# Patient Record
Sex: Female | Born: 1955 | Race: Black or African American | Hispanic: No | Marital: Single | Smoking: Never smoker
Health system: Southern US, Community
[De-identification: ages and names within clinical notes are randomized; demographics above are authoritative.]

## PROBLEM LIST (undated history)

## (undated) DIAGNOSIS — I10 Essential (primary) hypertension: Secondary | ICD-10-CM

## (undated) DIAGNOSIS — E669 Obesity, unspecified: Secondary | ICD-10-CM

## (undated) DIAGNOSIS — M1990 Unspecified osteoarthritis, unspecified site: Secondary | ICD-10-CM

## (undated) DIAGNOSIS — R519 Headache, unspecified: Secondary | ICD-10-CM

## (undated) DIAGNOSIS — M48061 Spinal stenosis, lumbar region without neurogenic claudication: Secondary | ICD-10-CM

## (undated) DIAGNOSIS — F419 Anxiety disorder, unspecified: Secondary | ICD-10-CM

## (undated) DIAGNOSIS — J449 Chronic obstructive pulmonary disease, unspecified: Secondary | ICD-10-CM

## (undated) DIAGNOSIS — F329 Major depressive disorder, single episode, unspecified: Secondary | ICD-10-CM

## (undated) DIAGNOSIS — Z973 Presence of spectacles and contact lenses: Secondary | ICD-10-CM

## (undated) DIAGNOSIS — M4316 Spondylolisthesis, lumbar region: Secondary | ICD-10-CM

## (undated) DIAGNOSIS — F32A Depression, unspecified: Secondary | ICD-10-CM

## (undated) DIAGNOSIS — I2510 Atherosclerotic heart disease of native coronary artery without angina pectoris: Secondary | ICD-10-CM

## (undated) DIAGNOSIS — R42 Dizziness and giddiness: Secondary | ICD-10-CM

## (undated) DIAGNOSIS — R51 Headache: Secondary | ICD-10-CM

## (undated) HISTORY — PX: COLONOSCOPY W/ BIOPSIES AND POLYPECTOMY: SHX1376

## (undated) HISTORY — PX: CARDIAC CATHETERIZATION: SHX172

## (undated) HISTORY — DX: Obesity, unspecified: E66.9

## (undated) HISTORY — PX: MULTIPLE TOOTH EXTRACTIONS: SHX2053

## (undated) HISTORY — PX: TUBAL LIGATION: SHX77

---

## 1998-04-09 ENCOUNTER — Encounter: Admission: RE | Admit: 1998-04-09 | Discharge: 1998-04-09 | Payer: Self-pay | Admitting: Internal Medicine

## 1998-04-28 ENCOUNTER — Emergency Department (HOSPITAL_COMMUNITY): Admission: EM | Admit: 1998-04-28 | Discharge: 1998-04-28 | Payer: Self-pay | Admitting: Emergency Medicine

## 1998-05-15 ENCOUNTER — Encounter: Admission: RE | Admit: 1998-05-15 | Discharge: 1998-05-15 | Payer: Self-pay | Admitting: Internal Medicine

## 1998-05-22 ENCOUNTER — Emergency Department (HOSPITAL_COMMUNITY): Admission: EM | Admit: 1998-05-22 | Discharge: 1998-05-22 | Payer: Self-pay | Admitting: Emergency Medicine

## 1998-06-26 ENCOUNTER — Encounter: Admission: RE | Admit: 1998-06-26 | Discharge: 1998-06-26 | Payer: Self-pay | Admitting: Hematology and Oncology

## 1998-08-18 ENCOUNTER — Emergency Department (HOSPITAL_COMMUNITY): Admission: EM | Admit: 1998-08-18 | Discharge: 1998-08-18 | Payer: Self-pay | Admitting: Emergency Medicine

## 1998-08-20 ENCOUNTER — Encounter: Admission: RE | Admit: 1998-08-20 | Discharge: 1998-08-20 | Payer: Self-pay | Admitting: Hematology and Oncology

## 1998-08-29 ENCOUNTER — Encounter: Admission: RE | Admit: 1998-08-29 | Discharge: 1998-08-29 | Payer: Self-pay | Admitting: Hematology and Oncology

## 1998-10-01 ENCOUNTER — Encounter: Admission: RE | Admit: 1998-10-01 | Discharge: 1998-10-01 | Payer: Self-pay | Admitting: Internal Medicine

## 1998-10-09 ENCOUNTER — Encounter: Admission: RE | Admit: 1998-10-09 | Discharge: 1998-10-09 | Payer: Self-pay | Admitting: Hematology and Oncology

## 1998-11-14 ENCOUNTER — Encounter: Admission: RE | Admit: 1998-11-14 | Discharge: 1998-11-14 | Payer: Self-pay | Admitting: Internal Medicine

## 1998-11-21 ENCOUNTER — Encounter: Admission: RE | Admit: 1998-11-21 | Discharge: 1998-11-21 | Payer: Self-pay | Admitting: Internal Medicine

## 1998-11-26 ENCOUNTER — Ambulatory Visit (HOSPITAL_COMMUNITY): Admission: RE | Admit: 1998-11-26 | Discharge: 1998-11-26 | Payer: Self-pay | Admitting: *Deleted

## 1998-11-26 ENCOUNTER — Encounter: Admission: RE | Admit: 1998-11-26 | Discharge: 1998-11-26 | Payer: Self-pay | Admitting: Internal Medicine

## 1998-12-18 ENCOUNTER — Emergency Department (HOSPITAL_COMMUNITY): Admission: EM | Admit: 1998-12-18 | Discharge: 1998-12-19 | Payer: Self-pay | Admitting: Emergency Medicine

## 1998-12-19 ENCOUNTER — Encounter: Payer: Self-pay | Admitting: Emergency Medicine

## 1999-02-28 ENCOUNTER — Encounter: Admission: RE | Admit: 1999-02-28 | Discharge: 1999-02-28 | Payer: Self-pay | Admitting: Internal Medicine

## 1999-05-14 ENCOUNTER — Encounter: Admission: RE | Admit: 1999-05-14 | Discharge: 1999-05-14 | Payer: Self-pay | Admitting: Internal Medicine

## 1999-06-01 ENCOUNTER — Emergency Department (HOSPITAL_COMMUNITY): Admission: EM | Admit: 1999-06-01 | Discharge: 1999-06-01 | Payer: Self-pay | Admitting: Emergency Medicine

## 1999-06-04 ENCOUNTER — Encounter: Admission: RE | Admit: 1999-06-04 | Discharge: 1999-06-04 | Payer: Self-pay | Admitting: Hematology and Oncology

## 1999-10-17 ENCOUNTER — Encounter: Admission: RE | Admit: 1999-10-17 | Discharge: 1999-10-17 | Payer: Self-pay | Admitting: Internal Medicine

## 1999-11-11 ENCOUNTER — Encounter: Payer: Self-pay | Admitting: Emergency Medicine

## 1999-11-11 ENCOUNTER — Emergency Department (HOSPITAL_COMMUNITY): Admission: EM | Admit: 1999-11-11 | Discharge: 1999-11-11 | Payer: Self-pay | Admitting: Emergency Medicine

## 2000-05-07 ENCOUNTER — Ambulatory Visit (HOSPITAL_COMMUNITY): Admission: RE | Admit: 2000-05-07 | Discharge: 2000-05-07 | Payer: Self-pay | Admitting: Hematology and Oncology

## 2000-05-07 ENCOUNTER — Encounter: Admission: RE | Admit: 2000-05-07 | Discharge: 2000-05-07 | Payer: Self-pay | Admitting: Internal Medicine

## 2000-05-28 ENCOUNTER — Ambulatory Visit (HOSPITAL_COMMUNITY): Admission: RE | Admit: 2000-05-28 | Discharge: 2000-05-28 | Payer: Self-pay | Admitting: Internal Medicine

## 2000-05-28 ENCOUNTER — Encounter: Admission: RE | Admit: 2000-05-28 | Discharge: 2000-05-28 | Payer: Self-pay | Admitting: Internal Medicine

## 2000-06-06 ENCOUNTER — Emergency Department (HOSPITAL_COMMUNITY): Admission: EM | Admit: 2000-06-06 | Discharge: 2000-06-06 | Payer: Self-pay | Admitting: Emergency Medicine

## 2000-06-10 ENCOUNTER — Encounter: Admission: RE | Admit: 2000-06-10 | Discharge: 2000-06-10 | Payer: Self-pay | Admitting: Internal Medicine

## 2000-07-20 ENCOUNTER — Encounter: Admission: RE | Admit: 2000-07-20 | Discharge: 2000-07-20 | Payer: Self-pay | Admitting: Internal Medicine

## 2001-01-26 ENCOUNTER — Encounter: Admission: RE | Admit: 2001-01-26 | Discharge: 2001-01-26 | Payer: Self-pay | Admitting: Hematology and Oncology

## 2001-08-20 ENCOUNTER — Encounter: Admission: RE | Admit: 2001-08-20 | Discharge: 2001-08-20 | Payer: Self-pay | Admitting: Internal Medicine

## 2002-02-04 ENCOUNTER — Ambulatory Visit (HOSPITAL_COMMUNITY): Admission: RE | Admit: 2002-02-04 | Discharge: 2002-02-04 | Payer: Self-pay | Admitting: *Deleted

## 2002-02-04 ENCOUNTER — Encounter: Admission: RE | Admit: 2002-02-04 | Discharge: 2002-02-04 | Payer: Self-pay | Admitting: Internal Medicine

## 2002-08-26 ENCOUNTER — Encounter: Admission: RE | Admit: 2002-08-26 | Discharge: 2002-08-26 | Payer: Self-pay | Admitting: Internal Medicine

## 2002-09-21 ENCOUNTER — Encounter: Admission: RE | Admit: 2002-09-21 | Discharge: 2002-09-21 | Payer: Self-pay | Admitting: Internal Medicine

## 2003-01-06 ENCOUNTER — Encounter: Admission: RE | Admit: 2003-01-06 | Discharge: 2003-01-06 | Payer: Self-pay | Admitting: Internal Medicine

## 2003-03-15 ENCOUNTER — Encounter: Admission: RE | Admit: 2003-03-15 | Discharge: 2003-03-15 | Payer: Self-pay | Admitting: Internal Medicine

## 2003-06-01 ENCOUNTER — Encounter: Payer: Self-pay | Admitting: Internal Medicine

## 2003-06-01 ENCOUNTER — Ambulatory Visit (HOSPITAL_COMMUNITY): Admission: RE | Admit: 2003-06-01 | Discharge: 2003-06-01 | Payer: Self-pay | Admitting: Internal Medicine

## 2003-06-01 ENCOUNTER — Encounter: Admission: RE | Admit: 2003-06-01 | Discharge: 2003-06-01 | Payer: Self-pay | Admitting: Internal Medicine

## 2003-07-31 ENCOUNTER — Encounter: Admission: RE | Admit: 2003-07-31 | Discharge: 2003-07-31 | Payer: Self-pay | Admitting: Infectious Diseases

## 2003-09-22 ENCOUNTER — Emergency Department (HOSPITAL_COMMUNITY): Admission: EM | Admit: 2003-09-22 | Discharge: 2003-09-22 | Payer: Self-pay | Admitting: Emergency Medicine

## 2003-09-29 ENCOUNTER — Emergency Department (HOSPITAL_COMMUNITY): Admission: EM | Admit: 2003-09-29 | Discharge: 2003-09-30 | Payer: Self-pay | Admitting: Emergency Medicine

## 2003-09-30 ENCOUNTER — Encounter: Payer: Self-pay | Admitting: Emergency Medicine

## 2003-10-04 ENCOUNTER — Encounter: Admission: RE | Admit: 2003-10-04 | Discharge: 2003-10-04 | Payer: Self-pay | Admitting: Internal Medicine

## 2003-10-06 ENCOUNTER — Encounter: Admission: RE | Admit: 2003-10-06 | Discharge: 2003-10-06 | Payer: Self-pay | Admitting: Internal Medicine

## 2003-10-14 ENCOUNTER — Encounter: Payer: Self-pay | Admitting: Internal Medicine

## 2003-10-14 ENCOUNTER — Ambulatory Visit (HOSPITAL_COMMUNITY): Admission: RE | Admit: 2003-10-14 | Discharge: 2003-10-14 | Payer: Self-pay | Admitting: Internal Medicine

## 2003-10-26 ENCOUNTER — Encounter: Admission: RE | Admit: 2003-10-26 | Discharge: 2003-10-26 | Payer: Self-pay | Admitting: Internal Medicine

## 2003-11-27 ENCOUNTER — Encounter: Admission: RE | Admit: 2003-11-27 | Discharge: 2003-11-27 | Payer: Self-pay | Admitting: Internal Medicine

## 2003-12-18 ENCOUNTER — Encounter: Admission: RE | Admit: 2003-12-18 | Discharge: 2003-12-18 | Payer: Self-pay | Admitting: Internal Medicine

## 2004-01-26 ENCOUNTER — Ambulatory Visit (HOSPITAL_COMMUNITY): Admission: RE | Admit: 2004-01-26 | Discharge: 2004-01-26 | Payer: Self-pay | Admitting: Neurology

## 2004-04-07 ENCOUNTER — Emergency Department (HOSPITAL_COMMUNITY): Admission: EM | Admit: 2004-04-07 | Discharge: 2004-04-07 | Payer: Self-pay | Admitting: Emergency Medicine

## 2004-08-01 ENCOUNTER — Encounter: Admission: RE | Admit: 2004-08-01 | Discharge: 2004-08-01 | Payer: Self-pay | Admitting: Internal Medicine

## 2004-08-01 ENCOUNTER — Encounter (INDEPENDENT_AMBULATORY_CARE_PROVIDER_SITE_OTHER): Payer: Self-pay | Admitting: Internal Medicine

## 2005-01-15 ENCOUNTER — Ambulatory Visit: Payer: Self-pay | Admitting: Internal Medicine

## 2005-01-16 ENCOUNTER — Encounter (INDEPENDENT_AMBULATORY_CARE_PROVIDER_SITE_OTHER): Payer: Self-pay | Admitting: Internal Medicine

## 2005-01-16 LAB — CONVERTED CEMR LAB: Microalbumin U total vol: 0.94 mg/L

## 2005-04-18 ENCOUNTER — Emergency Department (HOSPITAL_COMMUNITY): Admission: EM | Admit: 2005-04-18 | Discharge: 2005-04-18 | Payer: Self-pay | Admitting: Emergency Medicine

## 2005-05-27 ENCOUNTER — Ambulatory Visit: Payer: Self-pay | Admitting: Internal Medicine

## 2005-06-12 ENCOUNTER — Ambulatory Visit: Payer: Self-pay | Admitting: Internal Medicine

## 2005-07-07 ENCOUNTER — Ambulatory Visit: Payer: Self-pay

## 2005-07-25 ENCOUNTER — Ambulatory Visit: Payer: Self-pay | Admitting: Internal Medicine

## 2005-08-07 ENCOUNTER — Ambulatory Visit: Payer: Self-pay | Admitting: Internal Medicine

## 2005-09-10 ENCOUNTER — Ambulatory Visit (HOSPITAL_COMMUNITY): Admission: RE | Admit: 2005-09-10 | Discharge: 2005-09-10 | Payer: Self-pay | Admitting: Internal Medicine

## 2006-03-13 ENCOUNTER — Ambulatory Visit: Payer: Self-pay | Admitting: Internal Medicine

## 2006-04-10 ENCOUNTER — Ambulatory Visit: Payer: Self-pay | Admitting: Internal Medicine

## 2006-07-08 ENCOUNTER — Emergency Department (HOSPITAL_COMMUNITY): Admission: EM | Admit: 2006-07-08 | Discharge: 2006-07-09 | Payer: Self-pay | Admitting: Emergency Medicine

## 2006-07-15 ENCOUNTER — Ambulatory Visit: Payer: Self-pay | Admitting: Hospitalist

## 2006-08-26 ENCOUNTER — Emergency Department (HOSPITAL_COMMUNITY): Admission: EM | Admit: 2006-08-26 | Discharge: 2006-08-27 | Payer: Self-pay | Admitting: Emergency Medicine

## 2006-10-12 ENCOUNTER — Encounter (INDEPENDENT_AMBULATORY_CARE_PROVIDER_SITE_OTHER): Payer: Self-pay | Admitting: Internal Medicine

## 2006-10-12 DIAGNOSIS — F411 Generalized anxiety disorder: Secondary | ICD-10-CM | POA: Insufficient documentation

## 2006-10-12 DIAGNOSIS — G4700 Insomnia, unspecified: Secondary | ICD-10-CM | POA: Insufficient documentation

## 2006-10-12 DIAGNOSIS — H8110 Benign paroxysmal vertigo, unspecified ear: Secondary | ICD-10-CM | POA: Insufficient documentation

## 2006-10-12 DIAGNOSIS — K029 Dental caries, unspecified: Secondary | ICD-10-CM | POA: Insufficient documentation

## 2006-10-12 DIAGNOSIS — I10 Essential (primary) hypertension: Secondary | ICD-10-CM | POA: Insufficient documentation

## 2006-11-27 ENCOUNTER — Ambulatory Visit: Payer: Self-pay | Admitting: Internal Medicine

## 2006-11-30 ENCOUNTER — Encounter: Payer: Self-pay | Admitting: Internal Medicine

## 2006-11-30 ENCOUNTER — Ambulatory Visit: Payer: Self-pay | Admitting: Internal Medicine

## 2006-12-01 ENCOUNTER — Encounter (INDEPENDENT_AMBULATORY_CARE_PROVIDER_SITE_OTHER): Payer: Self-pay | Admitting: Internal Medicine

## 2006-12-01 LAB — CONVERTED CEMR LAB: TSH: 2.163 microintl units/mL

## 2007-01-13 ENCOUNTER — Emergency Department (HOSPITAL_COMMUNITY): Admission: EM | Admit: 2007-01-13 | Discharge: 2007-01-14 | Payer: Self-pay | Admitting: Emergency Medicine

## 2007-01-22 ENCOUNTER — Telehealth: Payer: Self-pay | Admitting: *Deleted

## 2007-01-23 LAB — CONVERTED CEMR LAB
ALT: 13 units/L (ref 0–35)
AST: 21 units/L (ref 0–37)
Albumin: 4.5 g/dL (ref 3.5–5.2)
Alkaline Phosphatase: 89 units/L (ref 39–117)
BUN: 11 mg/dL (ref 6–23)
Basophils Absolute: 0 10*3/uL (ref 0.0–0.1)
Basophils Relative: 1 % (ref 0–1)
CO2: 24 meq/L (ref 19–32)
Calcium: 9.8 mg/dL (ref 8.4–10.5)
Chloride: 100 meq/L (ref 96–112)
Creatinine, Ser: 0.88 mg/dL (ref 0.40–1.20)
Eosinophils Relative: 2 % (ref 0–4)
Glucose, Bld: 81 mg/dL (ref 70–99)
HCT: 38.9 % (ref 34.4–43.3)
Hemoglobin: 13 g/dL (ref 11.7–14.8)
Lymphocytes Relative: 37 % (ref 15–43)
Lymphs Abs: 1.6 10*3/uL (ref 0.8–3.1)
MCHC: 33.4 g/dL (ref 33.1–35.4)
MCV: 84.4 fL (ref 78.8–100.0)
Monocytes Absolute: 0.4 10*3/uL (ref 0.2–0.7)
Monocytes Relative: 9 % (ref 3–11)
Neutro Abs: 2.3 10*3/uL (ref 1.8–6.8)
Neutrophils Relative %: 52 % (ref 47–77)
Platelets: 318 10*3/uL (ref 152–374)
Potassium: 3.4 meq/L — ABNORMAL LOW (ref 3.5–5.3)
RBC: 4.61 M/uL (ref 3.79–4.96)
RDW: 14 % (ref 11.5–15.3)
Sodium: 137 meq/L (ref 135–145)
TSH: 2.163 microintl units/mL (ref 0.350–5.50)
Total Bilirubin: 0.4 mg/dL (ref 0.3–1.2)
Total Protein: 8.4 g/dL — ABNORMAL HIGH (ref 6.0–8.3)
WBC: 4.4 10*3/uL (ref 3.7–10.0)

## 2007-02-27 ENCOUNTER — Inpatient Hospital Stay (HOSPITAL_COMMUNITY): Admission: EM | Admit: 2007-02-27 | Discharge: 2007-03-02 | Payer: Self-pay | Admitting: Emergency Medicine

## 2007-02-27 ENCOUNTER — Ambulatory Visit: Payer: Self-pay | Admitting: Infectious Diseases

## 2007-02-27 DIAGNOSIS — I2510 Atherosclerotic heart disease of native coronary artery without angina pectoris: Secondary | ICD-10-CM | POA: Insufficient documentation

## 2007-03-01 ENCOUNTER — Ambulatory Visit: Payer: Self-pay | Admitting: Vascular Surgery

## 2007-03-02 ENCOUNTER — Encounter: Payer: Self-pay | Admitting: Internal Medicine

## 2007-03-02 ENCOUNTER — Telehealth: Payer: Self-pay | Admitting: *Deleted

## 2007-03-09 ENCOUNTER — Emergency Department (HOSPITAL_COMMUNITY): Admission: EM | Admit: 2007-03-09 | Discharge: 2007-03-09 | Payer: Self-pay | Admitting: Emergency Medicine

## 2007-03-10 ENCOUNTER — Ambulatory Visit: Payer: Self-pay | Admitting: *Deleted

## 2007-03-10 ENCOUNTER — Encounter (INDEPENDENT_AMBULATORY_CARE_PROVIDER_SITE_OTHER): Payer: Self-pay | Admitting: *Deleted

## 2007-03-10 DIAGNOSIS — E785 Hyperlipidemia, unspecified: Secondary | ICD-10-CM | POA: Insufficient documentation

## 2007-03-14 ENCOUNTER — Emergency Department (HOSPITAL_COMMUNITY): Admission: EM | Admit: 2007-03-14 | Discharge: 2007-03-15 | Payer: Self-pay | Admitting: Emergency Medicine

## 2007-03-22 ENCOUNTER — Telehealth: Payer: Self-pay | Admitting: *Deleted

## 2007-04-15 ENCOUNTER — Telehealth (INDEPENDENT_AMBULATORY_CARE_PROVIDER_SITE_OTHER): Payer: Self-pay | Admitting: Internal Medicine

## 2007-05-09 ENCOUNTER — Emergency Department (HOSPITAL_COMMUNITY): Admission: EM | Admit: 2007-05-09 | Discharge: 2007-05-09 | Payer: Self-pay | Admitting: Emergency Medicine

## 2007-05-11 ENCOUNTER — Encounter (INDEPENDENT_AMBULATORY_CARE_PROVIDER_SITE_OTHER): Payer: Self-pay | Admitting: Internal Medicine

## 2007-05-11 ENCOUNTER — Ambulatory Visit: Payer: Self-pay | Admitting: Internal Medicine

## 2007-05-13 LAB — CONVERTED CEMR LAB
ALT: 13 units/L (ref 0–35)
AST: 22 units/L (ref 0–37)
Albumin: 4.6 g/dL (ref 3.5–5.2)
Alkaline Phosphatase: 80 units/L (ref 39–117)
BUN: 7 mg/dL (ref 6–23)
Basophils Absolute: 0 10*3/uL (ref 0.0–0.1)
Basophils Relative: 1 % (ref 0–1)
Bilirubin Urine: NEGATIVE
CO2: 22 meq/L (ref 19–32)
Calcium: 10 mg/dL (ref 8.4–10.5)
Chloride: 104 meq/L (ref 96–112)
Creatinine, Ser: 0.82 mg/dL (ref 0.40–1.20)
Eosinophils Absolute: 0.1 10*3/uL (ref 0.0–0.7)
Eosinophils Relative: 1 % (ref 0–5)
Free T4: 0.85 ng/dL — ABNORMAL LOW (ref 0.89–1.80)
Glucose, Bld: 83 mg/dL (ref 70–99)
HCT: 41 % (ref 36.0–46.0)
Hemoglobin, Urine: NEGATIVE
Hemoglobin: 13.3 g/dL (ref 12.0–15.0)
Ketones, ur: NEGATIVE mg/dL
Lymphocytes Relative: 40 % (ref 12–46)
Lymphs Abs: 1.8 10*3/uL (ref 0.7–3.3)
MCHC: 32.4 g/dL (ref 30.0–36.0)
MCV: 82.7 fL (ref 78.0–100.0)
Monocytes Absolute: 0.4 10*3/uL (ref 0.2–0.7)
Monocytes Relative: 9 % (ref 3–11)
Neutro Abs: 2.2 10*3/uL (ref 1.7–7.7)
Neutrophils Relative %: 49 % (ref 43–77)
Nitrite: NEGATIVE
Platelets: 317 10*3/uL (ref 150–400)
Potassium: 3.5 meq/L (ref 3.5–5.3)
Protein, ur: NEGATIVE mg/dL
RBC / HPF: NONE SEEN (ref <3)
RBC: 4.96 M/uL (ref 3.87–5.11)
RDW: 13.9 % (ref 11.5–14.0)
Sodium: 139 meq/L (ref 135–145)
Specific Gravity, Urine: 1.011 (ref 1.005–1.03)
TSH: 1.777 microintl units/mL (ref 0.350–5.50)
Total Bilirubin: 0.4 mg/dL (ref 0.3–1.2)
Total Protein: 8.9 g/dL — ABNORMAL HIGH (ref 6.0–8.3)
Urine Glucose: NEGATIVE mg/dL
Urobilinogen, UA: 1 (ref 0.0–1.0)
WBC: 4.4 10*3/uL (ref 4.0–10.5)
pH: 6 (ref 5.0–8.0)

## 2007-05-14 ENCOUNTER — Encounter (INDEPENDENT_AMBULATORY_CARE_PROVIDER_SITE_OTHER): Payer: Self-pay | Admitting: Internal Medicine

## 2007-05-20 ENCOUNTER — Telehealth (INDEPENDENT_AMBULATORY_CARE_PROVIDER_SITE_OTHER): Payer: Self-pay | Admitting: Internal Medicine

## 2007-05-22 ENCOUNTER — Emergency Department (HOSPITAL_COMMUNITY): Admission: EM | Admit: 2007-05-22 | Discharge: 2007-05-23 | Payer: Self-pay | Admitting: Emergency Medicine

## 2007-05-26 ENCOUNTER — Emergency Department (HOSPITAL_COMMUNITY): Admission: EM | Admit: 2007-05-26 | Discharge: 2007-05-26 | Payer: Self-pay | Admitting: Family Medicine

## 2007-05-26 ENCOUNTER — Telehealth: Payer: Self-pay | Admitting: *Deleted

## 2007-05-27 ENCOUNTER — Ambulatory Visit: Payer: Self-pay | Admitting: Internal Medicine

## 2007-05-27 DIAGNOSIS — G43909 Migraine, unspecified, not intractable, without status migrainosus: Secondary | ICD-10-CM | POA: Insufficient documentation

## 2007-06-09 ENCOUNTER — Telehealth (INDEPENDENT_AMBULATORY_CARE_PROVIDER_SITE_OTHER): Payer: Self-pay | Admitting: *Deleted

## 2007-06-10 ENCOUNTER — Emergency Department (HOSPITAL_COMMUNITY): Admission: EM | Admit: 2007-06-10 | Discharge: 2007-06-10 | Payer: Self-pay | Admitting: Family Medicine

## 2007-06-11 ENCOUNTER — Ambulatory Visit: Payer: Self-pay | Admitting: Internal Medicine

## 2007-06-11 ENCOUNTER — Encounter (INDEPENDENT_AMBULATORY_CARE_PROVIDER_SITE_OTHER): Payer: Self-pay | Admitting: Internal Medicine

## 2007-06-14 LAB — CONVERTED CEMR LAB
BUN: 9 mg/dL (ref 6–23)
CO2: 26 meq/L (ref 19–32)
Calcium: 9.9 mg/dL (ref 8.4–10.5)
Chloride: 100 meq/L (ref 96–112)
Creatinine, Ser: 0.82 mg/dL (ref 0.40–1.20)
Glucose, Bld: 120 mg/dL — ABNORMAL HIGH (ref 70–99)
Potassium: 3.3 meq/L — ABNORMAL LOW (ref 3.5–5.3)
Sodium: 138 meq/L (ref 135–145)

## 2007-06-15 ENCOUNTER — Telehealth: Payer: Self-pay | Admitting: *Deleted

## 2007-06-21 ENCOUNTER — Telehealth: Payer: Self-pay | Admitting: *Deleted

## 2007-06-26 ENCOUNTER — Telehealth: Payer: Self-pay | Admitting: *Deleted

## 2007-07-06 ENCOUNTER — Telehealth: Payer: Self-pay | Admitting: *Deleted

## 2007-07-07 ENCOUNTER — Ambulatory Visit: Payer: Self-pay | Admitting: *Deleted

## 2007-07-07 ENCOUNTER — Encounter (INDEPENDENT_AMBULATORY_CARE_PROVIDER_SITE_OTHER): Payer: Self-pay | Admitting: Internal Medicine

## 2007-07-07 LAB — CONVERTED CEMR LAB
Bacteria, UA: NONE SEEN
Bilirubin Urine: NEGATIVE
Bilirubin Urine: NEGATIVE
Blood in Urine, dipstick: NEGATIVE
Candida species: NEGATIVE
Chlamydia, DNA Probe: NEGATIVE
GC Probe Amp, Genital: NEGATIVE
Gardnerella vaginalis: POSITIVE — AB
Glucose, Urine, Semiquant: NEGATIVE
Hemoglobin, Urine: NEGATIVE
Ketones, ur: NEGATIVE mg/dL
Ketones, urine, test strip: NEGATIVE
Nitrite: NEGATIVE
Nitrite: NEGATIVE
Protein, U semiquant: NEGATIVE
Protein, ur: NEGATIVE mg/dL
RBC / HPF: NONE SEEN (ref <3)
Specific Gravity, Urine: 1.01
Specific Gravity, Urine: 1.007 (ref 1.005–1.03)
Trichomonal Vaginitis: NEGATIVE
Urine Glucose: NEGATIVE mg/dL
Urobilinogen, UA: 0.2
Urobilinogen, UA: 0.2 (ref 0.0–1.0)
pH: 5
pH: 6 (ref 5.0–8.0)

## 2007-07-08 ENCOUNTER — Encounter (INDEPENDENT_AMBULATORY_CARE_PROVIDER_SITE_OTHER): Payer: Self-pay | Admitting: Internal Medicine

## 2007-07-08 ENCOUNTER — Telehealth (INDEPENDENT_AMBULATORY_CARE_PROVIDER_SITE_OTHER): Payer: Self-pay | Admitting: Internal Medicine

## 2007-07-08 LAB — CONVERTED CEMR LAB

## 2007-07-12 ENCOUNTER — Telehealth (INDEPENDENT_AMBULATORY_CARE_PROVIDER_SITE_OTHER): Payer: Self-pay | Admitting: Internal Medicine

## 2007-07-15 ENCOUNTER — Telehealth (INDEPENDENT_AMBULATORY_CARE_PROVIDER_SITE_OTHER): Payer: Self-pay | Admitting: *Deleted

## 2007-07-16 ENCOUNTER — Telehealth: Payer: Self-pay | Admitting: Internal Medicine

## 2007-08-20 ENCOUNTER — Telehealth (INDEPENDENT_AMBULATORY_CARE_PROVIDER_SITE_OTHER): Payer: Self-pay | Admitting: *Deleted

## 2007-09-16 ENCOUNTER — Telehealth (INDEPENDENT_AMBULATORY_CARE_PROVIDER_SITE_OTHER): Payer: Self-pay | Admitting: *Deleted

## 2007-10-18 ENCOUNTER — Telehealth: Payer: Self-pay | Admitting: *Deleted

## 2007-10-19 ENCOUNTER — Encounter (INDEPENDENT_AMBULATORY_CARE_PROVIDER_SITE_OTHER): Payer: Self-pay | Admitting: Internal Medicine

## 2007-10-19 ENCOUNTER — Ambulatory Visit: Payer: Self-pay | Admitting: Internal Medicine

## 2007-10-19 LAB — CONVERTED CEMR LAB
ALT: 13 units/L (ref 0–35)
AST: 22 units/L (ref 0–37)
Albumin: 4.4 g/dL (ref 3.5–5.2)
Alkaline Phosphatase: 73 units/L (ref 39–117)
BUN: 12 mg/dL (ref 6–23)
CO2: 24 meq/L (ref 19–32)
Calcium: 9.8 mg/dL (ref 8.4–10.5)
Chloride: 102 meq/L (ref 96–112)
Cholesterol: 220 mg/dL — ABNORMAL HIGH (ref 0–200)
Creatinine, Ser: 0.95 mg/dL (ref 0.40–1.20)
Glucose, Bld: 91 mg/dL (ref 70–99)
HDL: 40 mg/dL (ref >39)
LDL Cholesterol: 113 mg/dL — ABNORMAL HIGH (ref 0–99)
Potassium: 3.6 meq/L (ref 3.5–5.3)
Sodium: 140 meq/L (ref 135–145)
Total Bilirubin: 0.3 mg/dL (ref 0.3–1.2)
Total CHOL/HDL Ratio: 5.5
Total CK: 72 units/L (ref 7–177)
Total Protein: 8.4 g/dL — ABNORMAL HIGH (ref 6.0–8.3)
Triglycerides: 334 mg/dL — ABNORMAL HIGH (ref <150)
VLDL: 67 mg/dL — ABNORMAL HIGH (ref 0–40)

## 2007-10-24 ENCOUNTER — Encounter (INDEPENDENT_AMBULATORY_CARE_PROVIDER_SITE_OTHER): Payer: Self-pay | Admitting: Internal Medicine

## 2007-10-26 ENCOUNTER — Telehealth (INDEPENDENT_AMBULATORY_CARE_PROVIDER_SITE_OTHER): Payer: Self-pay | Admitting: Internal Medicine

## 2007-10-27 ENCOUNTER — Telehealth: Payer: Self-pay | Admitting: *Deleted

## 2007-11-15 ENCOUNTER — Ambulatory Visit: Payer: Self-pay | Admitting: Internal Medicine

## 2007-11-16 ENCOUNTER — Encounter: Payer: Self-pay | Admitting: Internal Medicine

## 2007-11-17 ENCOUNTER — Inpatient Hospital Stay (HOSPITAL_COMMUNITY): Admission: EM | Admit: 2007-11-17 | Discharge: 2007-11-18 | Payer: Self-pay | Admitting: Emergency Medicine

## 2007-11-18 ENCOUNTER — Encounter (INDEPENDENT_AMBULATORY_CARE_PROVIDER_SITE_OTHER): Payer: Self-pay | Admitting: Internal Medicine

## 2007-11-23 ENCOUNTER — Telehealth (INDEPENDENT_AMBULATORY_CARE_PROVIDER_SITE_OTHER): Payer: Self-pay | Admitting: *Deleted

## 2007-11-26 ENCOUNTER — Telehealth (INDEPENDENT_AMBULATORY_CARE_PROVIDER_SITE_OTHER): Payer: Self-pay | Admitting: Internal Medicine

## 2007-11-30 ENCOUNTER — Ambulatory Visit (HOSPITAL_COMMUNITY): Admission: RE | Admit: 2007-11-30 | Discharge: 2007-11-30 | Payer: Self-pay | Admitting: Cardiology

## 2007-11-30 ENCOUNTER — Telehealth (INDEPENDENT_AMBULATORY_CARE_PROVIDER_SITE_OTHER): Payer: Self-pay | Admitting: Internal Medicine

## 2007-12-18 ENCOUNTER — Telehealth: Payer: Self-pay | Admitting: Internal Medicine

## 2008-01-01 ENCOUNTER — Emergency Department (HOSPITAL_COMMUNITY): Admission: EM | Admit: 2008-01-01 | Discharge: 2008-01-01 | Payer: Self-pay | Admitting: Emergency Medicine

## 2008-01-07 ENCOUNTER — Ambulatory Visit: Payer: Self-pay | Admitting: Internal Medicine

## 2008-01-10 ENCOUNTER — Telehealth: Payer: Self-pay | Admitting: Licensed Clinical Social Worker

## 2008-01-19 ENCOUNTER — Telehealth: Payer: Self-pay | Admitting: Internal Medicine

## 2008-01-22 ENCOUNTER — Emergency Department (HOSPITAL_COMMUNITY): Admission: EM | Admit: 2008-01-22 | Discharge: 2008-01-22 | Payer: Self-pay | Admitting: Emergency Medicine

## 2008-02-03 ENCOUNTER — Emergency Department (HOSPITAL_COMMUNITY): Admission: EM | Admit: 2008-02-03 | Discharge: 2008-02-03 | Payer: Self-pay | Admitting: Family Medicine

## 2008-02-13 ENCOUNTER — Emergency Department (HOSPITAL_COMMUNITY): Admission: EM | Admit: 2008-02-13 | Discharge: 2008-02-14 | Payer: Self-pay | Admitting: Emergency Medicine

## 2008-02-19 ENCOUNTER — Emergency Department (HOSPITAL_COMMUNITY): Admission: EM | Admit: 2008-02-19 | Discharge: 2008-02-20 | Payer: Self-pay | Admitting: Family Medicine

## 2008-03-05 ENCOUNTER — Emergency Department (HOSPITAL_COMMUNITY): Admission: EM | Admit: 2008-03-05 | Discharge: 2008-03-05 | Payer: Self-pay | Admitting: Emergency Medicine

## 2008-03-13 ENCOUNTER — Emergency Department (HOSPITAL_COMMUNITY): Admission: EM | Admit: 2008-03-13 | Discharge: 2008-03-14 | Payer: Self-pay | Admitting: Emergency Medicine

## 2008-03-15 ENCOUNTER — Telehealth (INDEPENDENT_AMBULATORY_CARE_PROVIDER_SITE_OTHER): Payer: Self-pay | Admitting: Internal Medicine

## 2008-03-19 ENCOUNTER — Telehealth: Payer: Self-pay | Admitting: *Deleted

## 2008-03-20 ENCOUNTER — Ambulatory Visit: Payer: Self-pay | Admitting: Internal Medicine

## 2008-03-20 ENCOUNTER — Encounter (INDEPENDENT_AMBULATORY_CARE_PROVIDER_SITE_OTHER): Payer: Self-pay | Admitting: Internal Medicine

## 2008-03-20 LAB — CONVERTED CEMR LAB
BUN: 9 mg/dL (ref 6–23)
CO2: 25 meq/L (ref 19–32)
Calcium: 9.6 mg/dL (ref 8.4–10.5)
Chloride: 104 meq/L (ref 96–112)
Creatinine, Ser: 0.98 mg/dL (ref 0.40–1.20)
Glucose, Bld: 98 mg/dL (ref 70–99)
Potassium: 3.5 meq/L (ref 3.5–5.3)
Sodium: 136 meq/L (ref 135–145)

## 2008-03-23 ENCOUNTER — Encounter (INDEPENDENT_AMBULATORY_CARE_PROVIDER_SITE_OTHER): Payer: Self-pay | Admitting: Internal Medicine

## 2008-03-23 ENCOUNTER — Ambulatory Visit: Payer: Self-pay | Admitting: Internal Medicine

## 2008-03-23 LAB — CONVERTED CEMR LAB
Bilirubin Urine: NEGATIVE
Bilirubin Urine: NEGATIVE
Blood in Urine, dipstick: NEGATIVE
Glucose, Urine, Semiquant: NEGATIVE
Hemoglobin, Urine: NEGATIVE
Ketones, ur: NEGATIVE mg/dL
Ketones, urine, test strip: NEGATIVE
Leukocytes, UA: NEGATIVE
Nitrite: NEGATIVE
Nitrite: NEGATIVE
Protein, U semiquant: NEGATIVE
Protein, ur: NEGATIVE mg/dL
Specific Gravity, Urine: <1.005
Specific Gravity, Urine: 1.006 (ref 1.005–1.03)
Urine Glucose: NEGATIVE mg/dL
Urobilinogen, UA: 0.2
Urobilinogen, UA: 0.2 (ref 0.0–1.0)
pH: 5
pH: 6.5 (ref 5.0–8.0)

## 2008-03-24 ENCOUNTER — Encounter (INDEPENDENT_AMBULATORY_CARE_PROVIDER_SITE_OTHER): Payer: Self-pay | Admitting: Internal Medicine

## 2008-03-27 ENCOUNTER — Telehealth (INDEPENDENT_AMBULATORY_CARE_PROVIDER_SITE_OTHER): Payer: Self-pay | Admitting: Internal Medicine

## 2008-03-28 ENCOUNTER — Telehealth: Payer: Self-pay | Admitting: *Deleted

## 2008-03-29 ENCOUNTER — Encounter (INDEPENDENT_AMBULATORY_CARE_PROVIDER_SITE_OTHER): Payer: Self-pay | Admitting: Internal Medicine

## 2008-03-29 ENCOUNTER — Ambulatory Visit: Payer: Self-pay | Admitting: Infectious Disease

## 2008-03-29 LAB — CONVERTED CEMR LAB
Chlamydia, DNA Probe: NEGATIVE
GC Probe Amp, Genital: NEGATIVE

## 2008-03-30 LAB — CONVERTED CEMR LAB
Candida species: NEGATIVE
Gardnerella vaginalis: POSITIVE — AB
Trichomonal Vaginitis: NEGATIVE

## 2008-04-04 ENCOUNTER — Telehealth: Payer: Self-pay | Admitting: Infectious Disease

## 2008-04-08 ENCOUNTER — Emergency Department (HOSPITAL_COMMUNITY): Admission: EM | Admit: 2008-04-08 | Discharge: 2008-04-08 | Payer: Self-pay | Admitting: Emergency Medicine

## 2008-04-08 ENCOUNTER — Telehealth (INDEPENDENT_AMBULATORY_CARE_PROVIDER_SITE_OTHER): Payer: Self-pay | Admitting: Internal Medicine

## 2008-04-11 ENCOUNTER — Emergency Department (HOSPITAL_COMMUNITY): Admission: EM | Admit: 2008-04-11 | Discharge: 2008-04-11 | Payer: Self-pay | Admitting: Emergency Medicine

## 2008-04-13 ENCOUNTER — Telehealth (INDEPENDENT_AMBULATORY_CARE_PROVIDER_SITE_OTHER): Payer: Self-pay | Admitting: Internal Medicine

## 2008-04-20 ENCOUNTER — Emergency Department (HOSPITAL_COMMUNITY): Admission: EM | Admit: 2008-04-20 | Discharge: 2008-04-20 | Payer: Self-pay | Admitting: Emergency Medicine

## 2008-04-20 ENCOUNTER — Telehealth (INDEPENDENT_AMBULATORY_CARE_PROVIDER_SITE_OTHER): Payer: Self-pay | Admitting: *Deleted

## 2008-04-20 ENCOUNTER — Telehealth: Payer: Self-pay | Admitting: *Deleted

## 2008-04-27 ENCOUNTER — Encounter (INDEPENDENT_AMBULATORY_CARE_PROVIDER_SITE_OTHER): Payer: Self-pay | Admitting: Cardiovascular Disease

## 2008-04-27 ENCOUNTER — Ambulatory Visit: Payer: Self-pay | Admitting: Vascular Surgery

## 2008-04-27 ENCOUNTER — Observation Stay (HOSPITAL_COMMUNITY): Admission: EM | Admit: 2008-04-27 | Discharge: 2008-04-28 | Payer: Self-pay | Admitting: Emergency Medicine

## 2008-05-03 ENCOUNTER — Ambulatory Visit: Payer: Self-pay | Admitting: Internal Medicine

## 2008-06-07 ENCOUNTER — Telehealth (INDEPENDENT_AMBULATORY_CARE_PROVIDER_SITE_OTHER): Payer: Self-pay | Admitting: Internal Medicine

## 2008-06-22 ENCOUNTER — Telehealth (INDEPENDENT_AMBULATORY_CARE_PROVIDER_SITE_OTHER): Payer: Self-pay | Admitting: Internal Medicine

## 2008-07-18 ENCOUNTER — Telehealth: Payer: Self-pay | Admitting: Internal Medicine

## 2008-07-20 ENCOUNTER — Telehealth (INDEPENDENT_AMBULATORY_CARE_PROVIDER_SITE_OTHER): Payer: Self-pay | Admitting: Internal Medicine

## 2008-07-20 ENCOUNTER — Encounter (INDEPENDENT_AMBULATORY_CARE_PROVIDER_SITE_OTHER): Payer: Self-pay | Admitting: Internal Medicine

## 2008-07-20 ENCOUNTER — Encounter: Payer: Self-pay | Admitting: Internal Medicine

## 2008-07-20 ENCOUNTER — Ambulatory Visit: Payer: Self-pay | Admitting: Internal Medicine

## 2008-07-20 LAB — CONVERTED CEMR LAB
Bilirubin Urine: NEGATIVE
Candida species: NEGATIVE
Chlamydia, DNA Probe: NEGATIVE
GC Probe Amp, Genital: NEGATIVE
Gardnerella vaginalis: POSITIVE — AB
Hemoglobin, Urine: NEGATIVE
Ketones, ur: NEGATIVE mg/dL
Leukocytes, UA: NEGATIVE
Nitrite: NEGATIVE
Protein, ur: NEGATIVE mg/dL
Sed Rate: 71 mm/hr — ABNORMAL HIGH (ref 0–22)
Specific Gravity, Urine: 1.014 (ref 1.005–1.03)
Trichomonal Vaginitis: NEGATIVE
Urine Glucose: NEGATIVE mg/dL
Urobilinogen, UA: 0.2 (ref 0.0–1.0)
pH: 5.5 (ref 5.0–8.0)

## 2008-07-21 ENCOUNTER — Telehealth: Payer: Self-pay | Admitting: *Deleted

## 2008-07-21 ENCOUNTER — Encounter: Payer: Self-pay | Admitting: Internal Medicine

## 2008-08-25 ENCOUNTER — Encounter (INDEPENDENT_AMBULATORY_CARE_PROVIDER_SITE_OTHER): Payer: Self-pay | Admitting: Internal Medicine

## 2008-08-25 ENCOUNTER — Ambulatory Visit: Payer: Self-pay | Admitting: Internal Medicine

## 2008-08-25 ENCOUNTER — Encounter: Payer: Self-pay | Admitting: Internal Medicine

## 2008-08-25 LAB — CONVERTED CEMR LAB
ALT: 12 units/L (ref 0–35)
AST: 24 units/L (ref 0–37)
Albumin: 4 g/dL (ref 3.5–5.2)
Alkaline Phosphatase: 58 units/L (ref 39–117)
BUN: 15 mg/dL (ref 6–23)
Basophils Absolute: 0 10*3/uL (ref 0.0–0.1)
Basophils Relative: 1 % (ref 0–1)
CO2: 26 meq/L (ref 19–32)
Calcium: 9.9 mg/dL (ref 8.4–10.5)
Chloride: 105 meq/L (ref 96–112)
Creatinine, Ser: 1.59 mg/dL — ABNORMAL HIGH (ref 0.40–1.20)
Eosinophils Absolute: 0.1 10*3/uL (ref 0.0–0.7)
Eosinophils Relative: 2 % (ref 0–5)
Glucose, Bld: 100 mg/dL — ABNORMAL HIGH (ref 70–99)
HCT: 32.8 % — ABNORMAL LOW (ref 36.0–46.0)
Hemoglobin: 11.1 g/dL — ABNORMAL LOW (ref 12.0–15.0)
INR: 1 (ref 0.0–1.5)
Lymphocytes Relative: 35 % (ref 12–46)
Lymphs Abs: 1.1 10*3/uL (ref 0.7–4.0)
MCHC: 33.8 g/dL (ref 30.0–36.0)
MCV: 83.3 fL (ref 78.0–100.0)
Monocytes Absolute: 0.3 10*3/uL (ref 0.1–1.0)
Monocytes Relative: 10 % (ref 3–12)
Neutro Abs: 1.6 10*3/uL — ABNORMAL LOW (ref 1.7–7.7)
Neutrophils Relative %: 52 % (ref 43–77)
Platelets: 275 10*3/uL (ref 150–400)
Potassium: 3.6 meq/L (ref 3.5–5.3)
Prothrombin Time: 13.2 s (ref 11.6–15.2)
RBC: 3.93 M/uL (ref 3.87–5.11)
RDW: 13.9 % (ref 11.5–15.5)
Sodium: 137 meq/L (ref 135–145)
Total Bilirubin: 0.4 mg/dL (ref 0.3–1.2)
Total Protein: 8.2 g/dL (ref 6.0–8.3)
WBC: 3.1 10*3/uL — ABNORMAL LOW (ref 4.0–10.5)
aPTT: 26 s (ref 24–37)

## 2008-08-29 ENCOUNTER — Telehealth: Payer: Self-pay | Admitting: *Deleted

## 2008-08-30 ENCOUNTER — Ambulatory Visit: Payer: Self-pay | Admitting: Internal Medicine

## 2008-08-30 ENCOUNTER — Encounter: Payer: Self-pay | Admitting: Internal Medicine

## 2008-09-11 LAB — CONVERTED CEMR LAB
BUN: 19 mg/dL (ref 6–23)
CO2: 22 meq/L (ref 19–32)
Calcium: 9.9 mg/dL (ref 8.4–10.5)
Chloride: 102 meq/L (ref 96–112)
Creatinine, Ser: 1.39 mg/dL — ABNORMAL HIGH (ref 0.40–1.20)
Glucose, Bld: 88 mg/dL (ref 70–99)
Potassium: 3.7 meq/L (ref 3.5–5.3)
Sodium: 136 meq/L (ref 135–145)

## 2008-09-20 ENCOUNTER — Ambulatory Visit: Payer: Self-pay | Admitting: Gastroenterology

## 2008-09-21 ENCOUNTER — Encounter (INDEPENDENT_AMBULATORY_CARE_PROVIDER_SITE_OTHER): Payer: Self-pay | Admitting: Internal Medicine

## 2008-09-21 ENCOUNTER — Ambulatory Visit: Payer: Self-pay | Admitting: Internal Medicine

## 2008-09-23 ENCOUNTER — Telehealth (INDEPENDENT_AMBULATORY_CARE_PROVIDER_SITE_OTHER): Payer: Self-pay | Admitting: Internal Medicine

## 2008-09-25 LAB — CONVERTED CEMR LAB
BUN: 12 mg/dL (ref 6–23)
Bilirubin Urine: NEGATIVE
CO2: 20 meq/L (ref 19–32)
Calcium: 9.8 mg/dL (ref 8.4–10.5)
Chloride: 105 meq/L (ref 96–112)
Creatinine, Ser: 1.3 mg/dL — ABNORMAL HIGH (ref 0.40–1.20)
Ferritin: 146 ng/mL (ref 10–291)
Glucose, Bld: 79 mg/dL (ref 70–99)
HCT: 34.1 % — ABNORMAL LOW (ref 36.0–46.0)
Hemoglobin, Urine: NEGATIVE
Hemoglobin: 10.7 g/dL — ABNORMAL LOW (ref 12.0–15.0)
Iron: 56 ug/dL (ref 42–145)
Ketones, ur: NEGATIVE mg/dL
Leukocytes, UA: NEGATIVE
MCHC: 31.4 g/dL (ref 30.0–36.0)
MCV: 84.2 fL (ref 78.0–100.0)
Nitrite: NEGATIVE
Platelets: 313 10*3/uL (ref 150–400)
Potassium: 3.4 meq/L — ABNORMAL LOW (ref 3.5–5.3)
Protein, ur: NEGATIVE mg/dL
RBC: 4.05 M/uL (ref 3.87–5.11)
RDW: 14.1 % (ref 11.5–15.5)
Saturation Ratios: 18 % — ABNORMAL LOW (ref 20–55)
Sed Rate: 60 mm/hr — ABNORMAL HIGH (ref 0–22)
Sodium: 142 meq/L (ref 135–145)
Specific Gravity, Urine: 1.005 (ref 1.005–1.03)
TIBC: 317 ug/dL (ref 250–470)
UIBC: 261 ug/dL
Urine Glucose: NEGATIVE mg/dL
Urobilinogen, UA: 0.2 (ref 0.0–1.0)
WBC: 3.7 10*3/uL — ABNORMAL LOW (ref 4.0–10.5)
pH: 6 (ref 5.0–8.0)

## 2008-09-28 ENCOUNTER — Telehealth: Payer: Self-pay | Admitting: Licensed Clinical Social Worker

## 2008-10-03 ENCOUNTER — Telehealth: Payer: Self-pay | Admitting: *Deleted

## 2008-10-04 ENCOUNTER — Encounter: Payer: Self-pay | Admitting: Licensed Clinical Social Worker

## 2008-10-04 ENCOUNTER — Telehealth: Payer: Self-pay | Admitting: *Deleted

## 2008-10-06 ENCOUNTER — Telehealth: Payer: Self-pay | Admitting: *Deleted

## 2008-10-23 ENCOUNTER — Telehealth (INDEPENDENT_AMBULATORY_CARE_PROVIDER_SITE_OTHER): Payer: Self-pay | Admitting: Internal Medicine

## 2008-11-23 ENCOUNTER — Telehealth: Payer: Self-pay | Admitting: *Deleted

## 2008-11-24 IMAGING — CR DG CHEST 1V PORT
1 series · 1 of 1 positions shown · non-contrast
Comparison: none

CLINICAL DATA: Chest pain. 
 PORTABLE CHEST -  SINGLE VIEW - 02/26/07 AT [DATE] P.M.:
 Comparison Examination:   01/13/07.

[view not recorded]
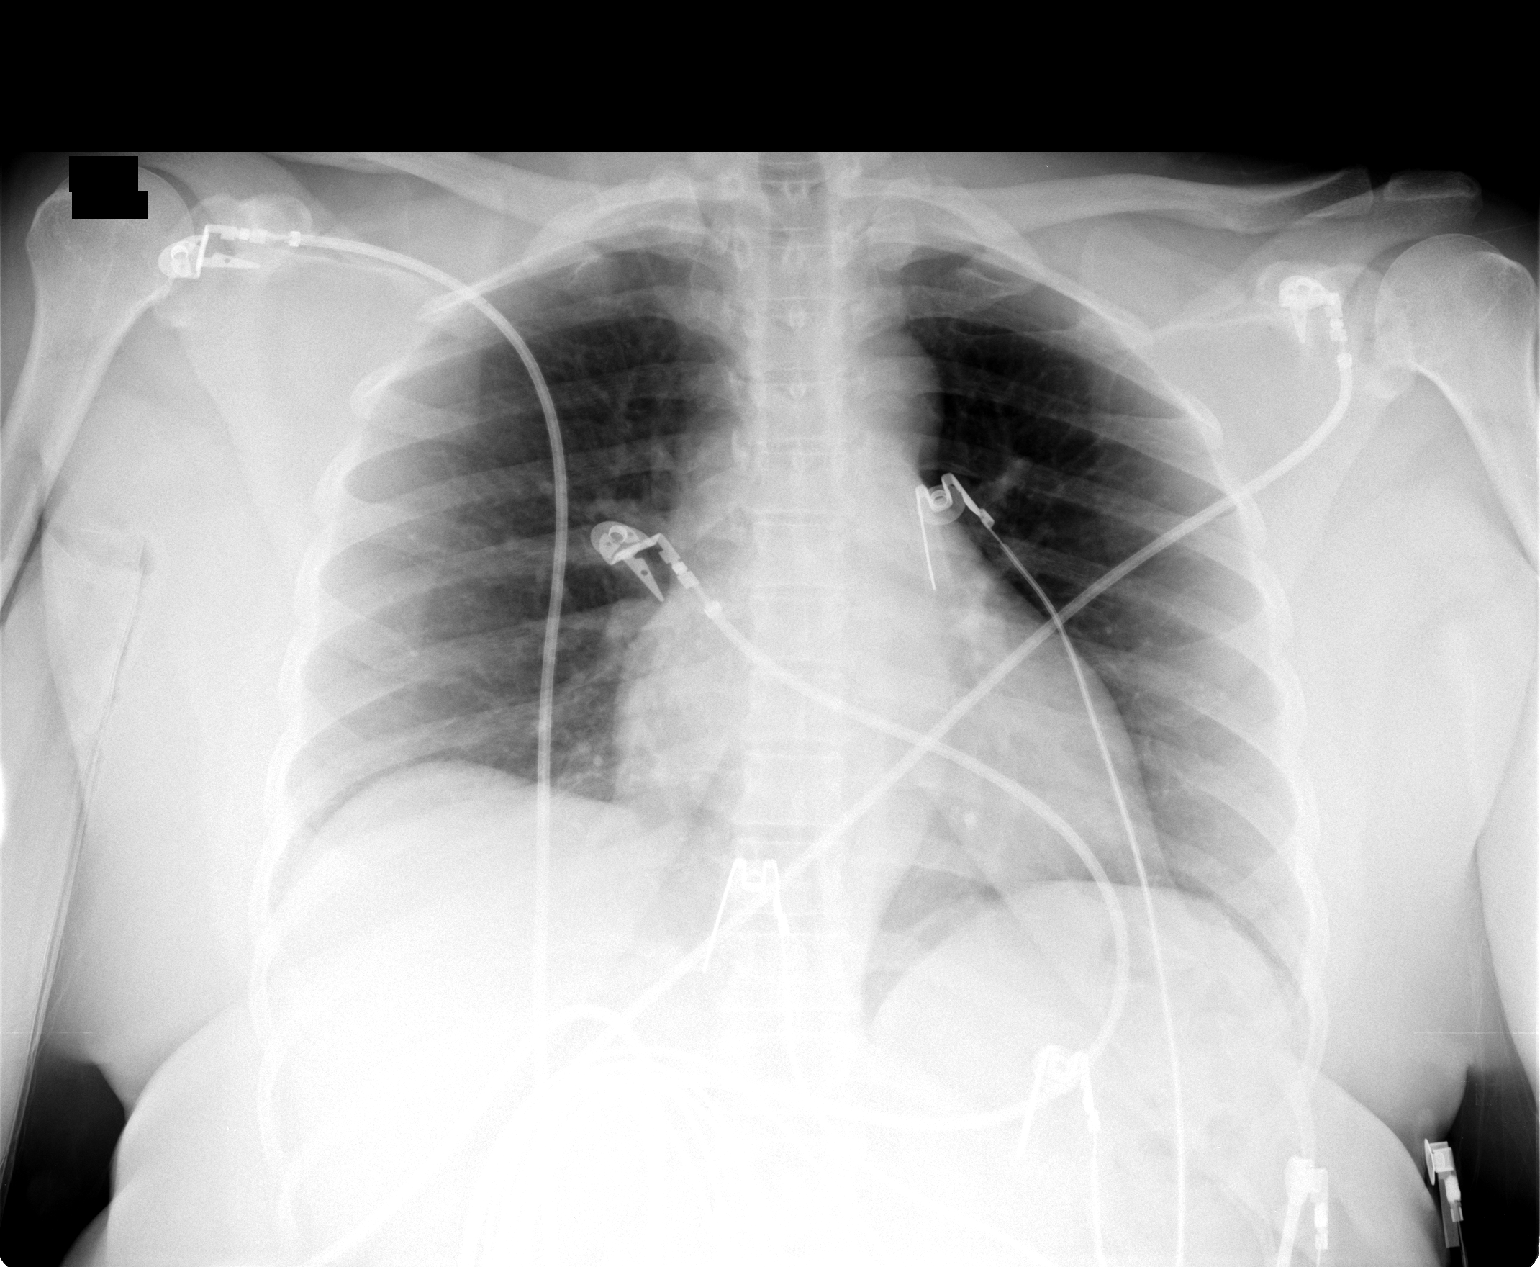

[1 of 1 positions shown; findings below may reference images not displayed]

FINDINGS: The heart is slightly enlarged and the aorta mildly tortuous.  There is mild central pulmonary vascular prominence without infiltrate, congestive heart failure, or pneumothorax.
IMPRESSION: 1.  Mild cardiomegaly and tortuous aorta.  
 2.  No infiltrate or congestive heart failure.

## 2008-11-25 IMAGING — CR DG CHEST 2V
2 series · 2 of 2 positions shown · non-contrast
Comparison: 02/26/07.

CLINICAL DATA: Chest pain, pre cardiac catheterization.
 CHEST - 2 VIEW:

[w chest pa]
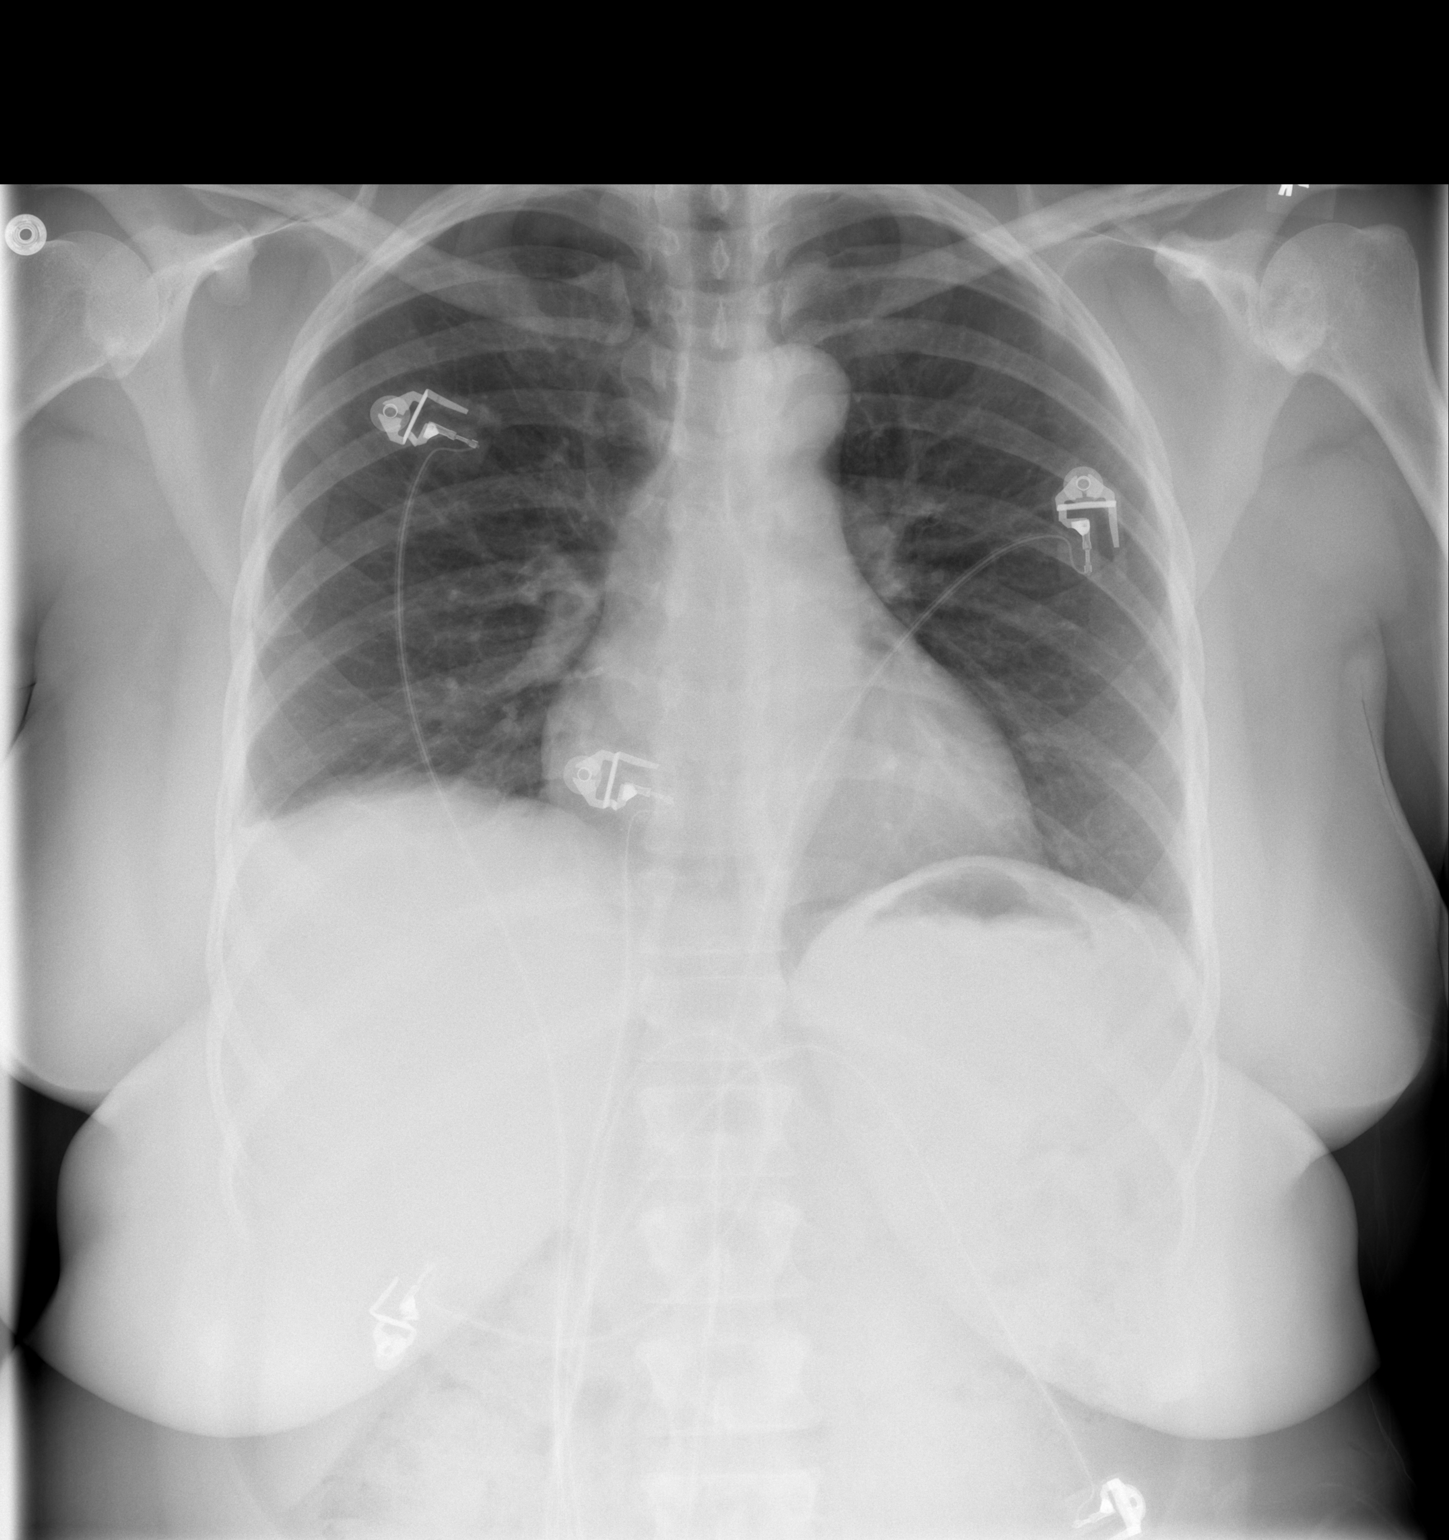

[w chest lat]
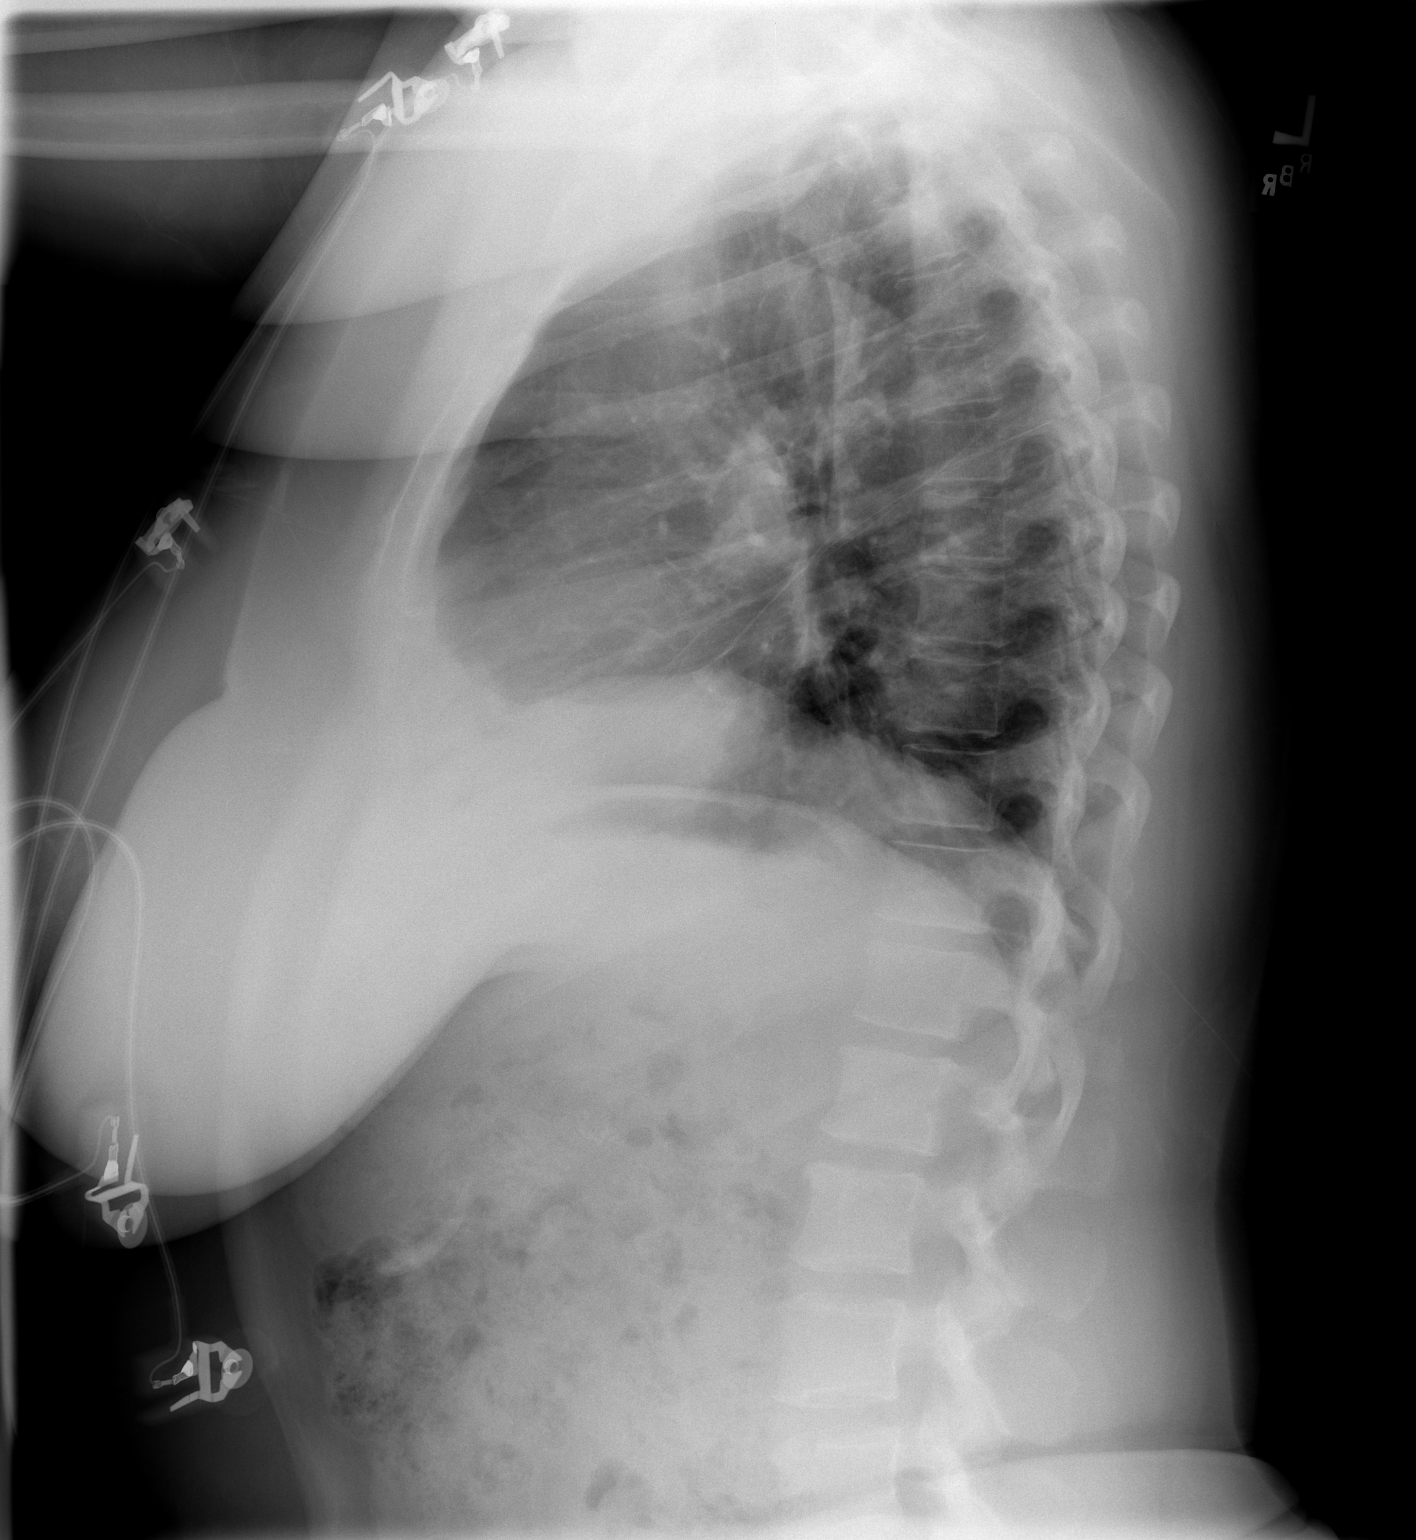

[2 of 2 positions shown; findings below may reference images not displayed]

FINDINGS: Heart size is normal.  Lung volumes are lower, with minimal right lower lobe atelectasis.
IMPRESSION: Minimal right lower lobe atelectasis.

## 2008-11-30 ENCOUNTER — Telehealth (INDEPENDENT_AMBULATORY_CARE_PROVIDER_SITE_OTHER): Payer: Self-pay | Admitting: Internal Medicine

## 2008-12-15 ENCOUNTER — Telehealth (INDEPENDENT_AMBULATORY_CARE_PROVIDER_SITE_OTHER): Payer: Self-pay | Admitting: Internal Medicine

## 2008-12-18 ENCOUNTER — Telehealth (INDEPENDENT_AMBULATORY_CARE_PROVIDER_SITE_OTHER): Payer: Self-pay | Admitting: Internal Medicine

## 2008-12-25 ENCOUNTER — Ambulatory Visit (HOSPITAL_COMMUNITY): Admission: RE | Admit: 2008-12-25 | Discharge: 2008-12-25 | Payer: Self-pay | Admitting: Internal Medicine

## 2008-12-25 ENCOUNTER — Encounter (INDEPENDENT_AMBULATORY_CARE_PROVIDER_SITE_OTHER): Payer: Self-pay | Admitting: Internal Medicine

## 2008-12-25 ENCOUNTER — Ambulatory Visit: Payer: Self-pay | Admitting: Internal Medicine

## 2008-12-25 DIAGNOSIS — K7689 Other specified diseases of liver: Secondary | ICD-10-CM | POA: Insufficient documentation

## 2008-12-27 ENCOUNTER — Encounter (INDEPENDENT_AMBULATORY_CARE_PROVIDER_SITE_OTHER): Payer: Self-pay | Admitting: Internal Medicine

## 2008-12-27 ENCOUNTER — Telehealth (INDEPENDENT_AMBULATORY_CARE_PROVIDER_SITE_OTHER): Payer: Self-pay | Admitting: Internal Medicine

## 2008-12-27 DIAGNOSIS — K047 Periapical abscess without sinus: Secondary | ICD-10-CM | POA: Insufficient documentation

## 2008-12-27 LAB — CONVERTED CEMR LAB
BUN: 13 mg/dL (ref 6–23)
CO2: 23 meq/L (ref 19–32)
Calcium: 10.2 mg/dL (ref 8.4–10.5)
Chloride: 101 meq/L (ref 96–112)
Creatinine, Ser: 1.02 mg/dL (ref 0.40–1.20)
Glucose, Bld: 121 mg/dL — ABNORMAL HIGH (ref 70–99)
Potassium: 3.6 meq/L (ref 3.5–5.3)
Sed Rate: 64 mm/hr — ABNORMAL HIGH (ref 0–22)
Sodium: 140 meq/L (ref 135–145)

## 2009-01-02 ENCOUNTER — Ambulatory Visit (HOSPITAL_COMMUNITY): Admission: RE | Admit: 2009-01-02 | Discharge: 2009-01-02 | Payer: Self-pay | Admitting: Internal Medicine

## 2009-01-05 DIAGNOSIS — R700 Elevated erythrocyte sedimentation rate: Secondary | ICD-10-CM | POA: Insufficient documentation

## 2009-01-05 LAB — CONVERTED CEMR LAB
ANA Titer 1: 1:40 {titer} — ABNORMAL HIGH
Anti Nuclear Antibody(ANA): POSITIVE — AB

## 2009-01-12 ENCOUNTER — Telehealth (INDEPENDENT_AMBULATORY_CARE_PROVIDER_SITE_OTHER): Payer: Self-pay | Admitting: Internal Medicine

## 2009-01-22 ENCOUNTER — Telehealth (INDEPENDENT_AMBULATORY_CARE_PROVIDER_SITE_OTHER): Payer: Self-pay | Admitting: Internal Medicine

## 2009-01-29 ENCOUNTER — Telehealth (INDEPENDENT_AMBULATORY_CARE_PROVIDER_SITE_OTHER): Payer: Self-pay | Admitting: Internal Medicine

## 2009-02-06 ENCOUNTER — Telehealth: Payer: Self-pay | Admitting: *Deleted

## 2009-02-27 ENCOUNTER — Encounter (INDEPENDENT_AMBULATORY_CARE_PROVIDER_SITE_OTHER): Payer: Self-pay | Admitting: Internal Medicine

## 2009-02-27 ENCOUNTER — Encounter: Payer: Self-pay | Admitting: Licensed Clinical Social Worker

## 2009-02-27 ENCOUNTER — Ambulatory Visit: Payer: Self-pay | Admitting: Internal Medicine

## 2009-02-28 ENCOUNTER — Telehealth (INDEPENDENT_AMBULATORY_CARE_PROVIDER_SITE_OTHER): Payer: Self-pay | Admitting: Internal Medicine

## 2009-02-28 ENCOUNTER — Encounter (INDEPENDENT_AMBULATORY_CARE_PROVIDER_SITE_OTHER): Payer: Self-pay | Admitting: Internal Medicine

## 2009-02-28 LAB — CONVERTED CEMR LAB
Bilirubin Urine: NEGATIVE
Candida species: NEGATIVE
Chlamydia, DNA Probe: NEGATIVE
GC Probe Amp, Genital: NEGATIVE
Gardnerella vaginalis: POSITIVE — AB
Hemoglobin, Urine: NEGATIVE
Ketones, ur: NEGATIVE mg/dL
Nitrite: NEGATIVE
Protein, ur: NEGATIVE mg/dL
RBC / HPF: NONE SEEN (ref <3)
Specific Gravity, Urine: 1.006 (ref 1.005–1.030)
Trichomonal Vaginitis: NEGATIVE
Urine Glucose: NEGATIVE mg/dL
Urobilinogen, UA: 0.2 (ref 0.0–1.0)
pH: 7 (ref 5.0–8.0)

## 2009-03-19 ENCOUNTER — Telehealth: Payer: Self-pay | Admitting: *Deleted

## 2009-03-28 ENCOUNTER — Ambulatory Visit: Payer: Self-pay | Admitting: Internal Medicine

## 2009-03-28 ENCOUNTER — Encounter (INDEPENDENT_AMBULATORY_CARE_PROVIDER_SITE_OTHER): Payer: Self-pay | Admitting: Internal Medicine

## 2009-03-28 DIAGNOSIS — R0789 Other chest pain: Secondary | ICD-10-CM | POA: Insufficient documentation

## 2009-04-05 LAB — CONVERTED CEMR LAB
ALT: 13 units/L (ref 0–35)
ANA Titer 1: 1:80 {titer} — ABNORMAL HIGH
AST: 18 units/L (ref 0–37)
Albumin: 4.6 g/dL (ref 3.5–5.2)
Alkaline Phosphatase: 59 units/L (ref 39–117)
Anti Nuclear Antibody(ANA): POSITIVE — AB
BUN: 11 mg/dL (ref 6–23)
CO2: 23 meq/L (ref 19–32)
CRP: 0.4 mg/dL (ref <0.6)
Calcium: 10.1 mg/dL (ref 8.4–10.5)
Chloride: 101 meq/L (ref 96–112)
Cholesterol: 206 mg/dL — ABNORMAL HIGH (ref 0–200)
Creatinine, Ser: 0.97 mg/dL (ref 0.40–1.20)
ENA SM Ab Ser-aCnc: 0.2 (ref <1.0)
GFR calc Af Amer: >60 mL/min (ref >60)
GFR calc non Af Amer: 60 mL/min — ABNORMAL LOW (ref >60)
Glucose, Bld: 105 mg/dL — ABNORMAL HIGH (ref 70–99)
HDL: 75 mg/dL (ref >39)
LDL Cholesterol: 90 mg/dL (ref 0–99)
Potassium: 4.1 meq/L (ref 3.5–5.3)
Sed Rate: 50 mm/hr — ABNORMAL HIGH (ref 0–22)
Sodium: 138 meq/L (ref 135–145)
TSH: 0.747 microintl units/mL (ref 0.350–4.500)
Total Bilirubin: 0.3 mg/dL (ref 0.3–1.2)
Total CHOL/HDL Ratio: 2.7
Total Protein: 8.3 g/dL (ref 6.0–8.3)
Triglycerides: 206 mg/dL — ABNORMAL HIGH (ref <150)
VLDL: 41 mg/dL — ABNORMAL HIGH (ref 0–40)
ds DNA Ab: <1 (ref <5)

## 2009-04-23 ENCOUNTER — Ambulatory Visit (HOSPITAL_COMMUNITY): Admission: RE | Admit: 2009-04-23 | Discharge: 2009-04-23 | Payer: Self-pay | Admitting: Internal Medicine

## 2009-04-30 ENCOUNTER — Encounter (INDEPENDENT_AMBULATORY_CARE_PROVIDER_SITE_OTHER): Payer: Self-pay | Admitting: Internal Medicine

## 2009-05-07 ENCOUNTER — Telehealth (INDEPENDENT_AMBULATORY_CARE_PROVIDER_SITE_OTHER): Payer: Self-pay | Admitting: Internal Medicine

## 2009-05-17 ENCOUNTER — Telehealth (INDEPENDENT_AMBULATORY_CARE_PROVIDER_SITE_OTHER): Payer: Self-pay | Admitting: Internal Medicine

## 2009-05-23 ENCOUNTER — Telehealth: Payer: Self-pay | Admitting: *Deleted

## 2009-05-23 ENCOUNTER — Telehealth (INDEPENDENT_AMBULATORY_CARE_PROVIDER_SITE_OTHER): Payer: Self-pay | Admitting: Internal Medicine

## 2009-05-30 ENCOUNTER — Telehealth: Payer: Self-pay | Admitting: Licensed Clinical Social Worker

## 2009-06-26 ENCOUNTER — Telehealth (INDEPENDENT_AMBULATORY_CARE_PROVIDER_SITE_OTHER): Payer: Self-pay | Admitting: Internal Medicine

## 2009-07-30 ENCOUNTER — Telehealth (INDEPENDENT_AMBULATORY_CARE_PROVIDER_SITE_OTHER): Payer: Self-pay | Admitting: Internal Medicine

## 2009-08-03 ENCOUNTER — Telehealth (INDEPENDENT_AMBULATORY_CARE_PROVIDER_SITE_OTHER): Payer: Self-pay | Admitting: Internal Medicine

## 2009-09-10 ENCOUNTER — Telehealth (INDEPENDENT_AMBULATORY_CARE_PROVIDER_SITE_OTHER): Payer: Self-pay | Admitting: Internal Medicine

## 2009-10-01 ENCOUNTER — Telehealth (INDEPENDENT_AMBULATORY_CARE_PROVIDER_SITE_OTHER): Payer: Self-pay | Admitting: Internal Medicine

## 2009-11-07 ENCOUNTER — Ambulatory Visit: Payer: Self-pay | Admitting: Infectious Disease

## 2009-11-07 DIAGNOSIS — D649 Anemia, unspecified: Secondary | ICD-10-CM | POA: Insufficient documentation

## 2009-11-07 LAB — CONVERTED CEMR LAB
Bilirubin Urine: NEGATIVE
Blood in Urine, dipstick: NEGATIVE
Glucose, Urine, Semiquant: NEGATIVE
Ketones, urine, test strip: NEGATIVE
Nitrite: NEGATIVE
Protein, U semiquant: NEGATIVE
Specific Gravity, Urine: <1.005
Urobilinogen, UA: 0.2
pH: 7

## 2009-11-19 LAB — CONVERTED CEMR LAB
Candida species: NEGATIVE
Chlamydia, DNA Probe: NEGATIVE
GC Probe Amp, Genital: NEGATIVE
Gardnerella vaginalis: POSITIVE — AB

## 2009-11-22 ENCOUNTER — Telehealth: Payer: Self-pay | Admitting: Licensed Clinical Social Worker

## 2009-11-23 ENCOUNTER — Telehealth: Payer: Self-pay | Admitting: *Deleted

## 2009-11-23 ENCOUNTER — Telehealth (INDEPENDENT_AMBULATORY_CARE_PROVIDER_SITE_OTHER): Payer: Self-pay | Admitting: Internal Medicine

## 2009-11-26 LAB — CONVERTED CEMR LAB
Amphetamine Screen, Ur: NEGATIVE
BUN: 12 mg/dL (ref 6–23)
Barbiturate Quant, Ur: NEGATIVE
Benzodiazepines.: NEGATIVE
Bilirubin Urine: NEGATIVE
CO2: 23 meq/L (ref 19–32)
Calcium: 9.6 mg/dL (ref 8.4–10.5)
Chloride: 101 meq/L (ref 96–112)
Cocaine Metabolites: NEGATIVE
Creatinine, Ser: 1.35 mg/dL — ABNORMAL HIGH (ref 0.40–1.20)
Creatinine,U: 42.1 mg/dL
Glucose, Bld: 82 mg/dL (ref 70–99)
HCT: 36.4 % (ref 36.0–46.0)
Hemoglobin, Urine: NEGATIVE
Hemoglobin: 11.6 g/dL — ABNORMAL LOW (ref 12.0–15.0)
Ketones, ur: NEGATIVE mg/dL
MCHC: 31.9 g/dL (ref 30.0–36.0)
MCV: 83.9 fL (ref >=78.0)
Marijuana Metabolite: NEGATIVE
Methadone: NEGATIVE
Nitrite: NEGATIVE
Opiates: NEGATIVE
Phencyclidine (PCP): NEGATIVE
Platelets: 243 10*3/uL (ref 150–400)
Potassium: 3.7 meq/L (ref 3.5–5.3)
Propoxyphene: NEGATIVE
Protein, ur: NEGATIVE mg/dL
RBC / HPF: NONE SEEN (ref <3)
RBC: 4.34 M/uL (ref 3.87–5.11)
RDW: 14 % (ref 11.5–15.5)
Sodium: 137 meq/L (ref 135–145)
Specific Gravity, Urine: 1.005 (ref 1.005–1.0)
Urine Glucose: NEGATIVE mg/dL
Urobilinogen, UA: 0.2 (ref 0.0–1.0)
WBC, UA: NONE SEEN cells/hpf (ref <3)
WBC: 3.7 10*3/uL — ABNORMAL LOW (ref 4.0–10.5)
pH: 7.5 (ref 5.0–8.0)

## 2009-11-27 ENCOUNTER — Telehealth (INDEPENDENT_AMBULATORY_CARE_PROVIDER_SITE_OTHER): Payer: Self-pay | Admitting: Internal Medicine

## 2009-12-04 ENCOUNTER — Ambulatory Visit: Payer: Self-pay | Admitting: Infectious Diseases

## 2009-12-04 ENCOUNTER — Encounter: Payer: Self-pay | Admitting: Licensed Clinical Social Worker

## 2009-12-04 ENCOUNTER — Ambulatory Visit (HOSPITAL_COMMUNITY): Admission: RE | Admit: 2009-12-04 | Discharge: 2009-12-04 | Payer: Self-pay | Admitting: Family Medicine

## 2009-12-04 DIAGNOSIS — F329 Major depressive disorder, single episode, unspecified: Secondary | ICD-10-CM | POA: Insufficient documentation

## 2009-12-06 LAB — CONVERTED CEMR LAB
ALT: 17 units/L (ref 0–35)
AST: 24 units/L (ref 0–37)
Albumin: 4.5 g/dL (ref 3.5–5.2)
Alkaline Phosphatase: 73 units/L (ref 39–117)
Amphetamine Screen, Ur: NEGATIVE
BUN: 10 mg/dL (ref 6–23)
Barbiturate Quant, Ur: NEGATIVE
Benzodiazepines.: NEGATIVE
CO2: 24 meq/L (ref 19–32)
Calcium: 9.7 mg/dL (ref 8.4–10.5)
Chloride: 103 meq/L (ref 96–112)
Cocaine Metabolites: NEGATIVE
Creatinine, Ser: 1.06 mg/dL (ref 0.40–1.20)
Creatinine,U: 88.2 mg/dL
Glucose, Bld: 66 mg/dL — ABNORMAL LOW (ref 70–99)
Marijuana Metabolite: NEGATIVE
Methadone: NEGATIVE
Opiates: NEGATIVE
Phencyclidine (PCP): NEGATIVE
Potassium: 3.3 meq/L — ABNORMAL LOW (ref 3.5–5.3)
Propoxyphene: NEGATIVE
Sed Rate: 52 mm/hr — ABNORMAL HIGH (ref 0–22)
Sodium: 142 meq/L (ref 135–145)
Total Bilirubin: 0.4 mg/dL (ref 0.3–1.2)
Total Protein: 8.5 g/dL — ABNORMAL HIGH (ref 6.0–8.3)

## 2009-12-07 ENCOUNTER — Telehealth (INDEPENDENT_AMBULATORY_CARE_PROVIDER_SITE_OTHER): Payer: Self-pay | Admitting: Internal Medicine

## 2009-12-07 ENCOUNTER — Encounter (INDEPENDENT_AMBULATORY_CARE_PROVIDER_SITE_OTHER): Payer: Self-pay | Admitting: Internal Medicine

## 2009-12-18 ENCOUNTER — Telehealth (INDEPENDENT_AMBULATORY_CARE_PROVIDER_SITE_OTHER): Payer: Self-pay | Admitting: *Deleted

## 2009-12-18 ENCOUNTER — Telehealth (INDEPENDENT_AMBULATORY_CARE_PROVIDER_SITE_OTHER): Payer: Self-pay | Admitting: Internal Medicine

## 2009-12-19 ENCOUNTER — Telehealth (INDEPENDENT_AMBULATORY_CARE_PROVIDER_SITE_OTHER): Payer: Self-pay | Admitting: Internal Medicine

## 2009-12-28 ENCOUNTER — Telehealth: Payer: Self-pay | Admitting: *Deleted

## 2009-12-31 ENCOUNTER — Encounter (INDEPENDENT_AMBULATORY_CARE_PROVIDER_SITE_OTHER): Payer: Self-pay | Admitting: Internal Medicine

## 2010-01-15 ENCOUNTER — Telehealth: Payer: Self-pay | Admitting: Internal Medicine

## 2010-01-16 ENCOUNTER — Telehealth: Payer: Self-pay | Admitting: *Deleted

## 2010-01-16 ENCOUNTER — Telehealth (INDEPENDENT_AMBULATORY_CARE_PROVIDER_SITE_OTHER): Payer: Self-pay | Admitting: Internal Medicine

## 2010-01-17 ENCOUNTER — Telehealth (INDEPENDENT_AMBULATORY_CARE_PROVIDER_SITE_OTHER): Payer: Self-pay | Admitting: Internal Medicine

## 2010-01-17 ENCOUNTER — Telehealth: Payer: Self-pay | Admitting: Internal Medicine

## 2010-01-23 ENCOUNTER — Ambulatory Visit: Payer: Self-pay | Admitting: Internal Medicine

## 2010-01-24 ENCOUNTER — Telehealth: Payer: Self-pay | Admitting: *Deleted

## 2010-01-25 LAB — CONVERTED CEMR LAB
BUN: 22 mg/dL (ref 6–23)
CO2: 19 meq/L (ref 19–32)
Calcium: 10 mg/dL (ref 8.4–10.5)
Chloride: 101 meq/L (ref 96–112)
Creatinine, Ser: 1.95 mg/dL — ABNORMAL HIGH (ref 0.40–1.20)
Glucose, Bld: 89 mg/dL (ref 70–99)
Potassium: 4 meq/L (ref 3.5–5.3)
Sodium: 134 meq/L — ABNORMAL LOW (ref 135–145)

## 2010-02-04 ENCOUNTER — Telehealth (INDEPENDENT_AMBULATORY_CARE_PROVIDER_SITE_OTHER): Payer: Self-pay | Admitting: Internal Medicine

## 2010-02-06 ENCOUNTER — Encounter (INDEPENDENT_AMBULATORY_CARE_PROVIDER_SITE_OTHER): Payer: Self-pay | Admitting: Internal Medicine

## 2010-02-06 ENCOUNTER — Telehealth (INDEPENDENT_AMBULATORY_CARE_PROVIDER_SITE_OTHER): Payer: Self-pay | Admitting: Internal Medicine

## 2010-02-06 ENCOUNTER — Telehealth (INDEPENDENT_AMBULATORY_CARE_PROVIDER_SITE_OTHER): Payer: Self-pay | Admitting: *Deleted

## 2010-02-06 ENCOUNTER — Ambulatory Visit: Payer: Self-pay | Admitting: Internal Medicine

## 2010-02-12 LAB — CONVERTED CEMR LAB
Albumin ELP: 51.6 % — ABNORMAL LOW (ref 55.8–66.1)
Albumin, U: DETECTED %
Alpha 1, Urine: DETECTED % — AB
Alpha 2, Urine: DETECTED % — AB
Alpha-1-Globulin: 4 % (ref 2.9–4.9)
Alpha-2-Globulin: 11.2 % (ref 7.1–11.8)
BUN: 7 mg/dL (ref 6–23)
Beta Globulin: 5.3 % (ref 4.7–7.2)
Beta, Urine: DETECTED % — AB
Bilirubin Urine: NEGATIVE
CO2: 23 meq/L (ref 19–32)
Calcium: 9 mg/dL (ref 8.4–10.5)
Chloride: 104 meq/L (ref 96–112)
Creatinine, Ser: 1.09 mg/dL (ref 0.40–1.20)
Free Kappa Lt Chains,Ur: 6.96 mg/dL — ABNORMAL HIGH (ref 0.04–1.51)
Free Lambda Lt Chains,Ur: 2.8 mg/dL — ABNORMAL HIGH (ref 0.08–1.01)
Gamma Globulin, Urine: DETECTED % — AB
Gamma Globulin: 21.6 % — ABNORMAL HIGH (ref 11.1–18.8)
Glucose, Bld: 90 mg/dL (ref 70–99)
Hemoglobin, Urine: NEGATIVE
Ketones, ur: NEGATIVE mg/dL
Leukocytes, UA: NEGATIVE
Nitrite: NEGATIVE
Potassium: 3.1 meq/L — ABNORMAL LOW (ref 3.5–5.3)
Protein, ur: NEGATIVE mg/dL
Sodium: 139 meq/L (ref 135–145)
Specific Gravity, Urine: 1.008 (ref 1.005–1.0)
Total Protein, Serum Electrophoresis: 8.1 g/dL (ref 6.0–8.3)
Total Protein, Urine: 11.3 mg/dL
Urine Glucose: NEGATIVE mg/dL
Urobilinogen, UA: 0.2 (ref 0.0–1.0)
pH: 6.5 (ref 5.0–8.0)

## 2010-02-19 ENCOUNTER — Encounter (INDEPENDENT_AMBULATORY_CARE_PROVIDER_SITE_OTHER): Payer: Self-pay | Admitting: Internal Medicine

## 2010-02-20 ENCOUNTER — Telehealth (INDEPENDENT_AMBULATORY_CARE_PROVIDER_SITE_OTHER): Payer: Self-pay | Admitting: Internal Medicine

## 2010-02-21 ENCOUNTER — Telehealth (INDEPENDENT_AMBULATORY_CARE_PROVIDER_SITE_OTHER): Payer: Self-pay | Admitting: *Deleted

## 2010-02-22 ENCOUNTER — Encounter (INDEPENDENT_AMBULATORY_CARE_PROVIDER_SITE_OTHER): Payer: Self-pay | Admitting: Internal Medicine

## 2010-02-22 ENCOUNTER — Encounter: Payer: Self-pay | Admitting: Licensed Clinical Social Worker

## 2010-02-22 ENCOUNTER — Ambulatory Visit: Payer: Self-pay | Admitting: Oncology

## 2010-02-22 ENCOUNTER — Ambulatory Visit: Payer: Self-pay | Admitting: Internal Medicine

## 2010-02-25 ENCOUNTER — Encounter (INDEPENDENT_AMBULATORY_CARE_PROVIDER_SITE_OTHER): Payer: Self-pay | Admitting: Internal Medicine

## 2010-02-25 LAB — CBC & DIFF AND RETIC
BASO%: 0.2 % (ref 0.0–2.0)
Basophils Absolute: 0 10*3/uL (ref 0.0–0.1)
EOS%: 1 % (ref 0.0–7.0)
Eosinophils Absolute: 0 10*3/uL (ref 0.0–0.5)
HCT: 36 % (ref 34.8–46.6)
HGB: 11.8 g/dL (ref 11.6–15.9)
Immature Retic Fract: 11.4 % — ABNORMAL HIGH (ref 0.00–10.70)
LYMPH%: 23 % (ref 14.0–49.7)
MCH: 27 pg (ref 25.1–34.0)
MCHC: 32.8 g/dL (ref 31.5–36.0)
MCV: 82.4 fL (ref 79.5–101.0)
MONO#: 0.4 10*3/uL (ref 0.1–0.9)
MONO%: 10.3 % (ref 0.0–14.0)
NEUT#: 2.7 10*3/uL (ref 1.5–6.5)
NEUT%: 65.5 % (ref 38.4–76.8)
Platelets: 256 10*3/uL (ref 145–400)
RBC: 4.37 10*6/uL (ref 3.70–5.45)
RDW: 14.4 % (ref 11.2–14.5)
Retic %: 2.06 % — ABNORMAL HIGH (ref 0.50–1.50)
Retic Ct Abs: 90.02 10*3/uL — ABNORMAL HIGH (ref 18.30–72.70)
WBC: 4.2 10*3/uL (ref 3.9–10.3)
lymph#: 1 10*3/uL (ref 0.9–3.3)

## 2010-02-25 LAB — MORPHOLOGY
PLT EST: ADEQUATE
RBC Comments: NORMAL

## 2010-02-25 LAB — CHCC SMEAR

## 2010-02-27 ENCOUNTER — Encounter (INDEPENDENT_AMBULATORY_CARE_PROVIDER_SITE_OTHER): Payer: Self-pay | Admitting: Internal Medicine

## 2010-03-02 LAB — COMPREHENSIVE METABOLIC PANEL
ALT: 9 U/L (ref 0–35)
AST: 19 U/L (ref 0–37)
Albumin: 4.1 g/dL (ref 3.5–5.2)
Alkaline Phosphatase: 82 U/L (ref 39–117)
BUN: 10 mg/dL (ref 6–23)
CO2: 23 mEq/L (ref 19–32)
Calcium: 9.7 mg/dL (ref 8.4–10.5)
Chloride: 102 mEq/L (ref 96–112)
Creatinine, Ser: 1.06 mg/dL (ref 0.40–1.20)
Glucose, Bld: 94 mg/dL (ref 70–99)
Potassium: 3.5 mEq/L (ref 3.5–5.3)
Sodium: 138 mEq/L (ref 135–145)
Total Bilirubin: 0.4 mg/dL (ref 0.3–1.2)
Total Protein: 8.2 g/dL (ref 6.0–8.3)

## 2010-03-02 LAB — TSH: TSH: 2.329 u[IU]/mL (ref 0.350–4.500)

## 2010-03-02 LAB — KAPPA/LAMBDA LIGHT CHAINS
Kappa free light chain: 1.82 mg/dL (ref 0.33–1.94)
Kappa:Lambda Ratio: 0.52 (ref 0.26–1.65)
Lambda Free Lght Chn: 3.52 mg/dL — ABNORMAL HIGH (ref 0.57–2.63)

## 2010-03-02 LAB — FERRITIN: Ferritin: 163 ng/mL (ref 10–291)

## 2010-03-02 LAB — ANTI-NUCLEAR AB-TITER (ANA TITER): ANA Titer 1: 1:80 {titer} — ABNORMAL HIGH

## 2010-03-02 LAB — IRON AND TIBC
%SAT: 19 % — ABNORMAL LOW (ref 20–55)
Iron: 56 ug/dL (ref 42–145)
TIBC: 301 ug/dL (ref 250–470)
UIBC: 245 ug/dL

## 2010-03-02 LAB — ANA: Anti Nuclear Antibody(ANA): POSITIVE — AB

## 2010-03-02 LAB — FOLATE: Folate: 14.6 ng/mL

## 2010-03-02 LAB — VITAMIN B12: Vitamin B-12: 349 pg/mL (ref 211–911)

## 2010-03-04 LAB — CONVERTED CEMR LAB
ALT: 10 units/L (ref 0–35)
AST: 17 units/L (ref 0–37)
Albumin: 4 g/dL (ref 3.5–5.2)
Alkaline Phosphatase: 77 units/L (ref 39–117)
BUN: 12 mg/dL (ref 6–23)
CO2: 22 meq/L (ref 19–32)
Calcium: 9.3 mg/dL (ref 8.4–10.5)
Chloride: 105 meq/L (ref 96–112)
Creatinine, Ser: 0.93 mg/dL (ref 0.40–1.20)
Glucose, Bld: 75 mg/dL (ref 70–99)
IgA: 448 mg/dL — ABNORMAL HIGH (ref 68–378)
IgG (Immunoglobin G), Serum: 1690 mg/dL — ABNORMAL HIGH (ref 694–1618)
IgM, Serum: 243 mg/dL (ref 60–263)
Magnesium: 1.8 mg/dL (ref 1.5–2.5)
Potassium: 3.8 meq/L (ref 3.5–5.3)
Sodium: 138 meq/L (ref 135–145)
Total Bilirubin: 0.4 mg/dL (ref 0.3–1.2)
Total Protein, Serum Electrophoresis: 7.7 g/dL (ref 6.0–8.3)
Total Protein: 7.7 g/dL (ref 6.0–8.3)

## 2010-03-21 ENCOUNTER — Telehealth (INDEPENDENT_AMBULATORY_CARE_PROVIDER_SITE_OTHER): Payer: Self-pay | Admitting: Internal Medicine

## 2010-04-04 ENCOUNTER — Ambulatory Visit: Payer: Self-pay | Admitting: Internal Medicine

## 2010-04-05 ENCOUNTER — Telehealth: Payer: Self-pay | Admitting: *Deleted

## 2010-04-08 DIAGNOSIS — R740 Nonspecific elevation of levels of transaminase and lactic acid dehydrogenase [LDH]: Secondary | ICD-10-CM

## 2010-04-08 DIAGNOSIS — R7401 Elevation of levels of liver transaminase levels: Secondary | ICD-10-CM | POA: Insufficient documentation

## 2010-04-08 LAB — CONVERTED CEMR LAB
ALT: 50 units/L — ABNORMAL HIGH (ref 0–35)
AST: 51 units/L — ABNORMAL HIGH (ref 0–37)
Albumin: 4.5 g/dL (ref 3.5–5.2)
Alkaline Phosphatase: 75 units/L (ref 39–117)
BUN: 14 mg/dL (ref 6–23)
CO2: 23 meq/L (ref 19–32)
Calcium: 9.8 mg/dL (ref 8.4–10.5)
Chloride: 104 meq/L (ref 96–112)
Cholesterol: 188 mg/dL (ref 0–200)
Creatinine, Ser: 1.05 mg/dL (ref 0.40–1.20)
Glucose, Bld: 108 mg/dL — ABNORMAL HIGH (ref 70–99)
HDL: 50 mg/dL (ref >39)
LDL Cholesterol: 93 mg/dL (ref 0–99)
Potassium: 3.7 meq/L (ref 3.5–5.3)
Sodium: 138 meq/L (ref 135–145)
Total Bilirubin: 0.3 mg/dL (ref 0.3–1.2)
Total CHOL/HDL Ratio: 3.8
Total Protein: 8.2 g/dL (ref 6.0–8.3)
Triglycerides: 226 mg/dL — ABNORMAL HIGH (ref <150)
VLDL: 45 mg/dL — ABNORMAL HIGH (ref 0–40)

## 2010-04-11 ENCOUNTER — Telehealth (INDEPENDENT_AMBULATORY_CARE_PROVIDER_SITE_OTHER): Payer: Self-pay | Admitting: Internal Medicine

## 2010-05-06 ENCOUNTER — Telehealth (INDEPENDENT_AMBULATORY_CARE_PROVIDER_SITE_OTHER): Payer: Self-pay | Admitting: Internal Medicine

## 2010-05-17 ENCOUNTER — Telehealth (INDEPENDENT_AMBULATORY_CARE_PROVIDER_SITE_OTHER): Payer: Self-pay | Admitting: Internal Medicine

## 2010-05-22 ENCOUNTER — Encounter (INDEPENDENT_AMBULATORY_CARE_PROVIDER_SITE_OTHER): Payer: Self-pay | Admitting: Internal Medicine

## 2010-06-11 ENCOUNTER — Encounter: Payer: Self-pay | Admitting: Internal Medicine

## 2010-06-11 ENCOUNTER — Inpatient Hospital Stay (HOSPITAL_COMMUNITY): Admission: EM | Admit: 2010-06-11 | Discharge: 2010-06-13 | Payer: Self-pay | Admitting: Emergency Medicine

## 2010-06-11 ENCOUNTER — Ambulatory Visit: Payer: Self-pay | Admitting: Internal Medicine

## 2010-06-12 ENCOUNTER — Encounter: Payer: Self-pay | Admitting: Internal Medicine

## 2010-06-13 ENCOUNTER — Encounter: Payer: Self-pay | Admitting: Internal Medicine

## 2010-06-25 ENCOUNTER — Ambulatory Visit: Payer: Self-pay | Admitting: Oncology

## 2010-07-01 ENCOUNTER — Encounter (INDEPENDENT_AMBULATORY_CARE_PROVIDER_SITE_OTHER): Payer: Self-pay | Admitting: Nurse Practitioner

## 2010-07-01 ENCOUNTER — Encounter: Payer: Self-pay | Admitting: Internal Medicine

## 2010-07-01 LAB — CBC WITH DIFFERENTIAL/PLATELET
BASO%: 0.2 % (ref 0.0–2.0)
Basophils Absolute: 0 10*3/uL (ref 0.0–0.1)
EOS%: 1.3 % (ref 0.0–7.0)
Eosinophils Absolute: 0.1 10*3/uL (ref 0.0–0.5)
HCT: 36.8 % (ref 34.8–46.6)
HGB: 12.1 g/dL (ref 11.6–15.9)
LYMPH%: 33.9 % (ref 14.0–49.7)
MCH: 27 pg (ref 25.1–34.0)
MCHC: 32.9 g/dL (ref 31.5–36.0)
MCV: 82.1 fL (ref 79.5–101.0)
MONO#: 0.4 10*3/uL (ref 0.1–0.9)
MONO%: 8.7 % (ref 0.0–14.0)
NEUT#: 2.6 10*3/uL (ref 1.5–6.5)
NEUT%: 55.9 % (ref 38.4–76.8)
Platelets: 209 10*3/uL (ref 145–400)
RBC: 4.48 10*6/uL (ref 3.70–5.45)
RDW: 13.2 % (ref 11.2–14.5)
WBC: 4.7 10*3/uL (ref 3.9–10.3)
lymph#: 1.6 10*3/uL (ref 0.9–3.3)
nRBC: 0 % (ref 0–0)

## 2010-07-03 LAB — PROTEIN ELECTROPHORESIS, SERUM
Albumin ELP: 47.7 % — ABNORMAL LOW (ref 55.8–66.1)
Alpha-1-Globulin: 4 % (ref 2.9–4.9)
Alpha-2-Globulin: 11.2 % (ref 7.1–11.8)
Beta 2: 7.4 % — ABNORMAL HIGH (ref 3.2–6.5)
Beta Globulin: 5.7 % (ref 4.7–7.2)
Gamma Globulin: 24 % — ABNORMAL HIGH (ref 11.1–18.8)
Total Protein, Serum Electrophoresis: 8.4 g/dL — ABNORMAL HIGH (ref 6.0–8.3)

## 2010-07-03 LAB — COMPREHENSIVE METABOLIC PANEL
ALT: 9 U/L (ref 0–35)
AST: 19 U/L (ref 0–37)
Albumin: 4.3 g/dL (ref 3.5–5.2)
Alkaline Phosphatase: 80 U/L (ref 39–117)
BUN: 15 mg/dL (ref 6–23)
CO2: 23 mEq/L (ref 19–32)
Calcium: 9.9 mg/dL (ref 8.4–10.5)
Chloride: 101 mEq/L (ref 96–112)
Creatinine, Ser: 1.07 mg/dL (ref 0.40–1.20)
Glucose, Bld: 89 mg/dL (ref 70–99)
Potassium: 3.6 mEq/L (ref 3.5–5.3)
Sodium: 136 mEq/L (ref 135–145)
Total Bilirubin: 0.4 mg/dL (ref 0.3–1.2)
Total Protein: 8.4 g/dL — ABNORMAL HIGH (ref 6.0–8.3)

## 2010-07-03 LAB — KAPPA/LAMBDA LIGHT CHAINS
Kappa free light chain: 1.72 mg/dL (ref 0.33–1.94)
Kappa:Lambda Ratio: 0.57 (ref 0.26–1.65)
Lambda Free Lght Chn: 3.01 mg/dL — ABNORMAL HIGH (ref 0.57–2.63)

## 2010-07-15 ENCOUNTER — Emergency Department (HOSPITAL_COMMUNITY): Admission: EM | Admit: 2010-07-15 | Discharge: 2010-07-15 | Payer: Self-pay | Admitting: Emergency Medicine

## 2010-08-27 ENCOUNTER — Encounter: Payer: Self-pay | Admitting: Licensed Clinical Social Worker

## 2010-08-27 ENCOUNTER — Ambulatory Visit: Payer: Self-pay | Admitting: Internal Medicine

## 2010-08-27 ENCOUNTER — Telehealth: Payer: Self-pay | Admitting: Licensed Clinical Social Worker

## 2010-08-30 LAB — CONVERTED CEMR LAB
ALT: 25 units/L (ref 0–35)
AST: 36 units/L (ref 0–37)
Albumin: 4.2 g/dL (ref 3.5–5.2)
Alkaline Phosphatase: 84 units/L (ref 39–117)
BUN: 10 mg/dL (ref 6–23)
CO2: 27 meq/L (ref 19–32)
Calcium: 9.7 mg/dL (ref 8.4–10.5)
Chloride: 107 meq/L (ref 96–112)
Creatinine, Ser: 0.93 mg/dL (ref 0.40–1.20)
Glucose, Bld: 86 mg/dL (ref 70–99)
HCV Ab: NEGATIVE
Hep A IgM: NEGATIVE
Hep B C IgM: NEGATIVE
Hepatitis B Surface Ag: NEGATIVE
Potassium: 3.1 meq/L — ABNORMAL LOW (ref 3.5–5.3)
Sodium: 143 meq/L (ref 135–145)
Total Bilirubin: 0.4 mg/dL (ref 0.3–1.2)
Total Protein: 7.7 g/dL (ref 6.0–8.3)

## 2010-09-02 ENCOUNTER — Telehealth: Payer: Self-pay | Admitting: Internal Medicine

## 2010-09-06 ENCOUNTER — Telehealth (INDEPENDENT_AMBULATORY_CARE_PROVIDER_SITE_OTHER): Payer: Self-pay | Admitting: *Deleted

## 2010-09-17 ENCOUNTER — Telehealth: Payer: Self-pay | Admitting: *Deleted

## 2010-09-18 ENCOUNTER — Encounter: Payer: Self-pay | Admitting: Physician Assistant

## 2010-09-18 ENCOUNTER — Ambulatory Visit: Payer: Self-pay | Admitting: Internal Medicine

## 2010-09-18 ENCOUNTER — Telehealth: Payer: Self-pay | Admitting: Physician Assistant

## 2010-09-18 DIAGNOSIS — E876 Hypokalemia: Secondary | ICD-10-CM | POA: Insufficient documentation

## 2010-09-18 DIAGNOSIS — K219 Gastro-esophageal reflux disease without esophagitis: Secondary | ICD-10-CM | POA: Insufficient documentation

## 2010-09-18 LAB — CONVERTED CEMR LAB
BUN: 14 mg/dL (ref 6–23)
CO2: 23 meq/L (ref 19–32)
Calcium: 9.1 mg/dL (ref 8.4–10.5)
Chloride: 104 meq/L (ref 96–112)
Creatinine, Ser: 0.84 mg/dL (ref 0.40–1.20)
Glucose, Bld: 70 mg/dL (ref 70–99)
Potassium: 3.5 meq/L (ref 3.5–5.3)
Sodium: 140 meq/L (ref 135–145)

## 2010-09-19 ENCOUNTER — Encounter: Payer: Self-pay | Admitting: Physician Assistant

## 2010-09-23 ENCOUNTER — Telehealth: Payer: Self-pay | Admitting: Physician Assistant

## 2010-09-26 ENCOUNTER — Telehealth: Payer: Self-pay | Admitting: Physician Assistant

## 2010-10-04 ENCOUNTER — Ambulatory Visit: Payer: Self-pay | Admitting: Physician Assistant

## 2010-10-10 ENCOUNTER — Ambulatory Visit: Payer: Self-pay | Admitting: Nurse Practitioner

## 2010-10-16 ENCOUNTER — Emergency Department (HOSPITAL_COMMUNITY): Admission: EM | Admit: 2010-10-16 | Discharge: 2010-10-17 | Payer: Self-pay | Admitting: Emergency Medicine

## 2010-10-21 ENCOUNTER — Telehealth (INDEPENDENT_AMBULATORY_CARE_PROVIDER_SITE_OTHER): Payer: Self-pay | Admitting: Nurse Practitioner

## 2010-10-22 ENCOUNTER — Emergency Department (HOSPITAL_COMMUNITY)
Admission: EM | Admit: 2010-10-22 | Discharge: 2010-10-22 | Payer: Self-pay | Source: Home / Self Care | Admitting: Emergency Medicine

## 2010-10-24 ENCOUNTER — Encounter (INDEPENDENT_AMBULATORY_CARE_PROVIDER_SITE_OTHER): Payer: Self-pay | Admitting: *Deleted

## 2010-10-24 ENCOUNTER — Ambulatory Visit: Payer: Self-pay | Admitting: Oncology

## 2010-10-29 ENCOUNTER — Telehealth (INDEPENDENT_AMBULATORY_CARE_PROVIDER_SITE_OTHER): Payer: Self-pay | Admitting: Nurse Practitioner

## 2010-10-30 ENCOUNTER — Ambulatory Visit: Payer: Self-pay | Admitting: Nurse Practitioner

## 2010-10-30 ENCOUNTER — Telehealth (INDEPENDENT_AMBULATORY_CARE_PROVIDER_SITE_OTHER): Payer: Self-pay | Admitting: Nurse Practitioner

## 2010-10-31 ENCOUNTER — Encounter (INDEPENDENT_AMBULATORY_CARE_PROVIDER_SITE_OTHER): Payer: Self-pay | Admitting: Nurse Practitioner

## 2010-10-31 DIAGNOSIS — D72829 Elevated white blood cell count, unspecified: Secondary | ICD-10-CM | POA: Insufficient documentation

## 2010-11-01 ENCOUNTER — Encounter (INDEPENDENT_AMBULATORY_CARE_PROVIDER_SITE_OTHER): Payer: Self-pay | Admitting: Internal Medicine

## 2010-11-11 ENCOUNTER — Emergency Department (HOSPITAL_COMMUNITY): Admission: EM | Admit: 2010-11-11 | Discharge: 2010-11-12 | Payer: Self-pay | Admitting: Emergency Medicine

## 2010-11-18 ENCOUNTER — Telehealth (INDEPENDENT_AMBULATORY_CARE_PROVIDER_SITE_OTHER): Payer: Self-pay | Admitting: Nurse Practitioner

## 2010-11-18 LAB — CONVERTED CEMR LAB
Albumin ELP: 47.9 % — ABNORMAL LOW (ref 55.8–66.1)
Alpha-1-Globulin: 3.7 % (ref 2.9–4.9)
Alpha-2-Globulin: 10.5 % (ref 7.1–11.8)
Beta Globulin: 6.2 % (ref 4.7–7.2)
Folate: 18 ng/mL
Gamma Globulin: 25.1 % — ABNORMAL HIGH (ref 11.1–18.8)
LDH: 154 units/L (ref 94–250)
Total Protein, Serum Electrophoresis: 8.6 g/dL — ABNORMAL HIGH (ref 6.0–8.3)
Vitamin B-12: 319 pg/mL (ref 211–911)

## 2010-11-21 ENCOUNTER — Ambulatory Visit: Payer: Self-pay | Admitting: Nurse Practitioner

## 2010-11-25 ENCOUNTER — Ambulatory Visit: Payer: Self-pay | Admitting: Oncology

## 2010-11-28 ENCOUNTER — Ambulatory Visit: Payer: Self-pay | Admitting: Nurse Practitioner

## 2010-11-29 ENCOUNTER — Telehealth (INDEPENDENT_AMBULATORY_CARE_PROVIDER_SITE_OTHER): Payer: Self-pay | Admitting: Nurse Practitioner

## 2010-12-17 ENCOUNTER — Encounter (INDEPENDENT_AMBULATORY_CARE_PROVIDER_SITE_OTHER): Payer: Self-pay | Admitting: Nurse Practitioner

## 2010-12-18 ENCOUNTER — Telehealth (INDEPENDENT_AMBULATORY_CARE_PROVIDER_SITE_OTHER): Payer: Self-pay | Admitting: Nurse Practitioner

## 2010-12-30 ENCOUNTER — Telehealth (INDEPENDENT_AMBULATORY_CARE_PROVIDER_SITE_OTHER): Payer: Self-pay | Admitting: Nurse Practitioner

## 2011-01-01 ENCOUNTER — Telehealth (INDEPENDENT_AMBULATORY_CARE_PROVIDER_SITE_OTHER): Payer: Self-pay | Admitting: Nurse Practitioner

## 2011-01-12 ENCOUNTER — Encounter: Payer: Self-pay | Admitting: Internal Medicine

## 2011-01-13 ENCOUNTER — Telehealth (INDEPENDENT_AMBULATORY_CARE_PROVIDER_SITE_OTHER): Payer: Self-pay | Admitting: Nurse Practitioner

## 2011-01-14 ENCOUNTER — Encounter (INDEPENDENT_AMBULATORY_CARE_PROVIDER_SITE_OTHER): Payer: Self-pay | Admitting: Nurse Practitioner

## 2011-01-14 ENCOUNTER — Telehealth (INDEPENDENT_AMBULATORY_CARE_PROVIDER_SITE_OTHER): Payer: Self-pay | Admitting: Nurse Practitioner

## 2011-01-18 ENCOUNTER — Emergency Department (HOSPITAL_COMMUNITY)
Admission: EM | Admit: 2011-01-18 | Discharge: 2011-01-18 | Payer: Self-pay | Source: Home / Self Care | Admitting: Emergency Medicine

## 2011-01-18 LAB — CBC
HCT: 37.6 % (ref 36.0–46.0)
Hemoglobin: 12 g/dL (ref 12.0–15.0)
MCH: 26.4 pg (ref 26.0–34.0)
MCHC: 31.9 g/dL (ref 30.0–36.0)
MCV: 82.8 fL (ref 78.0–100.0)
Platelets: 278 10*3/uL (ref 150–400)
RBC: 4.54 MIL/uL (ref 3.87–5.11)
RDW: 14.1 % (ref 11.5–15.5)
WBC: 4.5 10*3/uL (ref 4.0–10.5)

## 2011-01-18 LAB — URINALYSIS, ROUTINE W REFLEX MICROSCOPIC
Bilirubin Urine: NEGATIVE
Hgb urine dipstick: NEGATIVE
Ketones, ur: NEGATIVE mg/dL
Nitrite: NEGATIVE
Protein, ur: NEGATIVE mg/dL
Specific Gravity, Urine: 1.002 — ABNORMAL LOW (ref 1.005–1.030)
Urine Glucose, Fasting: NEGATIVE mg/dL
Urobilinogen, UA: 0.2 mg/dL (ref 0.0–1.0)
pH: 7 (ref 5.0–8.0)

## 2011-01-18 LAB — COMPREHENSIVE METABOLIC PANEL
ALT: 18 U/L (ref 0–35)
AST: 26 U/L (ref 0–37)
Albumin: 4.2 g/dL (ref 3.5–5.2)
Alkaline Phosphatase: 110 U/L (ref 39–117)
BUN: 5 mg/dL — ABNORMAL LOW (ref 6–23)
CO2: 26 mEq/L (ref 19–32)
Calcium: 9.6 mg/dL (ref 8.4–10.5)
Chloride: 103 mEq/L (ref 96–112)
Creatinine, Ser: 0.81 mg/dL (ref 0.4–1.2)
GFR calc Af Amer: >60 mL/min (ref >60)
GFR calc non Af Amer: >60 mL/min (ref >60)
Glucose, Bld: 92 mg/dL (ref 70–99)
Potassium: 3.4 mEq/L — ABNORMAL LOW (ref 3.5–5.1)
Sodium: 137 mEq/L (ref 135–145)
Total Bilirubin: 0.6 mg/dL (ref 0.3–1.2)
Total Protein: 8.6 g/dL — ABNORMAL HIGH (ref 6.0–8.3)

## 2011-01-18 LAB — DIFFERENTIAL
Basophils Absolute: 0 10*3/uL (ref 0.0–0.1)
Basophils Relative: 0 % (ref 0–1)
Eosinophils Absolute: 0.1 10*3/uL (ref 0.0–0.7)
Eosinophils Relative: 3 % (ref 0–5)
Lymphocytes Relative: 33 % (ref 12–46)
Lymphs Abs: 1.5 10*3/uL (ref 0.7–4.0)
Monocytes Absolute: 0.3 10*3/uL (ref 0.1–1.0)
Monocytes Relative: 7 % (ref 3–12)
Neutro Abs: 2.5 10*3/uL (ref 1.7–7.7)
Neutrophils Relative %: 57 % (ref 43–77)

## 2011-01-18 LAB — LIPASE, BLOOD: Lipase: 26 U/L (ref 11–59)

## 2011-01-19 LAB — CONVERTED CEMR LAB
ALT: 15 units/L (ref 0–35)
AST: 23 units/L (ref 0–37)
Albumin: 4.4 g/dL (ref 3.5–5.2)
Alkaline Phosphatase: 87 units/L (ref 39–117)
BUN: 14 mg/dL (ref 6–23)
Basophils Absolute: 0 10*3/uL (ref 0.0–0.1)
Basophils Relative: 0 % (ref 0–1)
Bilirubin Urine: NEGATIVE
Blood in Urine, dipstick: NEGATIVE
CO2: 23 meq/L (ref 19–32)
Calcium: 9.9 mg/dL (ref 8.4–10.5)
Chloride: 105 meq/L (ref 96–112)
Cholesterol, target level: 200 mg/dL
Cholesterol: 163 mg/dL (ref 0–200)
Creatinine, Ser: 0.91 mg/dL (ref 0.40–1.20)
Eosinophils Absolute: 0.1 10*3/uL (ref 0.0–0.7)
Eosinophils Relative: 2 % (ref 0–5)
Glucose, Bld: 87 mg/dL (ref 70–99)
Glucose, Urine, Semiquant: NEGATIVE
HCT: 39.9 % (ref 36.0–46.0)
HDL goal, serum: 40 mg/dL
HDL: 47 mg/dL (ref >39)
Hemoglobin: 12.3 g/dL (ref 12.0–15.0)
Ketones, urine, test strip: NEGATIVE
LDL Cholesterol: 95 mg/dL (ref 0–99)
LDL Goal: 100 mg/dL
Lymphocytes Relative: 39 % (ref 12–46)
Lymphs Abs: 1.1 10*3/uL (ref 0.7–4.0)
MCHC: 30.8 g/dL (ref 30.0–36.0)
MCV: 85.8 fL (ref 78.0–100.0)
Microalb, Ur: 1.91 mg/dL — ABNORMAL HIGH (ref 0.00–1.89)
Monocytes Absolute: 0.3 10*3/uL (ref 0.1–1.0)
Monocytes Relative: 11 % (ref 3–12)
Neutro Abs: 1.4 10*3/uL — ABNORMAL LOW (ref 1.7–7.7)
Neutrophils Relative %: 48 % (ref 43–77)
Nitrite: NEGATIVE
Pap Smear: NORMAL
Platelets: 303 10*3/uL (ref 150–400)
Potassium: 3.7 meq/L (ref 3.5–5.3)
Protein, U semiquant: NEGATIVE
RBC: 4.65 M/uL (ref 3.87–5.11)
RDW: 14.6 % (ref 11.5–15.5)
Rapid HIV Screen: NEGATIVE
Sodium: 141 meq/L (ref 135–145)
Specific Gravity, Urine: <1.005
TSH: 4.042 microintl units/mL (ref 0.350–4.500)
Total Bilirubin: 0.4 mg/dL (ref 0.3–1.2)
Total CHOL/HDL Ratio: 3.5
Total Protein: 8.3 g/dL (ref 6.0–8.3)
Triglycerides: 103 mg/dL (ref <150)
Urobilinogen, UA: 0.2
VLDL: 21 mg/dL (ref 0–40)
WBC Urine, dipstick: NEGATIVE
WBC: 2.9 10*3/uL — ABNORMAL LOW (ref 4.0–10.5)
pH: 6

## 2011-01-23 NOTE — Progress Notes (Signed)
Summary: med refill/gp  Phone Note Refill Request Message from:  Fax from Pharmacy on November 23, 2009 11:56 AM  Refills Requested: Medication #1:  MAXZIDE-25 37.5-25 MG  TABS Take one tablet by mouth daily.   Last Refilled: 10/24/2009  Medication #2:  DARVOCET-N 100 100-650 MG TABS Take one tablet by mouth every six hours as needed for pain.   Last Refilled: 10/24/2009   ** Darvocet has been taken off the market; needs to be changed to another med. on their formulary.   Method Requested: Fax to Local Pharmacy Initial call taken by: Chinita Pester RN,  November 23, 2009 11:58 AM  Follow-up for Phone Call        Will refill maxide, however, I have tried to call pt about darvocet and her number has been disconnected so will not refill alt agent at this time.  She can take extra strength tylenol for pain.  Follow-up by: Joaquin Courts  MD,  November 26, 2009 8:53 AM    Prescriptions: MAXZIDE-25 37.5-25 MG  TABS (TRIAMTERENE-HCTZ) Take one tablet by mouth daily.  #30 x 8   Entered and Authorized by:   Joaquin Courts  MD   Signed by:   Joaquin Courts  MD on 11/26/2009   Method used:   Faxed to ...       [facility] (retail)       [address] [place], [place]  [address]       Ph: [phone]       Fax: [phone]   RxID:   [id number]

## 2011-01-23 NOTE — Progress Notes (Signed)
Summary: In hospital 10/27 for headaches  Phone Note Call from Patient   Summary of Call: spoke with pt and she let me know that she went to hospital on thursday night for headaches.... pt says the headaches was causing nose bleed.... pt says she is still having headaches and she wants to know what can she take... they gave her reglan in hospital but nothing to take home... pt says she has OTC tyenlol but that doesn't help.... NKDA.... [facility]..... Initial call taken by: [name],  October 21, 2010 2:20 PM  Follow-up for Phone Call        ER notes are on your desk.  Dutch Quint RN  October 21, 2010 5:06 PM   Additional Follow-up for Phone Call Additional follow up Details #1::        ER note reviewed looks like her BP was elevated - is she taking BP meds? looks like she has a long  hx of headache She should try to avoid caffiene. See phone note from 09/17/2010 last done by Lorin Picket would only be wiling to add sedapap as needed for headaches (rx in basket) Additional Follow-up by: Lehman Prom FNP,  October 21, 2010 6:06 PM    Additional Follow-up for Phone Call Additional follow up Details #2::    Rx faxed to [facility].  Dutch Quint RN  October 24, 2010 9:35 AM  [phone] # D/C, [phone] no answer, no voice mail, [phone] [facility] [place] personal does not have anyone by that name. Contact # [phone] not valid.  Letter mailed. Gaylyn Cheers RN  October 24, 2010 11:13 AM   Read note of 9/27.  States that she takes her BP meds every day.  Advised of Sedapap Rx that was faxed last week and provider's response/recommendations.  States she also watches her caffeine intake.   Has appt. 10/30/10 -- advised to keep appt. to discuss headaches further and issues with anxiety.  Dutch Quint RN  October 28, 2010 3:30 PM   New/Updated Medications: SEDAPAP 50-650 MG TABS (BUTALBITAL-ACETAMINOPHEN) One tablet by mouth daily as needed for headache Prescriptions: SEDAPAP 50-650 MG  TABS (BUTALBITAL-ACETAMINOPHEN) One tablet by mouth daily as needed for headache  #30 x 0   Entered and Authorized by:   Lehman Prom FNP   Signed by:   Lehman Prom FNP on 10/21/2010   Method used:   Printed then faxed to ...       [facility] (retail)       [address] [place], [place]  [address]       Ph: [phone]       Fax: [phone]   RxID:   [id number] SEDAPAP 50-650 MG TABS (BUTALBITAL-ACETAMINOPHEN) One tablet by mouth daily as needed for headache  #30 x 0   Entered and Authorized by:   Lehman Prom FNP   Signed by:   Lehman Prom FNP on 10/21/2010   Method used:   Printed then faxed to ...       [facility] (retail)       [address] [place], [place]  [address]       Ph: [phone]       Fax: [phone]   RxID:   [id number]

## 2011-01-23 NOTE — Miscellaneous (Signed)
Summary: 11/15/07-11/18/07  Clinical Lists Changes The pt was admitted 11/15/07-11/18/07 for L sided CP.  A d-dimer was elevated at 3.04 and a CT angio of the chest was done to evaluate for PE, which was negative.  Hepatic cystic dz was noted.  The pt had a cardiac cath by Dr. Lynnea Ferrier and recommended an outpt myoview with an appt with him after the study was done.  He also recommended starting the pt on Imdur 30 mg daily.  The pt was pain free at discharge and started Imdur without difficulty.   Medications: Added new medication of IMDUR 30 MG  TB24 (ISOSORBIDE MONONITRATE) One tablet by mouth daily. - Signed Rx of IMDUR 30 MG  TB24 (ISOSORBIDE MONONITRATE) One tablet by mouth daily.;  #30 x 3;  Signed;  Entered by: Joaquin Courts  MD;  Authorized by: Joaquin Courts  MD;  Method used: Historical    Prescriptions: IMDUR 30 MG  TB24 (ISOSORBIDE MONONITRATE) One tablet by mouth daily.  #30 x 3   Entered and Authorized by:   Joaquin Courts  MD   Signed by:   Joaquin Courts  MD on 11/18/2007   Method used:   Historical   RxID:   [id number]   Appended Document: 11/15/07-11/18/07 Spoke with Dr. Ruffin Frederick in radiology and he recommended a f/u RUQ u/s in 3 months to further investigate cystic masses in the liver.  He felt like these were most likely to be benign and 3 mos f/u with u/s would be adequate.

## 2011-01-23 NOTE — Progress Notes (Signed)
Summary: Medication   Phone Note Refill Request Message from:  Fax from Pharmacy on January 24, 2010 3:40 PM  Refills Requested: Medication #1:  BUSPIRONE HCL 15 MG TABS Take 1/2 tab by mouth twice a day for  two weeks and then increase to a full tab by mouth twice daily.Marland Kitchen [facility] does not carry Buspar 15 mg.  Is there something else that can be used.   Method Requested: Fax to Local Pharmacy Initial call taken by: Angelina Ok RN,  January 24, 2010 3:43 PM  Follow-up for Phone Call        No, there aren't any other non-controlled substances available.  She may have to try to get it through the MAP program or go to another pharmacy.  She should really see a psychiatrist and perhaps they would have an alternative.  Follow-up by: Joaquin Courts  MD,  January 24, 2010 3:56 PM

## 2011-01-23 NOTE — Letter (Signed)
Summary: [facility]  [facility]   Imported By: Margie Billet 03/22/2010 10:38:01  _____________________________________________________________________  External Attachment:    Type:   Image     Comment:   External Document

## 2011-01-23 NOTE — Progress Notes (Signed)
Summary: call for med/ hla  Phone Note Call from Patient   Summary of Call: pt has called again, with same request Initial call taken by: Marin Roberts RN,  January 16, 2010 4:22 PM

## 2011-01-23 NOTE — Progress Notes (Signed)
Summary: refill/gg  Phone Note Refill Request  on August 03, 2009 2:05 PM  Refills Requested: Medication #1:  DARVOCET-N 100 100-650 MG TABS Take one tablet by mouth every six hours as needed for pain.  Method Requested: Telephone to Pharmacy Initial call taken by: Merrie Roof RN,  August 03, 2009 2:05 PM    Prescriptions: DARVOCET-N 100 100-650 MG TABS (PROPOXYPHENE N-APAP) Take one tablet by mouth every six hours as needed for pain.  #90 x 1   Entered by:   Madaline Guthrie MD   Authorized by:   Joaquin Courts  MD   Signed by:   Madaline Guthrie MD on 08/03/2009   Method used:   Telephoned to ...       [facility] Department (retail)       [address] [place], [place]  [address]       Ph: [phone]       Fax: [phone]   RxID:   [id number]   Appended Document: refill/gg called in

## 2011-01-23 NOTE — Progress Notes (Signed)
Summary: med refill/gp  Phone Note Refill Request Message from:  Fax from Pharmacy on February 04, 2010 3:11 PM  Refills Requested: Medication #1:  PRAVACHOL 10 MG  TABS Take 1 tablet by mouth once a day before bed.   Last Refilled: 01/04/2010   Also [facility] pharm states pt. request to be started on Alprazolam since Clonazepam was stopped.   Method Requested: Fax to Local Pharmacy Initial call taken by: Chinita Pester RN,  February 04, 2010 3:12 PM  Follow-up for Phone Call        Will not write for ANY controlled substances, including xanax.  Will refill pravachol but she needs to have lipids recheck in April. Follow-up by: Joaquin Courts  MD,  February 04, 2010 3:31 PM  Additional Follow-up for Phone Call Additional follow up Details #1::        Rx faxed to pharmacy per MD. Additional Follow-up by: Chinita Pester RN,  February 05, 2010 2:07 PM    Prescriptions: PRAVACHOL 10 MG  TABS (PRAVASTATIN SODIUM) Take 1 tablet by mouth once a day before bed.  #30 x 0   Entered and Authorized by:   Joaquin Courts  MD   Signed by:   Joaquin Courts  MD on 02/04/2010   Method used:   Faxed to ...       [facility] Department (retail)       [address] [place], [place]  [address]       Ph: [phone]       Fax: [phone]   RxID:   [id number]

## 2011-01-23 NOTE — Progress Notes (Signed)
Summary: HEADACHE STILL/REQWUESTING MEDS  Phone Note Call from Patient Call back at Home Phone [phone]   Summary of Call: WEAVER PT. MS Farra SAYS THAT AT HER VISIT THIS MORNING SHE MENTION TO SCOTT ABOUT HER HEADACHES AND SHE IS STILL HAVEING BAD HEADACHE PAINS AND SHE DIDN'T GET ANYTHING FOR IT, SO SHE IS ASKING IF YOU WILL CALL SOMETHING IN. Initial call taken by: Leodis Rains,  September 18, 2010 2:36 PM  Follow-up for Phone Call        forward to provider Follow-up by: [name],  September 18, 2010 3:33 PM  Additional Follow-up for Phone Call Additional follow up Details #1::        I believe her headaches are related to her BP. She needs to get back on meds for BP.  Get the Norvasc filled.  I gave her a prescription today. She can take Tylenol 500 mg 1-2 tabs every 6 hours as needed for a headache. If needed she can take Ibuprofen 600-800 mg with food no more than every 8 hours as needed for headache.  Try to avoid taking Ibuprofen too much as this can run up her BP. Additional Follow-up by: Tereso Newcomer PA-C,  September 18, 2010 5:38 PM    Additional Follow-up for Phone Call Additional follow up Details #2::    PT STATES THAT HER HEADACHES ARE NOT COMING FROM HER BP. SHE STATES THAT SHE HAD THEM IN THE PAST AND WAS PUT ON PREDNISONE. DID ADVISE PATIENT TO COME BY OFFICE TO SIGN A RELEASE SO THAT WE CAN GETTHOSE RECORDS THAT WOULD HELP PROVIDER CARE FOR HER. AT THIS TIME SHE SAID THE OTC MEDS WILL NOT HELP.....Marland KitchenMarland KitchenHassell Halim CMA  September 20, 2010 10:31 AM    Extensive review of her chart.  She was put on a trial of prednisone in the Spring of 2010 due to fears she may have temporal arteritis.  Notes state she did not get a recommended biopsy (temporal artery biopsy) done to confirm.  However, her abnormal blood tests (ESR) did not change.  So, it was not felt that prednisone was indicated any further at that time.  Tests for lupus were negative.  However, there  were some questions if she could have vasculitis.  The recommendation was for her to see rheumatology.  Notes indicate she did not show up to her appts. with the rheumatologist.  I do not see any indication for prednisone at this time.  Patient will need an appt to evaluate her headaches to decide on further evaluation and treatment. Tereso Newcomer PA-C  September 20, 2010 1:06 PM   Additional Follow-up for Phone Call Additional follow up Details #3:: Details for Additional Follow-up Action Taken: pt is aware.... [name]  September 20, 2010 2:52 PM

## 2011-01-23 NOTE — Progress Notes (Signed)
Summary: refill/ hla  Phone Note Refill Request Message from:  Patient on December 18, 2008 9:50 AM  Refills Requested: Medication #1:  COREG 6.25 MG TABS Take 1 tablet by mouth two times a day Initial call taken by: Marin Roberts RN,  December 18, 2008 9:50 AM  Follow-up for Phone Call        Refill approved-nurse to complete Follow-up by: Joaquin Courts  MD,  December 18, 2008 10:26 AM      Prescriptions: COREG 6.25 MG TABS (CARVEDILOL) Take 1 tablet by mouth two times a day  #60 x 5   Entered and Authorized by:   Joaquin Courts  MD   Signed by:   Joaquin Courts  MD on 12/18/2008   Method used:   Electronically to        [facility] [place] (retail)       [address]       [place], [place]  [address]       Ph: [phone]       Fax: [phone]   RxID:   [id number]

## 2011-01-23 NOTE — Progress Notes (Signed)
Summary: med rfill/gp  Phone Note Refill Request Message from:  Fax from Pharmacy on May 23, 2009 10:25 AM  Refills Requested: Medication #1:  KLONOPIN 1 MG TABS Take 1 tablet by mouth three times a day   Last Refilled: 04/13/2009  Method Requested: Telephone to Pharmacy Initial call taken by: Chinita Pester RN,  May 23, 2009 10:25 AM  Follow-up for Phone Call        Refill approved-nurse to complete Follow-up by: Joaquin Courts  MD,  May 24, 2009 8:28 AM  Additional Follow-up for Phone Call Additional follow up Details #1::        Rx refill request  faxed to pharmacy - [facility] Additional Follow-up by: Chinita Pester RN,  May 24, 2009 10:04 AM      Prescriptions: KLONOPIN 1 MG TABS (CLONAZEPAM) Take 1 tablet by mouth three times a day  #30 x 3   Entered and Authorized by:   Joaquin Courts  MD   Signed by:   Joaquin Courts  MD on 05/24/2009   Method used:   Telephoned to ...       [facility] Department (retail)       [address] [place], [place]  [address]       Ph: [phone]       Fax: [phone]   RxID:   [id number]

## 2011-01-23 NOTE — Progress Notes (Signed)
Summary: refill, change to xr/ hla  Phone Note Refill Request Message from:  Fax from Pharmacy on May 26, 2007 4:18 PM  Refills Requested: Medication #1:  EFFEXOR 75 MG TABS Take 1 tablet by mouth two times a day for nerves   Last Refilled: 05/26/2007 [facility] does not carry plain effexor, they have effexor xr 75mg  and 37.5 mg could we change to this?  Initial call taken by: Marin Roberts RN,  May 26, 2007 4:20 PM  Follow-up for Phone Call        OK to change to Effexor XR; see prescription. Follow-up by: Margarito Liner MD,  May 26, 2007 5:03 PM  Additional Follow-up for Phone Call Additional follow up Details #1::        Rx faxed to pharmacy Additional Follow-up by: Marin Roberts RN,  May 26, 2007 5:27 PM  New/Updated Medications: EFFEXOR XR 75 MG CP24 (VENLAFAXINE HCL) Take 2 capsules by mouth once a day  New/Updated Medications: EFFEXOR XR 75 MG CP24 (VENLAFAXINE HCL) Take 2 capsules by mouth once a day  Prescriptions: EFFEXOR XR 75 MG CP24 (VENLAFAXINE HCL) Take 2 capsules by mouth once a day  #60 x 2   Entered and Authorized by:   Margarito Liner MD   Signed by:   Margarito Liner MD on 05/26/2007   Method used:   Telephoned to ...       [facility]       [address] [place], [place]  [address]  [place]       Ph: [phone]       Fax: [phone]   RxID:   [id number]

## 2011-01-23 NOTE — Progress Notes (Signed)
Summary: refill/ hla  Phone Note Refill Request Message from:  Patient on October 06, 2008 3:53 PM  Refills Requested: Medication #1:  BENAZEPRIL HCL 40 MG TABS Take 1 tablet by mouth once a day for blood pressure. Take this after finishing the Lisinopril  Medication #2:  DARVOCET A500 100-500 MG  TABS take one tab by mouth once every 6 hours as needed for headache Initial call taken by: Marin Roberts RN,  October 06, 2008 3:53 PM  Follow-up for Phone Call        Refill approved-nurse to complete.  Please advise patient to keep her appt next week. Follow-up by: Margarito Liner MD,  October 06, 2008 5:02 PM  Additional Follow-up for Phone Call Additional follow up Details #1::        Rx called to pharmacy Additional Follow-up by: Marin Roberts RN,  October 06, 2008 5:29 PM      Prescriptions: DARVOCET A500 100-500 MG  TABS (PROPOXYPHENE N-APAP) take one tab by mouth once every 6 hours as needed for headache  #15 x 0   Entered and Authorized by:   Margarito Liner MD   Signed by:   Margarito Liner MD on 10/06/2008   Method used:   Telephoned to ...       [address] (retail)       [address]       [place], [place]  [address]       Ph: [phone]       Fax: [phone]   RxID:   [id number] BENAZEPRIL HCL 40 MG TABS (BENAZEPRIL HCL) Take 1 tablet by mouth once a day for blood pressure. Take this after finishing the Lisinopril  #30 x 2   Entered and Authorized by:   Margarito Liner MD   Signed by:   Margarito Liner MD on 10/06/2008   Method used:   Telephoned to ...       [address] (retail)       [address]       [place], [place]  [address]       Ph: [phone]       Fax: [phone]   RxID:   [id number]

## 2011-01-23 NOTE — Assessment & Plan Note (Signed)
Summary: ACUTE-WANTS TO BE TESTED FOR A STD PER HELEN/CFB   Vital Signs:  Patient Profile:   55 Years Old Female Height:     63 inches (160.02 cm) Weight:      181.5 pounds (82.50 kg) BMI:     32.27 Temp:     97.9 degrees F (36.61 degrees C) oral Pulse rate:   85 / minute BP sitting:   111 / 72  (right arm)  Pt. in pain?   no  Vitals Entered By: Chinita Pester RN (March 29, 2008 3:03 PM)              Is Patient Diabetic? No Nutritional Status BMI of > 30 = obese  Have you ever been in a relationship where you felt threatened, hurt or afraid?No   Does patient need assistance? Functional Status Self care Ambulation Normal     PCP:  Joaquin Courts  MD  Chief Complaint:  Continue to have yellow d/c and burning w/urination .  History of Present Illness: Pt is a 55 yo female w/ PMHx below here for evaluation of dysuria, vaginal itching, white vaginal d/c and increased urinary frequency.  She would like to be test for STD's and notes an encounter of unprotected sex.  She denies fevers and chills.  She denies any change in her chronic back pain.    Current Allergies: No known allergies   Past Medical History:    Reviewed history from 03/23/2008 and no changes required:       CAD: S/p cardiac catherization by Dr.Ganji(3/08)                Mild LAD stenosis (60%)                Ef 50-55%                Medical mangement              w/ repeat cath in 12/08 by Dr. Lynnea Ferrier: essentially normal coronary                arteries with a possible 20% mid left anterior descending lesion versus               a myocardial bridge. A f/u myoview as an outpt was to be done which she did have done.         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         No family hx of MI, DM, or breast cancer.    Social History:    Reviewed history from 10/19/2007 and no changes required:       Never smoked, no alcohol, divorced.     Risk Factors: Tobacco use:  never Alcohol use:  no Exercise:  no Seatbelt use:  100 %  PAP Smear History:  Date of Last PAP Smear:  07/08/2007   Review of Systems       As per HPI.   Physical Exam  General:     Appropriately groomed, somewhat anxious appearing. Genitalia:     Normal introitus for age, no external lesions, no vaginal discharge, mucosa pink and moist, no vaginal or cervical lesions, no vaginal atrophy, no friaility or hemorrhage, normal uterus size and position, no adnexal masses or tenderness    Impression & Recommendations:  Problem # 1:  DYSURIA (ICD-788.1) Since urine at last visit grew 35,000 colonoies of group B strep and she is symptomatic, will go ahead and tx w/ cipro.  Will also check wet prep and gc/chlamydia and tx appropriately.  Will f/u if symptoms do not resolve.  Declined testing for HIV and syphilis.  Will f/u w/ PCP in two months for routine exam.  Her updated medication list for this problem includes:    Cipro 250 Mg Tabs (Ciprofloxacin hcl) .Marland Kitchen... Take one tablet by mouth twice a day.   Complete Medication List: 1)  Coreg 6.25 Mg Tabs (Carvedilol) .... Take 1 tablet by mouth two times a day 2)  Benazepril Hcl 40 Mg Tabs (Benazepril hcl) .... Take 1 tablet by mouth once a day for blood pressure. take this after finishing the lisinopril 3)  Antivert 12.5 Mg Tabs (Meclizine hcl) .Marland Kitchen.. 1-2 tablets every 4 hours as needed for dizziness 4)  Valium 2 Mg Tabs (Diazepam) .... Take 1 tablet by mouth two times a day as needed anxiety 5)  Maxzide-25 37.5-25 Mg  Tabs (Triamterene-hctz) .... Take one tablet by mouth daily. 6)  Pravachol 10 Mg Tabs (Pravastatin sodium) .... Take 1 tablet by mouth once a day before bed. 7)  Ibuprofen 800 Mg Tabs (Ibuprofen) .... Take one tablet twice a day as needed for pain. 8)  Darvocet A500 100-500 Mg Tabs (Propoxyphene n-apap) .... Take one tab by mouth once every 6 hours as needed for headache 9)  Paroxetine Hcl 25 Mg Tb24 (Paroxetine hcl) .... Take one tablet every morning. 10)  Fluconazole 150 Mg Tabs (Fluconazole) .... Take one tablet by mouth. 11)  Cipro 250 Mg Tabs (Ciprofloxacin hcl) .... Take one tablet by mouth twice a day.  Other Orders: T-GC Probe, genital [phone]) T-Chlamydia ([address]) T-Wet Prep (in-house) [phone])   Patient Instructions: 1)  Please  make a followup appointment in two months. 2)  Please make an appointment if your symptoms do not resolve.    Prescriptions: CIPRO 250 MG  TABS (CIPROFLOXACIN HCL) Take one tablet by mouth twice a day.  #6 x 0   Entered and Authorized by:   Joaquin Courts  MD   Signed by:   Joaquin Courts  MD on 03/30/2008   Method used:   Electronically sent to ...       [address]       [address]       [place], [place]  [address]       Ph: [phone]       Fax: [phone]   RxID:   [id number]  ]

## 2011-01-23 NOTE — Progress Notes (Signed)
----   Converted from flag ---- ---- 12/28/2009 1:12 PM, Catherine Courts  MD wrote: Thank you!  ---- 12/28/2009 12:06 PM, Catherine Pester RN wrote: I called her yesterday. She said she still planning on going to [facility] and working on her transportation. And we don't need to "baby her";":she's a grown woman". I will fax labs to [facility].  ---- 12/27/2009 11:08 AM, Catherine Courts  MD wrote: Do you mind calling her to see if she has arranged transportation. I am not going to call her b/c she complained about me and she might be less likely to go.  Also, if she plans on going, please send her labwork done(including last several ANA's and ESR).  If not, she needs to have the appt cancelled. Thanks! Catherine [name]  ---- 12/27/2009 10:45 AM, Catherine Pester RN wrote: Dr. Andrey Campanile, [name] has her appt. at [facility]. Jan 12  ---- 12/04/2009 3:20 PM, Catherine Pester RN wrote: send Catherine [name] a flag one wk. prior to appt. Jan. 12,2011 [facility] ------------------------------

## 2011-01-23 NOTE — Progress Notes (Signed)
Summary: meds/ hla  Phone Note From Pharmacy   Summary of Call: spoke w/ [facility] tues 2/15, reports pt called numerous times mon 2/14 stating that md told her that the pharm could change her clonazepam to alprazolam, they informed her each time that they would need to call her physician for the change, tues they had less calls. pt did call med triage and i spoke w/ her for some time, she wanted me to change her meds, i informed her several times that she would need to be seen, finally i was able to book an appt. Initial call taken by: Marin Roberts RN,  February 06, 2010 9:52 AM  Follow-up for Phone Call        Noted Follow-up by: Zoila Shutter MD,  February 06, 2010 9:57 AM  Additional Follow-up for Phone Call Additional follow up Details #1::        i will see her today to address this.  It is, obviously, not appropriate for her to tell the pharmacy I ok'd medications when I did not.  Additional Follow-up by: Joaquin Courts  MD,  February 06, 2010 11:26 AM

## 2011-01-23 NOTE — Progress Notes (Signed)
Summary: Lyndel Pleasure from practice  Phone Note From Other Clinic   Summary of Call: Admin Note Re: Dismissal Patient has been repetitively abusive and a thoughful decision has been made to dismiss from [facility].  Certified letter sent yesterday to be scanned, documentation in emr/idx Initial call taken by: Raynaldo Opitz,  September 06, 2010 4:00 PM

## 2011-01-23 NOTE — Progress Notes (Signed)
Summary: refill/ hla  Phone Note Refill Request Message from:  Fax from Pharmacy on May 23, 2009 12:02 PM  Refills Requested: Medication #1:  DARVOCET-N 100 100-650 MG TABS Take one tablet by mouth every six hours as needed for pain.   Last Refilled: 4/21 Initial call taken by: Marin Roberts RN,  May 23, 2009 12:03 PM  Follow-up for Phone Call        Refill approved-nurse to complete Follow-up by: Joaquin Courts  MD,  May 24, 2009 8:28 AM  Additional Follow-up for Phone Call Additional follow up Details #1::        Rx called to pharmacy Additional Follow-up by: Marin Roberts RN,  May 24, 2009 1:49 PM      Prescriptions: DARVOCET-N 100 100-650 MG TABS (PROPOXYPHENE N-APAP) Take one tablet by mouth every six hours as needed for pain.  #90 x 1   Entered and Authorized by:   Joaquin Courts  MD   Signed by:   Joaquin Courts  MD on 05/24/2009   Method used:   Telephoned to ...       [facility] Department (retail)       [address] [place], [place]  [address]       Ph: [phone]       Fax: [phone]   RxID:   [id number]

## 2011-01-23 NOTE — Progress Notes (Signed)
Summary: Prescriptions  Phone Note Outgoing Call   Call placed by: Angelina Ok RN,  February 28, 2009 4:03 PM Call placed to: Patient Summary of Call: RTC to pt informed of need to take medication for her UTI and Bacterial infection.  [facility]  Pt says that she is in a lot of pain and needs something for her headaches.  Says taht she cannot afford Imitrex and would like to get a refill on nher Darvocet.  Pt was informed that the doctor said that it was too early.  pt said that she has to take the Darvocet 3 times a day to get relief from her headaches.  Precriptions for Metronidazole 500 mg 1 by mouth two times a day and Bactrium 400-80 mg tablets 1 by mouth two times a day x 3 days [facility] order of Dr. Elvera Lennox.  Angelina Ok RN  February 28, 2009 4:09 PM  Initial call taken by: Angelina Ok RN,  February 28, 2009 4:09 PM  Follow-up for Phone Call        She can have the darvocet three times a day but please let her know she needs an appt to be evaluated for her HA's.  Follow-up by: Joaquin Courts  MD,  March 02, 2009 2:18 PM  Additional Follow-up for Phone Call Additional follow up Details #1::        Phone call completed Additional Follow-up by: Merrie Roof RN,  March 02, 2009 4:44 PM      Prescriptions: DARVOCET-N 100 100-650 MG TABS (PROPOXYPHENE N-APAP) Take one tablet by mouth every six hours as needed for pain.  #90 x 1   Entered and Authorized by:   Joaquin Courts  MD   Signed by:   Joaquin Courts  MD on 03/02/2009   Method used:   Telephoned to ...       [facility] Department (retail)       [address] [place], [place]  [address]       Ph: [phone]       Fax: [phone]   RxID:   [id number]

## 2011-01-23 NOTE — Consult Note (Signed)
Summary: New Pt Evaluation  New Pt Evaluation   Imported By: Florinda Marker 03/12/2010 14:57:02  _____________________________________________________________________  External Attachment:    Type:   Image     Comment:   External Document

## 2011-01-23 NOTE — Assessment & Plan Note (Signed)
Summary: est-ck/fu/meds/possible uti per pt/cfb   Vital Signs:  Patient profile:   55 year old female Height:      63 inches (160.02 cm) Weight:      189.5 pounds (86.14 kg) BMI:     33.69 Temp:     97.4 degrees F (36.33 degrees C) oral Pulse rate:   67 / minute BP sitting:   112 / 75  (left arm)  Vitals Entered By: Stanton Kidney Ditzler RN (November 07, 2009 1:36 PM) Is Patient Diabetic? No Pain Assessment Patient in pain? no      Nutritional Status BMI of > 30 = obese Nutritional Status Detail appetite down  Have you ever been in a relationship where you felt threatened, hurt or afraid?denies   Does patient need assistance? Functional Status Self care Ambulation Normal Comments Pains in legs past 4 weeks mostly in AM. Problems walking.   Primary Care Provider:  Joaquin Courts  MD   History of Present Illness: Pt is a 55 yo female w/ past med hx below here for evaluation of feeling "drunk" all the time and havng difficulty w/ her balance.  She has HA's that are chronic.  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  She denies back pain but has occasional pain in her bilateral thighs, that is worse when sitting in bathtubs.    She also c/o dysuria that started 1 month ago.  She has noted a slight increase in her urinary frequency.  No fevers.  Slight white vaginal d/c and pain in the vaginal area.  She denies vaginal bleeding.  She is sexually active.  She also notes she "sometimes feel like I'm about to lose it."  No SI/HI. Still feeling depressed and doesn't think any of her meds are helping.  Depression History:      The patient denies a depressed mood most of the day and a diminished interest in her usual daily activities.         Preventive Screening-Counseling & Management  Alcohol-Tobacco     Smoking Status: never  Caffeine-Diet-Exercise     Does Patient Exercise: no  Current Medications (verified): 1)  Coreg  6.25 Mg Tabs (Carvedilol) .... Take 1 Tablet By Mouth Two Times A Day 2)  Benazepril Hcl 40 Mg Tabs (Benazepril Hcl) .... Take 1 Tablet By Mouth Once A Day For Blood Pressure. Take This After Finishing The Lisinopril 3)  Antivert 12.5 Mg Tabs (Meclizine Hcl) .Marland Kitchen.. 1-2 Tablets Every 4 Hours As Needed For Dizziness 4)  Klonopin 1 Mg Tabs (Clonazepam) .... Take 1 Tablet By Mouth Three Times A Day 5)  Maxzide-25 37.5-25 Mg  Tabs (Triamterene-Hctz) .... Take One Tablet By Mouth Daily. 6)  Pravachol 10 Mg  Tabs (Pravastatin Sodium) .... Take 1 Tablet By Mouth Once A Day Before Bed. 7)  Darvocet-N 100 100-650 Mg Tabs (Propoxyphene N-Apap) .... Take One Tablet By Mouth Every Six Hours As Needed For Pain. 8)  Nitrostat 0.4 Mg  Subl (Nitroglycerin) .... Take One Tab Under The Tongue As Needed For Severe Chest Pain.  Do Not Use More Than One A Day. 9)  Prozac 40 Mg Caps (Fluoxetine Hcl) .... Take 1 Tablet By Mouth Once A Day 10)  Norvasc 5 Mg Tabs (Amlodipine Besylate) .... Take 1 Tablet By Mouth Once A Day 11)  Omeprazole 20 Mg Cpdr (Omeprazole) .... Take 1 Tablet By Mouth Once A Day  Allergies: 1)  ! Cipro  Past History:  Past Medical History: Last updated: 08/25/2008 CAD: S/p cardiac catherization by Dr.Ganji(3/08)          Mild LAD stenosis (60%)          Ef 50-55%          Medical mangement        w/ repeat cath in 12/08 by Dr. Lynnea Ferrier: essentially normal coronary          arteries with a possible 20% mid left anterior descending lesion versus         a myocardial bridge. A f/u myoview as an outpt was to be done which she did have done.   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  Recently laid off from her job.  Risk Factors: Smoking Status: never (11/07/2009)  Review of Systems       As per HPI.  Physical Exam  General:  alert and well-developed, no distress.   Eyes:  anicteric. Mouth:  MMM, poor dentition. Breasts:  No mass, nodules, thickening, tenderness, bulging, retraction, inflamation, nipple discharge or skin changes noted.  intertrigo noted under both breasts. Lungs:  Normal respiratory effort, chest expands symmetrically. Lungs are clear to auscultation, no crackles or wheezes. Heart:  Normal rate and regular rhythm. S1 and S2 normal without gallop, murmur, click, rub or other extra sounds. Abdomen:  +BS's, obese, soft, NT and ND. Genitalia:  normal introitus and no external lesions, slightly yellow cervical d/c noted, otherwise cervix normal, no palpable abnormalities on exam and no cervical motion tenderness..   Msk:  strength intact in all extremities, no joint edema/erythema. Extremities:  no edema.  Neurologic:  alert & oriented X3, cranial nerves II-XII intact, finger-to-nose normal, and heel-to-shin normal, gait normal.   Psych:  responses slow at time, flat affect.   Impression & Recommendations:  Problem # 1:  CONFUSION (ICD-298.9) Pt states she feels like she is drunk sometimes without drinking.  Most likely causes include medication effect or psychiatric condition.  Will cut back on her clonopin for now, as she states this is not helping her anxiety symptoms anyway, to see if this helps.  I have also referred her to psych, which she has been resistant to doing in the past, b/c I think her psychiatric comorbidities are contributing to a lot of her somatic complaints.   Problem # 2:  DYSURIA (ICD-788.1) Will get UA/cx, and did pelvic looking for STI's as she has had this in the past.  F/u below results and tx accordingly.  Orders: T-Chlamydia & GC  Probe, Genital ([id number]) T-Culture, Urine ([id number]) T-Urinalysis ([id number]) T-Wet Prep by Molecular Probe [phone]) T-HIV Antibody  (Reflex) ([id number])  Problem # 3:  GYNECOLOGICAL EXAMINATION, ROUTINE (ICD-V72.31) Pap today, HIV today.  Problem # 4:  ANEMIA (ICD-285.9) Prior hx of anemia in the setting of rectal bleeding but she no-showed to her GI f/u appt.  Will repeat hgb today.  Orders: T-CBC No Diff ([id number])  Problem # 5:  HYPERTENSION (ICD-401.9) At goal.  F/u K/cr today  Her updated medication list for this problem includes:    Coreg 6.25 Mg Tabs (Carvedilol) .Marland Kitchen... Take 1 tablet by mouth two times a day  Benazepril Hcl 40 Mg Tabs (Benazepril hcl) .Marland Kitchen... Take 1 tablet by mouth once a day for blood pressure. take this after finishing the lisinopril    Maxzide-25 37.5-25 Mg Tabs (Triamterene-hctz) .Marland Kitchen... Take one tablet by mouth daily.    Norvasc 5 Mg Tabs (Amlodipine besylate) .Marland Kitchen... Take 1 tablet by mouth once a day  Orders: T-Basic Metabolic Panel [phone])  Problem # 6:  ANXIETY DISORDER, GENERALIZED (ICD-300.02) Her anxiety and depression symptoms are contributing to many of her problems.  For instance, I reviewed her record and she has no-showed/cancelled 28 appts with [place] and this does not include the specialists that she has been referred to.  I have insisted that she see a psychiatrist to help as we have tried multiple SSRI's without improvement in her symptoms but does note she takes the meds.  She denies SI/HI.  Her updated medication list for this problem includes:    Klonopin 1 Mg Tabs (Clonazepam) .Marland Kitchen... Take 1 tablet by mouth two times a day    Prozac 40 Mg Caps (Fluoxetine hcl) .Marland Kitchen... Take 1 tablet by mouth once a day  Orders: Psychiatric Referral (Psych)  Complete Medication List: 1)  Coreg 6.25 Mg Tabs (Carvedilol) .... Take 1 tablet by mouth two times a day 2)  Benazepril Hcl 40 Mg Tabs (Benazepril hcl) .... Take 1 tablet by  mouth once a day for blood pressure. take this after finishing the lisinopril 3)  Antivert 12.5 Mg Tabs (Meclizine hcl) .Marland Kitchen.. 1-2 tablets every 4 hours as needed for dizziness 4)  Klonopin 1 Mg Tabs (Clonazepam) .... Take 1 tablet by mouth two times a day 5)  Maxzide-25 37.5-25 Mg Tabs (Triamterene-hctz) .... Take one tablet by mouth daily. 6)  Pravachol 10 Mg Tabs (Pravastatin sodium) .... Take 1 tablet by mouth once a day before bed. 7)  Darvocet-n 100 100-650 Mg Tabs (Propoxyphene n-apap) .... Take one tablet by mouth every six hours as needed for pain. 8)  Nitrostat 0.4 Mg Subl (Nitroglycerin) .... Take one tab under the tongue as needed for severe chest pain.  do not use more than one a day. 9)  Prozac 40 Mg Caps (Fluoxetine hcl) .... Take 1 tablet by mouth once a day 10)  Norvasc 5 Mg Tabs (Amlodipine besylate) .... Take 1 tablet by mouth once a day 11)  Omeprazole 20 Mg Cpdr (Omeprazole) .... Take 1 tablet by mouth once a day  Other Orders: T-Drug Screen-Urine, (single) [id number]) T-PAP [facility]) [phone])  Patient Instructions: 1)  Please make a followup appointment in 1 month for a checkup. 2)  You will be called with any abnormal labwork.  Please make sure your phone number is correct at the front desk. 3)  Please see the psychiatrist regarding your symptoms. 4)  We are decreasing your xanax because you are having side effects from it.  Process Orders Check Orders Results:     [facility]: ABN not required for this insurance Tests Sent for requisitioning (November 08, 2009 7:49 AM):     11/07/2009: [facility] -- T-Chlamydia & GC Probe, Genital [[id number]] (signed)     11/07/2009: [facility] -- T-Culture, Urine [[id number]] (signed)     11/07/2009: [facility] -- T-Urinalysis [[id number]] (signed)     11/07/2009: [facility] -- T-Wet Prep by Molecular Probe [phone] (signed)      11/07/2009: [facility] -- T-Drug Screen-Urine, (single) [[id number]] (signed)     11/07/2009: [facility] -- T-CBC No Diff [  [id number]] (signed)     11/07/2009: [facility] -- T-Basic Metabolic Panel [phone] (signed)     11/07/2009: [facility] -- T-HIV Antibody  (Reflex) [[id number]] (signed)    Prevention & Chronic Care Immunizations   Influenza vaccine: Not documented   Influenza vaccine deferral: Refused  (11/07/2009)    Tetanus booster: Not documented    Pneumococcal vaccine: Not documented  Colorectal Screening   Hemoccult: Not documented    Colonoscopy: Not documented  Other Screening   Pap smear:  Specimen Adequacy: Satisfactory for evaluation.   Interpretation/Result:Negative for intraepithelial Lesion or Malignancy.     (07/20/2008)   Pap smear action/deferral: Ordered  (11/07/2009)   Pap smear due: 07/2008    Mammogram: ASSESSMENT: Negative - BI-RADS 1^MM DIGITAL SCREENING  (04/23/2009)   Smoking status: never  (11/07/2009)  Lipids   Total Cholesterol: 206  (03/28/2009)   LDL: 90  (03/28/2009)   LDL Direct: Not documented   HDL: 75  (03/28/2009)   Triglycerides: 206  (03/28/2009)    SGOT (AST): 18  (03/28/2009)   SGPT (ALT): 13  (03/28/2009)   Alkaline phosphatase: 59  (03/28/2009)   Total bilirubin: 0.3  (03/28/2009)  Hypertension   Last Blood Pressure: 112 / 75  (11/07/2009)   Serum creatinine: 0.97  (03/28/2009)   Serum potassium 4.1  (03/28/2009)  Self-Management Support :    Hypertension self-management support: Not documented    Lipid self-management support: Not documented    Nursing Instructions: Pap smear today   Laboratory Results   Urine Tests  Date/Time Received: 11/07/09 2:21PM Date/Time Reported: same  Routine Urinalysis   Color: yellow Appearance: Clear Glucose: negative   (Normal Range: Negative) Bilirubin: negative   (Normal Range:  Negative) Ketone: negative   (Normal Range: Negative) Spec. Gravity: <1.005   (Normal Range: 1.003-1.035) Blood: negative   (Normal Range: Negative) pH: 7.0   (Normal Range: 5.0-8.0) Protein: negative   (Normal Range: Negative) Urobilinogen: 0.2   (Normal Range: 0-1) Nitrite: negative   (Normal Range: Negative) Leukocyte Esterace: trace   (Normal Range: Negative)

## 2011-01-23 NOTE — Progress Notes (Signed)
  Phone Note Call from Patient   Summary of Call: Pt called very anxious that today, 2 days after she finished her 7 days of Metronidazole for her BV, she started to have diarrhea and to see blood on the toilet paper when she wiped. She also has increased pain in her anal area when she wiped. She had 6 episodes of "diarrhea" meaning stools a little runny, dark brown to dark green, associated with "red blood from her rectum". She has no N/V/AP and takes good p o. She is very adamant that she needs to come to the ED and she wants to let Dr. Andrey Campanile know about this problem. I tried to discuss with her that the trauma from wiping can cause a little bleeding, but she is decided to come to the ED so I will let her be evaluated by an ED physician. Initial call taken by: Carlus Pavlov MD,  April 08, 2008 3:57 PM

## 2011-01-23 NOTE — Progress Notes (Signed)
Summary: Lost medications  Phone Note Call from Patient   Summary of Call: pt says she went out of town and lost all her meds....pt would like to know what can she do... [facility].... [phone] Initial call taken by: [name],  November 18, 2010 8:41 AM  Follow-up for Phone Call        she can call the pharmacy and explain her situation but they are likely not going to replace until time for her refill. he only other option would be to get rx for 1 month sent to local pharmacy and she will have to pay out of [place] for the medications until she can get them again at [facility] pharmacy also notify pt that her labs done on last visit show that her white blood cells are low.  this has been noted previously and pt was to go to hematology/oncology for further workup and determine why these cells are low. she did not go and no showed to several appts.  Just need to let her know that labs still are abnormal and see if she is willing to go to specialist for workup at this time. Follow-up by: Lehman Prom FNP,  November 18, 2010 8:46 AM  Additional Follow-up for Phone Call Additional follow up Details #1::        pt is aware and will call the cancer center to make appt.... pt says she has been seeing them and has not missed any appts Additional Follow-up by: [name],  November 18, 2010 11:08 AM

## 2011-01-23 NOTE — Assessment & Plan Note (Signed)
Summary: EST-CK/FU/MEDS/CFB   Vital Signs:  Patient Profile:   55 Years Old Female Height:     63 inches (160.02 cm) Weight:      192.0 pounds (87.27 kg) Temp:     98.1 degrees F (36.72 degrees C) oral Pulse rate:   79 / minute BP sitting:   133 / 78  (right arm)  Pt. in pain?   yes    Location:   right breast    Intensity:   3  Vitals Entered By: Krystal Eaton Duncan Dull) (October 19, 2007 3:06 PM)              Is Patient Diabetic? No  Have you ever been in a relationship where you felt threatened, hurt or afraid?No   Does patient need assistance? Functional Status Self care Ambulation Normal     PCP:  Joaquin Courts  MD  Chief Complaint:  routine ck, pt stopped taking potassium pills x40mths now, and right breast pain x2days.  History of Present Illness: Pt is a 55 yo female with a PMHx of CAD s/p cath in 03/08 with medical mgt, htn, hyperlipidemia, and BPV here for routine checkup.  She would also like to get evaluated for R breast pain just superior and lateral to the nipple  that started two nights ago when it awoke her from sleep.  She denies any palpable masses or discharge.  Her pain is in the actual breast, not the chest, and is not pleuritic in nature.  She states it is tender to the touch.  She denies other systemic symptoms at this time.  She can not identify any exacerbating factors and notes minimal improvement in her symptoms with Tylenol.    Pt also notes that she feels like her potassium is low b/c of an episode of perioral parasthesias.  She notes that she stopped taking her zocor b/c of myalgias approximately 2 1/2 months ago.  She also c/o HA's that are at the top of her head without any other alarming symptoms to include neurological symptoms, fever, or worse in the a.m.      Current Allergies: No known allergies   Past Medical History:    Reviewed history from 05/11/2007 and no changes required:       CAD: S/p cardiac catherization by Dr.Ganji(3/08)               Mild LAD stenosis (60%)                Ef 50-55%                Medical mangement       Hypertension       White matter disease, brainstem auditory evoked response test normal 01/2004       Dental caries with gingivitis       BPPV       Chronic dizziness, evaluated by neuro       Insomnia       Generalized anxiety disorder with hx of Valium dependence  Past Surgical History:    Reviewed history from 05/11/2007 and no changes required:       Tubal ligation   Family History:    Reviewed history and no changes required:       Mother, son, and brother with htn.         No family hx of MI, DM, or breast cancer.    Social History:    Reviewed history from 07/07/2007 and no changes required:  Never smoked, no alcohol, divorced.     Risk Factors:  Tobacco use:  never Alcohol use:  no Exercise:  no Seatbelt use:  100 %  PAP Smear History:    Date of Last PAP Smear:  07/08/2007   Review of Systems       As per HPI.     Physical Exam  General:     Alert, no distress.   Head:     Pax/AT.  No neck stiffness.   Mouth:     Poor dentition. Breasts:     Skin/areolae normal, no masses, no abnormal thickening, and no nipple discharge.  No erythema, edema,  or vesicles seen.  There was tenderness to palpation just superior and around to the right of the nipple.   Lungs:     Normal respiratory effort, clear to auscultation bilaterally.   Heart:     RRR, no m/r/g.   Abdomen:     +BS's, soft, NT, ND.   Extremities:     No edema.      Impression & Recommendations:  Problem # 1:  BREAST PAIN, RIGHT (ICD-611.71) Pt is very concerned about this breast pain.  No palpable masses, erythema, edema, or vesicles  on physical exam.  Feel like this is most likely musculoskeletal in etiology, esp given repoducibility of the pain.  Will also get a diagnostic mammogram, esp since pt's last mammogram was several years ago.  Will give ibuprofen 800 mg bid for pain.    [phone].    Problem # 2:  SYMPTOM, HEADACHE (ICD-784.0) Feel like her HA's are likely tension HA's.  Would also consider migraines in the differential.  Pt reports photophobia during HA's, but only with bright lights.  She has had similar HA's over the past year.  Will give ibu 800 mg two times a day.     Her updated medication list for this problem includes:    Coreg 6.25 Mg Tabs (Carvedilol) .Marland Kitchen... Take 1 tablet by mouth two times a day    Ibuprofen 800 Mg Tabs (Ibuprofen) .Marland Kitchen... Take one tablet twice a day as needed for pain.  The following medications were removed from the medication list:    Darvocet-n 100 100-650 Mg Tabs (Propoxyphene n-apap) .Marland Kitchen... Take 1 tablet every 6 hours as needed for headache  Her updated medication list for this problem includes:    Coreg 6.25 Mg Tabs (Carvedilol) .Marland Kitchen... Take 1 tablet by mouth two times a day    Ibuprofen 800 Mg Tabs (Ibuprofen) .Marland Kitchen... Take one tablet twice a day as needed for pain.   Problem # 3:  MUSCLE PAIN (ICD-729.1) Pt reports discontinuing statin b/c of myalgias.  Will check a CK level today.  Will try pravachol as it is less likely to cause myalgias then zocor.  Will also check electrolytes.  Suggested pt RTC if she develops symptoms rather than discontinuing medications on her own b/c her symptoms need to be investigated by a healthcare professional when they are occuring to best account for the etiology.    Her updated medication list for this problem includes:    Ibuprofen 800 Mg Tabs (Ibuprofen) .Marland Kitchen... Take one tablet twice a day as needed for pain.  Orders: T-CK Total [id number])  The following medications were removed from the medication list:    Darvocet-n 100 100-650 Mg Tabs (Propoxyphene n-apap) .Marland Kitchen... Take 1 tablet every 6 hours as needed for headache  Her updated medication list for this problem includes:    Ibuprofen 800 Mg  Tabs (Ibuprofen) .Marland Kitchen... Take one tablet twice a day as needed for pain.   Problem # 4:   HYPERTENSION (ICD-401.9) BP today well controlled.  Pt reports that she is tolerating her medications well.  Will check BMET today and cont current regimen if everything is good.    Her updated medication list for this problem includes:    Coreg 6.25 Mg Tabs (Carvedilol) .Marland Kitchen... Take 1 tablet by mouth two times a day    Benazepril Hcl 40 Mg Tabs (Benazepril hcl) .Marland Kitchen... Take 1 tablet by mouth once a day for blood pressure. take this after finishing the lisinopril    Hydrochlorothiazide 25 Mg Tabs (Hydrochlorothiazide) .Marland Kitchen... Take 1 tablet by mouth once a day  Orders: T-Comprehensive Metabolic Panel ([id number])  BP today: 133/78 Prior BP: 136/95 (07/07/2007)  Labs Reviewed: Creat: 0.82 (06/11/2007)   Problem # 5:  HYPERLIPIDEMIA (ICD-272.4) Pt discontinued zocor b/c of myalgias.  Will check CMET and lipid panel today to aim to a goal LDL <70 given known CAD and medical mgt.  Will start pravastatin b/c less likely to cause myalgias.    The following medications were removed from the medication list:    Zocor 40 Mg Tabs (Simvastatin) .Marland Kitchen... Take 1 tablet by mouth once a day  Her updated medication list for this problem includes:    Pravachol 10 Mg Tabs (Pravastatin sodium) .Marland Kitchen... Take 1 tablet by mouth once a day before bed.  Orders: T-Lipid Profile [id number])   Problem # 6:  Preventive Health Care (ICD-V70.0) Last pap 07/08.  No mammogram in several years, so will check today.  Gave stool cards and encouraged pt to return them.  She has never had a colonoscopy and has no insurance.  Will check stool cards for now.    Problem # 7:  ANXIETY DISORDER, GENERALIZED (ICD-300.02) Pt has been on stable doses of valium and informed her that I would continue to refill this medication as long as she was not having symptoms and not requiring escalating doses.    Her updated medication list for this problem includes:    Valium 2 Mg Tabs (Diazepam) .Marland Kitchen... Take 1 tablet by mouth two times a day as  needed anxiety    Effexor Xr 75 Mg Cp24 (Venlafaxine hcl) .Marland Kitchen... Take 2 capsules by mouth once a day   Complete Medication List: 1)  Coreg 6.25 Mg Tabs (Carvedilol) .... Take 1 tablet by mouth two times a day 2)  Benazepril Hcl 40 Mg Tabs (Benazepril hcl) .... Take 1 tablet by mouth once a day for blood pressure. take this after finishing the lisinopril 3)  Antivert 12.5 Mg Tabs (Meclizine hcl) .Marland Kitchen.. 1-2 tablets every 4 hours as needed for dizziness 4)  Valium 2 Mg Tabs (Diazepam) .... Take 1 tablet by mouth two times a day as needed anxiety 5)  Effexor Xr 75 Mg Cp24 (Venlafaxine hcl) .... Take 2 capsules by mouth once a day 6)  Hydrochlorothiazide 25 Mg Tabs (Hydrochlorothiazide) .... Take 1 tablet by mouth once a day 7)  Pravachol 10 Mg Tabs (Pravastatin sodium) .... Take 1 tablet by mouth once a day before bed. 8)  Ibuprofen 800 Mg Tabs (Ibuprofen) .... Take one tablet twice a day as needed for pain.  Other Orders: T-Hemoccult Cards-Multiple ([id number])  Future Orders: Mammogram (Diagnostic) (Mammo) ... 10/22/2007   Patient Instructions: 1)  Please followup with your primary care doctor in 3 months BP check. 2)  Please start taking pravastatin at night before bed. 3)  Please  get your mammogram done.    Prescriptions: IBUPROFEN 800 MG  TABS (IBUPROFEN) Take one tablet twice a day as needed for pain.  #20 x 0   Entered and Authorized by:   Joaquin Courts  MD   Signed by:   Joaquin Courts  MD on 10/20/2007   Method used:   Print then Give to Patient   RxID:   [id number] PRAVACHOL 10 MG  TABS (PRAVASTATIN SODIUM) Take 1 tablet by mouth once a day before bed.  #30 x 5   Entered and Authorized by:   Joaquin Courts  MD   Signed by:   Joaquin Courts  MD on 10/20/2007   Method used:   Print then Give to Patient   RxID:   [id number]  ]

## 2011-01-23 NOTE — Progress Notes (Signed)
Summary: Prescription  Phone Note Outgoing Call   Action Taken: Assistance medications ready for pick up Summary of Call: Prescription for Clindymycin 150 mg capsules 2 by mouth every 6 hours until gone dispense #56 with no refills per ordeerr of Dr. Andrey Campanile. Angelina Ok RN  December 27, 2008 3:43 PM  Initial call taken by: Angelina Ok RN,  December 27, 2008 3:43 PM

## 2011-01-23 NOTE — Progress Notes (Signed)
Summary: refill/gg  Phone Note Refill Request  on April 11, 2010 2:21 PM  Refills Requested: Medication #1:  PRAVACHOL 10 MG  TABS Take 1 tablet by mouth once a day before bed.   Last Refilled: 02/04/2010  Method Requested: Fax to Local Pharmacy Initial call taken by: Merrie Roof RN,  April 11, 2010 2:22 PM    Prescriptions: PRAVACHOL 10 MG  TABS (PRAVASTATIN SODIUM) Take 1 tablet by mouth once a day before bed.  #30 x 0   Entered and Authorized by:   Joaquin Courts  MD   Signed by:   Joaquin Courts  MD on 04/11/2010   Method used:   Faxed to ...       [facility] (retail)       [address] [place], [place]  [address]       Ph: [phone]       Fax: [phone]   RxID:   [id number] PRAVACHOL 10 MG  TABS (PRAVASTATIN SODIUM) Take 1 tablet by mouth once a day before bed.  #30 x 0   Entered and Authorized by:   Joaquin Courts  MD   Signed by:   Joaquin Courts  MD on 04/11/2010   Method used:   Telephoned to ...       [facility] Department (retail)       [address] [place], [place]  [address]       Ph: [phone]       Fax: [phone]   RxID:   [id number]

## 2011-01-23 NOTE — Progress Notes (Signed)
Summary: phone/gg  Phone Note Call from Patient   Caller: Patient Summary of Call: Pt called and feels the Cipro she is taking is causing some SOB and " a little  amount of heart palpations" she has been taking for 4 days and had unset of sx 2 days.  She has 3 days of antibiotics left.   She is wondering if this is anxiety. SHe has no other compllaints. Please advise. Initial call taken by: Merrie Roof RN,  April 04, 2008 9:01 AM  Follow-up for Phone Call        She can stop the cipro.  4 days usually cures UTIs and her evidence for UTI was not too impressive to begin with.  This is very unlikely to be an "allergy" to cipro, and I would not label it as such. Follow-up by: Ulyess Mort MD,  April 04, 2008 9:55 AM  Additional Follow-up for Phone Call Additional follow up Details #1::        Discussed witht  the patient. She had episode similar to past "anxiety attacks" with dyspnea, palpitations. The onset came BEFORE takingher morning dose of flagyl. She took diazepam and the symptoms abated. I adivsed her that was not c/w an allergic reaction to flagyl. She is having metallic taste in mouth and GI discofort c/w SE of flagyl. Advised her to continue the medicine but if she has recurrence of symptoms that DO NOT go away with diazepam or she has other symptoms such as rash, swelling to let us know and if necessary seek emergency medical attention. Additional Follow-up by: Acey Lav MD,  April 04, 2008 2:52 PM

## 2011-01-23 NOTE — Assessment & Plan Note (Signed)
Summary: ACUTE-POSSIBLE STD SMELLY URINE AND ITCHING/CFB/WILSON   Vital Signs:  Patient profile:   55 year old female Height:      63 inches (160.02 cm) Weight:      200.3 pounds (91.05 kg) BMI:     35.61 Temp:     97.0 degrees F (36.11 degrees C) oral Pulse rate:   72 / minute BP sitting:   164 / 102  (right arm)  Vitals Entered By: Filomena Jungling NT II (February 27, 2009 9:31 AM) CC: FOUL SMEELING URINE/ DISCHARGE Is Patient Diabetic? No Pain Assessment Patient in pain? no      Nutritional Status BMI of > 30 = obese  Have you ever been in a relationship where you felt threatened, hurt or afraid?No   Does patient need assistance? Functional Status Self care Ambulation Normal   Primary Care Provider:  Joaquin Courts  MD   History of Present Illness: This is a 55   y/o woman with PMH of  CAD: S/p cardiac catherization by Dr.Ganji(3/08)          Mild LAD stenosis (60%)          Ef 50-55%          Medical mangement        w/ repeat cath in 12/08 by Dr. Lynnea Ferrier: essentially normal coronary          arteries with a possible 20% mid left anterior descending lesion versus         a myocardial bridge. A f/u myoview as an outpt was to be done which she did have done.   Hypertension White matter disease, brainstem auditory evoked response test normal 01/2004 Dental caries with gingivitis BPPV Chronic dizziness, evaluated by neuro Insomnia Generalized anxiety disorder with hx of Valium dependence FOBT + stools elevated D dimer with multiple negative CTAs presenting for foul smelling urine and vaginal itching.  She and boyfriend did not use protection with sexual intercourse 2 weeks ago. Two days after, she started to have vaginal  itching and burning and yellow discharge. She also has a little low abd. pressure with this. Has dysuria.  She goes through a lot (being let go from the job, in the process of being evicted) so she feels stressed. She did not make it to the dentist or  get the clindamycin that  was prescribed for toothache.    Preventive Screening-Counseling & Management     Smoking Status: never  Current Medications (verified): 1)  Coreg 6.25 Mg Tabs (Carvedilol) .... Take 1 Tablet By Mouth Two Times A Day 2)  Benazepril Hcl 40 Mg Tabs (Benazepril Hcl) .... Take 1 Tablet By Mouth Once A Day For Blood Pressure. Take This After Finishing The Lisinopril 3)  Antivert 12.5 Mg Tabs (Meclizine Hcl) .Marland Kitchen.. 1-2 Tablets Every 4 Hours As Needed For Dizziness 4)  Klonopin 1 Mg Tabs (Clonazepam) .... Take 1 Tablet By Mouth Two Times A Day 5)  Maxzide-25 37.5-25 Mg  Tabs (Triamterene-Hctz) .... Take One Tablet By Mouth Daily. 6)  Pravachol 10 Mg  Tabs (Pravastatin Sodium) .... Take 1 Tablet By Mouth Once A Day Before Bed. 7)  Darvocet-N 100 100-650 Mg Tabs (Propoxyphene N-Apap) .... Take One Tablet By Mouth Every Six Hours As Needed For Pain. 8)  Nitrostat 0.4 Mg  Subl (Nitroglycerin) .... Take One Tab Under The Tongue As Needed For Severe Chest Pain.  Do Not Use More Than One A Day. 9)  Prednisone 20 Mg  Tabs (  Prednisone) .... Take 1 Tablet Daily By Mouth. 10)  Imitrex 50 Mg Tabs (Sumatriptan Succinate) .... Take One Pill For Headache.  If No Relief After One Hour, Take One More Pill.  Do Not Take More Than Three Pills A Day. 11)  Prozac 20 Mg Caps (Fluoxetine Hcl) .... Take 1 Tablet By Mouth Every Morning  Allergies: 1)  ! Cipro  Physical Exam  General:  alert and overweight-appearing.   Eyes:  vision grossly intact, pupils equal, pupils round, and pupils reactive to light.   Mouth:  pharynx pink and moist.   Lungs:  normal respiratory effort, no crackles, and no wheezes.   Heart:  normal rate, regular rhythm, no murmur, and no gallop.   Abdomen:  soft, non-tender, normal bowel sounds, no distention, no guarding, and no rigidity.   Genitalia:  Normal introitus for age, no external lesions, no vaginal discharge, mucosa pink and moist, no vaginal or cervical  lesions, no vaginal atrophy, no friaility or hemorrhage, normal uterus size and position, no adnexal masses or tenderness   Review of Systems       No F/C/N/V/blood in stool  Has diarrhea every now and then.   Impression & Recommendations:  Problem # 1:  DYSURIA (ICD-788.1) Assessment Deteriorated Pt did not take the Clindamycin, so less likely candidosis. Likely has  a mild UTI - check U/A. However, she would want me to test her for STDs. I explained to her that, to be complete, we would need to test her for HIV, RPR and Hep B but she would want to forego these blood tests for now. Had a neg. HIV test in 09/2009 and she had sex with same partner since. She had a PAP smear in 06/2008, will not repeat. I will wait for the results before starting any tx.  The following medications were removed from the medication list:    Clindamycin Hcl 150 Mg Caps (Clindamycin hcl) .Marland Kitchen..Marland Kitchen Two tabs by mouth every 6 hours until gone.  Orders: T-GC Probe, genital [phone]) T-Chlamydia ([id number]) T-Wet Prep by Molecular Probe [phone]) T-Urinalysis [phone])  Problem # 2:  HYPERTENSION (ICD-401.9) Assessment: Deteriorated BP elevated, now as last time (see below). [facility]vasc [facility]. Will call back  Her updated medication list for this problem includes:    Coreg 6.25 Mg Tabs (Carvedilol) .Marland Kitchen... Take 1 tablet by mouth two times a day    Benazepril Hcl 40 Mg Tabs (Benazepril hcl) .Marland Kitchen... Take 1 tablet by mouth once a day for blood pressure. take this after finishing the lisinopril    Maxzide-25 37.5-25 Mg Tabs (Triamterene-hctz) .Marland Kitchen... Take one tablet by mouth daily.    Norvasc 5 Mg Tabs (Amlodipine besylate) .Marland Kitchen... Take 1 tablet by mouth once a day  BP today: 164/102 Prior BP: 158/108 (12/25/2008)  Labs Reviewed: Creat: 1.02 (12/25/2008) Chol: 220 (10/19/2007)   HDL: 40 (10/19/2007)   LDL: 113 (10/19/2007)   TG: 334 (10/19/2007)  Her updated medication  list for this problem includes:    Coreg 6.25 Mg Tabs (Carvedilol) .Marland Kitchen... Take 1 tablet by mouth two times a day    Benazepril Hcl 40 Mg Tabs (Benazepril hcl) .Marland Kitchen... Take 1 tablet by mouth once a day for blood pressure. take this after finishing the lisinopril    Maxzide-25 37.5-25 Mg Tabs (Triamterene-hctz) .Marland Kitchen... Take one tablet by mouth daily.  Problem # 3:  SYMPTOM, HEADACHE (ICD-784.0) Assessment: Comment Only Did not get the Imitrex as [facility]  Not time to refill her Darvocet yet.  Her updated medication list for this problem includes:    Coreg 6.25 Mg Tabs (Carvedilol) .Marland Kitchen... Take 1 tablet by mouth two times a day    Darvocet-n 100 100-650 Mg Tabs (Propoxyphene n-apap) .Marland Kitchen... Take one tablet by mouth every six hours as needed for pain.    Imitrex 50 Mg Tabs (Sumatriptan succinate) .Marland Kitchen... Take one pill for headache.  if no relief after one hour, take one more pill.  do not take more than three pills a day.  Complete Medication List: 1)  Coreg 6.25 Mg Tabs (Carvedilol) .... Take 1 tablet by mouth two times a day 2)  Benazepril Hcl 40 Mg Tabs (Benazepril hcl) .... Take 1 tablet by mouth once a day for blood pressure. take this after finishing the lisinopril 3)  Antivert 12.5 Mg Tabs (Meclizine hcl) .Marland Kitchen.. 1-2 tablets every 4 hours as needed for dizziness 4)  Klonopin 1 Mg Tabs (Clonazepam) .... Take 1 tablet by mouth two times a day 5)  Maxzide-25 37.5-25 Mg Tabs (Triamterene-hctz) .... Take one tablet by mouth daily. 6)  Pravachol 10 Mg Tabs (Pravastatin sodium) .... Take 1 tablet by mouth once a day before bed. 7)  Darvocet-n 100 100-650 Mg Tabs (Propoxyphene n-apap) .... Take one tablet by mouth every six hours as needed for pain. 8)  Nitrostat 0.4 Mg Subl (Nitroglycerin) .... Take one tab under the tongue as needed for severe chest pain.  do not use more than one a day. 9)  Prednisone 20 Mg Tabs (Prednisone) .... Take 1 tablet daily by mouth. 10)  Imitrex  50 Mg Tabs (Sumatriptan succinate) .... Take one pill for headache.  if no relief after one hour, take one more pill.  do not take more than three pills a day. 11)  Prozac 20 Mg Caps (Fluoxetine hcl) .... Take 1 tablet by mouth every morning 12)  Norvasc 5 Mg Tabs (Amlodipine besylate) .... Take 1 tablet by mouth once a day  Patient Instructions: 1)  Please schedule a follow-up appointment in 1 month for blood pressure check. 2)  We will call you with the results of your tests. 3)  The medication list was reviewed and reconciled.  All changed / newly prescribed medications were explained.  A complete medication list was provided to the patient / caregiver.  Prescriptions: PRAVACHOL 10 MG  TABS (PRAVASTATIN SODIUM) Take 1 tablet by mouth once a day before bed.  #32 x 6   Entered and Authorized by:   Carlus Pavlov MD   Signed by:   Carlus Pavlov MD on 02/27/2009   Method used:   Print then Give to Patient   RxID:   [id number] MAXZIDE-25 37.5-25 MG  TABS (TRIAMTERENE-HCTZ) Take one tablet by mouth daily.  #30 x 8   Entered and Authorized by:   Carlus Pavlov MD   Signed by:   Carlus Pavlov MD on 02/27/2009   Method used:   Print then Give to Patient   RxID:   [id number] NORVASC 5 MG TABS (AMLODIPINE BESYLATE) Take 1 tablet by mouth once a day  #30 x 5   Entered and Authorized by:   Carlus Pavlov MD   Signed by:   Carlus Pavlov MD on 02/27/2009   Method used:   Print then Give to Patient   RxID:   [id number]

## 2011-01-23 NOTE — Progress Notes (Signed)
Summary: refill/gg  Phone Note Refill Request  on August 20, 2007 4:09 PM  Refills Requested: Medication #1:  PROMETHAZINE HCL 25 MG TABS Take one tablet every 12 hours as needed for nausea   Last Refilled: 05/21/2007  Medication #2:  BENAZEPRIL HCL 40 MG TABS Take 1 tablet by mouth once a day for blood pressure. Take this after finishing the Lisinopril  Medication #3:  VALIUM 2 MG TABS Take 1 tablet by mouth two times a day as needed anxiety   Last Refilled: 07/12/2007 Health Dept  **  # 90           6/20 b met  Initial call taken by: Merrie Roof RN,  August 20, 2007 4:10 PM  Follow-up for Phone Call        Refill approved-nurse to complete. Please schedule an appointment with patient's primary MD.  Follow-up by: Margarito Liner MD,  August 20, 2007 5:31 PM  Additional Follow-up for Phone Call Additional follow up Details #1::        Rx faxed to pharmacy Additional Follow-up by: Merrie Roof RN,  August 24, 2007 8:46 AM    Additional Follow-up for Phone Call Additional follow up Details #2::    Will adjust the diazepam to cover a month. Change from 30 tablets to 60 tablets (pt takes two times a day). Follow-up by: Eliseo Gum MD,  August 24, 2007 9:14 AM  Additional Follow-up for Phone Call Additional follow up Details #3:: Details for Additional Follow-up Action Taken: Faxed Rx into health Dept.  Called [facility] and they said they did not receive Rx for these meds. Additional Follow-up by: Merrie Roof RN,  August 24, 2007 9:26 AM    Prescriptions: VALIUM 2 MG TABS (DIAZEPAM) Take 1 tablet by mouth two times a day as needed anxiety  #60 x 0   Entered and Authorized by:   Eliseo Gum MD   Signed by:   Eliseo Gum MD on 08/24/2007   Method used:   Telephoned to ...       [facility] [address]*       [address]       [place], [place]  [address]       Ph:        Fax:    RxID:   [id number] PROMETHAZINE HCL 25 MG TABS (PROMETHAZINE  HCL) Take one tablet every 12 hours as needed for nausea  #20 x 0   Entered and Authorized by:   Margarito Liner MD   Signed by:   Margarito Liner MD on 08/20/2007   Method used:   Telephoned to ...       [facility] [address]       [address]       [place], [place]  [address]       Ph:        Fax:    RxID:   [id number] VALIUM 2 MG TABS (DIAZEPAM) Take 1 tablet by mouth two times a day as needed anxiety  #30 x 0   Entered and Authorized by:   Margarito Liner MD   Signed by:   Margarito Liner MD on 08/20/2007   Method used:   Telephoned to ...       [facility] [address]       [address]       [place], [place]  [address]       Ph:  Fax:    RxID:   [id number] BENAZEPRIL HCL 40 MG TABS (BENAZEPRIL HCL) Take 1 tablet by mouth once a day for blood pressure. Take this after finishing the Lisinopril  #30 x 0   Entered and Authorized by:   Margarito Liner MD   Signed by:   Margarito Liner MD on 08/20/2007   Method used:   Telephoned to ...       [facility] [address]*       [address]       [place], [place]  [address]       Ph:        Fax:    RxID:   [id number]

## 2011-01-23 NOTE — Assessment & Plan Note (Signed)
Summary: h/a's, c/o on call md/pcp-Arrion Burruel/hla   Vital Signs:  Patient Profile:   55 Years Old Female Height:     63 inches (160.02 cm) Weight:      189.6 pounds (86.18 kg) BMI:     33.71 Temp:     97.7 degrees F (36.50 degrees C) oral Pulse rate:   109 / minute BP sitting:   158 / 108  (right arm)  Pt. in pain?   yes    Location:   left jaw    Intensity:   10  Vitals Entered By: Stanton Kidney Ditzler RN (December 25, 2008 3:10 PM)              Is Patient Diabetic? No Nutritional Status BMI of > 30 = obese Nutritional Status Detail appetite fair  Have you ever been in a relationship where you felt threatened, hurt or afraid?denies   Does patient need assistance? Functional Status Self care Ambulation Normal     PCP:  Joaquin Courts  MD  Chief Complaint:  Refills on meds and discuss nerve and h/a meds. Left jaw locked since on Prednisone and mouth dry and h/a are worse. Left side neck swollen.Marland Kitchen  History of Present Illness: Pt is a 55 yo female w/ past medical history of  CAD: S/p cardiac catherization by Dr.Ganji(3/08)          Mild LAD stenosis (60%)          Ef 50-55%          Medical mangement        w/ repeat cath in 12/08 by Dr. Lynnea Ferrier: essentially normal coronary          arteries with a possible 20% mid left anterior descending lesion versus         a myocardial bridge. A f/u myoview as an outpt was to be done which she did have done.   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  She notes that her HA's are worse after starting prednisone.  The are located mostly on the L side but only occur every once in a while on the R.  She also notes that her jaw has been locking up on the L side and this  started after the prednisone.  She says it feels like a bone is out of place.  She also notes that she has L sided neck pain and swelling and itching.  Her dizzy spells have also started to recur.  Her HA's occur every other day.  Darvocet helps but ibu and tylenol have not worked.  She has also had blurry vision intermittently in both eyes.  No photophobia.  HA's aren't worse at any time of the day.  She also notes L upper extremity weakness for a long time.  She also has R lower tooth pain that has been ongoing for the last week.   She also notes that her newer nerve pill is not working.  She is having panic attacks 2 x's a day.  She gets SOB and hot.  No chest pain.  She feels really anxious.   She denies depressive symptoms.  She lost her job three months ago and hasnt found another one, but no other social stressors.    Of  note, she has been out of her blood pressure medications for two days.     Prior Medications Reviewed Using: Patient Recall  Updated Prior Medication List: COREG 6.25 MG TABS (CARVEDILOL) Take 1 tablet by mouth two times a day BENAZEPRIL HCL 40 MG TABS (BENAZEPRIL HCL) Take 1 tablet by mouth once a day for blood pressure. Take this after finishing the Lisinopril ANTIVERT 12.5 MG TABS (MECLIZINE HCL) 1-2 tablets every 4 hours as needed for dizziness KLONOPIN 0.5 MG TABS (CLONAZEPAM) Take 1 tablet by mouth two times a day MAXZIDE-25 37.5-25 MG  TABS (TRIAMTERENE-HCTZ) Take one tablet by mouth daily. PRAVACHOL 10 MG  TABS (PRAVASTATIN SODIUM) Take 1 tablet by mouth once a day before bed. DARVOCET A500 100-500 MG  TABS (PROPOXYPHENE N-APAP) take one tab by mouth once every 6 hours as needed for headache NITROSTAT 0.4 MG  SUBL (NITROGLYCERIN) Take one tab under the tongue as needed for severe chest pain.  Do not use more than one a day. PREDNISONE 20 MG  TABS (PREDNISONE) take 2 tablet daily by mouth.  Current Allergies (reviewed today): ! CIPRO  Past Medical History:     Reviewed history from 08/25/2008 and no changes required:       CAD: S/p cardiac catherization by Dr.Ganji(3/08)                Mild LAD stenosis (60%)                Ef 50-55%                Medical mangement              w/ repeat cath in 12/08 by Dr. Lynnea Ferrier: essentially normal coronary                arteries with a possible 20% mid left anterior descending lesion versus               a myocardial bridge. A f/u myoview as an outpt was to be done which she did have done.         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         No family hx of MI, DM, or breast cancer.    Social History:    Reviewed history from 09/21/2008 and no changes required:       Never smoked, no alcohol, divorced.  Recently laid off from her job.   Risk Factors: Tobacco use:  never Alcohol use:  no Exercise:  no Seatbelt use:  100 %  PAP Smear History:    Date of Last PAP Smear:  07/20/2008   Review of Systems       As per HPI.   Physical Exam  General:     Alert, oriented, no distress.  Head:     [place]/AT, head wrapped w/  bandana and no evidence of alopecia on removal.  Eyes:     Anicteric, EOMI, PERRL. Ears:     R and L TM's normal.  Mouth:     Upper teeth absent.  Multiple teeth missing on bottom.  Teeth that are present are poorly maintained w/ multiple caries.   Neck:     No LAD.  TTP bilaterally.  Lungs:     Normal respiratory effort, chest expands symmetrically. Lungs are clear to auscultation, no crackles or wheezes. Heart:     Borderline tachycardia, regular  rhythms, no m/r/g.  Abdomen:     Obese, +BS's, soft, NT, and ND.  Extremities:     No pitting edema.  Neurologic:     alert & oriented X3, cranial nerves II-XII intact, strength normal in all extremities, gait normal, DTRs symmetrical and normal, and finger-to-nose normal.   Cervical Nodes:     No supraclavicular LAD however fullness that feels c/w fatty tissue noted bilaterally.  Psych:     depressed, childlike affect.    Impression & Recommendations:  Problem # 1:  SYMPTOM, HEADACHE (ICD-784.0) Unclear etiology.  Concerned about temporal arteritis given hx however, should not have worsening with prednisone.  I'm not completely sure if she is taking it or not but she has gained 15 lbs which makes me think she has.  Will repeat ESR today to see if it has gone down.  Will also plan to call her pharmacy to see if she has had it filled.  Will decrease prednisone to 20 mg, esp since it is not working.   Also, could be migraines given severity and unilateral location and will start tx w/ immitrex.  Will set her up w/ Marcelle Smiling and will give her small prescription for darvocet since this helps.  Would not give any stronger narcotics.  Could also be related to TMJ inflammation given jaw pain hx and popping noise.  Will f/u ESR and see in one month.  Would not do any imaging at this pt since neuro exam is normal.   Her updated medication list for this problem includes:    Coreg 6.25 Mg Tabs (Carvedilol) .Marland Kitchen... Take 1 tablet by mouth two times a day    Darvocet A500 100-500 Mg Tabs (Propoxyphene n-apap) .Marland Kitchen... Take one tab by mouth once every 6 hours as needed for headache    Imitrex 50 Mg Tabs (Sumatriptan succinate) .Marland Kitchen... Take one pill for headache.  if no relief after one hour, take one more pill.  do not take more than three pills a day.  Orders: T-Sed Rate (Automated) [phone])   Problem # 2:  DENTAL PAIN (ICD-525.9) Very poor dentition.  ? abscess.  Will check  orthopantogram.  Orders: Diagnostic X-Ray/Fluoroscopy (Diagnostic X-Ray/Flu)   Problem # 3:  ? of HEPATIC CYST (ICD-573.8) CT scan seen earllier this year noted hypodense lesions in the  liver but recommended repeat imaging to look for stability.  Spoke w/ pt about this and plan for u/s of the liver.  Orders: Ultrasound (Ultrasound)   Problem # 4:  HYPOKALEMIA (ICD-276.8) Will repeat BMET today since hypokalemic previously.  Orders: T-Basic Metabolic Panel [id number])   Problem # 5:  HYPERTENSION (ICD-401.9) Will not adjust since she has been out of her meds for two days.  Her updated medication list for this problem includes:    Coreg 6.25 Mg Tabs (Carvedilol) .Marland Kitchen... Take 1 tablet by mouth two times a day    Benazepril Hcl 40 Mg Tabs (Benazepril hcl) .Marland KitchenMarland KitchenMarland KitchenMarland Kitchen  Take 1 tablet by mouth once a day for blood pressure. take this after finishing the lisinopril    Maxzide-25 37.5-25 Mg Tabs (Triamterene-hctz) .Marland Kitchen... Take one tablet by mouth daily.   Problem # 6:  ANXIETY DISORDER, GENERALIZED (ICD-300.02) Not taking paxil b/c she said she didn't know about it.  Will increase klonopin dose b/c still have panic attacks and add SSRI and titrate up.  Doesn't not want to see a psychiatrist or councelor.  The following medications were removed from the medication list:    Paxil 40 Mg Tabs (Paroxetine hcl) .Marland Kitchen... Take 1 tablet by mouth every morning  Her updated medication list for this problem includes:    Klonopin 1 Mg Tabs (Clonazepam) .Marland Kitchen... Take 1 tablet by mouth two times a day    Prozac 10 Mg Caps (Fluoxetine hcl) .Marland Kitchen... Take 1 tablet by mouth every morning   Problem # 7:  Preventive Health Care (ICD-V70.0) Assessment: Improved No-showed to all of her appts w/ GI.  Will remind pt she is due for mammogram at next visit.  Pap 07/09 WNL.  Problem # 8:  HYPERLIPIDEMIA (ICD-272.4) Due for lipids but not fasting.  Will get at next office visit.  Meds ion list for this problem includes:     Pravachol 10 Mg Tabs (Pravastatin sodium) .Marland Kitchen... Take 1 tablet by mouth once a day before bed.  Labs Reviewed: Chol: 220 (10/19/2007)   HDL: 40 (10/19/2007)   LDL: 113 (10/19/2007)   TG: 334 (10/19/2007) SGOT: 24 (08/25/2008)   SGPT: 12 (08/25/2008)   Complete Medication List: 1)  Coreg 6.25 Mg Tabs (Carvedilol) .... Take 1 tablet by mouth two times a day 2)  Benazepril Hcl 40 Mg Tabs (Benazepril hcl) .... Take 1 tablet by mouth once a day for blood pressure. take this after finishing the lisinopril 3)  Antivert 12.5 Mg Tabs (Meclizine hcl) .Marland Kitchen.. 1-2 tablets every 4 hours as needed for dizziness 4)  Klonopin 1 Mg Tabs (Clonazepam) .... Take 1 tablet by mouth two times a day 5)  Maxzide-25 37.5-25 Mg Tabs (Triamterene-hctz) .... Take one tablet by mouth daily. 6)  Pravachol 10 Mg Tabs (Pravastatin sodium) .... Take 1 tablet by mouth once a day before bed. 7)  Darvocet A500 100-500 Mg Tabs (Propoxyphene n-apap) .... Take one tab by mouth once every 6 hours as needed for headache 8)  Nitrostat 0.4 Mg Subl (Nitroglycerin) .... Take one tab under the tongue as needed for severe chest pain.  do not use more than one a day. 9)  Prednisone 20 Mg Tabs (Prednisone) .... Take 1 tablet daily by mouth. 10)  Imitrex 50 Mg Tabs (Sumatriptan succinate) .... Take one pill for headache.  if no relief after one hour, take one more pill.  do not take more than three pills a day. 11)  Prozac 10 Mg Caps (Fluoxetine hcl) .... Take 1 tablet by mouth every morning   Patient Instructions: 1)  Please make a followup appointment in one month. 2)  Please decrease prednisone to 20 mg once a day.  Do not abruptly stop this medication.  3)  Please see Marcelle Smiling to see if she can help you get your medications. 4)  Please get your bloodwork done today. 5)  Please increase klonopin to 1 mg twice a day. 6)  Please start taking prozac in the morning. 7)  Please get your xray and ultrasound done.  8)  Please try your new  migraine medication.   Prescriptions: DARVOCET A500 100-500 MG  TABS (PROPOXYPHENE N-APAP) take one tab by mouth once every 6 hours as needed for headache  #60 x 0   Entered and Authorized by:   Joaquin Courts  MD   Signed by:   Joaquin Courts  MD on 12/25/2008   Method used:   Print then Give to Patient   RxID:   [id number] PREDNISONE 20 MG  TABS (PREDNISONE) take 1 tablet daily by mouth.  #30 x 1   Entered and Authorized by:   Joaquin Courts  MD   Signed by:   Joaquin Courts  MD on 12/25/2008   Method used:   Print then Give to Patient   RxID:   [id number] IMITREX 50 MG TABS (SUMATRIPTAN SUCCINATE) Take one pill for headache.  If no relief after one hour, take one more pill.  Do not take more than three pills a day.  #30 x 0   Entered and Authorized by:   Joaquin Courts  MD   Signed by:   Joaquin Courts  MD on 12/25/2008   Method used:   Print then Give to Patient   RxID:   [id number] PROZAC 10 MG CAPS (FLUOXETINE HCL) Take 1 tablet by mouth every morning  #30 x 4   Entered and Authorized by:   Joaquin Courts  MD   Signed by:   Joaquin Courts  MD on 12/25/2008   Method used:   Print then Give to Patient   RxID:   [id number] KLONOPIN 1 MG TABS (CLONAZEPAM) Take 1 tablet by mouth two times a day  #60 x 3   Entered and Authorized by:   Joaquin Courts  MD   Signed by:   Joaquin Courts  MD on 12/25/2008   Method used:   Print then Give to Patient   RxID:   [id number] IMITREX 50 MG TABS (SUMATRIPTAN SUCCINATE) Take one pill for headache.  If no relief after one hour, take one more pill.  Do not take more than three pills a day.  #30 x 0   Entered and Authorized by:   Joaquin Courts  MD   Signed by:   Joaquin Courts  MD on 12/25/2008   Method used:   Electronically to        [facility]* (retail)       [address]       [place], [place]  [address]       Ph: [phone]       Fax: [phone]   RxID:   [id number] DARVOCET A500  100-500 MG  TABS (PROPOXYPHENE N-APAP) take one tab by mouth once every 6 hours as needed for headache  #60 x 3   Entered and Authorized by:   Joaquin Courts  MD   Signed by:   Joaquin Courts  MD on 12/25/2008   Method used:   Electronically to        [facility]* (retail)       [address]       [place], [place]  [address]       Ph: [phone]       Fax: [phone]   RxID:   [id number] PREDNISONE 20 MG  TABS (PREDNISONE) take 1 tablet daily by mouth.  #30 x 1   Entered and Authorized by:   Joaquin Courts  MD   Signed by:   Joaquin Courts  MD on 12/25/2008   Method used:   Electronically to        [facility]* (retail)       [address]       [place], [place]  [address]       Ph: [phone]       Fax: [phone]   RxID:   [id number]  ]  Appended Document: h/a's, c/o on call md/pcp-Ayaana Biondo/hla Notified by health dept that they only have 20 mg caps of prozac and only 100mg /650 of darvocet so will make appropriate changes in dosages and have nurse call in.   Clinical Lists Changes  Medications: Changed medication from PROZAC 10 MG CAPS (FLUOXETINE HCL) Take 1 tablet by mouth every morning to PROZAC 20 MG CAPS (FLUOXETINE HCL) Take 1 tablet by mouth every morning - Signed Changed medication from DARVOCET A500 100-500 MG  TABS (PROPOXYPHENE N-APAP) take one tab by mouth once every 6 hours as needed for headache to DARVOCET-N 100 100-650 MG TABS (PROPOXYPHENE N-APAP) Take one tablet by mouth every six hours as needed for pain. - Signed Rx of PROZAC 20 MG CAPS (FLUOXETINE HCL) Take 1 tablet by mouth every morning;  #30 x 3;  Signed;  Entered by: Joaquin Courts  MD;  Authorized by: Joaquin Courts  MD;  Method used: Printed then faxed to [address], , [place], [place]  , Ph: [phone], Fax:  Rx of DARVOCET-N 100 100-650 MG TABS (PROPOXYPHENE N-APAP) Take one tablet by mouth every six hours as needed for pain.;  #60 x 0;  Signed;   Entered by: Joaquin Courts  MD;  Authorized by: Joaquin Courts  MD;  Method used: Printed then faxed to [address], , [place], [place]  , Ph: [phone], Fax:    Prescriptions: DARVOCET-N 100 100-650 MG TABS (PROPOXYPHENE N-APAP) Take one tablet by mouth every six hours as needed for pain.  #60 x 0   Entered and Authorized by:   Joaquin Courts  MD   Signed by:   Joaquin Courts  MD on 12/26/2008   Method used:   Printed then faxed to ...       [facility] [address] (retail)             [place], [place]         Ph: [phone]       Fax:    RxID:   [id number] PROZAC 20 MG CAPS (FLUOXETINE HCL) Take 1 tablet by mouth every morning  #30 x 3   Entered and Authorized by:   Joaquin Courts  MD   Signed by:   Joaquin Courts  MD on 12/26/2008   Method used:   Printed then faxed to ...       [facility] [address] (retail)             [place], [place]         Ph: [phone]       Fax:    RxID:   [id number]

## 2011-01-23 NOTE — Progress Notes (Signed)
Summary: refill/ hla  Phone Note Refill Request Message from:  Patient on June 07, 2008 12:23 PM  Refills Requested: Medication #1:  COREG 6.25 MG TABS Take 1 tablet by mouth two times a day  Medication #2:  BENAZEPRIL HCL 40 MG TABS Take 1 tablet by mouth once a day for blood pressure. Take this after finishing the Lisinopril  Medication #3:  ANTIVERT 12.5 MG TABS 1-2 tablets every 4 hours as needed for dizziness  Medication #4:  VALIUM 2 MG TABS Take 1 tablet by mouth two times a day as needed anxiety Initial call taken by: Marin Roberts RN,  June 07, 2008 12:24 PM  Follow-up for Phone Call        Refill approved-nurse to complete for valium. Follow-up by: Joaquin Courts  MD,  June 08, 2008 10:52 AM  Additional Follow-up for Phone Call Additional follow up Details #1::        I will be happy to call the valium in but the Rx needs to be written.   Pt is out of valium. Additional Follow-up by: Merrie Roof RN,  June 08, 2008 12:24 PM    Additional Follow-up for Phone Call Additional follow up Details #2::    valium called in Follow-up by: Merrie Roof RN,  June 08, 2008 3:55 PM    Prescriptions: VALIUM 2 MG TABS (DIAZEPAM) Take 1 tablet by mouth two times a day as needed anxiety  #60 x 3   Entered and Authorized by:   Joaquin Courts  MD   Signed by:   Joaquin Courts  MD on 06/08/2008   Method used:   Electronically sent to ...       [address]       [address]       [place], [place]  [address]       Ph: [phone]       Fax: [phone]   RxID:   [id number] COREG 6.25 MG TABS (CARVEDILOL) Take 1 tablet by mouth two times a day  #60 x 3   Entered and Authorized by:   Joaquin Courts  MD   Signed by:   Joaquin Courts  MD on 06/08/2008   Method used:   Electronically sent to ...       [address]*       [address]       [place], [place]  [address]       Ph: [phone]       Fax: [phone]   RxID:   [id number] BENAZEPRIL HCL 40 MG  TABS (BENAZEPRIL HCL) Take 1 tablet by mouth once a day for blood pressure. Take this after finishing the Lisinopril  #30 x 3   Entered and Authorized by:   Joaquin Courts  MD   Signed by:   Joaquin Courts  MD on 06/08/2008   Method used:   Electronically sent to ...       [address]       [address]       [place], [place]  [address]       Ph: [phone]       Fax: [phone]   RxID:   [id number] ANTIVERT 12.5 MG TABS (MECLIZINE HCL) 1-2 tablets every 4 hours as needed for dizziness  #90 x 0   Entered and Authorized by:   Joaquin Courts  MD   Signed by:   Joaquin Courts  MD on 06/08/2008   Method used:   Electronically  sent to ...       [address]       [address] [place] [address]       [place], [place]  [address]       Ph: [phone]       Fax: [phone]   RxID:   [id number]

## 2011-01-23 NOTE — Progress Notes (Signed)
  Phone Note Call from Patient   Caller: Patient Summary of Call: Pt called c/o diarrhea x 3 days ( about 3-4 's a day) with some nausea.  Also  with boyfriend 3 nights ago and now burning in vaginal area.  dysuria.  Denies dizziness. will see tomorrow. Initial call taken by: Merrie Roof RN,  July 06, 2007 10:58 AM

## 2011-01-23 NOTE — Progress Notes (Signed)
Summary: med issue/ hla  Phone Note Call from Patient   Action Taken: Phone Call Completed Summary of Call: pt calls wanting to know when dr Andrey Campanile was leaving and why she hasn't left yet, i told her dr Andrey Campanile would finish her 3 yrs in june, she then said she's leaving isn't she, i told her i had no idea what  about dr Janiylah Hannis's plans. she then ask why dr Andrey Campanile wouldn't give her something for her nerves, that she couldn't help if it wasn't in her urine. i told her i could not answer for dr Andrey Campanile and that she would need to schedule an appt to discuss this issue, as i scheduled the appt she ask for dr Andrey Campanile to call her at [phone] Initial call taken by: Marin Roberts RN,  March 21, 2010 4:50 PM  Follow-up for Phone Call        We have discussed this issue at length over the phone on multiple occasions and clearly we do not get anywhere.  I will be happy to discuss this issue in person at her next office visit.  Follow-up by: Joaquin Courts  MD,  March 21, 2010 5:28 PM  Additional Follow-up for Phone Call Additional follow up Details #1::        Pt informed. Additional Follow-up by: Merrie Roof RN,  March 25, 2010 3:08 PM

## 2011-01-23 NOTE — Progress Notes (Signed)
Summary: appt 4/8/hla  Phone Note Call from Patient   Caller: Patient Reason for Call: Acute Illness Summary of Call: pt called concerned about std's and could possibly have contracted an std from her partner, appt given for eval 4/8 Initial call taken by: Marin Roberts RN,  March 28, 2008 10:30 AM

## 2011-01-23 NOTE — Progress Notes (Signed)
Summary: phone call  Phone Note Call from Patient   Caller: Patient Summary of Call: Pt called c/o headache and nausea onand off x 1 week.  no other c/o.  Tylenol is not helping. Dr Sherlon Handing informed and will call in meds for nausea.  Pt states she has been taking benazepril and other meds. Initial call taken by: Merrie Roof RN,  May 20, 2007 9:55 AM  Follow-up for Phone Call        Rx Called In Likely all HTN related.  Will re-eval at f/u appt.  Follow-up by: Coralie Carpen MD,  May 20, 2007 5:51 PM  New/Updated Medications: PROMETHAZINE HCL 25 MG TABS (PROMETHAZINE HCL) Take one tablet every 12 hours as needed for nausea  New/Updated Medications: PROMETHAZINE HCL 25 MG TABS (PROMETHAZINE HCL) Take one tablet every 12 hours as needed for nausea  Prescriptions: PROMETHAZINE HCL 25 MG TABS (PROMETHAZINE HCL) Take one tablet every 12 hours as needed for nausea  #20 x 0   Entered and Authorized by:   Coralie Carpen MD   Signed by:   Coralie Carpen MD on 05/20/2007   Method used:   Telephoned to ...       [facility] [address]       [address]       [place], [place]  [address]       Ph:        Fax:    RxID:   [id number]

## 2011-01-23 NOTE — Progress Notes (Signed)
Summary: Insomnia x 2 weeks  Phone Note Call from Patient Call back at Home Phone [phone]   Caller: Patient Summary of Call: Having problems sleeping for 2 weeks.  Having trouble falling asleep. Pt given an appt on Friday at 10:30 am with Dr. Ardyth Harps. Initial call taken by: Henderson Cloud,  June 09, 2007 4:20 PM

## 2011-01-23 NOTE — Progress Notes (Signed)
  Phone Note Call from Patient   Caller: Patient Summary of Call: She states that she did not receive her prescription for metronidazole.  I phoned it in to the [employer]. Initial call taken by: Valetta Close MD,  July 20, 2008 5:12 PM      Prescriptions: METRONIDAZOLE 500 MG  TABS (METRONIDAZOLE) 1 tablet twice a day for 7 days. take it with food.  #14 x 0   Entered and Authorized by:   Valetta Close MD   Signed by:   Valetta Close MD on 07/20/2008   Method used:   Telephoned to ...       [address]       [address]       [place], [place]  [address]       Ph: [phone]       Fax: [phone]   RxID:   [id number]

## 2011-01-23 NOTE — Progress Notes (Signed)
Summary: refill/gg  Phone Note Refill Request  on March 27, 2008 3:16 PM  Refills Requested: Medication #1:  DARVOCET A500 100-500 MG  TABS take one tab by mouth once every 6 hours as needed for headache Initial call taken by: Merrie Roof RN,  March 27, 2008 3:16 PM  Follow-up for Phone Call        Refill approved-nurse to complete Follow-up by: Joaquin Courts  MD,  March 27, 2008 6:08 PM  Additional Follow-up for Phone Call Additional follow up Details #1::        Please tell her that she needs to make an appointment to discuss her pains.  When I saw her last time she did not mention headache or other pains that would require pain medications.    Additional Follow-up for Phone Call Additional follow up Details #2::    I already refilled 30 tablets before the note for Dr. Andrey Campanile popped up. Follow-up by: Carlus Pavlov MD,  March 27, 2008 6:13 PM    Prescriptions: DARVOCET A500 100-500 MG  TABS (PROPOXYPHENE N-APAP) take one tab by mouth once every 6 hours as needed for headache  #15 x 0   Entered by:   Joaquin Courts  MD   Authorized by:   Merrie Roof RN   Signed by:   Joaquin Courts  MD on 03/27/2008   Method used:   Telephoned to ...       [address]       [address]       [place], [place]  [address]       Ph: [phone]       Fax: [phone]   RxID:   [id number]

## 2011-01-23 NOTE — Progress Notes (Signed)
Summary: Call   Phone Note Call from Patient   Caller: Patient Call For: Joaquin Courts  MD Summary of Call: Call from pt. Angelina Ok RN  January 17, 2010 2:46 PM  Initial call taken by: Angelina Ok RN,  January 17, 2010 2:46 PM  Follow-up for Phone Call        Please see below notes.  Follow-up by: Joaquin Courts  MD,  January 17, 2010 2:57 PM

## 2011-01-23 NOTE — Progress Notes (Signed)
  Phone Note Call from Patient   Summary of Call: Pt with HA, tells she will not take the Prednisone Rx for poss. temporal arteritis. She would want Darvocet. Will call 15 tablets in. Called pharmacy and they only have N100 (same doses). Will refill this. Initial call taken by: Carlus Pavlov MD,  September 23, 2008 12:40 PM      Prescriptions: DARVOCET A500 100-500 MG  TABS (PROPOXYPHENE N-APAP) take one tab by mouth once every 6 hours as needed for headache  #15 x 0   Entered and Authorized by:   Carlus Pavlov MD   Signed by:   Carlus Pavlov MD on 09/23/2008   Method used:   Telephoned to ...       [address] (retail)       [address]       [place], [place]  [address]       Ph: [phone]       Fax: [phone]   RxID:   [id number]

## 2011-01-23 NOTE — Miscellaneous (Signed)
Summary: Rehab Report: Chlamydia and GC Probe Amp  Rehab Report: Chlamydia and GC Probe Amp   Imported By: Florinda Marker 07/14/2007 13:59:30  _____________________________________________________________________  External Attachment:    Type:   Image     Comment:   External Document

## 2011-01-23 NOTE — Assessment & Plan Note (Signed)
Summary: F/U/EST/VS   Vital Signs:  Patient profile:   55 year old female Height:      63 inches (160.02 cm) Weight:      193.3 pounds (90.14 kg) BMI:     35.25 Temp:     97.0 degrees F oral Pulse rate:   70 / minute BP sitting:   135 / 84  (left arm)  Vitals Entered By: Chinita Pester RN (December 04, 2009 11:03 AM) CC: F/u visit.  Continues to c/o headaches and had MRI today. Is Patient Diabetic? No Pain Assessment Patient in pain? yes     Location: headache Intensity: 8/9 Type: sharp Onset of pain  Chronic Nutritional Status BMI of > 30 = obese  Have you ever been in a relationship where you felt threatened, hurt or afraid?No   Does patient need assistance? Functional Status Self care Ambulation Normal   Primary Care Provider:  Joaquin Courts  MD  CC:  F/u visit.  Continues to c/o headaches and had MRI today.Catherine [name]  History of Present Illness: Pt is a 55 yo female w/ past med hx below here for f/u of MRI.  She notes continued L sided HA's that is essentially present about every day and last for a couple of hours and it goes away on its own.  She has tried tylenol without relief.  SHe has not tried the ultram I prescribed at her last visit.  She also has intermittent dizziness and chronic fatigue and blurry vision associated with her HA's.  She notes all of this stuff has gotton worse since she lost her job and apartment.  She has not seen the psychiatrist b/c she does not want to feel like she is "crazy."  She continues to endorse depressed feelings everyday, guilt, and difficulty sleeping. She states she has been taking the clonopin every single day along with the prozac.  Preventive Screening-Counseling & Management  Alcohol-Tobacco     Alcohol drinks/day: 0     Smoking Status: never  Caffeine-Diet-Exercise     Does Patient Exercise: no  Current Medications (verified): 1)  Coreg 6.25 Mg Tabs (Carvedilol) .... Take 1 Tablet By Mouth Two Times A Day 2)   Benazepril Hcl 40 Mg Tabs (Benazepril Hcl) .... Take 1 Tablet By Mouth Once A Day For Blood Pressure. Take This After Finishing The Lisinopril 3)  Antivert 12.5 Mg Tabs (Meclizine Hcl) .Catherine [name].. 1-2 Tablets Every 4 Hours As Needed For Dizziness 4)  Klonopin 1 Mg Tabs (Clonazepam) .... Take 1 Tablet By Mouth Two Times A Day 5)  Maxzide-25 37.5-25 Mg  Tabs (Triamterene-Hctz) .... Take One Tablet By Mouth Daily. 6)  Pravachol 10 Mg  Tabs (Pravastatin Sodium) .... Take 1 Tablet By Mouth Once A Day Before Bed. 7)  Ultram 50 Mg Tabs (Tramadol Hcl) .... One Tab By Mouth Every 8 Hours As Needed For Pain. 8)  Nitrostat 0.4 Mg  Subl (Nitroglycerin) .... Take One Tab Under The Tongue As Needed For Severe Chest Pain.  Do Not Use More Than One A Day. 9)  Prozac 40 Mg Caps (Fluoxetine Hcl) .... Take 1 Tablet By Mouth Once A Day 10)  Norvasc 5 Mg Tabs (Amlodipine Besylate) .... Take 1 Tablet By Mouth Once A Day 11)  Omeprazole 20 Mg Cpdr (Omeprazole) .... Take 1 Tablet By Mouth Once A Day  Allergies (verified): 1)  ! Cipro  Past History:  Past Surgical History: Last updated: 05/11/2007 Tubal ligation  Social History: Last updated: 09/21/2008 Never smoked,  no alcohol, divorced.  Recently laid off from her job.  Risk Factors: Smoking Status: never (12/04/2009)  Past Medical History: CAD: S/p cardiac catherization by Dr.Ganji(3/08)          Mild LAD stenosis (60%)          Ef 50-55%          Medical mangement        w/ repeat cath in 12/08 by Dr. Lynnea Ferrier: essentially normal coronary          arteries with a possible 20% mid left anterior descending lesion versus         a myocardial bridge. A f/u myoview as an outpt was to be done which she did have done.   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 Mother, son, and brother with htn.   No family hx of MI, DM, or breast cancer.    Social History: Reviewed history from 09/21/2008 and no changes required. Never smoked, no alcohol, divorced.  Recently laid off from her job.  Review of Systems       as per HPI.  Physical Exam  General:  alert, oriented, depressed appearing.  Eyes:  anicteric. Mouth:  MMM, upper teeth missing, bottom teeth with poor dentition. Neck:  no LAD. Lungs:  Normal respiratory effort, chest expands symmetrically. Lungs are clear to auscultation, no crackles or wheezes. Heart:  Normal rate and regular rhythm. S1 and S2 normal without gallop, murmur, click, rub or other extra sounds. Abdomen:  obese, soft NT and ND. Extremities:  trace lower extremity edema.  Neurologic:  CN's II-XII intact, gait normal, finger to nose normal, DTR's symmetric, sensation intact. Cervical Nodes:  No lymphadenopathy noted Psych:  poor eye contact, depressed appearing, denies SI.   Impression & Recommendations:  Problem # 1:  SYMPTOM, HEADACHE (ICD-784.0) Unclear the etiology.  ? related to TMJ vs chronic tooth abscess vs migraine vs tension HA.  She was tx'd empirically for giant cell arteritis b/c of visual changes and did not make any improvements in her symptoms, nor did she go for TA bx, and her ESR didn't improve so we stopped that.  Prior MRI showed chronic white matter changes w/ a broad diff'l, however, repeat MRI done today does not appear to have changed much, so doubt vasculitis or MS.  I tx'd her w/ clinda a while back b/c of the orthopantogram showing periapical lucency in her bottom teeth on her L side and that didn't seem to help.  I am hesitant to write for ergots/triptans for migraines b/c of the ? of CADz in the past and htn.  Since darvocet worked for her in the  past, I have requested she fill ultram to see if that helps.  Also, I think psychiatry referral for councelling may also help these symptoms b/c she notes that everything got worse 1+ years ago when she lost her job and appt.  Her updated medication list for this problem includes:    Coreg 6.25 Mg Tabs (Carvedilol) .Catherine [name]... Take 1 tablet by mouth two times a day    Ultram 50 Mg Tabs (Tramadol hcl) ..... One tab by mouth every 8 hours as needed for pain.  Problem #  2:  SEDIMENTATION RATE, ELEVATED (ICD-790.1) ? etiology.  Tx'd dental infection and it did not respond.  Tx'd her w/ steroids b/c of ? of TA and it did not decrease much.  Also, had BRBPR w/ heme + stools but no-showed to her GI appts, but pathology surrounding this could be contributing.  At this point, given her anemia w/ panel c/w AOCDz, two weakly + ANA's, multiple systemic complaints, and ? of vasculitis on MRI, I think it is reasonable to send her to a rheumatologist for further workup and she states, that despite missing all other appts, she would be willing to go to this.   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  She is also on klonopin for anxiety and has been on a benzo for a long time, but interestingly, her UDS at last check came up neg.  I asked her about this today and she notes taking all of her meds everyday, even the klonopin.  I will repeat her UDS today and if neg, will consider stopping it b/c it would appear that she is not taking it.  I have insisted that she see a psychiatrist b/c she has been on multiple SSRI's and states none of them help and also encouraged therapy.  Social worker has seen her today to also offer assistance.   Problem # 4:  ANXIETY DISORDER, GENERALIZED (ICD-300.02) Please see above discussion about klonopin in depression.  Her updated medication list for this problem includes:    Klonopin 1 Mg Tabs (Clonazepam) .Catherine [name]... Take 1 tablet by mouth two  times a day    Prozac 40 Mg Caps (Fluoxetine hcl) .Catherine [name]... Take 1 tablet by mouth once a day  Problem # 5:  HYPERTENSION (ICD-401.9) BP fine.  BMET today.   Her updated medication list for this problem includes:    Coreg 6.25 Mg Tabs (Carvedilol) .Catherine [name]... Take 1 tablet by mouth two times a day    Benazepril Hcl 40 Mg Tabs (Benazepril hcl) .Catherine [name]... Take 1 tablet by mouth once a day for blood pressure. take this after finishing the lisinopril    Maxzide-25 37.5-25 Mg Tabs (Triamterene-hctz) .Catherine [name]... Take one tablet by mouth daily.    Norvasc 5 Mg Tabs (Amlodipine besylate) .Catherine [name]... Take 1 tablet by mouth once a day  Complete Medication List: 1)  Coreg 6.25 Mg Tabs (Carvedilol) .... Take 1 tablet by mouth two times a day 2)  Benazepril Hcl 40 Mg Tabs (Benazepril hcl) .... Take 1 tablet by mouth once a day for blood pressure. take this after finishing the lisinopril 3)  Antivert 12.5 Mg Tabs (Meclizine hcl) .Catherine [name].. 1-2 tablets every 4 hours as needed for dizziness 4)  Klonopin 1 Mg Tabs (Clonazepam) .... Take 1 tablet by mouth two times a day 5)  Maxzide-25 37.5-25 Mg Tabs (Triamterene-hctz) .... Take one tablet by mouth daily. 6)  Pravachol 10 Mg Tabs (Pravastatin sodium) .... Take 1 tablet by mouth once a day before bed. 7)  Ultram 50 Mg Tabs (Tramadol hcl) .... One tab by mouth every 8 hours as needed for pain. 8)  Nitrostat 0.4 Mg Subl (Nitroglycerin) .... Take one tab under the tongue as needed for severe chest pain.  do not use more than one a day. 9)  Prozac 40 Mg Caps (Fluoxetine hcl) .... Take 1 tablet by mouth once a day 10)  Norvasc 5 Mg Tabs (Amlodipine besylate) .... Take 1 tablet by mouth once a day  11)  Omeprazole 20 Mg Cpdr (Omeprazole) .... Take 1 tablet by mouth once a day  Other Orders: T-Drug Screen-Urine, (single) [phone]) T-Comprehensive Metabolic Panel [id number]) T-Sed Rate (Automated) [id number]) Rheumatology Referral (Rheumatology)  Patient Instructions: 1)  Please make a  followup appointment in 1 month for a checkup. 2)  Please see the rhematologist. 3)  Please see the psychiatrist.   Prevention & Chronic Care Immunizations   Influenza vaccine: Not documented   Influenza vaccine deferral: Refused  (11/07/2009)    Tetanus booster: Not documented    Pneumococcal vaccine: Not documented  Colorectal Screening   Hemoccult: Not documented    Colonoscopy: Not documented  Other Screening   Pap smear:  Specimen Adequacy: Satisfactory for evaluation.   Interpretation/Result:Negative for intraepithelial Lesion or Malignancy.     (07/20/2008)   Pap smear action/deferral: Ordered  (11/07/2009)   Pap smear due: 07/2008    Mammogram: ASSESSMENT: Negative - BI-RADS 1^MM DIGITAL SCREENING  (04/23/2009)   Smoking status: never  (12/04/2009)  Lipids   Total Cholesterol: 206  (03/28/2009)   LDL: 90  (03/28/2009)   LDL Direct: Not documented   HDL: 75  (03/28/2009)   Triglycerides: 206  (03/28/2009)    SGOT (AST): 18  (03/28/2009)   SGPT (ALT): 13  (03/28/2009) CMP ordered    Alkaline phosphatase: 59  (03/28/2009)   Total bilirubin: 0.3  (03/28/2009)    Lipid flowsheet reviewed?: Yes   Progress toward LDL goal: At goal  Hypertension   Last Blood Pressure: 135 / 84  (12/04/2009)   Serum creatinine: 1.35  (11/07/2009)   Serum potassium 3.7  (11/07/2009) CMP ordered     Hypertension flowsheet reviewed?: Yes   Progress toward BP goal: At goal  Self-Management Support :    Patient will work on the following items until the next clinic visit to reach self-care goals:     Medications and monitoring: take my medicines every day, check my blood pressure  (12/04/2009)    Hypertension self-management support: Written self-care plan  (12/04/2009)   Hypertension self-care plan printed.    Lipid self-management support: Not documented   Process Orders Check Orders Results:     [facility]: ABN not required for this insurance Tests  Sent for requisitioning (December 06, 2009 9:51 AM):     12/04/2009: [facility] -- T-Drug Screen-Urine, (single) [[id number]] (signed)     12/04/2009: [facility] -- T-Comprehensive Metabolic Panel [[id number]] (signed)     12/04/2009: [facility] -- T-Sed Rate (Automated) [phone] (signed)

## 2011-01-23 NOTE — Miscellaneous (Signed)
  Clinical Lists Changes  Orders: Added new Referral order of Surgical Referral (Surgery) - Signed 

## 2011-01-23 NOTE — Progress Notes (Signed)
Summary: refill/gg  Phone Note Refill Request  on April 15, 2007 11:55 AM  Refills Requested: Medication #1:  VALIUM 2 MG TABS Take 1 tablet by mouth two times a day as needed anxiety   Dosage confirmed as above?Dosage Confirmed   Last Refilled: 03/22/2007 Initial call taken by: Merrie Roof RN,  April 15, 2007 11:55 AM  Follow-up for Phone Call        Rx denied because it is not clear from the EMR how many per month her PCP normally gives her since it is written as needed.  She received #20 on 3/31 by Dr. Aundria Rud with no comment regarding usual quantity.  Please get more info.  Thank you Follow-up by: Duncan Dull MD,  April 15, 2007 12:05 PM  Additional Follow-up for Phone Call Additional follow up Details #1::        Pt seen on 7 Dec 07 by Dr.Joines in regards to chronic valium for GAD. After significant d/w Dr. Meredith Pel a mutual plan was arranged to continue Valium at lower dose 2mg  two times a day with her Effexor 75mg  daily. She adamantly refused psychiatry eval. She has remained on this treatment plan since that time, especially during her hosp in Mar08. She did not keep her f/u appt with me last week. Refilled via Dr. Tiajuana Amass. I will refill this medication for now for one month only. In that time, she is to make a f/u appt. In the end, this patient needs psychiatry assistance. Additional Follow-up by: Coralie Carpen MD,  April 15, 2007 2:16 PM    Prescriptions: VALIUM 2 MG TABS (DIAZEPAM) Take 1 tablet by mouth two times a day as needed anxiety  #60 x 0   Entered by:   Coralie Carpen MD   Authorized by:   Merrie Roof RN   Signed by:   Coralie Carpen MD on 04/15/2007   Method used:   Telephoned to ...       [facility]       [address] [place], [place]  [address]  [place]       Ph: [phone]       Fax: [phone]   RxID:   [id number]   Appended Document: refill/gg Pt called and does not use health dept for meds. she does  not qualify.  I called health dept and they never received the Rx.  so I called the Rx for valium into [facility] on [address].  Pt phone number is not working so I am not able to tell her Gilberto Better

## 2011-01-23 NOTE — Progress Notes (Signed)
Summary: refill change/gh  Phone Note Refill Request Message from:  Fax from Pharmacy on January 22, 2007 9:51 AM  Refills Requested: Medication #1:  Valium 2 mg  BID for anxiety Pharmacy wants to give 1/2  of a 5mg  tablet.  Initial call taken by: Angelina Ok RN,  January 22, 2007 9:52 AM  Follow-up for Phone Call        Change Valium prescription to Valium 5 mg, take 1/2 tablet by mouth two times a day as needed for anxiety, #15, no additional refills.  Patient should keep her appt next week to see how she is doing on that dose. Follow-up by: Margarito Liner MD,  January 22, 2007 11:17 AM  Additional Follow-up for Phone Call Additional follow up Details #1::        Rx called to pharmacy Additional Follow-up by: Angelina Ok RN,  January 22, 2007 11:58 AM

## 2011-01-23 NOTE — Progress Notes (Signed)
Summary: paperwork/ hla  Phone Note Call from Patient   Caller: Patient Call For: social worker Summary of Call: pt would like for you to call her at [phone], needs some paperwork  thank you Initial call taken by: Marin Roberts RN,  November 22, 2009 10:53 AM  Follow-up for Phone Call        Met with patient.  See SW note.

## 2011-01-23 NOTE — Progress Notes (Signed)
Summary: refill/gg  Phone Note Refill Request  on November 23, 2007 5:02 PM  Refills Requested: Medication #1:  COREG 6.25 MG TABS Take 1 tablet by mouth two times a day  Medication #2:  BENAZEPRIL HCL 40 MG TABS Take 1 tablet by mouth once a day for blood pressure. Take this after finishing the Lisinopril  Medication #3:  ANTIVERT 12.5 MG TABS 1-2 tablets every 4 hours as needed for dizziness  Medication #4:  VALIUM 2 MG TABS Take 1 tablet by mouth two times a day as needed anxiety Health dept  Initial call taken by: Merrie Roof RN,  November 23, 2007 5:02 PM  Additional Follow-up for Phone Call Additional follow up Details #1::        Rx called to pharmacy Additional Follow-up by: Merrie Roof RN,  November 25, 2007 10:57 AM      Prescriptions: VALIUM 2 MG TABS (DIAZEPAM) Take 1 tablet by mouth two times a day as needed anxiety  #180 x 1   Entered and Authorized by:   Ulyess Mort MD   Signed by:   Ulyess Mort MD on 11/23/2007   Method used:   Telephoned to ...       [facility]       [address] [place], [place]  [address]  [place]       Ph: [phone]       Fax: [phone]   RxID:   [id number] ANTIVERT 12.5 MG TABS (MECLIZINE HCL) 1-2 tablets every 4 hours as needed for dizziness  #90 x 0   Entered and Authorized by:   Ulyess Mort MD   Signed by:   Ulyess Mort MD on 11/23/2007   Method used:   Telephoned to ...       [facility]       [address] [place], [place]  [address]  [place]       Ph: [phone]       Fax: [phone]   RxID:   [id number] COREG 6.25 MG TABS (CARVEDILOL) Take 1 tablet by mouth two times a day  #180 x 1   Entered and Authorized by:   Ulyess Mort MD   Signed by:   Ulyess Mort MD on 11/23/2007   Method used:   Telephoned to ...       [facility]       [address] [place], [place]  [address]  [place]       Ph:  [phone]       Fax: [phone]   RxID:   [id number] BENAZEPRIL HCL 40 MG TABS (BENAZEPRIL HCL) Take 1 tablet by mouth once a day for blood pressure. Take this after finishing the Lisinopril  #90 x 1   Entered and Authorized by:   Ulyess Mort MD   Signed by:   Ulyess Mort MD on 11/23/2007   Method used:   Telephoned to ...       [facility]       [address] [place], [place]  [address]  [place]       Ph: [phone]       Fax: [phone]   RxID:   [id number]

## 2011-01-23 NOTE — Assessment & Plan Note (Signed)
Summary: diarrhea/gg   Vital Signs:  Patient Profile:   55 Years Old Female Height:     63 inches (160.02 cm) Weight:      189.3 pounds (86.05 kg) BMI:     33.65 Temp:     97.9 degrees F (36.61 degrees C) oral Pulse rate:   77 / minute BP sitting:   136 / 95  (right arm)  Pt. in pain?   yes    Location:   abd cramps    Intensity:   1-3  Vitals Entered By: Stanton Kidney Ditzler RN (July 07, 2007 3:48 PM)              Is Patient Diabetic? No Nutritional Status BMI of > 30 = obese Nutritional Status Detail down  Have you ever been in a relationship where you felt threatened, hurt or afraid?denies   Does patient need assistance? Functional Status Self care Ambulation Normal   PCP:  Joaquin Courts  MD  Chief Complaint:  Diarrhea past 4 days-watery to soft BM- 10x/day. Taking OTC meds for diarrhea. c/o of white vag discharge with ext itching and burning ext vag.Marland Kitchen  History of Present Illness: Pt is a 55 yo AA female here b/c of a 4 day hx of non-bloody diarrhea with some associated nausea and abdominal cramping.  She denies any hx of fever, chills, and vomitting.  She reports up to 10 watery BM's per day.  No orthostatic symptoms or travel hx. She has not eaten anything unusual.   Her mom, who she lives with, started having dairrhea yesterday.    She also would like evaluation for a whitish vaginal discharge with associated vaginal burning and itching.  This started shortly before the diarrhea.  She reports have unprotected sexual relations wtih her boyfriend shortly before these symptoms started and is worried she might have an STD.  She has also noted some dysuria.  She also thinks it could be due to her changing soaps frequently.  Her last pap smear was in 2005 and she has no known hx of abnormals.       Current Allergies (reviewed today): No known allergies    Social History:    never smoked.     Risk Factors:  Tobacco use:  quit Alcohol use:  no Exercise:  no Seatbelt  use:  100 %  PAP Smear History:    Date of Last PAP Smear:  08/01/2004   Review of Systems       As per HPI.     Physical Exam  General:     alert, appropriate dress, and normal appearance.   Mouth:     poor dentition, multiple caries, no oropharyngeal exudates.   Lungs:     CTA bilaterallly, nl respiratory effort. Heart:     RRR, no m/r/g Abdomen:     +BS's, soft, NT globally, no distention.   Genitalia:     Introitus with a whitish, foul smelling  d/c.  No lesions or condylomas.  Some erythema noted on the vulva bilaterally.  Whitish d/c noted through the vaginal vault.  Vaginal and cervix pink and moist. Whitish, slightly yellowish d/c from the cervix noted, but no erythema or lesions seen.  No cervical motion tenderness or adnexal tenderness.      Impression & Recommendations:  Problem # 1:  DIARRHEA, ACUTE (ICD-787.91) B/c of the symptoms, hx, and very benign abdominal exam, this is most likely related to a viral gastroenteritis.  B/c the pt complains of dysuria  and the urine dipstick indicated trace LE, I will order a UA and cx.  I feel like the dysuria is probably related to her vaginal symptoms.   I explained to the pt that I didn't feel like it was a good idea to give her an antidiarrheal medication at this time.  I told her to f/u if the diarrhea didn't resolve over the next week.    Orders: T-Urinalysis Dipstick only ([id number])   Problem # 2:  VAGINAL DISCHARGE (ICD-623.5) Pt with a 4 day hx of white vaginal d/c, dysuria, vaginal itching and vaginal burning.  I think her sx's and PE are consistent with a yeast infection and I will go ahead and tx empirically with diflucan 150 mg x1 dose.  However, b/c of her recent unprotected sexual encounter, I can not r/o an STD, so I sent specimens off for GC, chlamydia, and a wet prep.  I will call her at [phone] if any of the mentioned test results are positive.  Since the pt has not had a pap smear in 3 years, I went ahead and  did a pap smear at this time.  I told her to f/u with her primary care b/c she still needs a breast exam.  Orders: T-GC Probe, genital [id number]) T-Chlamydia ([id number]) T-Wet Prep (in-house) ([id number]) T-Pap Smear, Thin Prep ([id number])   Medications Added to Medication List This Visit: 1)  Fluconazole 150 Mg Tabs (Fluconazole) .... Take 1 tablet by mouth once a day  Complete Medication List: 1)  Coreg 6.25 Mg Tabs (Carvedilol) .... Take 1 tablet by mouth two times a day 2)  Benazepril Hcl 40 Mg Tabs (Benazepril hcl) .... Take 1 tablet by mouth once a day for blood pressure. take this after finishing the lisinopril 3)  Antivert 12.5 Mg Tabs (Meclizine hcl) .Marland Kitchen.. 1-2 tablets every 4 hours as needed for dizziness 4)  Valium 2 Mg Tabs (Diazepam) .... Take 1 tablet by mouth two times a day as needed anxiety 5)  Zocor 40 Mg Tabs (Simvastatin) .... Take 1 tablet by mouth once a day 6)  Promethazine Hcl 25 Mg Tabs (Promethazine hcl) .... Take one tablet every 12 hours as needed for nausea 7)  Effexor Xr 75 Mg Cp24 (Venlafaxine hcl) .... Take 2 capsules by mouth once a day 8)  Hydrochlorothiazide 25 Mg Tabs (Hydrochlorothiazide) .... Take 1 tablet by mouth once a day 9)  Darvocet-n 100 100-650 Mg Tabs (Propoxyphene n-apap) .... Take 1 tablet every 6 hours as needed for headache 10)  Fluconazole 150 Mg Tabs (Fluconazole) .... Take 1 tablet by mouth once a day  Other Orders: T-Culture, Urine ([id number]) T-Urinalysis ([id number])   Patient Instructions: 1)  Please follow up in 2 weeks for a routine check up.      Prescriptions: FLUCONAZOLE 150 MG  TABS (FLUCONAZOLE) Take 1 tablet by mouth once a day  #1 x 0   Entered and Authorized by:   Joaquin Courts  MD   Signed by:   Joaquin Courts  MD on 07/07/2007   Method used:   Print then Give to Patient   RxID:   [id number]     Laboratory Results   Urine Tests  Date/Time Recieved: 07/07/07 4:35PM Date/Time Reported: 07/07/07  4:35PM  Routine Urinalysis   Color: yellow Appearance: Clear Glucose: negative   (Normal Range: Negative) Bilirubin: negative   (Normal Range: Negative) Ketone: negative   (Normal Range: Negative) Spec. Gravity: 1.010   (Normal Range: 1.003-1.035) Blood: negative   (Normal  Range: Negative) pH: 5.0   (Normal Range: 5.0-8.0) Protein: negative   (Normal Range: Negative) Urobilinogen: 0.2   (Normal Range: 0-1) Nitrite: negative   (Normal Range: Negative) Leukocyte Esterace: trace   (Normal Range: Negative)        Appended Document: diarrhea/gg    Clinical Lists Changes  Medications: Added new medication of METRONIDAZOLE 500 MG  TABS (METRONIDAZOLE) Take 1 tablet by mouth two times a day - Signed Rx of METRONIDAZOLE 500 MG  TABS (METRONIDAZOLE) Take 1 tablet by mouth two times a day;  #14 x 0;  Signed;  Entered by: Joaquin Courts  MD;  Authorized by: Joaquin Courts  MD;  Method used: Electronic    Prescriptions: METRONIDAZOLE 500 MG  TABS (METRONIDAZOLE) Take 1 tablet by mouth two times a day  #14 x 0   Entered and Authorized by:   Joaquin Courts  MD   Signed by:   Joaquin Courts  MD on 07/08/2007   Method used:   Electronically sent to ...       [facility] [address]       [address]       [place], [place]  [address]       Ph:        Fax:    RxID:   [id number]

## 2011-01-23 NOTE — Progress Notes (Signed)
  Phone Note Call from Patient   Caller: Patient Reason for Call: Acute Illness, Refill Medication, Talk to Doctor Complaint: Headache Action Taken: Phone Call Completed, Rx Called In Summary of Call: Patient called requesting Darvocet refill. Last filled in November. She c/o migraine headache. HA resolved when patient called. I advised the patient to call the clinic on Monday to schedule an appointment for follow-up(she did not understand why she needed to do this, she also did not know she had health problems that needed to be routinely followed up on aka CAD, Hypertension).  I screened her for the warning signs of emergent/life threatening HA, all absent. I will Fax in a limited script for Darvocet today and told her if her HA returned and was worse of she had other symptoms such as vision loss, weakness, etc...to be evaluated in the ER or urgent care. Initial call taken by: Julaine Fusi  DO,  December 18, 2007 1:05 PM      Prescriptions: DARVOCET A500 100-500 MG  TABS (PROPOXYPHENE N-APAP) take one tab by mouth once every 6 hours as needed for headache  #30 x 0   Entered and Authorized by:   Julaine Fusi  DO   Signed by:   Julaine Fusi  DO on 12/18/2007   Method used:   Printed then faxed to ...       [address]       [address]       [place], [place]  [address]       Ph: [phone]       Fax: [phone]   RxID:   [id number]     Appended Document:  please call for F/U appointment with PCP re: after-hours call- see phone note.  Appended Document:  12/20/07 appt set and given to pt for 12/28/06 @ 2pm,pt stated she resented dr Phillips Odor referring to her "health issues", needing appts for eval on a regular basis and being funny about it, pt was told that she was informed of this to help her address her healthcare/ med problems and no one was being funny.

## 2011-01-23 NOTE — Progress Notes (Signed)
Summary: Butalbital-APAP-caffeine available at pharmacy  Phone Note Outgoing Call   Summary of Call: Pt. had called stating could not get butalbital-APAP-caffeine from [facility] [address]  They didn't have capsules, but could substitute tablets.  Attempted to notify pt., phone # changed, disconnected or no longer in service.  If she calls back, she can pick up her Rx from pharmacy. Initial call taken by: Dutch Quint RN,  January 13, 2011 5:22 PM  Follow-up for Phone Call        Attempted to notify pt., phone # changed, disconnected or no longer in service.  Letter sent.  Dutch Quint RN  January 14, 2011 9:48 AM      Appended Document: Butalbital-APAP-caffeine available at pharmacy Pt. called with new phone #.  Notified of available Rx ready for pick-up at [facility] [address]  Dutch Quint RN  January 14, 2011 12:51 PM

## 2011-01-23 NOTE — Progress Notes (Signed)
Summary: letter/ hla  Phone Note Other Incoming   Summary of Call: Called by ED Physician. Catherine [name] was seen today with complaints of rectal burning and blood on toilet paper upon wiping. Hb is stable. They would like her to be seen in clinic ASAP for a followup appointment and possible GI referral. Initial call taken by: Peggye Pitt MD,  April 20, 2008 9:55 PM  Follow-up for Phone Call        attempted to call pt, phone says she cannot receive calls at this time Follow-up by: Marin Roberts RN,  Apr 21, 2008 12:01 PM  Additional Follow-up for Phone Call Additional follow up Details #1::        We should probably send her a letter with an apppointment date. Additional Follow-up by: Peggye Pitt MD,  Apr 25, 2008 3:12 PM

## 2011-01-23 NOTE — Assessment & Plan Note (Signed)
Summary: ACUTE-ER/FU VISIT PER GAYLE/CFB   Vital Signs:  Patient Profile:   55 Years Old Female Height:     62 inches (157.48 cm) Weight:      191.4 pounds (87 kg) BMI:     35.13 Temp:     98.4 degrees F (36.89 degrees C) oral Pulse rate:   63 / minute BP sitting:   166 / 91  (right arm)  Pt. in pain?   yes    Location:   headache    Intensity:   10  Vitals Entered By: Catherine [name]) (May 27, 2007 3:34 PM)              Is Patient Diabetic? No Nutritional Status BMI of > 30 = obese  Have you ever been in a relationship where you felt threatened, hurt or afraid?No   Does patient need assistance? Functional Status Self care Ambulation Normal   PCP:  Catherine Carpen MD  Chief Complaint:  ER f/u elevated blood pressure and headacde-was given rx for lasix 20mg  daily yesterday at urgent care but has not taken yet.  History of Present Illness: Catherine [name] is a 55 y/o with CAD, HTN and other medical problems presenting to the clinic today for a f/u on her BP. She was seen at the urgent care yesterday for high BP and headache and was prescribed lasix which pt has filled but not taken yet. She reports still having headaches 2/2 to elevated BP. She reports having well controlled BPs in the past. She apparently has not had her eyes tested for the last 4 years and has some blurring of vision as well. She however presents to the clinic today without wearing her glasses. She gives no h/o CP,SOB,palpitations, nausea, weakness at this visit.   Current Allergies: No known allergies     Risk Factors: Tobacco use:  never  PAP Smear History:    Date of Last PAP Smear:  08/01/2004   Review of Systems      See HPI   Physical Exam  General:     alert, well-developed, well-nourished, and well-hydrated.   Head:     atraumatic.   Eyes:     pupils equal, pupils round, and pupils reactive to light.   Mouth:     pharynx pink and moist.   Neck:     supple.   Lungs:  normal respiratory effort, normal breath sounds, no crackles, and no wheezes.   Heart:     normal rate, regular rhythm, no murmur, no gallop, and no rub.      Impression & Recommendations:  Problem # 1:  SYMPTOM, HEADACHE (ICD-784.0) Most likely 2/2 to elevated BP. This could be vision related as well hence I will refer her to an ophthalmologist. Please see section on HTN. Orders: Ophthalmology Referral (Ophthalmology)   Problem # 2:  HYPERTENSION (ICD-401.9) Pt is not on a diuretic , hence I will start her on one today. I believe HCTZ has a better effect on BP than Lasix. Pt was informed to d/c Lasix. Samples of HCTZ as well as a prescription for the same was given to the pt. She will RTC in one weeks time for a BMET to look for electrolyte abnormalities. The following medications were removed from the medication list:    Lisinopril 10 Mg Tabs (Lisinopril) .Marland Kitchen... Take 4 tablet by mouth once a day  Her updated medication list for this problem includes:    Coreg 6.25 Mg Tabs (Carvedilol) .Marland KitchenMarland KitchenMarland KitchenMarland Kitchen  Take 1 tablet by mouth two times a day    Benazepril Hcl 40 Mg Tabs (Benazepril hcl) .Marland Kitchen... Take 1 tablet by mouth once a day for blood pressure. take this after finishing the lisinopril    Hydrochlorothiazide 25 Mg Tabs (Hydrochlorothiazide) .Marland Kitchen... Take 1 tablet by mouth once a day  Future Orders: T-Basic Metabolic Panel [id number]) ... 06/03/2007   Medications Added to Medication List This Visit: 1)  Hydrochlorothiazide 25 Mg Tabs (Hydrochlorothiazide) .... Take 1 tablet by mouth once a day   Patient Instructions: 1)  Please schedule a follow-up appointment in one week with Catherine [name]. 2)  Check your Blood Pressure regularly. If it is above 140/90 consistently: you should make an appointment. 3)  Make sure you keep your eye appointment and wear your glasses all the time. 4)  Take 650-1000mg  of Tylenol every 4-6 hours as needed for relief of pain or comfort of fever AVOID taking more than  4000mg   in a 24 hour period (can cause liver damage in higher doses).

## 2011-01-23 NOTE — Assessment & Plan Note (Signed)
Summary: acute insomnia x2 wks/(Rodriguez)/dms   Vital Signs:  Patient Profile:   55 Years Old Female Height:     62 inches (157.48 cm) Weight:      191.6 pounds BMI:     35.17 Temp:     97.1 degrees F oral Pulse rate:   66 / minute BP sitting:   131 / 88  (right arm) Cuff size:   regular  Pt. in pain?   yes    Location:   headache    Intensity:   9    Type:       heaviness  Vitals Entered By: Catherine [name] (June 11, 2007 10:26 AM)              Is Patient Diabetic? No Nutritional Status normal  Does patient need assistance? Functional Status Self care Ambulation Normal  Prescriptions: ZOCOR 40 MG TABS (SIMVASTATIN) Take 1 tablet by mouth once a day  #30 x 3   Entered and Authorized by:   Peggye Pitt MD   Signed by:   Peggye Pitt MD on 06/11/2007   Method used:   Print then Give to Patient   RxID:   [id number] DARVOCET-N 100 100-650 MG  TABS (PROPOXYPHENE N-APAP) Take 1 tablet every 6 hours as needed for headache  #30 x 0   Entered and Authorized by:   Peggye Pitt MD   Signed by:   Peggye Pitt MD on 06/11/2007   Method used:   Print then Give to Patient   RxID:   [id number]    PCP:  Coralie Carpen MD  Chief Complaint:  headache started yesterday/ seen at Urgent Care.  History of Present Illness: Catherine [name] is a 31 t/o woman patient of Dr.Rodriguez. She is coming in today for evaluation of headaches. She had to go to [facility] yesterday. HA is over left side of head, sharp,no photophobia , n/v or stiff neck. She did not see the ophtalmologist as Dr. Liliane Channel had suggested. Her BP is much improved with addition of HCTZ.  Current Allergies: No known allergies     Risk Factors:  Alcohol use:  no Exercise:  no Seatbelt use:  100 %   Review of Systems  The patient denies fever, chest pain, dyspnea on exhertion, abdominal pain, and melena.     Physical Exam  General:     Well-developed,well-nourished,in no acute distress;  alert,appropriate and cooperative throughout examination Eyes:     No corneal or conjunctival inflammation noted. EOMI. Perrla. Vision grossly normal. Neck:     no bruits Lungs:     Normal respiratory effort, chest expands symmetrically. Lungs are clear to auscultation, no crackles or wheezes. Heart:     Normal rate and regular rhythm. S1 and S2 normal without gallop, murmur, click, rub or other extra sounds. Extremities:     No clubbing, cyanosis, edema, or deformity noted with normal full range of motion of all joints.      Impression & Recommendations:  Problem # 1:  SYMPTOM, HEADACHE (ICD-784.0) HA has already resolved with vicodin given at urgent care. I suspect they might be related with a refractive error, because she also c/o difficulty seeing far away and has been squinting a lot. Have given her a limited supply of darvocet to help with any more acute HA she may experience.  Her updated medication list for this problem includes:    Darvocet-n 100 100-650 Mg Tabs (Propoxyphene n-apap) .Marland Kitchen... Take 1 tablet every 6 hours as needed for headache  Problem # 2:  HYPERLIPIDEMIA (ICD-272.4) Have refilled zocor today.  Her updated medication list for this problem includes:    Zocor 40 Mg Tabs (Simvastatin) .Marland Kitchen... Take 1 tablet by mouth once a day   Problem # 3:  HYPERTENSION (ICD-401.9) BP much improved with addition of HCTZ. Will check BMET today to monitor electrolytes and renal function.  Her updated medication list for this problem includes:    Coreg 6.25 Mg Tabs (Carvedilol) .Marland Kitchen... Take 1 tablet by mouth two times a day    Benazepril Hcl 40 Mg Tabs (Benazepril hcl) .Marland Kitchen... Take 1 tablet by mouth once a day for blood pressure. take this after finishing the lisinopril    Hydrochlorothiazide 25 Mg Tabs (Hydrochlorothiazide) .Marland Kitchen... Take 1 tablet by mouth once a day  Orders: T-Basic Metabolic Panel [id number])   Medications Added to Medication List This Visit: 1)  Darvocet-n  100 100-650 Mg Tabs (Propoxyphene n-apap) .... Take 1 tablet every 6 hours as needed for headache   Patient Instructions: 1)  Please schedule a follow-up appointment in 3 months with your regular doctor. 2)  You may take the darvocet every 6 hours as needed for pain.

## 2011-01-23 NOTE — Miscellaneous (Signed)
  Clinical Lists Changes Patient called complaining of persistent headache not relieved with ibuprofen.  Wanted medicine prescribed back in June, which was noted to be darvocet.  I will provide a 15 day supply of darvocet, and advised her that if the pain persists, whe will need to make an appointment to be seen in the clinic. Of note is that she [facility] [address], and she told me that she usually gets her medications from [facility].  When she is seen again in the clinic, a medication reconciliation should be considered, and these three pharmacies can be checked to verify what medications she is on and where she is getting them from. Medications: Added new medication of DARVOCET A500 100-500 MG  TABS (PROPOXYPHENE N-APAP) take one tab by mouth once every 6 hours as needed for headache - Signed Rx of DARVOCET A500 100-500 MG  TABS (PROPOXYPHENE N-APAP) take one tab by mouth once every 6 hours as needed for headache;  #30 x 0;  Signed;  Entered by: Valetta Close MD;  Authorized by: Valetta Close MD;  Method used: Electronic    Prescriptions: DARVOCET A500 100-500 MG  TABS (PROPOXYPHENE N-APAP) take one tab by mouth once every 6 hours as needed for headache  #30 x 0   Entered and Authorized by:   Valetta Close MD   Signed by:   Valetta Close MD on 10/24/2007   Method used:   Electronically sent to ...       [address]       [address]       [place], [place]  [address]       Ph: [phone]       Fax: [phone]   RxID:   [id number]

## 2011-01-23 NOTE — Progress Notes (Signed)
Summary: complaint/ hla  Phone Note Call from Patient   Caller: Patient Summary of Call: pt calls to state her attorney will be giving dr Andrey Campanile and the [facility] notice of suit due to being treated unfairly, she states she wants me to tell "that hieffer...dr Andrey Campanile" that she is tired of her.  Initial call taken by: Marin Roberts RN,  December 19, 2009 9:48 AM  Follow-up for Phone Call        Thank you for relaying the message.  Follow-up by: Joaquin Courts  MD,  December 19, 2009 1:02 PM

## 2011-01-23 NOTE — Progress Notes (Signed)
Summary: phone note/gp  Phone Note Call from Patient   Caller: Patient Summary of Call: Pt.states she is out of all her medications and Jaynee Eagles is not in today. Pt. has plenty of refills.  I called [facility] pharm. and she said pt.'s card has expired  and she needs to see D.Hill.  I told her D.Hill has been out the last 2 days d/t illness. Pt. said the paperwork that she neede came yesterday. I told the pt. to call the [facility] pharm again to see if they could give her enough med. to last until Monday when D. Hill comes back to work.  She said she will try. Initial call taken by: Chinita Pester RN,  November 23, 2009 11:40 AM  Follow-up for Phone Call        I called pt. back; she called the [facility] pharm. and they will give her refills on 5 of her meds.  I instructed pt. to come in Monday and talk to D. Hill to get a new card. She agreed. Follow-up by: Chinita Pester RN,  November 23, 2009 11:55 AM

## 2011-01-23 NOTE — Progress Notes (Signed)
  Phone Note Refill Request  on October 27, 2007 6:00 PM  Refills Requested: Medication #1:  EFFEXOR XR 75 MG CP24 Take 2 capsules by mouth once a day  Medication #2:  HYDROCHLOROTHIAZIDE 25 MG TABS Take 1 tablet by mouth once a day  Medication #3:  COREG 6.25 MG TABS Take 1 tablet by mouth two times a day  Medication #4:  VALIUM 2 MG TABS Take 1 tablet by mouth two times a day as needed anxiety

## 2011-01-23 NOTE — Letter (Signed)
Summary: Generic Letter  [facility]  [address] [place], [place] [address]   Phone: [phone]  Fax: [phone]        01/14/2011  [facility] [address] [place], [place]  [address]  Dear Ms. Dykman,  We have been unable to contact you by telephone.  Please call our office, at your earliest convenience, so that we may speak with you.   Sincerely,   Dutch Quint RN

## 2011-01-23 NOTE — Progress Notes (Signed)
Summary: REFILLGP  Phone Note Refill Request  on September 16, 2007 9:03 AM  Refills Requested: Medication #1:  COREG 6.25 MG TABS Take 1 tablet by mouth two times a day   Last Refilled: 08/20/2007  Medication #2:  BENAZEPRIL HCL 40 MG TABS Take 1 tablet by mouth once a day for blood pressure. Take this after finishing the Lisinopril   Last Refilled: 08/24/2007  Medication #3:  VALIUM 2 MG TABS Take 1 tablet by mouth two times a day as needed anxiety   Last Refilled: 08/24/2007  Medication #4:  HYDROCHLOROTHIAZIDE 25 MG TABS Take 1 tablet by mouth once a day   Last Refilled: 06/10/2007 LAB - BMP DONE 6/08  Initial call taken by: Chinita Pester RN,  September 16, 2007 9:10 AM  Follow-up for Phone Call        Refill approved-nurse to complete  Will refill for 1 month until her 10-28 app't. Follow-up by: Ulyess Mort MD,  September 16, 2007 9:31 AM      Prescriptions: HYDROCHLOROTHIAZIDE 25 MG TABS (HYDROCHLOROTHIAZIDE) Take 1 tablet by mouth once a day  #31 x 1   Entered by:   Ulyess Mort MD   Authorized by:   Marland Kitchen [facility] ATTENDING DESKTOP   Signed by:   Ulyess Mort MD on 09/16/2007   Method used:   Telephoned to ...       [facility]       [address] [place], [place]  [address]  [place]       Ph: [phone]       Fax: [phone]   RxID:   [id number] VALIUM 2 MG TABS (DIAZEPAM) Take 1 tablet by mouth two times a day as needed anxiety  #60 x 1   Entered by:   Ulyess Mort MD   Authorized by:   Marland Kitchen [facility] ATTENDING DESKTOP   Signed by:   Ulyess Mort MD on 09/16/2007   Method used:   Telephoned to ...       [facility]       [address] [place], [place]  [address]  [place]       Ph: [phone]       Fax: [phone]   RxID:   [id number] BENAZEPRIL HCL 40 MG TABS (BENAZEPRIL HCL) Take 1 tablet by mouth once a day for blood pressure. Take this after finishing the Lisinopril  #30 x 1   Entered  by:   Ulyess Mort MD   Authorized by:   Marland Kitchen [facility] ATTENDING DESKTOP   Signed by:   Ulyess Mort MD on 09/16/2007   Method used:   Telephoned to ...       [facility]       [address] [place], [place]  [address]  [place]       Ph: [phone]       Fax: [phone]   RxID:   [id number] COREG 6.25 MG TABS (CARVEDILOL) Take 1 tablet by mouth two times a day  #62 x 1   Entered by:   Ulyess Mort MD   Authorized by:   Marland Kitchen [facility] ATTENDING DESKTOP   Signed by:   Ulyess Mort MD on 09/16/2007   Method used:   Telephoned to ...       [facility]       [address] [place]  [place], [place]  [address]  [place]       Ph: [phone]       Fax: [phone]   RxID:   [id number]

## 2011-01-23 NOTE — Assessment & Plan Note (Signed)
Summary: discuss medication/pcp-Ranata Laughery/hla   Vital Signs:  Patient profile:   55 year old female Height:      63 inches (160.02 cm) Weight:      190.1 pounds (86.41 kg) BMI:     33.80 Temp:     97.6 degrees F oral Pulse rate:   67 / minute BP sitting:   134 / 81  (right arm)  Vitals Entered By: Chinita Pester RN (April 04, 2010 1:34 PM) CC: Medication for  anxiety is not working. Is Patient Diabetic? No Pain Assessment Patient in pain? no      Nutritional Status BMI of > 30 = obese  Have you ever been in a relationship where you felt threatened, hurt or afraid?No   Does patient need assistance? Functional Status Self care Ambulation Normal   Primary Care Provider:  Joaquin Courts  MD  CC:  Medication for  anxiety is not working.Marland Kitchen  History of Present Illness: Pt is a 55 yo female w/ past med hx below here for routine f/u.  She saw Dr. Gaylyn Rong in heme/onc b/c of + Bence Jones proteins seen on UA but forgot to return her urine studies he ordered.  She was told she was not anemic at that time.  She notes continued anxiety symptoms and panic attacks and says she would be willing to be seen at [facility] now.  She stopped the paxil b/c it made her too jittery. Her nerves are still bothering her and she is still upset that I discontinued her benzo's and would like them refilled.    Depression History:      The patient is having a depressed mood most of the day but denies diminished interest in her usual daily activities.         Preventive Screening-Counseling & Management  Alcohol-Tobacco     Alcohol drinks/day: 0     Smoking Status: never  Caffeine-Diet-Exercise     Does Patient Exercise: no  Current Medications (verified): 1)  Coreg 6.25 Mg Tabs (Carvedilol) .... Take 1 Tablet By Mouth Two Times A Day 2)  Antivert 12.5 Mg Tabs (Meclizine Hcl) .Marland Kitchen.. 1-2 Tablets Every 4 Hours As Needed For Dizziness 3)  Pravachol 10 Mg  Tabs (Pravastatin Sodium) .... Take 1 Tablet By Mouth  Once A Day Before Bed. 4)  Nitrostat 0.4 Mg  Subl (Nitroglycerin) .... Take One Tab Under The Tongue As Needed For Severe Chest Pain.  Do Not Use More Than One A Day. 5)  Norvasc 5 Mg Tabs (Amlodipine Besylate) .... Take 1 Tablet By Mouth Once A Day 6)  Omeprazole 20 Mg Cpdr (Omeprazole) .... Take 1 Tablet By Mouth Once A Day  Allergies (verified): 1)  ! Cipro  Past History:  Past Surgical History: Last updated: 05/11/2007 Tubal ligation  Social History: Last updated: 02/22/2010 Never smoked, no alcohol, divorced.  Recently laid off from her job and moved in with her mother.  Past Medical History: CAD: S/p cardiac catherization by Dr.Ganji(3/08)          Mild LAD stenosis (60%)          Ef 50-55%          Medical mangement        w/ repeat cath in 12/08 by Dr. Lynnea Ferrier: essentially normal coronary          arteries with a possible 20% mid left anterior descending lesion versus         a myocardial bridge. A f/u myoview as  an outpt was to be done which she did have done.   ? 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 Never smoked, no alcohol, divorced.  Recently laid off from her job and moved in with her mother.  Review of Systems       as per hpi.   Physical Exam  General:  alert, oriented, no distress.  Eyes:  anicteric.  Mouth:  MMM, poor dentition.  Lungs:  nl respiratory effort, CTA bilaterally.  Heart:  RRR, no murmur, no gallop, and no rub.   Abdomen:  +BS's, soft, NT and ND.  Extremities:  no peripheral edema.  Neurologic:  gait normal.  Psych:  flat affect, poor eye contact.     Impression & Recommendations:  Problem # 1:  * ? 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 Lodema Pilot office.  I have reminded her to do so b/c if this is abnormal, she may need further workup.  Problem # 2:  HYPERTENSION (ICD-401.9) At goal.  HCTZ/ACE I d/c'd b/c or worsening renal fxn, however, this was transient so may be able to restart in the future if needed.  Her updated medication list for this problem includes:    Coreg 6.25 Mg Tabs (Carvedilol) .Marland Kitchen... Take 1 tablet by mouth two times a day    Norvasc 5 Mg Tabs (Amlodipine besylate) .Marland Kitchen... Take 1 tablet by mouth once a day  Problem # 3:  ANXIETY DISORDER, GENERALIZED (ICD-300.02) Paxil did not help.  She reports continued severe symptoms.  She denies SI/HI.   I stopped her benzo's b/c of 3 UDS's, including clonipin specific testing, was neg. Mult SSRI's have been tried without any results along w/ buspar.  Given her symptoms, I have recommended she revist [facility] and I am not comfortable writing for controlled substances for her.  The following medications were removed from the medication list:    Paxil 20 Mg Tabs (Paroxetine hcl) .Marland Kitchen... Take 1 tablet by mouth every morning  Problem # 4:  HYPERLIPIDEMIA (ICD-272.4) Lipids/LFT's today.  Her updated medication list for this problem includes:    Pravachol 10 Mg Tabs (Pravastatin sodium) .Marland Kitchen... Take 1 tablet by mouth once a day before bed.  Orders: T-Lipid Profile ([id number])  Complete Medication List: 1)  Coreg 6.25 Mg Tabs (Carvedilol) .... Take 1 tablet by mouth two times a day 2)  Antivert 12.5 Mg Tabs (Meclizine hcl) .Marland Kitchen.. 1-2 tablets every 4 hours as needed for dizziness 3)  Pravachol 10 Mg Tabs (Pravastatin sodium) .... Take 1 tablet by mouth once a day before bed. 4)  Nitrostat 0.4 Mg Subl (Nitroglycerin) .... Take one tab under the tongue as needed for severe chest pain.  do not use more than one a day. 5)  Norvasc 5 Mg Tabs (Amlodipine besylate) ....  Take 1 tablet by mouth once a day 6)  Omeprazole 20 Mg Cpdr (Omeprazole) .... Take 1 tablet by mouth once a day  Other Orders: T-Comprehensive Metabolic Panel ([id number]) Tdap => 108yrs IM ([address]) Admin 1st Vaccine ([address])  Patient Instructions: 1)  Please make a followup appointment in 2 months for a checkup. 2)  You will be  called with any abnormal labwork.  Please make sure your phone number is correct at the front desk. 3)  Please go to the [facility].   Prevention & Chronic Care Immunizations   Influenza vaccine: Not documented   Influenza vaccine deferral: Refused  (11/07/2009)    Tetanus booster: 04/04/2010: Tdap   Td booster deferral: Deferred  (02/06/2010)    Pneumococcal vaccine: Not documented   Pneumococcal vaccine deferral: Deferred  (02/06/2010)  Colorectal Screening   Hemoccult: Not documented   Hemoccult action/deferral: Refused  (01/23/2010)    Colonoscopy: Not documented   Colonoscopy action/deferral: Refused  (01/23/2010)  Other Screening   Pap smear:  Specimen Adequacy: Satisfactory for evaluation.   Interpretation/Result:Negative for intraepithelial Lesion or Malignancy.     (07/20/2008)   Pap smear action/deferral: Ordered  (11/07/2009)   Pap smear due: 07/2008    Mammogram: ASSESSMENT: Negative - BI-RADS 1^MM DIGITAL SCREENING  (04/23/2009)   Smoking status: never  (04/04/2010)  Lipids   Total Cholesterol: 206  (03/28/2009)   Lipid panel action/deferral: Lipid Panel ordered   LDL: 90  (03/28/2009)   LDL Direct: Not documented   HDL: 75  (03/28/2009)   Triglycerides: 206  (03/28/2009)    SGOT (AST): 17  (02/22/2010)   BMP action: Ordered   SGPT (ALT): 10  (02/22/2010) CMP ordered    Alkaline phosphatase: 77  (02/22/2010)   Total bilirubin: 0.4  (02/22/2010)    Lipid flowsheet reviewed?: Yes   Progress toward LDL goal: At goal  Hypertension   Last Blood Pressure: 134 / 81  (04/04/2010)   Serum creatinine: 0.93  (02/22/2010)    BMP action: Ordered   Serum potassium 3.8  (02/22/2010) CMP ordered     Hypertension flowsheet reviewed?: Yes   Progress toward BP goal: At goal  Self-Management Support :   Personal Goals (by the next clinic visit) :      Personal blood pressure goal: 130/80  (04/04/2010)     Personal LDL goal: 100  (04/04/2010)    Patient will work on the following items until the next clinic visit to reach self-care goals:     Medications and monitoring: take my medicines every day, bring all of my medications to every visit  (04/04/2010)     Eating: drink diet soda or water instead of juice or soda, eat more vegetables, use fresh or frozen vegetables, eat foods that are low in salt, eat baked foods instead of fried foods, eat fruit for snacks and desserts, limit or avoid alcohol  (02/22/2010)     Activity: take a 30 minute walk every day  (04/04/2010)    Hypertension self-management support: Written self-care plan  (04/04/2010)   Hypertension self-care plan printed.    Lipid self-management support: Written self-care plan  (04/04/2010)   Lipid self-care plan printed.   Nursing Instructions: Give tetanus booster today   Process Orders Check Orders Results:     [facility]: ABN not required for this insurance Tests Sent for requisitioning (April 07, 2010 11:00 AM):     04/04/2010: [facility] -- T-Lipid Profile [id number] (signed)     04/04/2010: [facility] -- T-Comprehensive Metabolic Panel [id number] (signed)     Tetanus/Td Vaccine    Vaccine Type: Tdap    Site: left deltoid    Mfr: GlaxoSmithKline    Dose: 0.5 ml    Route: IM    Given by: Chinita Pester RN    Exp. Date: 03/15/2012    Lot #: [id number]  VIS given: 11/09/07 version given April 04, 2010.

## 2011-01-23 NOTE — Progress Notes (Signed)
Summary: complaint/ hla  Phone Note Call from Patient   Summary of Call: pt calls c/o that dr Andrey Campanile had no right to do a drug test on her and she just wants to let everyone know in the clinic that they are too educated and don't know how to treat people. i offered to transfer her to jim shaw and she said she didn't want to talk to him, she already had and he is a Sales promotion account executive. i ask what i could do for her and she said nothing except tell that so called dr Andrey Campanile, the low life she can't treat people like dogs and that i couldn't either and that's all she wanted to say. Initial call taken by: Marin Roberts RN,  May 17, 2010 5:02 PM  Follow-up for Phone Call        Thank you, Myriam Jacobson.  We have been dealing with this behavior for a long time now and I appreciate your pateince.  Follow-up by: Joaquin Courts  MD,  May 17, 2010 6:13 PM

## 2011-01-23 NOTE — Progress Notes (Signed)
Summary: Abdominal pain  Phone Note Call from Patient   Caller: Patient Call For: Joaquin Courts  MD Summary of Call: Call from pt states is having problems with her stomach since she drank a Crystal light on yesteday.  Pt is having abdominal pain at a level of 8 when asked.  Pt is having no nausea or vomiting or diarrhea.  Just wants to get something for her upset stomac.  Says that Pepto Bismal makes her sik so she has not taken anything so far.  Says she is able to eat however.Angelina Ok RN  May 07, 2009 8:45 AM  Initial call taken by: Angelina Ok RN,  May 07, 2009 8:45 AM  Follow-up for Phone Call        Have her try an antacid and give her an appointment if symptom persists. Follow-up by: Ulyess Mort MD,  May 07, 2009 10:13 AM  Additional Follow-up for Phone Call Additional follow up Details #1::        RTC to pt given instructions to try an Antacid.  Pt will try Tums and call in the am iif her symptoms presist. Additional Follow-up by: Angelina Ok RN,  May 07, 2009 5:07 PM    Additional Follow-up for Phone Call Additional follow up Details #2::    I think an OTC antacid is a good idea.  If it persists, please make her an appointment. Follow-up by: Joaquin Courts  MD,  May 08, 2009 7:50 PM

## 2011-01-23 NOTE — Assessment & Plan Note (Signed)
Summary: Social Work  Social Work.  40 minutes. Met with patient in exam room. Catherine [name] continues to have issues with unemployment and her housing is in jeopardy.  She is tearful today and does not have any plan of action other than to stay with her mother if she is evicted. She continues to receive unemployment.   She has not followed up on many of the resources that I gave her back in October like [facility] and [employer].   Although she did attempt to file for disability recently she was told by [employer] that her unemployment claim would have to end.   We talked for quite some time about following up on resources and how important that will be.  I encouraged her to get a second opinion from Drue Stager re: disability claim as well. He will come to her for assessment if she calls him.   I encouraged her to utilize [employer] a free service to update her resume and get connected to jobs.  I gave her a list of resources and four bus passes so she could make some visits.   I encouraged her to visit with [employer] but she informed me that she lost her Section 8 housing about a year and a half ago because she had two family members living with her which were against the rules.   She gets foodstamps of $85 per month but right now she doesn't have much food.  I gave her a bag of groceries from our pantry.   Patient is not connected to church or pastor and tells me she has no one for support. However she has a brother who did take her here today so there is family in the picture. And she will have a place to go at her mom's if she is evicted.   Dr. Lafe Garin and I discussed the patient.  Dr. Lafe Garin wanted to concentrate on getting the patient's HTN under control.  At next visit, her mental health status, meds, and care should be reviewed.

## 2011-01-23 NOTE — Letter (Signed)
Summary: MAILED REQUESTED RECORDS TO [facility]  MAILED REQUESTED RECORDS TO [facility]   Imported By: Arta Bruce 12/17/2010 15:53:01  _____________________________________________________________________  External Attachment:    Type:   Image     Comment:   External Document

## 2011-01-23 NOTE — Progress Notes (Signed)
  Phone Note Call from Patient   Summary of Call: Pt has continued to call daily.  I have called her on three separate occasions explaining why I am not refilling her klonopin, that she is on an alternative tx (SSRI) that is not a controlled substance for her nerves, and placed psychiatry referrals.  We are in the process of scheduling a multidisciplinary meeting to discuss her care.  At this point, I am not going to call her back b/c it appears to be futile but we will try to schedule the multidisciplinary meeting as soon as possible in an effort to better meet her needs.   Initial call taken by: Joaquin Courts  MD,  January 16, 2010 10:51 AM     Appended Document:  Agree with the above plan.

## 2011-01-23 NOTE — Progress Notes (Signed)
Summary: Complaint-Catherine [name]  Phone Note From Other Clinic Call back at Office discussion   Details for Reason: Patient complaint Details of Complaint: Wants Xanax Details of Action Taken: Denied Xanax Summary of Call: Administrative Note Re:  Repetitive poor behavior Arrived in office complaining of her visit with Dr. Andrey Campanile.  Buspar making her sick, which she stopped.  She wanted Xanax or Klonopin, we discussed why she would not get it.She told me she has not been making frequent phone calls to clinic, which I don't think is correct.  She has not been keeping referrals, not keeping other appts.,  and did not go to mental health as requested by all, and she promised me specifically.  She also stated "Dr. Andrey Campanile refuses to tell her what's wrong with her kidneys".  The patient told me she is "going home to call her lawyer".  I spoke with Dr. Andrey Campanile, the patients nurse, and social work.  At this point I do not know how we can help her at the [facility].  I will discuss this patient again with the clinic medical director and recommend discharge unless she is willing to assume some responsibility for her care.  Please see previous documentation.  Clelia Croft Initial call taken by: Raynaldo Opitz,  February 06, 2010 2:12 PM

## 2011-01-23 NOTE — Consult Note (Signed)
Summary[facility]  [facility]   Imported By: Florinda Marker 03/13/2010 15:11:52  _____________________________________________________________________  External Attachment:    Type:   Image     Comment:   External Document

## 2011-01-23 NOTE — Progress Notes (Signed)
Summary: Refill/gh  Phone Note Refill Request Message from:  Pharmacy on January 29, 2009 10:09 AM  Refills Requested: Medication #1:  BENAZEPRIL HCL 40 MG TABS Take 1 tablet by mouth once a day for blood pressure. Take this after finishing the Lisinopril   Last Refilled: 12/21/2008  Method Requested: Electronic Initial call taken by: Angelina Ok RN,  January 29, 2009 10:10 AM  Follow-up for Phone Call        I just refilled this last week.   Follow-up by: Joaquin Courts  MD,  January 29, 2009 10:12 AM  Additional Follow-up for Phone Call Additional follow up Details #1::        Rx faxed to pharmacy.  Message to pharmacy. Additional Follow-up by: Angelina Ok RN,  January 29, 2009 1:42 PM

## 2011-01-23 NOTE — Medication Information (Signed)
Summary: [employer]   Imported By: Florinda Marker 05/24/2007 10:32:23  _____________________________________________________________________  External Attachment:    Type:   Image     Comment:   External Document

## 2011-01-23 NOTE — Letter (Signed)
Summary: Handout Printed  Printed Handout:  - *Patient Instructions 

## 2011-01-23 NOTE — Progress Notes (Signed)
Summary: refill/gg  Phone Note Refill Request  on July 12, 2007 10:25 AM  Refills Requested: Medication #1:  VALIUM 2 MG TABS Take 1 tablet by mouth two times a day as needed anxiety   Last Refilled: 06/12/2007 Initial call taken by: Merrie Roof RN,  July 12, 2007 10:25 AM  Follow-up for Phone Call        Refill approved-nurse to complete, but pt needs an appt immediately to discuss this medication refill.  She missed her appt today, and this medication was to be discussed at today's appt.   Follow-up by: Joaquin Courts  MD,  July 12, 2007 10:59 AM  Additional Follow-up for Phone Call Additional follow up Details #1::        Rx faxed to pharmacy Additional Follow-up by: Merrie Roof RN,  July 12, 2007 11:34 AM     Prescriptions: VALIUM 2 MG TABS (DIAZEPAM) Take 1 tablet by mouth two times a day as needed anxiety  #60 x 0   Entered and Authorized by:   Joaquin Courts  MD   Signed by:   Joaquin Courts  MD on 07/12/2007   Method used:   Telephoned to ...       [facility]       [address] [place], [place]  [address]  [place]       Ph: [phone]       Fax: [phone]   RxID:   [id number]

## 2011-01-23 NOTE — Progress Notes (Signed)
Summary: REQUESTING HER clonazepam  Phone Note Call from Patient Call back at [phone]   Reason for Call: Talk to Nurse Summary of Call: [name] SAYS THAT Catherine [name] WAS TO CALL IN HER KLONOPIN TO DAY TO [facility] ON [address]. Initial call taken by: Leodis Rains,  December 30, 2010 11:45 AM  Follow-up for Phone Call        Pt. requesting refill Clonazepam, would like to know if she could get 3 mo. supply so she doesn't have to call every mo. Last filled 11/28/10 # 30. Gaylyn Cheers RN  December 30, 2010 12:52 PM   Additional Follow-up for Phone Call Additional follow up Details #1::        rx printed - shiela to fax to pharmacy pt will continue to get a 30 days supply partly because this is how i dispense controlled meds and the other reason is that she "lost" her meds a few months ago and in cases like that meds are not replaced she just had to wait until her next refill. it gives some control over the amount of meds pt have access to at one time Additional Follow-up by: Lehman Prom FNP,  December 30, 2010 6:49 PM    Additional Follow-up for Phone Call Additional follow up Details #2::    Pt. notified. Follow-up by: Gaylyn Cheers RN,  December 31, 2010 12:55 PM  Prescriptions: CLONAZEPAM 1 MG TABS (CLONAZEPAM) One tablet by mouth daily as needed for nerves  #30 x 0   Entered and Authorized by:   Lehman Prom FNP   Signed by:   Lehman Prom FNP on 12/30/2010   Method used:   Printed then faxed to ...       [facility] [address] [phone]* (retail)       [address]       [place], [place]  [address]       Ph: [phone]       Fax: [phone]   RxID:   [id number]

## 2011-01-23 NOTE — Progress Notes (Signed)
Summary: phone note  Phone Note Outgoing Call   Call placed by: Joaquin Courts  MD,  November 27, 2009 8:23 AM Call placed to: Patient Summary of Call: I called pt about her lab results and she is upset that darvocet is off the market.  She continues to have HA's of unclear etiology.  It could be migraines vs tension vs other CNS pathology and given her abnormal ESR and MRI results from 2004, will plan on repeating MRI and getting MRA.  I have asked that she makes sure she makes it to this and to our appt next week to go over the results.  Will also plan on switching darvocet to ultram.  I'm hesitant to use triptans and ergotamines b/c of the possibility of CAD on her cath.  Will try ultram and f/u in 1 week.  She has no prior hx of sz's.   Initial call taken by: Joaquin Courts  MD,  November 27, 2009 8:31 AM  Follow-up for Phone Call        Rx called to [facility] pharmacy. Follow-up by: Chinita Pester RN,  November 27, 2009 9:31 AM    New/Updated Medications: ULTRAM 50 MG TABS (TRAMADOL HCL) One tab by mouth every 8 hours as needed for pain. Prescriptions: ULTRAM 50 MG TABS (TRAMADOL HCL) One tab by mouth every 8 hours as needed for pain.  #21 x 0   Entered and Authorized by:   Joaquin Courts  MD   Signed by:   Joaquin Courts  MD on 11/27/2009   Method used:   Telephoned to ...       [facility] (retail)       [address] [place], [place]  [address]       Ph: [phone]       Fax: [phone]   RxID:   [id number]

## 2011-01-23 NOTE — Miscellaneous (Signed)
Summary: [employer]  [employer]   Imported By: Florinda Marker 01/03/2010 14:28:05  _____________________________________________________________________  External Attachment:    Type:   Image     Comment:   External Document

## 2011-01-23 NOTE — Progress Notes (Signed)
Summary: refill/ hla  Phone Note Refill Request Message from:  Patient on March 22, 2007 4:44 PM  Refills Requested: Medication #1:  VALIUM 2 MG TABS Take 1 tablet by mouth two times a day as needed anxiety Initial call taken by: Marin Roberts RN,  March 22, 2007 4:45 PM  Follow-up for Phone Call        Refill approved-nurse to complete Follow-up by: Ulyess Mort MD,  March 22, 2007 5:04 PM  Additional Follow-up for Phone Call Additional follow up Details #1::        Rx faxed to pharmacy Additional Follow-up by: Merrie Roof RN,  March 25, 2007 4:23 PM    Prescriptions: VALIUM 2 MG TABS (DIAZEPAM) Take 1 tablet by mouth two times a day as needed anxiety  #20 x 0   Entered and Authorized by:   Ulyess Mort MD   Signed by:   Ulyess Mort MD on 03/22/2007   Method used:   Telephoned to ...       [facility]       [address] [place], [place]  [address]  [place]       Ph: [phone]       Fax: [phone]   RxID:   [id number]

## 2011-01-23 NOTE — Progress Notes (Signed)
Summary: clonazepam/ hla  Phone Note Call from Patient   Summary of Call: pt is very upset about her clonazepam, she states she takes and she is tired of dr Andrey Campanile treating her badly and wants to file a complaint, it is reinterated that bshe would be best suited by being seen at mental health, she is not happy and is transferred to jim shaw Initial call taken by: Marin Roberts RN,  December 18, 2009 11:19 AM  Follow-up for Phone Call        When I spoke with her earlier, she was most upset about me not refilling her clonazepam and I explained why I would not continue to refill this medication.  I did recommend her go to Mental Health and I appreciate you reinforcing this suggestion.  Thank you for speaking with her. Follow-up by: Joaquin Courts  MD,  December 18, 2009 7:36 PM

## 2011-01-23 NOTE — Miscellaneous (Signed)
  Clinical Lists Changes D/Ced from hospital on 3/11. Admitted for chest pain. No acute even. Cath with non-stentable lesion of LAD. Medical management with F/U with Dr. Nadara Eaton in 2-3 weeks will need to be arranged. Meds updated.  Medications: Removed medication of LEXAPRO 10 MG TABS (ESCITALOPRAM OXALATE) Take 1 tablet by mouth once a day - Signed Removed medication of MAXZIDE-25 37.5-25 MG TABS (TRIAMTERENE-HCTZ) Take 1 tablet by mouth once a day - Signed Added new medication of LISINOPRIL 10 MG TABS (LISINOPRIL) Take 1 tablet by mouth once a day - Signed Added new medication of COREG 3.125 MG TABS (CARVEDILOL) Take 1 tablet by mouth two times a day - Signed Added new medication of ZOCOR 40 MG TABS (SIMVASTATIN) Take 1 tablet by mouth once a day - Signed Added new medication of EFFEXOR 75 MG TABS (VENLAFAXINE HCL) Take 1 tablet by mouth once a day - Signed Rx of LISINOPRIL 10 MG TABS (LISINOPRIL) Take 1 tablet by mouth once a day;  #30 x 3;  Signed;  Entered by: Julaine Fusi  DO;  Authorized by: Julaine Fusi  DO;  Method used: Print then Give to Patient Rx of COREG 3.125 MG TABS (CARVEDILOL) Take 1 tablet by mouth two times a day;  #60 x 3;  Signed;  Entered by: Julaine Fusi  DO;  Authorized by: Julaine Fusi  DO;  Method used: Print then Give to Patient Rx of ZOCOR 40 MG TABS (SIMVASTATIN) Take 1 tablet by mouth once a day;  #30 x 3;  Signed;  Entered by: Julaine Fusi  DO;  Authorized by: Julaine Fusi  DO;  Method used: Print then Give to Patient Rx of EFFEXOR 75 MG TABS (VENLAFAXINE HCL) Take 1 tablet by mouth once a day;  #30 x 3;  Signed;  Entered by: Julaine Fusi  DO;  Authorized by: Julaine Fusi  DO;  Method used: Print then Give to Patient Rx of VALIUM 2 MG TABS (DIAZEPAM) Take 1 tablet by mouth two times a day as needed anxiety;  #20 x 0;  Signed;  Entered by: Julaine Fusi  DO;  Authorized by: Julaine Fusi  DO;  Method used: Print then Give to Patient

## 2011-01-23 NOTE — Miscellaneous (Signed)
Summary: Social Work Referral  20 minutes.  Contacted patient by phone.  Patient had worked for [employer] for 14 years and was laid off.   Makes around $150 a week unemployment. $180 in Foodstamps.  Has son and grandson in the picture.  Her rent is around $550 and she doesn't have it.  Exploring possibility of going back to [employer] but there were issues with her boss.   Reports has much anxiety about her financial situation. Needs help with rent. Wants to explore disability.  Somewhat interested in job resources.   Has already tried [employer]. and [employer] but was not able or willing to supply certain receipts and papework.   Resource referral:  Information given for [employer], [employer].   Sending brochure for Drue Stager so she can explore disability although she states she is wanting and willing to work.  I've also suggested she contact [facility] for evaluation regarding employability.  Gave phone number and encouraged her to attend group orientation.   Told pt. that if I learned of any other resources that would help with rent, I will call her.     Appended Document: Social Work Referral Mailed brochure, Drue Stager, disability rep.

## 2011-01-23 NOTE — Letter (Signed)
Summary: Generic Letter  [facility]  [address] [place], [place] [address]   Phone: [phone]  Fax: [phone]    10/24/2010  Cherae Huang [address] [place], [place]  [address]  Dear Ms. Walkins,  Please call our office, we have been unable to reach you by phone. All the numbers we have listed for you are not working.      Sincerely,   Gaylyn Cheers RN

## 2011-01-23 NOTE — Miscellaneous (Signed)
Summary: [facility]   Imported By: Florinda Marker 02/27/2010 10:50:47  _____________________________________________________________________  External Attachment:    Type:   Image     Comment:   External Document

## 2011-01-23 NOTE — Progress Notes (Signed)
Summary: Social Work   Nurse, children's placed by: Social Work Call placed to: Patient Summary of Call: Call to patient to arrange for social work appmt.  Patient adamantly not interested in any mental health services whatsover. Became angry at the mere mention of mental health. Not interested in SW services at this time.  I gave her my name, phone number and hours and left the door open if she needs to see me.   I did tell her I would send some educational materials re: "stress" and  she was fine with that.   Otherwise, could not assess at all.           Appended Document: Social Work  Mailed patient education re: anxiety and depression.

## 2011-01-23 NOTE — Progress Notes (Signed)
Summary: Refill on pain med  Phone Note Call from Patient   Caller: Patient Call For: Catherine Courts  MD Summary of Call: Pt left message on voicemail that she had a bad headache and could not come in for an appointment.  She wanted to know if she could get a refill o her pain medicine. Angelina Ok RN  November 30, 2008 10:09 AM  Initial call taken by: Angelina Ok RN,  November 30, 2008 10:09 AM  Follow-up for Phone Call        Refill denied.  Please see previous phone notes but in summary, narcotics are not the tx of her HA's. Thank you. Follow-up by: Catherine Courts  MD,  November 30, 2008 11:44 AM  Additional Follow-up for Phone Call Additional follow up Details #1::        Call to pt to inform her of the denial.  Pt says that her left side feels like it is locking.  Has noted this since starting the Prednisone.  Has soreness as well on the left side.  Has really bad headaches and wants to come off some of the other medications she is taking.  She also said that she has prolems swallowing because of the soreness and problems with the left side.  Pt speech was clear.  Plans to talk with Dr. Andrey Campanile about what is going on at her visit next week. Additional Follow-up by: Angelina Ok RN,  November 30, 2008 2:20 PM    Additional Follow-up for Phone Call Additional follow up Details #2::    Thank you. Follow-up by: Catherine Courts  MD,  November 30, 2008 3:57 PM

## 2011-01-23 NOTE — Progress Notes (Signed)
Summary: Call for HA - requiring Darvocet refill.  Phone Note Call from Patient   Caller: Patient Complaint: Headache Summary of Call: Pt called that she has a HA and Tylenol not helping. She would like me to refill her Darvocet. I read Dr. Tawana Scale note that pt. should not get narcotics until she sees the pt in clinic. She also denied her call for same reason on 12/10 even when confronted to the documented note.I also told pt. that she missed 3 (last) clinic appts since Oct. 2009, made specifically to discuss her HA and poss. txs. She asked me why she would need an appt. for refilling the Darvocet and I informed her that this is because she may benefit from further investigation or alternative tx's for her HAs. I then explained to her that I  recommend her to take 2 more Tylenol tabs (she apparently cannot take Ibuprofen b/c stomach pain), to lay down in a quiet, dark room and to try to sleep it off. Alternatively, if the pain gets really bad, she can come to the ED.  At the beginning of the visit, pt. was not exposing her reason of the call. When I asked her, she seemed offended. When she found out that I am not going to refill her Rx, she started to tell me that I was rude to her. I appologized that it sounded that way, but that was never my intention and I asked her the reason for her call only because she was not initiating any discussion. She then told me that she would like a "one-on-one" d/w Dr. Andrey Campanile ASAP. She threatened that she will take [place] to court as we mistreat her pain. Will let Dr. Andrey Campanile know about the discussion with pt. tonight. Initial call taken by: Carlus Pavlov MD,  December 15, 2008 10:36 PM

## 2011-01-23 NOTE — Progress Notes (Signed)
Summary: refill/gg *  Phone Note Refill Request  on August 29, 2008 11:38 AM  Refills Requested: Medication #1:  VALIUM 2 MG TABS Take 1 tablet by mouth two times a day as needed anxiety Pt has been taking 3 - 4 valium a day and is out of her Rx.  Can she have an early refill?  She was just terminated from her job. [phone]  Initial call taken by: Merrie Roof RN,  August 29, 2008 11:40 AM  Follow-up for Phone Call        Refill denied.  We have discussed this and I agreed to refill at her current dose but have told her that if she had worsening in her symptoms and was needing more, she would need an appointment to be evaluated.  Please have her make an appointment to be seen.  Thank you.   Follow-up by: Joaquin Courts  MD,  August 30, 2008 1:05 PM  Additional Follow-up for Phone Call Additional follow up Details #1::        i spoke w/ ms [place] when she came for labs, she agreed to make an appt and to come to it, it will be october, she is upset because it cannot be refilled until 9/19 but understands why Additional Follow-up by: Marin Roberts RN,  August 30, 2008 2:13 PM      Appended Document: refill/gg * I will refill her current dose early to last through her next appt with me on 9/24.  I do not want her to withdrawl, but unlikely given low dose.  We will address her anxiety issues at her next visit.  Please make sure she knows she will not get any more until she sees me.  Thank you.   Clinical Lists Changes  Medications: Rx of VALIUM 2 MG TABS (DIAZEPAM) Take 1 tablet by mouth two times a day as needed anxiety;  #36 x 0;  Signed;  Entered by: Joaquin Courts  MD;  Authorized by: Joaquin Courts  MD;  Method used: Telephoned to [facility] [address], [place], [place]  [address], Ph: [phone], Fax: [phone]    Prescriptions: VALIUM 2 MG TABS (DIAZEPAM) Take 1 tablet by mouth two times a day as needed anxiety  #36 x 0   Entered and Authorized by:    Joaquin Courts  MD   Signed by:   Joaquin Courts  MD on 08/30/2008   Method used:   Telephoned to ...       [address] (retail)       [address]       [place], [place]  [address]       Ph: [phone]       Fax: [phone]   RxID:   [id number]

## 2011-01-23 NOTE — Consult Note (Signed)
Summary: [facility]  [facility]   Imported By: Louretta Parma 07/18/2010 13:51:26  _____________________________________________________________________  External Attachment:    Type:   Image     Comment:   External Document

## 2011-01-23 NOTE — Progress Notes (Signed)
Summary: request to change meds/hla  ---- Converted from flag ---- ---- 03/08/2009 11:56 AM, Joaquin Courts  MD wrote: Please let her know that I am not going to refill anything stronger.  She needs to come in for an appt.  She no-showed to her 14th appt in the last year last week. I would be happy to discuss this with her in person.  Thanks!  [name]  ---- 03/07/2009 2:23 PM, Marin Roberts RN wrote: pt called requesting something stronger for her nerves. ph# [phone]  thanks,Sarajane Fambrough ------------------------------

## 2011-01-23 NOTE — Assessment & Plan Note (Signed)
Summary: EST-HEADACHES AND REQUESTING REFILL ON  MEDS/CH   Vital Signs:  Patient Profile:   55 Years Old Female Height:     63 inches (160.02 cm) Weight:      185.8 pounds (84.45 kg) BMI:     33.03 Temp:     97.9 degrees F (36.61 degrees C) oral Pulse rate:   71 / minute BP sitting:   145 / 87  (right arm)  Pt. in pain?   no  Vitals Entered By: Krystal Eaton Duncan Dull) (March 23, 2008 1:57 PM)              Is Patient Diabetic? No Nutritional Status BMI of > 30 = obese  Have you ever been in a relationship where you felt threatened, hurt or afraid?No   Does patient need assistance? Functional Status Self care Ambulation Normal     PCP:  Joaquin Courts  MD  Chief Complaint:  c/o recurrent HAs pt thinks may be 2/2 stressors wants to try someting different than valium and lab results from 3/30.  History of Present Illness: Pt is a 55 yo female w/ PMHx: CAD: S/p cardiac catherization by Dr.Ganji(3/08)          Mild LAD stenosis (60%)          Ef 50-55%          Medical mangement and repeat cath in 12/08 by Dr.Solomon: essentially normal coronary           arteries with a possible 20% mid left anterior descending lesion versus           a myocardial bridge(note that this is per the hospital chart).  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  She notes frequent crying spells, and endorses feelings of depression, anhedonia, poor appetite and insominia.  She denies SI/HI and feelings of guilt.  She notes her nerves have also been bothering her more. She has never seen a psychiatrist or a councelor.  Right before leaving, she c/o white vaginal d/c, dysuria, and vaginal itching and burning over the last 2 days.  No new sexual partners.        Updated Prior  Medication List: COREG 6.25 MG TABS (CARVEDILOL) Take 1 tablet by mouth two times a day BENAZEPRIL HCL 40 MG TABS (BENAZEPRIL HCL) Take 1 tablet by mouth once a day for blood pressure. Take this after finishing the Lisinopril ANTIVERT 12.5 MG TABS (MECLIZINE HCL) 1-2 tablets every 4 hours as needed for dizziness VALIUM 2 MG TABS (DIAZEPAM) Take 1 tablet by mouth two times a day as needed anxiety HYDROCHLOROTHIAZIDE 25 MG TABS (HYDROCHLOROTHIAZIDE) Take 1 tablet by mouth once a day PRAVACHOL 10 MG  TABS (PRAVASTATIN SODIUM) Take 1 tablet by mouth once a day before bed. IBUPROFEN 800 MG  TABS (IBUPROFEN) Take one tablet twice a day as needed for pain. DARVOCET A500 100-500 MG  TABS (PROPOXYPHENE N-APAP) take one tab by mouth once every 6 hours as needed for headache PAROXETINE HCL 25 MG  TB24 (PAROXETINE HCL) Take one tablet every morning.  Current Allergies (reviewed today): No known allergies   Past Medical History:    Reviewed history from 10/19/2007 and no changes required:       CAD: S/p cardiac catherization by Dr.Ganji(3/08)  Mild LAD stenosis (60%)                Ef 50-55%                Medical mangement              w/ repeat cath in 12/08 by Dr. Lynnea Ferrier: essentially normal coronary                arteries with a possible 20% mid left anterior descending lesion versus               a myocardial bridge. A f/u myoview as an outpt was to be done which she did have done.         Hypertension       White matter disease, brainstem auditory evoked response test normal 01/2004       Dental caries with gingivitis       BPPV       Chronic dizziness, evaluated by neuro       Insomnia       Generalized anxiety disorder with hx of Valium dependence  Past Surgical History:    Reviewed history from 05/11/2007 and no changes required:       Tubal ligation   Family History:    Reviewed history from 10/19/2007 and no changes required:       Mother, son, and brother with  htn.         No family hx of MI, DM, or breast cancer.    Social History:    Reviewed history from 10/19/2007 and no changes required:       Never smoked, no alcohol, divorced.     Risk Factors: Tobacco use:  never Alcohol use:  no Exercise:  no Seatbelt use:  100 %  PAP Smear History:    Date of Last PAP Smear:  07/08/2007   Review of Systems       As per HPI.     Physical Exam  General:     Alert, no distress, depressed affect. Eyes:     PERRL, anicteric. Mouth:     MMM, poor dentition. Lungs:     normal respiratory effort, normal breath sounds, no crackles, and no wheezes.   Heart:     normal rate, regular rhythm, no murmur, no gallop, no rub, and no JVD.   Abdomen:     soft and normal bowel sounds, soft, non-tender, and normal bowel sounds.   Extremities:     no edema    Impression & Recommendations:  Problem # 1:  ANXIETY DISORDER, GENERALIZED (ICD-300.02) It it clear that she struggles w/ depression and anxiety.  I have many times suggested she see a psychiatrist and she hasn't been interested in seeing anyone or taking any medications, other than valium.   Today she is more open minded to starting a new medication since her crying spells are getting more frequent.  We will start an SSRI and I informed her that it would take several weeks for this to take effect.  I will see her back in 2 months to reassess.  I will also continue to suggest outpt psychiatric counceling at that time. Will continue valium as long as she doens't requre escalating doses.    Her updated medication list for this problem includes:    Valium 2 Mg Tabs (Diazepam) .Marland Kitchen... Take 1 tablet by mouth two times a day as needed anxiety    Paroxetine Hcl 25 Mg  Tb24 (Paroxetine hcl) .Marland Kitchen... Take one tablet every morning.   Problem # 2:  DYSURIA (ICD-788.1) Will send off UA and cx.  Will tx empirically for yeast b/c her symptoms of vaginal itching/burning with white d/c are very c/w a yeast  infection.  Will  have her RTC sooner if her symptoms don't resolve within a few days for a full pelvic exam.    Orders: T-Urinalysis ([address]) T-Culture, Urine ([id number])   Problem # 3:  HYPOKALEMIA (ICD-276.8) Pt has had multiple ER visits over the last six months and many times she is hypokalemic.  Her most recent K last week was 3.5 after she had been on K supplementation.  Will added K sparing diuretic to her BP regimen to see if that helps.  Will recheck BMET at her next visit to evaluate for hypokalemia.    Problem # 4:  HYPERTENSION (ICD-401.9) BP today above goal but seems anxious.  Previously at goal.  Will f/u in 2 months and consider adjusting medications if it remains elevated.   Her updated medication list for this problem includes:    Coreg 6.25 Mg Tabs (Carvedilol) .Marland Kitchen... Take 1 tablet by mouth two times a day    Benazepril Hcl 40 Mg Tabs (Benazepril hcl) .Marland Kitchen... Take 1 tablet by mouth once a day for blood pressure. take this after finishing the lisinopril    Maxzide-25 37.5-25 Mg Tabs (Triamterene-hctz) .Marland Kitchen... Take one tablet by mouth daily.  BP today: 145/87 Prior BP: 120/81 (01/07/2008)  Labs Reviewed: Creat: 0.98 (03/20/2008) Chol: 220 (10/19/2007)   HDL: 40 (10/19/2007)   LDL: 113 (10/19/2007)   TG: 334 (10/19/2007)   Problem # 5:  HYPERLIPIDEMIA (ICD-272.4) Will recheck her cholesterol at her next visit and likely increase to 40 mg daily.  We started off low b/c she seems to be very sensitive to medications.    Her updated medication list for this problem includes:    Pravachol 10 Mg Tabs (Pravastatin sodium) .Marland Kitchen... Take 1 tablet by mouth once a day before bed.   Problem # 6:  GYNECOLOGICAL EXAMINATION, ROUTINE (ICD-V72.31) Will repeat pap in 07/09.  Will also discuss mammogram and colonoscopy at her next visit.    Complete Medication List: 1)  Coreg 6.25 Mg Tabs (Carvedilol) .... Take 1 tablet by mouth two times a day 2)  Benazepril Hcl 40 Mg Tabs  (Benazepril hcl) .... Take 1 tablet by mouth once a day for blood pressure. take this after finishing the lisinopril 3)  Antivert 12.5 Mg Tabs (Meclizine hcl) .Marland Kitchen.. 1-2 tablets every 4 hours as needed for dizziness 4)  Valium 2 Mg Tabs (Diazepam) .... Take 1 tablet by mouth two times a day as needed anxiety 5)  Maxzide-25 37.5-25 Mg Tabs (Triamterene-hctz) .... Take one tablet by mouth daily. 6)  Pravachol 10 Mg Tabs (Pravastatin sodium) .... Take 1 tablet by mouth once a day before bed. 7)  Ibuprofen 800 Mg Tabs (Ibuprofen) .... Take one tablet twice a day as needed for pain. 8)  Darvocet A500 100-500 Mg Tabs (Propoxyphene n-apap) .... Take one tab by mouth once every 6 hours as needed for headache 9)  Paroxetine Hcl 25 Mg Tb24 (Paroxetine hcl) .... Take one tablet every morning. 10)  Fluconazole 150 Mg Tabs (Fluconazole) .... Take one tablet by mouth.   Patient Instructions: 1)  Please make a followup appointment in 2 months to have your blood pressure and potassium checked.   2)  Please take your new prescriptions.    Prescriptions:  FLUCONAZOLE 150 MG  TABS (FLUCONAZOLE) Take one tablet by mouth.  #1 x 0   Entered and Authorized by:   Joaquin Courts  MD   Signed by:   Joaquin Courts  MD on 03/23/2008   Method used:   Electronically sent to ...       [address]*       [address]       [place], [place]  [address]       Ph: [phone]       Fax: [phone]   RxID:   [id number] MAXZIDE-25 37.5-25 MG  TABS (TRIAMTERENE-HCTZ) Take one tablet by mouth daily.  #30 x 2   Entered and Authorized by:   Joaquin Courts  MD   Signed by:   Joaquin Courts  MD on 03/23/2008   Method used:   Electronically sent to ...       [address]       [address]       [place], [place]  [address]       Ph: [phone]       Fax: [phone]   RxID:   [id number] PAROXETINE HCL 25 MG  TB24 (PAROXETINE HCL) Take one tablet every morning.  #32 x 3   Entered and Authorized by:    Joaquin Courts  MD   Signed by:   Joaquin Courts  MD on 03/23/2008   Method used:   Electronically sent to ...       [address]*       [address]       [place], [place]  [address]       Ph: [phone]       Fax: [phone]   RxID:   [id number]  ] Laboratory Results   Urine Tests  Date/Time Received: ..................................................................Marland KitchenKrystal Eaton [facility])  March 23, 2008 3:46 PM   Routine Urinalysis   Color: yellow Glucose: negative   (Normal Range: Negative) Bilirubin: negative   (Normal Range: Negative) Ketone: negative   (Normal Range: Negative) Spec. Gravity: <1.005   (Normal Range: 1.003-1.035) Blood: negative   (Normal Range: Negative) pH: 5.0   (Normal Range: 5.0-8.0) Protein: negative   (Normal Range: Negative) Urobilinogen: 0.2   (Normal Range: 0-1) Nitrite: negative   (Normal Range: Negative) Leukocyte Esterace: trace   (Normal Range: Negative)

## 2011-01-23 NOTE — Progress Notes (Signed)
  Phone Note Call from Patient   Caller: Patient Reason for Call: Acute Illness Action Taken: Phone Call Completed Summary of Call: Patient call complaining of nerve breakdown and also asking regarding why PCP discontinue klonopin. Patient was advised to call clinic in the morning in order to been evaluated and decide best medication regimen for her. patient agrees, but will like for triage nurse to schedule appointment and to call her with details.Marland KitchenMarland Kitchen
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
## 2011-01-23 NOTE — Progress Notes (Signed)
Summary: Refill/gh  Phone Note Refill Request Message from:  Fax from Pharmacy on July 30, 2009 4:43 PM  Refills Requested: Medication #1:  NORVASC 5 MG TABS Take 1 tablet by mouth once a day   Last Refilled: 07/06/2009  Medication #2:  DARVOCET-N 100 100-650 MG TABS Take one tablet by mouth every six hours as needed for pain.   Last Refilled: 06/29/2009  Medication #3:  COREG 6.25 MG TABS Take 1 tablet by mouth two times a day  Medication #4:  OMEPRAZOLE 20 MG CPDR Take 1 tablet by mouth once a day.   Last Refilled: 03/29/2009  Method Requested: Fax to Local Pharmacy Initial call taken by: Angelina Ok RN,  July 30, 2009 4:43 PM  Additional Follow-up for Phone Call Additional follow up Details #1::        Rx faxed to pharmacy Additional Follow-up by: Angelina Ok RN,  August 03, 2009 2:00 PM    Prescriptions: OMEPRAZOLE 20 MG CPDR (OMEPRAZOLE) Take 1 tablet by mouth once a day  #30 x 5   Entered and Authorized by:   Joaquin Courts  MD   Signed by:   Joaquin Courts  MD on 08/01/2009   Method used:   Telephoned to ...       [facility] Department (retail)       [address] [place], [place]  [address]       Ph: [phone]       Fax: [phone]   RxID:   [id number] NORVASC 5 MG TABS (AMLODIPINE BESYLATE) Take 1 tablet by mouth once a day  #30 x 5   Entered and Authorized by:   Joaquin Courts  MD   Signed by:   Joaquin Courts  MD on 08/01/2009   Method used:   Telephoned to ...       [facility] Department (retail)       [address] [place], [place]  [address]       Ph: [phone]       Fax: [phone]   RxID:   [id number] COREG 6.25 MG TABS (CARVEDILOL) Take 1 tablet by mouth two times a day  #60 x 5   Entered and Authorized by:   Joaquin Courts  MD   Signed by:   Joaquin Courts  MD on 08/01/2009   Method used:   Telephoned to ...       [facility] Department (retail)       [address]  [place], [place]  [address]       Ph: [phone]       Fax: [phone]   RxID:   [id number]

## 2011-01-23 NOTE — Progress Notes (Signed)
Summary: [facility].   Phone Note Other Incoming   Summary of Call: Diane called from [facility] and said they were filling three RX's including omeprazole that was never picked up in April.   They could not fill the Norvasc as they don't carry that any longer however patient can go to MAP and start that process.   $15 was dropped off to [facility] in payment.  Diane said that they told patient about MAP and to pursue eligibility for that program.

## 2011-01-23 NOTE — Progress Notes (Signed)
Summary: refill/gg  Phone Note Refill Request  on October 23, 2008 4:24 PM  Refills Requested: Medication #1:  KLONOPIN 0.5 MG TABS Take 1 tablet by mouth two times a day  Method Requested: Telephone to Pharmacy Initial call taken by: Merrie Roof RN,  October 23, 2008 4:24 PM  Follow-up for Phone Call        Refill approved, nurse to complete.  However, please remind her she is overdue for an appointment.  Thank you. Follow-up by: Joaquin Courts  MD,  October 24, 2008 9:56 AM  Additional Follow-up for Phone Call Additional follow up Details #1::        Rx called to pharmacy Additional Follow-up by: Merrie Roof RN,  October 24, 2008 11:39 AM      Prescriptions: KLONOPIN 0.5 MG TABS (CLONAZEPAM) Take 1 tablet by mouth two times a day  #60 x 3   Entered and Authorized by:   Joaquin Courts  MD   Signed by:   Joaquin Courts  MD on 10/24/2008   Method used:   Telephoned to ...       [address] (retail)       [address]       [place], [place]  [address]       Ph: [phone]       Fax: [phone]   RxID:   [id number]
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
## 2011-01-23 NOTE — Progress Notes (Signed)
Summary: Office Visit//DEPRESSION SCREENING  Office Visit//DEPRESSION SCREENING   Imported By: Arta Bruce 09/26/2010 15:49:00  _____________________________________________________________________  External Attachment:    Type:   Image     Comment:   External Document

## 2011-01-23 NOTE — Progress Notes (Signed)
Summary: Vaginal itching  Phone Note Call from Patient   Caller: Patient Call For: Joaquin Courts  MD Summary of Call: Call from pt. states she is having some mild itching no discharge vaginally.  No odor.  Wants something called in for says she cannot come in for an appointment until Monday.  Medication can be sent to [facility], [employer]. Initial call taken by: Angelina Ok RN,  July 18, 2008 2:20 PM  Follow-up for Phone Call        Since I do not know what is causing the complaint, I cannot call in anything. Follow-up by: Ulyess Mort MD,  July 18, 2008 4:16 PM  Additional Follow-up for Phone Call Additional follow up Details #1::        Pt to be scheduled for an appointment to come in. Additional Follow-up by: Angelina Ok RN,  July 18, 2008 4:42 PM

## 2011-01-23 NOTE — Progress Notes (Signed)
Summary: appt/ hla  Phone Note Call from Patient   Reason for Call: Acute Illness Summary of Call: call to c/o treatment over phone by oncall md, states md accused her of drug seeking behavior and laughed at her, i informed her she would need an appt that she would keep to address her h/a's and her c/o md. she ask what i was going to do about the md and i informed her there was nothing i could do except schedule an appt or refer her to someone else, she said everytime she called needing help we did nothing, i noted the cancelled and no show appts and noted that pt needed to keep appts. i have now scheduled her an appt Initial call taken by: Marin Roberts RN,  December 25, 2008 10:11 AM  Follow-up for Phone Call        Thank you.  I will see her today and address her HA's and pain medications. Follow-up by: Joaquin Courts  MD,  December 25, 2008 10:20 AM

## 2011-01-23 NOTE — Progress Notes (Signed)
Summary: NEED SPEAK WITH YOU.  Phone Note Call from Patient   Summary of Call: PLEASE CALL PATIENT TO [phone] Initial call taken by: Domenic Polite,  October 29, 2010 3:01 PM  Follow-up for Phone Call        No answer.  Dutch Quint RN  October 29, 2010 4:03 PM  In office.  Dutch Quint RN  October 30, 2010 9:24 AM

## 2011-01-23 NOTE — Miscellaneous (Signed)
Summary: Orders Update  Clinical Lists Changes  Orders: Added new Service order of Est. Patient Level III (99213) - Signed 

## 2011-01-23 NOTE — Progress Notes (Signed)
Summary: PT CALL TO LET YOU KNOW SHE CAN'T HER MEDICINE  Phone Note Call from Patient   Summary of Call: orange car exp 11-07-10 they won't  give her  medicine till she cant get her orange card please call her at  [phone] Initial call taken by: Domenic Polite,  November 29, 2010 1:06 PM  Follow-up for Phone Call        Pt was just HERE yesterday for a visit #1 - was her orange card checked yesterday for an expiration date because that was not brought to my attention that she was being seen with an expired orange card. I keep saying that is very important to know because that deterimes the way pts get their meds. I instructed her to go go the pharmacy to get meds (not knowing her card was expired) If her orange card is expired then she can't use [place] pharmacy, especially since she most likely does not even has an eligibilty appt.  It looks like when she claimed her meds were stolen last month that 1 month worth of rx was sent to [facility] - pt can get from there but of course cost is an issue. I would suggest she try to get an orange card next week from social services so that she can get med from [facility] pharmacy Follow-up by: Lehman Prom FNP,  November 29, 2010 1:15 PM  Additional Follow-up for Phone Call Additional follow up Details #1::        Pt. does not want her meds sent to [facility] does not have any money right now. She will try to get an eligibility appt. ASAP advised she could go to [facility] or walk in @ [facility] if someone is a no show. Additional Follow-up by: Gaylyn Cheers RN,  November 29, 2010 1:25 PM

## 2011-01-23 NOTE — Progress Notes (Signed)
Summary: Needs Potassium  Phone Note Outgoing Call   Call placed by: Angelina Ok RN,  June 15, 2007 3:40 PM Call placed to: Patient Summary of Call: Attempts to reach pt at home and at work.  Pt unavailable.  Letter mailed to pt to notify of need to contact Clinics ASAP.   Initial call taken by: Angelina Ok RN,  June 15, 2007 3:41 PM

## 2011-01-23 NOTE — Assessment & Plan Note (Signed)
Summary: TO DISCUSS HER MEDS/CFB   Vital Signs:  Patient Profile:   55 Years Old Female Height:     62 inches (157.48 cm) Weight:      188.3 pounds (85.59 kg) BMI:     34.57 Temp:     97.9 degrees F (36.61 degrees C) oral Pulse rate:   71 / minute BP supine:   159 / 91  (right arm)  Pt. in pain?   no  Vitals Entered By: Geannie Risen RN (May 11, 2007 2:06 PM)              Is Patient Diabetic? No Nutritional Status BMI of > 30 = obese  Have you ever been in a relationship where you felt threatened, hurt or afraid?No   Does patient need assistance? Functional Status Self care Ambulation Normal   PCP:  Coralie Carpen MD  Chief Complaint:  Discuss meds with MD.  History of Present Illness: Catherine [name] is a 55y/o AAW presenting today for a hospital follow up.  She is s/p cardiac catherization for CP found to have mild LAD stenosis (60%) with small plaque rupture and an EF of 50-55%. She is being  treated with medical management. She was d/c'd on 3/11  On 3/18 she returned to the ER for c/o pounding H/A. Her BP was 147/86. She recieved a Lortab. She returned on 3/24 with new H/A. Her BP was 156/101. After negative CT she was d/c'd home. She recently was seen again in the ER on 5/18 for c/o left neck pain with BP 183/67. At that same visit, she mentioned one episode of VB, which she had not had in >5 yrs. This did not recur. After some Lortab and Valium she was discharged home.   She is also here to discuss treatment for her persistent hotf lashes and persistent anxiety. She has been on Effexor for anxiety/depression for >1 yr, after failing other SSRI therapies. She also notes occasional palpitatoins with some c/o anxiety but doesn't feel these are panic attacks.       Prior Medications :  ANTIVERT 12.5 MG TABS (MECLIZINE HCL) 1-2 tablets every 4 hours as needed for dizziness VALIUM 2 MG TABS (DIAZEPAM) Take 1 tablet by mouth two times a day as needed anxiety LISINOPRIL 10 MG  TABS (LISINOPRIL) Take 1 tablet by mouth once a day COREG 6.25 MG TABS (CARVEDILOL) Take 1 tablet by mouth two times a day ZOCOR 40 MG TABS (SIMVASTATIN) Take 1 tablet by mouth once a day EFFEXOR 75 MG TABS (VENLAFAXINE HCL) Take 1 tablet by mouth once a day    Current Allergies (reviewed today): No known allergies  Updated/Current Medications (including changes made in today's visit):  ANTIVERT 12.5 MG TABS (MECLIZINE HCL) 1-2 tablets every 4 hours as needed for dizziness VALIUM 2 MG TABS (DIAZEPAM) Take 1 tablet by mouth two times a day as needed anxiety LISINOPRIL 10 MG TABS (LISINOPRIL) Take 4 tablet by mouth once a day COREG 6.25 MG TABS (CARVEDILOL) Take 1 tablet by mouth two times a day ZOCOR 40 MG TABS (SIMVASTATIN) Take 1 tablet by mouth once a day EFFEXOR 75 MG TABS (VENLAFAXINE HCL) Take 1 tablet by mouth two times a day for nerves BENAZEPRIL HCL 40 MG TABS (BENAZEPRIL HCL) Take 1 tablet by mouth once a day for blood pressure. 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   Eyes:     PERRLA Mouth:     poor dentition and gingival inflammation.   Neck:     supple.   Lungs:     normal respiratory effort, normal breath sounds, no crackles, and no wheezes.   Heart:     normal rate, regular rhythm, no murmur, no gallop, and no rub.     Abdomen:     soft, non-tender, normal bowel sounds, and no distention.   Pulses:     Bilateral radial pulses strong and equal Extremities:     No edema  Neurologic:     alert & oriented X3 and cranial nerves II-XII intact.   Skin:     no rashes and no petechiae.   Cervical Nodes:     No lymphadenopathy noted Psych:     Oriented X3, memory intact for recent and remote, normally interactive, poor eye contact, and slightly anxious.      Impression & Recommendations:  Problem # 1:  HYPERTENSION (ICD-401.9) After careful consideration and her hx/o hypokalemia induced by HCTZ, we will increase her ACE to 40mg  daily. As she has 10mg  Lisnopril at this time, she will increase to 40mg  and start Benazepril 40mg . In light of her CAD, we'll continue Carvedilol for now. This may be the source of her intermittent headaches.   Her updated medication list for this problem includes:    Lisinopril 10 Mg Tabs (Lisinopril) .Marland Kitchen... Take 4 tablet by mouth once a day    Coreg 6.25 Mg Tabs (Carvedilol) .Marland Kitchen... Take 1 tablet by mouth two times a day    Benazepril Hcl 40 Mg Tabs (Benazepril hcl) .Marland Kitchen... Take 1 tablet by mouth once a day for blood pressure. take this after finishing the lisinopril  Orders: T-Comprehensive Metabolic Panel ([id number]) T-CBC w/Diff ([id number]) T-Urinalysis (81003-65000)  BP today: 159/91 Prior BP: 143/87 (03/10/2007)  Labs Reviewed: Creat: 0.88 (11/30/2006)   Problem # 2:  HYPERLIPIDEMIA (ICD-272.4) Lipids collected in house, LDL116 was TG 158 , HDl 38. This would suggest metabolic syndrome at least. Goal LDL should be <70. This lipid panel was taken before starting on Zocor.  Will recheck lipid profile at f/u appt  Her updated medication list for this problem includes:    Zocor 40 Mg Tabs (Simvastatin) .Marland Kitchen... Take 1 tablet by mouth once a day  Labs Reviewed: SGOT: 21 (11/30/2006)   SGPT: 13 (11/30/2006)  Future Orders: T-Comprehensive Metabolic Panel ([id number])  ... 06/10/2007 T-Lipid Profile [phone]) ... 06/10/2007   Problem # 3:  CORONARY ARTERY DISEASE (ICD-414.00) She did not keep her f/u appt  with Dr. Jacinto Halim. For now we can just continue to risk stratify.  Her updated medication list for this problem includes:    Lisinopril 10 Mg Tabs (Lisinopril) .Marland Kitchen... Take 4 tablet by mouth once a day    Coreg 6.25 Mg Tabs (Carvedilol) .Marland Kitchen... Take 1 tablet by mouth two times a day    Benazepril Hcl 40 Mg Tabs (Benazepril hcl) .Marland Kitchen... Take 1 tablet by mouth once a day for blood pressure. take this after finishing the lisinopril   Problem # 4:  MENOPAUSAL SYNDROME (ICD-627.2) Her CAD prevents any option for HRT. She was given a handout on alternative therapies such as Blaok Cohosh or Soy. She was advised since these products are not FDA approved and limited medical studies we can't be sure which would be the best option. I recommended Black Cohosh since it has the largest amount of positive results.   Problem # 5:  ANXIETY DISORDER, GENERALIZED (ICD-300.02) I think it prudent to increase her dose of Effexor from once daily 75mg  to 75mg  two times a day. She will continue her Valium and was again advised of her need for psychotherapy.   Her updated medication list for this problem includes:    Valium 2 Mg Tabs (Diazepam) .Marland Kitchen... Take 1 tablet by mouth two times a day as needed anxiety    Effexor 75 Mg Tabs (Venlafaxine hcl) .Marland Kitchen... Take 1 tablet by mouth two times a day for nerves   Problem # 6:  PALPITATIONS (ICD-785.1) Her intermittent palplitations should at least be re-evalauted with thyroid fxn.  Her updated medication list for this problem includes:    Coreg 6.25 Mg Tabs (Carvedilol) .Marland Kitchen... Take 1 tablet by mouth two times a day  Orders: T-TSH ([id number]) T-T4, Free ([phone])   Medications Added to Medication List This Visit: 1)  Lisinopril 10 Mg Tabs (Lisinopril) .... Take 4 tablet by mouth once a day 2)  Effexor 75 Mg Tabs (Venlafaxine hcl)  .... Take 1 tablet by mouth two times a day for nerves 3)  Benazepril Hcl 40 Mg Tabs (Benazepril hcl) .... Take 1 tablet by mouth once a day for blood pressure. take this after finishing the lisinopril   Patient Instructions: 1)  Please schedule a follow-up appointment in 1 month.

## 2011-01-23 NOTE — Letter (Signed)
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

## 2011-01-23 NOTE — Progress Notes (Signed)
Summary: refill/gg  Phone Note Refill Request  on October 01, 2009 3:57 PM  Refills Requested: Medication #1:  KLONOPIN 1 MG TABS Take 1 tablet by mouth three times a day  Medication #2:  DARVOCET-N 100 100-650 MG TABS Take one tablet by mouth every six hours as needed for pain.  Method Requested: Telephone to Pharmacy Initial call taken by: Merrie Roof RN,  October 01, 2009 3:58 PM  Follow-up for Phone Call        Please remind her she is overdue for an appointment.  Follow-up by: Joaquin Courts  MD,  October 02, 2009 8:03 AM  Additional Follow-up for Phone Call Additional follow up Details #1::        Rx called to pharmacy Additional Follow-up by: Merrie Roof RN,  October 02, 2009 8:53 AM    Prescriptions: DARVOCET-N 100 100-650 MG TABS (PROPOXYPHENE N-APAP) Take one tablet by mouth every six hours as needed for pain.  #90 x 1   Entered and Authorized by:   Joaquin Courts  MD   Signed by:   Joaquin Courts  MD on 10/02/2009   Method used:   Telephoned to ...       [facility] Department (retail)       [address] [place], [place]  [address]       Ph: [phone]       Fax: [phone]   RxID:   [id number] KLONOPIN 1 MG TABS (CLONAZEPAM) Take 1 tablet by mouth three times a day  #90 x 3   Entered and Authorized by:   Joaquin Courts  MD   Signed by:   Joaquin Courts  MD on 10/02/2009   Method used:   Telephoned to ...       [facility] Department (retail)       [address] [place], [place]  [address]       Ph: [phone]       Fax: [phone]   RxID:   [id number]

## 2011-01-23 NOTE — Progress Notes (Signed)
  Phone Note Call from Patient   Caller: Patient Complaint: Headache Action Taken: Rx Called In, Appt Scheduled Summary of Call: Mrs. Bowler called tonight complaining of a headache. She had been given a limited prescription for darvocet and she took her last pill tonight, which seemed to help with the headache. I will call in #30 of Darvocet N-100 with no refills. She will need to be seen in clinic next week. Initial call taken by: Peggye Pitt MD,  June 26, 2007 8:02 PM  Follow-up for Phone Call        Phone Call Completed, Appt Scheduled I tried to reach the pt by phone [phone].  Number was disconnected.  I also tried work number #[phone] with no success. Sent pt an appt (by mail) for her new PCP Dr. Andrey Campanile on 07/12/07 at 9:45am. Follow-up by: Shon Hough,  June 28, 2007 12:41 PM

## 2011-01-23 NOTE — Assessment & Plan Note (Signed)
Summary: ROUTINE CK/EST/VS   Vital Signs:  Patient Profile:   55 Years Old Female Height:     63 inches (160.02 cm) Weight:      175.3 pounds (79.68 kg) BMI:     31.17 Temp:     96.7 degrees F (35.94 degrees C) oral Pulse rate:   61 / minute BP sitting:   159 / 95  (right arm) Cuff size:   regular  Pt. in pain?   no  Vitals Entered By: Theotis Barrio NT II (September 21, 2008 1:52 PM)              Is Patient Diabetic? No Nutritional Status BMI of > 30 = obese  Have you ever been in a relationship where you felt threatened, hurt or afraid?No   Does patient need assistance? Functional Status Self care Ambulation Normal    SPOKE WITH DR Benton'S OFFICE, TRIED TO RESCHEDULE PATIENT'S APPT.  OFFICE HAS DISCHARGEDTHE PATIENT . THIS IN NOT HER FIRST TIME NOT SHOWIN NOR CALL.  SENDING REFERALL TO [facility] FOR GI APPT. SENDING AS AN URGERNT REFERRAL. [facility] WILL SEE HER, IF NOT , SHE WILL HAVE TO PAY OUT OF POCKET GAVE ]DR Rhet Rorke THIS INFORMATION. LELA STURDIVANT NTII.  PCP:  Joaquin Courts  MD  Chief Complaint:  MEDICATION REFILL / REFUSE FLU SHOT / .  History of Present Illness: Pt is a 55 yo female w/ past medical history of  CAD: S/p cardiac catherization by Dr.Ganji(3/08)          Mild LAD stenosis (60%)          Ef 50-55%          Medical mangement        w/ repeat cath in 12/08 by Dr. Lynnea Ferrier: essentially normal coronary          arteries with a possible 20% mid left anterior descending lesion versus         a myocardial bridge. A f/u myoview as an outpt was to be done which she did have done.   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  She was recently laid off from her  job at [employer] and has been feeling depressed about this.  She was taking more valium than she was supposed to at the timebut notes that since she spoke w/ the triage nurses, she has been taking it as prescribed.  She notes continued occasional crying spells.  She has also had a significant unintentional weight loss.  No SI.  Blood in panties has resolved.  Forgot appointment scheduled for her w/ GI.    Also, has not been taking prednisone for ? of temporal arteritis and did not go to surgeon for temporal artery biopsy b/c she was scared.   Also notes continued L sided HA's and occasional blurry vision that have been ongoing for a long time.    Updated Prior Medication List: She has stopped benazapril and maxide b/c of kidney fxn and never started prednisone.  COREG 6.25 MG TABS (CARVEDILOL) Take 1 tablet by mouth two times a day BENAZEPRIL HCL 40 MG TABS (BENAZEPRIL HCL) Take 1 tablet by mouth once a day for blood pressure. Take this after finishing the Lisinopril ANTIVERT 12.5 MG  TABS (MECLIZINE HCL) 1-2 tablets every 4 hours as needed for dizziness VALIUM 2 MG TABS (DIAZEPAM) Take 1 tablet by mouth two times a day as needed anxiety MAXZIDE-25 37.5-25 MG  TABS (TRIAMTERENE-HCTZ) Take one tablet by mouth daily. PRAVACHOL 10 MG  TABS (PRAVASTATIN SODIUM) Take 1 tablet by mouth once a day before bed. DARVOCET A500 100-500 MG  TABS (PROPOXYPHENE N-APAP) take one tab by mouth once every 6 hours as needed for headache PAXIL 20 MG  TABS (PAROXETINE HCL) Take one tablet by mouth in the morning. NITROSTAT 0.4 MG  SUBL (NITROGLYCERIN) Take one tab under the tongue as needed for severe chest pain.  Do not use more than one a day. PREDNISONE 20 MG  TABS (PREDNISONE) take 2 tablet daily by mouth.  Current Allergies: ! CIPRO  Past Medical History:    Reviewed history from 08/25/2008 and no changes required:       CAD: S/p cardiac catherization by Dr.Ganji(3/08)                Mild LAD stenosis (60%)                 Ef 50-55%                Medical mangement              w/ repeat cath in 12/08 by Dr. Lynnea Ferrier: essentially normal coronary                arteries with a possible 20% mid left anterior descending lesion versus               a myocardial bridge. A f/u myoview as an outpt was to be done which she did have done.         Hypertension       White matter disease, brainstem auditory evoked response test normal 01/2004       Dental caries with gingivitis       BPPV       Chronic dizziness, evaluated by neuro       Insomnia       Generalized anxiety disorder with hx of Valium dependence       FOBT + stools       elevated D dimer with multiple negative CTAs  Past Surgical History:    Reviewed history from 05/11/2007 and no changes required:       Tubal ligation   Social History:    Reviewed history from 10/19/2007 and no changes required:       Never smoked, no alcohol, divorced.  Recently laid off from her job.   Risk Factors: Tobacco use:  never Alcohol use:  no Exercise:  no Seatbelt use:  100 %  PAP Smear History:    Date of Last PAP Smear:  07/20/2008   Review of Systems       As per HPI.   Physical Exam  General:     Alert, oriented, no distress. Head:     Cook/AT, no temporal artery  tenderness. Lungs:     Normal respiratory effort, chest expands symmetrically. Lungs are clear to auscultation, no crackles or wheezes. Heart:     Normal rate and regular rhythm. S1 and S2 normal without gallop, murmur, click, rub or other extra sounds. Abdomen:     +BS's, soft, NT, ND, obese. Psych:     Affect depressed.    Impression & Recommendations:  Problem # 1:  RENAL INSUFFICIENCY, ACUTE (  ICD-585.9) Repeating BMET and checking UA.  Pt has stopped maxide and ACE I.   Orders: T-Basic Metabolic Panel [id number]) T-HIV Antibody  (Reflex) [phone]) T-Urinalysis ([id number])   Problem # 2:  RECTAL BLEEDING (ICD-569.3) Needs f/u w/ GI but missed appt.   Given hx of unintentional weight loss(15 lbs in one year)  and FOBT positive in 05/09, needs scoping.  Will check anemia studies since noted to be anemic on recent blood draws.  May be ABL vs fe def.  Recent B12 and folate done and WNL, so will not repeat at this point.   Orders: T-CBC No Diff ([id number]) T-Iron ([id number]) T-Iron Binding Capacity (TIBC) ([id number]) T-Ferritin ([id number])   Problem # 3:  ANXIETY DISORDER, GENERALIZED (ICD-300.02) Pt's depressive and anxiety symptoms are a huge barrier to her care.  Will change valium to klonpin to get more 24 hour coverage of her anxiety symptom.  Will increase her paxil to see if that may help w/ her depression.  Noted to have anhedonia, unintentional weight loss, depressive symptoms, etc, so definitely this is impinging on her quality of life.  Also, may have a component of adjustment d/o b/c recently lost her job.  Will refer her to Lorri Frederick for assitance w/ finding another job and counceling.  Her updated medication list for this problem includes:    Klonopin 0.5 Mg Tabs (Clonazepam) .Marland Kitchen... Take 1 tablet by mouth two times a day    Paxil 40 Mg Tabs (Paroxetine hcl) .Marland Kitchen... Take 1 tablet by mouth every morning  Orders: Social Work Referral (Social )   Problem # 4:  SYMPTOM, HEADACHE (ICD-784.0) Tx w/ predinsone started in 07/09 b/c of ? TA b/c ESR at 69 but pt never started tx and did not go to her surgery appt for temporal artery biopsy.  Still w/ L sided HA and visual changes and will repeat ESR but strongly recommended making an appt and filling prednisone and discussed some of the potential consequences of not doing this.  Orders: T-Sed Rate (Automated) [phone])  Her updated medication list for this problem includes:    Coreg 6.25 Mg Tabs (Carvedilol) .Marland Kitchen... Take 1 tablet by mouth two times a day    Darvocet A500 100-500 Mg Tabs (Propoxyphene n-apap) .Marland Kitchen... Take one tab by mouth once every 6 hours as needed for  headache   Problem # 5:  HYPERTENSION (ICD-401.9) Up but meds help b/c of renal insufficiency.  Will recheck renal insufficiency and try to restart.  F/u in two weeks.   Her updated medication list for this problem includes:    Coreg 6.25 Mg Tabs (Carvedilol) .Marland Kitchen... Take 1 tablet by mouth two times a day    Benazepril Hcl 40 Mg Tabs (Benazepril hcl) .Marland Kitchen... Take 1 tablet by mouth once a day for blood pressure. take this after finishing the lisinopril    Maxzide-25 37.5-25 Mg Tabs (Triamterene-hctz) .Marland Kitchen... Take one tablet by mouth daily.   Complete Medication List: 1)  Coreg 6.25 Mg Tabs (Carvedilol) .... Take 1 tablet by mouth two times a day 2)  Benazepril Hcl 40 Mg Tabs (Benazepril hcl) .... Take 1 tablet by mouth once a day for blood pressure. take this after finishing the lisinopril 3)  Antivert 12.5 Mg Tabs (Meclizine hcl) .Marland Kitchen.. 1-2 tablets every 4 hours as needed for dizziness 4)  Klonopin 0.5 Mg Tabs (Clonazepam) .... Take 1 tablet by mouth two times a day 5)  Maxzide-25 37.5-25 Mg Tabs (Triamterene-hctz) .... Take one tablet by mouth daily. 6)  Pravachol 10 Mg Tabs (Pravastatin sodium) .... Take 1 tablet by mouth once a day before bed. 7)  Darvocet A500 100-500 Mg Tabs (Propoxyphene n-apap) .... Take one tab by mouth once every 6 hours as needed for headache 8)  Paxil 40 Mg Tabs (Paroxetine hcl) .... Take 1 tablet by mouth every morning 9)  Nitrostat 0.4 Mg Subl (Nitroglycerin) .... Take one tab under the tongue as needed for severe chest pain.  do not use more than one a day. 10)  Prednisone 20 Mg Tabs (Prednisone) .... Take 2 tablet daily by mouth.   Patient Instructions: 1)  Please make a followup appointment in 2 weeks. 2)  Please get your bloodwork done. 3)  Please see the gastroenterologist. 4)  Please see Lorri Frederick. 5)  Please get your new prescriptions filled.  Stop taking valium and start klonopin and increase paxil to 40 mg a day. 6)  Please bring your prescription bottles  with you.   Prescriptions: PAXIL 40 MG TABS (PAROXETINE HCL) Take 1 tablet by mouth every morning  #30 x 3   Entered and Authorized by:   Joaquin Courts  MD   Signed by:   Joaquin Courts  MD on 09/21/2008   Method used:   Print then Give to Patient   RxID:   [id number] KLONOPIN 0.5 MG TABS (CLONAZEPAM) Take 1 tablet by mouth two times a day  #60 x 0   Entered and Authorized by:   Joaquin Courts  MD   Signed by:   Joaquin Courts  MD on 09/21/2008   Method used:   Print then Give to Patient   RxID:   [id number]  ]  Appended Document: ROUTINE CK/EST/VS FAXED REFERRAL TO [facility] FOR THE GI REFERRAL / IT WAS FAXED AS A URGENT REFERRAL.  HOPE THIS WORKS OUT FOR THIS PATIENT.  PATIENT WAS DISMISSED FROM THE [facility].  [facility] IN NOT UNDER THE [place] [facility].  SHE WOULD HAVE TO PAY UP FRONT OUT OF POCKET.  LELA STURDIVANT NTII

## 2011-01-23 NOTE — Progress Notes (Signed)
Summary: Nerves.  Phone Note Call from Patient   Caller: Patient Call For: Joaquin Courts  MD Summary of Call: Call from pt.  Wants something for her nerves until her appointment ion the Clinics.  Dr. Andrey Campanile to be made aware that pt has called . Angelina Ok RN  January 17, 2010 4:16 PM  Initial call taken by: Angelina Ok RN,  January 17, 2010 4:16 PM  Follow-up for Phone Call        She is not to get any controlled substances and should be directed towards the emergency psychiatry line.  Follow-up by: Joaquin Courts  MD,  January 17, 2010 11:26 PM

## 2011-01-23 NOTE — Assessment & Plan Note (Signed)
Summary: medication adjustment/gg   Vital Signs:  Patient profile:   55 year old female Height:      63 inches (160.02 cm) Weight:      185.02 pounds (84.10 kg) BMI:     32.89 Temp:     98.8 degrees F (37.11 degrees C) oral Pulse rate:   77 / minute BP sitting:   115 / 77  (right arm)  Vitals Entered By: Angelina Ok RN (January 23, 2010 1:31 PM) CC: Depression Is Patient Diabetic? No Pain Assessment Patient in pain? yes     Location: stomach Intensity: 4 Type: heaviness Onset of pain  Constant Nutritional Status BMI of > 30 = obese  Have you ever been in a relationship where you felt threatened, hurt or afraid?No   Does patient need assistance? Functional Status Self care Ambulation Normal Comments Unable to eat.  Wants something to calm her nerves.  Unable to sleep.  Nausea.  Has not vomited.  Feels thta Potassium is low.  Lips, face and throat burning.  Needs refills on meds.   Primary Care Provider:  Joaquin Courts  MD  CC:  Depression.  History of Present Illness: Pt is a 55 yo female w/ past med hx below here for f/u of anxiety symptoms.  She also c/o abdominal tightness, nausea (no vomiting/dairrhea), lack of appetite.  She is also concerned about her K+ being low b/c of some intermittent facial numbness bilaterally.  She notes her primary concern is her anxiety and "just wants something for my nerves, that is it."  She denies SI.  She has not seen the rhematologist yet but rescheduled her appt for Feb 14th.  Depression History:      The patient is having a depressed mood most of the day but denies diminished interest in her usual daily activities.        The patient denies that she feels like life is not worth living, denies that she wishes that she were dead, and denies that she has thought about ending her life.         Preventive Screening-Counseling & Management  Alcohol-Tobacco     Alcohol drinks/day: 0     Smoking Status: never  Current Medications  (verified): 1)  Coreg 6.25 Mg Tabs (Carvedilol) .... Take 1 Tablet By Mouth Two Times A Day 2)  Benazepril Hcl 40 Mg Tabs (Benazepril Hcl) .... Take 1 Tablet By Mouth Once A Day For Blood Pressure. Take This After Finishing The Lisinopril 3)  Antivert 12.5 Mg Tabs (Meclizine Hcl) .Marland Kitchen.. 1-2 Tablets Every 4 Hours As Needed For Dizziness 4)  Maxzide-25 37.5-25 Mg  Tabs (Triamterene-Hctz) .... Take One Tablet By Mouth Daily. 5)  Pravachol 10 Mg  Tabs (Pravastatin Sodium) .... Take 1 Tablet By Mouth Once A Day Before Bed. 6)  Ultram 50 Mg Tabs (Tramadol Hcl) .... One Tab By Mouth Every 8 Hours As Needed For Pain. 7)  Nitrostat 0.4 Mg  Subl (Nitroglycerin) .... Take One Tab Under The Tongue As Needed For Severe Chest Pain.  Do Not Use More Than One A Day. 8)  Prozac 40 Mg Caps (Fluoxetine Hcl) .... Take 2 Tablets By Mouth Once A Day 9)  Norvasc 5 Mg Tabs (Amlodipine Besylate) .... Take 1 Tablet By Mouth Once A Day 10)  Omeprazole 20 Mg Cpdr (Omeprazole) .... Take 1 Tablet By Mouth Once A Day  Allergies (verified): 1)  ! Cipro  Past History:  Past Medical History: Last updated: 12/04/2009 CAD:  S/p cardiac catherization by Dr.Ganji(3/08)          Mild LAD stenosis (60%)          Ef 50-55%          Medical mangement        w/ repeat cath in 12/08 by Dr. Lynnea Ferrier: essentially normal coronary          arteries with a possible 20% mid left anterior descending lesion versus         a myocardial bridge. A f/u myoview as an outpt was to be done which she did have done.   Hypertension White matter disease, brainstem auditory evoked response test normal 01/2004 Dental caries with gingivitis BPPV Chronic dizziness, evaluated by neuro Insomnia Generalized anxiety disorder with hx of Valium dependence FOBT + stools-no showed to all GI appts elevated D dimer with multiple negative CTAs  Past Surgical History: Last updated: 05/11/2007 Tubal ligation  Social History: Last updated: 09/21/2008 Never  smoked, no alcohol, divorced.  Recently laid off from her job.  Risk Factors: Smoking Status: never (01/23/2010)  Family History: Reviewed history from 10/19/2007 and no changes required. Mother, son, and brother with htn.   No family hx of MI, DM, or breast cancer.    Social History: Reviewed history from 09/21/2008 and no changes required. Never smoked, no alcohol, divorced.  Recently laid off from her job.  Review of Systems       As per HPI.   Physical Exam  General:  alert, oriented, tearful. Eyes:  anicteric Mouth:  poor dentition, MMM Lungs:  normal respiratory effort and normal breath sounds.   Heart:  normal rate, regular rhythm, no murmur, no gallop, and no rub.   Abdomen:  +BS's, soft, difusely TTP (mildy) but worse in the epigastrium without rebound/guarding, straie noted.  Extremities:  trace lower extremity edema Neurologic:  gait normal Psych:  poor eye contact, oriented, depressed appearing, tearful at times, no SI   Impression & Recommendations:  Problem # 1:  ANXIETY DISORDER, GENERALIZED (ICD-300.02) Dr. Meredith Pel and I spoke w/ her extensively about her options regarding therapy and that she would be better served at [facility].  She refused to go for unclear reasons.  We will not write for controlled substances given neg UDS for benzo's x 3 in the setting of presribing benzos and her taking them regularly.  I would much prefer her going to [facility], but since she refuses, will try trail of buspar to see if this helps.  She denies SI.  Her updated medication list for this problem includes:    Prozac 40 Mg Caps (Fluoxetine hcl) .Marland Kitchen... Take 2 tablets by mouth once a day    Buspirone Hcl 15 Mg Tabs (Buspirone hcl) .Marland Kitchen... Take 1/2 tab by mouth twice a day for  two weeks and then increase to a full tab by mouth twice daily.  Problem # 2:  HYPERTENSION (ICD-401.9) Pt feels K is low.  Will check BMET.  Her updated medication list for this problem includes:     Coreg 6.25 Mg Tabs (Carvedilol) .Marland Kitchen... Take 1 tablet by mouth two times a day    Benazepril Hcl 40 Mg Tabs (Benazepril hcl) .Marland Kitchen... Take 1 tablet by mouth once a day for blood pressure. take this after finishing the lisinopril    Maxzide-25 37.5-25 Mg Tabs (Triamterene-hctz) .Marland Kitchen... Take one tablet by mouth daily.    Norvasc 5 Mg Tabs (Amlodipine besylate) .Marland Kitchen... Take 1 tablet by mouth once a  day  Orders: T-Basic Metabolic Panel ([id number])  BP today: 115/77 Prior BP: 135/84 (12/04/2009)  Labs Reviewed: K+: 3.3 (12/04/2009) Creat: : 1.06 (12/04/2009)   Chol: 206 (03/28/2009)   HDL: 75 (03/28/2009)   LDL: 90 (03/28/2009)   TG: 206 (03/28/2009)  Complete Medication List: 1)  Coreg 6.25 Mg Tabs (Carvedilol) .... Take 1 tablet by mouth two times a day 2)  Benazepril Hcl 40 Mg Tabs (Benazepril hcl) .... Take 1 tablet by mouth once a day for blood pressure. take this after finishing the lisinopril 3)  Antivert 12.5 Mg Tabs (Meclizine hcl) .Marland Kitchen.. 1-2 tablets every 4 hours as needed for dizziness 4)  Maxzide-25 37.5-25 Mg Tabs (Triamterene-hctz) .... Take one tablet by mouth daily. 5)  Pravachol 10 Mg Tabs (Pravastatin sodium) .... Take 1 tablet by mouth once a day before bed. 6)  Ultram 50 Mg Tabs (Tramadol hcl) .... One tab by mouth every 8 hours as needed for pain. 7)  Nitrostat 0.4 Mg Subl (Nitroglycerin) .... Take one tab under the tongue as needed for severe chest pain.  do not use more than one a day. 8)  Prozac 40 Mg Caps (Fluoxetine hcl) .... Take 2 tablets by mouth once a day 9)  Norvasc 5 Mg Tabs (Amlodipine besylate) .... Take 1 tablet by mouth once a day 10)  Omeprazole 20 Mg Cpdr (Omeprazole) .... Take 1 tablet by mouth once a day 11)  Buspirone Hcl 15 Mg Tabs (Buspirone hcl) .... Take 1/2 tab by mouth twice a day for  two weeks and then increase to a full tab by mouth twice daily.  Patient Instructions: 1)  Please make a followup appointment in 1 month. 2)  Please [facility] about [facility] to  the [facility]. 3)  Please take your new medicine for anxiety.  4)  Please have the rheumatologist send [place] your records. Prescriptions: BUSPIRONE HCL 15 MG TABS (BUSPIRONE HCL) Take 1/2 tab by mouth twice a day for  two weeks and then increase to a full tab by mouth twice daily.  #60 x 1   Entered and Authorized by:   Joaquin Courts  MD   Signed by:   Joaquin Courts  MD on 01/23/2010   Method used:   Print then Give to Patient   RxID:   [id number]   Prevention & Chronic Care Immunizations   Influenza vaccine: Not documented   Influenza vaccine deferral: Refused  (11/07/2009)    Tetanus booster: Not documented    Pneumococcal vaccine: Not documented  Colorectal Screening   Hemoccult: Not documented   Hemoccult action/deferral: Refused  (01/23/2010)    Colonoscopy: Not documented   Colonoscopy action/deferral: Refused  (01/23/2010)  Other Screening   Pap smear:  Specimen Adequacy: Satisfactory for evaluation.   Interpretation/Result:Negative for intraepithelial Lesion or Malignancy.     (07/20/2008)   Pap smear action/deferral: Ordered  (11/07/2009)   Pap smear due: 07/2008    Mammogram: ASSESSMENT: Negative - BI-RADS 1^MM DIGITAL SCREENING  (04/23/2009)   Smoking status: never  (01/23/2010)  Lipids   Total Cholesterol: 206  (03/28/2009)   LDL: 90  (03/28/2009)   LDL Direct: Not documented   HDL: 75  (03/28/2009)   Triglycerides: 206  (03/28/2009)    SGOT (AST): 24  (12/04/2009)   SGPT (ALT): 17  (12/04/2009)   Alkaline phosphatase: 73  (12/04/2009)   Total bilirubin: 0.4  (12/04/2009)    Lipid flowsheet reviewed?: Yes   Progress toward LDL goal: At goal  Hypertension  Last Blood Pressure: 115 / 77  (01/23/2010)   Serum creatinine: 1.06  (12/04/2009)   Serum potassium 3.3  (12/04/2009)    Hypertension flowsheet reviewed?: Yes   Progress toward BP goal: At goal  Self-Management Support :    Patient will work on the following items until the  next clinic visit to reach self-care goals:     Medications and monitoring: take my medicines every day, check my blood pressure  (01/23/2010)     Eating: eat more vegetables, eat foods that are low in salt, eat baked foods instead of fried foods  (01/23/2010)     Activity: take a 30 minute walk every day  (01/23/2010)    Hypertension self-management support: Written self-care plan  (12/04/2009)    Lipid self-management support: Not documented   Process Orders Check Orders Results:     [facility]: ABN not required for this insurance Tests Sent for requisitioning (January 25, 2010 2:07 PM):     01/23/2010: [facility] -- T-Basic Metabolic Panel [phone] (signed)   Administrative Note:  2.3.11 I wanted to meet Mrs. Sotelo and introduce myself as we had spoken on the phone Dec. 28.  The patient remembered me as well as our conversation. I noticed she had a prescription for Buspar, we talked about that and how a visit with a mental health practitioner might benefit her. She was very upset with herself and how she has been handling her illnesses, and also added that her mother created additional stresses in her life that she needed to manage.  I took the opportunity again to stress that mental health may be able to help with that as well.  Dr. Andrey Campanile, Dr. Meredith Pel, and myself all  wanted to see her today because we cared about her. She apreciated that. She asked where the [facility] was so I wrote down 2 phone numbers with a name and requested she call, we discussed at length. I also gave her my phone numer at the clinic. I explained that we desire to take the very best care of her but she can not continue to call as frequently as she has and especially during the off hours, and continuing this behavior would prevent [place] from caring for her in the future. I was told she understood entirely and she apologized.  In summary: 1. The patient understands she will not  get clonazepam without seeing a mental health practitioner and they prescribe it. 2.  She promised me she would call the numbers I gave her and see someone at the [facility]. 3.  If they were not helpful she could call me, office number provided. 4.  Failure to change her behaviors as discussed could result in a change of her plan of care or our inability to continue her care. I will f/u prn, please notify me  Raynaldo Opitz Director, [facility] [phone]

## 2011-01-23 NOTE — Progress Notes (Signed)
Summary: Denied visit at behavioral health  Phone Note Call from Patient   Details for Reason: Problem referral Summary of Call: Administrative Note Re: Behavioral health referral Patient called today and was concerned that [facility] would not see her.  Wanted to know if she came in for appt. Friday would Dr. Andrey Campanile give her her medicine, she really didn't think she needed to be seen otherwise.  I told her that Dr. Andrey Campanile would not be giving her benzodiazepines.  I stressed that she needed to f/u on her renal function and to be sure she came in.  She agreed. Spoke with Triage nurse and PCP, called [facility] to clarify situation and spoke to their triage clinician without divulging pt. specific information.  I was told that unless patient is suicidal or homicidal, they could not be seen and a list of therapists was provided who would see pts without charge.  "No magic pills would be given". Clelia Croft Initial call taken by: Raynaldo Opitz,  February 21, 2010 2:37 PM

## 2011-01-23 NOTE — Assessment & Plan Note (Signed)
Summary: dark rectal bleeding,itching,burningand persistent cough/pcp-...   Vital Signs:  Patient Profile:   55 Years Old Female Height:     63 inches (160.02 cm) Weight:      170.9 pounds (77.68 kg) BMI:     30.38 Temp:     97.5 degrees F (36.39 degrees C) oral Pulse rate:   73 / minute BP sitting:   131 / 85  (right arm)  Pt. in pain?   no  Vitals Entered By: Chinita Pester RN (August 25, 2008 1:39 PM)              Is Patient Diabetic? No Nutritional Status BMI of > 30 = obese  Have you ever been in a relationship where you felt threatened, hurt or afraid?No   Does patient need assistance? Functional Status Self care Ambulation Normal     PCP:  Joaquin Courts  MD  Chief Complaint:  Rectal burning and  ?dark blood noted.  And not sleeping at night..  History of Present Illness: Catherine [name] is a 55 yo woman with PMH as outlined in chart.  She is here today for dark rectal bleeding, burning and sensation of something "hanging out".  First noticed few weeks ago.  She is experiencing these symptoms about weekly.  They all happen together.  When this happens, she has noticed the blood in the underwear, not enough to soak through onto the pants.  During this time she has had normal BMs, about 2/day, no hematochezia, melena, or pain.  She has only noticed some greenish color to the stool, but no melena.  She has also had some LLQ pain that waxes and wanes, but has been present for few years.  She denies any changes in her chronic nausea, no vomiting, fever, dysuria or hematuria.        Prior Medications Reviewed Using: Patient Recall  Prior Medication List:  COREG 6.25 MG TABS (CARVEDILOL) Take 1 tablet by mouth two times a day BENAZEPRIL HCL 40 MG TABS (BENAZEPRIL HCL) Take 1 tablet by mouth once a day for blood pressure. Take this after finishing the Lisinopril ANTIVERT 12.5 MG TABS (MECLIZINE HCL) 1-2 tablets every 4 hours as needed for dizziness VALIUM 2 MG TABS  (DIAZEPAM) Take 1 tablet by mouth two times a day as needed anxiety MAXZIDE-25 37.5-25 MG  TABS (TRIAMTERENE-HCTZ) Take one tablet by mouth daily. PRAVACHOL 10 MG  TABS (PRAVASTATIN SODIUM) Take 1 tablet by mouth once a day before bed. DARVOCET A500 100-500 MG  TABS (PROPOXYPHENE N-APAP) take one tab by mouth once every 6 hours as needed for headache PAXIL 20 MG  TABS (PAROXETINE HCL) Take one tablet by mouth in the morning. NITROSTAT 0.4 MG  SUBL (NITROGLYCERIN) Take one tab under the tongue as needed for severe chest pain.  Do not use more than one a day. PREDNISONE 20 MG  TABS (PREDNISONE) take 2 tablet daily by mouth.   Current Allergies: ! CIPRO  Past Medical History:    CAD: S/p cardiac catherization by Dr.Ganji(3/08)             Mild LAD stenosis (60%)             Ef 50-55%             Medical mangement           w/ repeat cath in 12/08 by Dr. Lynnea Ferrier: essentially normal coronary             arteries with  a possible 20% mid left anterior descending lesion versus            a myocardial bridge. A f/u myoview as an outpt was to be done which she did have done.      Hypertension    White matter disease, brainstem auditory evoked response test normal 01/2004    Dental caries with gingivitis    BPPV    Chronic dizziness, evaluated by neuro    Insomnia    Generalized anxiety disorder with hx of Valium dependence    FOBT + stools    elevated D dimer with multiple negative CTAs    Risk Factors: Tobacco use:  never Alcohol use:  no Exercise:  no Seatbelt use:  100 %  PAP Smear History:    Date of Last PAP Smear:  07/20/2008   Review of Systems      See HPI   Physical Exam  General:     alert, cooperative to examination, and overweight-appearing.   Head:     normocephalic and atraumatic.   Eyes:     pupils equal, pupils round, and pupils reactive to light.  anicteric, no injection Mouth:     poor dentition.   Neck:     supple, full ROM, no JVD, and no carotid  bruits.   Lungs:     normal respiratory effort, no accessory muscle use, normal breath sounds, no crackles, and no wheezes.   Heart:     normal rate, regular rhythm, no gallop, no rub, and no JVD.  +1/6 SEM over outflow track (? MR) Abdomen:     soft, non-tender, normal bowel sounds, no distention, no masses, no guarding, and no rebound tenderness.   Rectal:     no external abnormalities, no hemorrhoids, normal sphincter tone, no masses, no tenderness, and no perianal rash.   Minimal brown stool, guaiac negative Neurologic:     alert & oriented X3, cranial nerves II-XII intact, strength normal in all extremities, and gait normal.   Psych:     Oriented X3, memory intact for recent and remote, normally interactive, good eye contact, not anxious appearing, and not depressed appearing.      Impression & Recommendations:  Problem # 1:  RECTAL BLEEDING (ICD-569.3) Will obtain basic labs to assess Hgb and other causes of bleeding (thrombocytopenia or elevated coags, doubt any abnormalities).  Description suggestive of hemmorrhoids, but not noted on exam, guaiac negative.  The 30lb unintentional weight loss over the last year is concerning for malignancy, and since she is over 50, without a screening colonoscopy, will refer to GI for diagnostic colonoscopy.  She has had FOBT + stools during a hospitalization, and has been anemic in the past.   Orders: T-CBC w/Diff ([id number]) Hemoccult Guaiac-1 spec.(in office) (13086) T-Comprehensive Metabolic Panel ([id number]) T-Protime, Auto ([id number]) T-PTT ([id number]) Gastroenterology Referral (GI)   Problem # 2:  HYPERTENSION (ICD-401.9) Well controlled, no changes. Her updated medication list for this problem includes:    Coreg 6.25 Mg Tabs (Carvedilol) .Marland Kitchen... Take 1 tablet by mouth two times a day    Benazepril Hcl 40 Mg Tabs (Benazepril hcl) .Marland Kitchen... Take 1 tablet by mouth once a day for blood pressure. take this after finishing the  lisinopril    Maxzide-25 37.5-25 Mg Tabs (Triamterene-hctz) .Marland Kitchen... Take one tablet by mouth daily.  BP today: 131/85 Prior BP: 116/71 (07/20/2008)  Labs Reviewed: Creat: 0.98 (03/20/2008) Chol: 220 (10/19/2007)   HDL: 40 (10/19/2007)   LDL: 113 (10/19/2007)   TG:  334 (10/19/2007)   Problem # 3:  HYPERLIPIDEMIA (ICD-272.4) Last LDL done inpatient in May of 2009 with new value of 88.  No changes at this time.  Goal LDL of <70 with some CAD.  Consider increase in statin and repeating lipids 3 months after.  LFTs being checked today.  Her updated medication list for this problem includes:    Pravachol 10 Mg Tabs (Pravastatin sodium) .Marland Kitchen... Take 1 tablet by mouth once a day before bed.  Labs Reviewed: Chol: 220 (10/19/2007)   HDL: 40 (10/19/2007)   LDL: 113 (10/19/2007)   TG: 334 (10/19/2007) SGOT: 22 (10/19/2007)   SGPT: 13 (10/19/2007)   Complete Medication List: 1)  Coreg 6.25 Mg Tabs (Carvedilol) .... Take 1 tablet by mouth two times a day 2)  Benazepril Hcl 40 Mg Tabs (Benazepril hcl) .... Take 1 tablet by mouth once a day for blood pressure. take this after finishing the lisinopril 3)  Antivert 12.5 Mg Tabs (Meclizine hcl) .Marland Kitchen.. 1-2 tablets every 4 hours as needed for dizziness 4)  Valium 2 Mg Tabs (Diazepam) .... Take 1 tablet by mouth two times a day as needed anxiety 5)  Maxzide-25 37.5-25 Mg Tabs (Triamterene-hctz) .... Take one tablet by mouth daily. 6)  Pravachol 10 Mg Tabs (Pravastatin sodium) .... Take 1 tablet by mouth once a day before bed. 7)  Darvocet A500 100-500 Mg Tabs (Propoxyphene n-apap) .... Take one tab by mouth once every 6 hours as needed for headache 8)  Paxil 20 Mg Tabs (Paroxetine hcl) .... Take one tablet by mouth in the morning. 9)  Nitrostat 0.4 Mg Subl (Nitroglycerin) .... Take one tab under the tongue as needed for severe chest pain.  do not use more than one a day. 10)  Prednisone 20 Mg Tabs (Prednisone) .... Take 2 tablet daily by mouth.   Patient  Instructions: 1)  Please schedule an appointment with your primary doctor in : next 1-2 months. 2)  Will need to see gastroenterologist for colonoscopy. 3)  Continue all your current medications. 4)  Will check labs today, if there are any changes and/or abnormalities, we will contact you. 5)  If you have any problems sooner, call clinic.   Prescriptions: DARVOCET A500 100-500 MG  TABS (PROPOXYPHENE N-APAP) take one tab by mouth once every 6 hours as needed for headache  #15 x 0   Entered and Authorized by:   Mariea Stable MD   Signed by:   Mariea Stable MD on 08/25/2008   Method used:   Print then Give to Patient   RxID:   [id number]  ]

## 2011-01-23 NOTE — Progress Notes (Signed)
Summary: refill call/ hla  Phone Note Other Incoming   Summary of Call: jim shaw ask me to call pt, i tried at the ph# provided, [phone], got a very fast busy signal also tried the ph # listed on the chart, no answer. i called the [facility] pharm and pt has refills on all her meds available. Initial call taken by: Marin Roberts RN,  September 17, 2010 4:03 PM

## 2011-01-23 NOTE — Progress Notes (Signed)
Summary: after hours call...f/u/ hla  Phone Note Call from Patient Call back at Home Phone [phone]   Caller: Patient Reason for Call: Talk to Doctor Summary of Call: Pt called to discuss her potassium levels.  She was seen in the ED about 5 days ago for leg weakness (ED report says "chest pain") and was told that her K was low at 2.9 (verified on EChart).  She was given 2 doses of K in the ED and was sent home with a RX for 12 more K tablets to take 2 pills once a day until they were gone.  She took the last 2 about 30 minutes before calling me.  She is complaining of the same leg weakness that she had last week when the low K was discovered and was wondering if she should go to the ED to have her K checked again.  I advised the pt that she did not have to go to the ED just to have her K checked, that we could do that in the [facility] tomorrow.  She was very worried about her K still being low, however, and I tried to reassure her that her K was probably back to normal considering she has taken all the KCl tabs as she was supposed to and she has stopped using the HCTZ that caused her low K in the first place.  She was still unsure, so I assured her that we would see her in the [facility] on Mon 03/20/08 in the afternoon to have her K checked and be evaluated for leg weakness (generalized weakness of legs and arms - no alarming signs or sx of a stroke).  She expressed understanding.  Please call the pt for an appt as late as possible on Mon 03/20/08 since she has to work in the AM. Initial call taken by: Chauncey Reading DO,  March 19, 2008 5:48 PM  Follow-up for Phone Call        i spoke w/pt this am, she will come in this pm for lab work, and I told her we will notify her if she nees to be seen, if she does need to be seen wed looks the best schedule wise Follow-up by: Marin Roberts RN,  March 20, 2008 10:12 AM  New Problems: HYPOKALEMIA (ICD-276.8)   New Problems: HYPOKALEMIA (ICD-276.8)

## 2011-01-23 NOTE — Assessment & Plan Note (Signed)
Summary: EST-CK/FU/MEDS/CFB   Vital Signs:  Patient profile:   55 year old female Height:      63 inches (160.02 cm) Weight:      192.5 pounds (87.50 kg) BMI:     34.22 Temp:     97.6 degrees F (36.44 degrees C) oral Pulse rate:   71 / minute BP sitting:   185 / 102  (right arm) Cuff size:   regular  Vitals Entered By: Cynda Familia Duncan Dull) (August 27, 2010 10:07 AM) CC: med refill, no meds for atleast , , Depression Is Patient Diabetic? No Pain Assessment Patient in pain? yes     Location: headache Intensity: 10 Type: aching Onset of pain  Constant-started this morning Nutritional Status BMI of 25 - 29 = overweight  Have you ever been in a relationship where you felt threatened, hurt or afraid?No   Does patient need assistance? Functional Status Self care Ambulation Normal   Primary Care Provider:  Joaquin Courts  MD  CC:  med refill, no meds for atleast , , and Depression.  History of Present Illness: 55 year old patient who comes in to establish care with new PCP. She was admitted to the hospital in 6/11 for atypical chest pain. At that time she had a hx of a 60% LAD, so a cardiac cath was done that showed normal coronary arteries and nl EF. Her BP was up at admission but well controlled at discharge.  She had abnormal tranaminases in 4/11 and was to come back for follow up and hepatitis screen but did not. She has MGUG and needs SPEP every 4 months. Hemonc last saw her 7/11.  However we spent almost an hour discussing her history of an anxiety disorder and why she had been taken off of clonipin. I spent >55 minutes discussing face to face with her about her 3 urine drug screens specific for clonipin that Dr. Andrey Campanile had done that failed to show this drug when she said she was taking it. I discussed her long history with our clinic and her interactions with staff, Raynaldo Opitz, and other MD's. Even as far back as when data was loaded into EMR a history of valium  dependence was noted. She had also had problems with ? abuse of narcotics. She has called multiple residents after hours.  She has been referred multiple times to mental health and REFUSES to go stating she came here for her care and she doesn't know why she can't get it here. She has failed or not given a chance many SSRI's. I told her that I thought I would not be giving her good care if I restarted her on benzos after all of this history and that I think she does indeed have an anxiety disorder that she needs a specialist, a psychiatrist, to care forit.Speaking with our social workier, Dorothe Pea, she has also spoken with her multiple times about going to mental health and each time she does not follow up.  She did spend a good deal of time bad mouthing her last primary care physician and saying "she did this to me,...it is her fault I can't take clonopin anymore" when in fact the chart is clear that her prior MD gave her 3 chances to show she was taking the mediciation.  Depression History:      The patient is having a depressed mood most of the day but denies diminished interest in her usual daily activities.  The patient denies that she has thought about ending her life.         Preventive Screening-Counseling & Management  Alcohol-Tobacco     Alcohol drinks/day: 0     Smoking Status: never  Current Medications (verified): 1)  Coreg 6.25 Mg Tabs (Carvedilol) .... Take 1 Tablet By Mouth Two Times A Day 2)  Antivert 12.5 Mg Tabs (Meclizine Hcl) .Marland Kitchen.. 1-2 Tablets Every 4 Hours As Needed For Dizziness 3)  Pravachol 10 Mg  Tabs (Pravastatin Sodium) .... Take 1 Tablet By Mouth Once A Day Before Bed. 4)  Nitrostat 0.4 Mg  Subl (Nitroglycerin) .... Take One Tab Under The Tongue As Needed For Severe Chest Pain.  Do Not Use More Than One A Day. 5)  Norvasc 5 Mg Tabs (Amlodipine Besylate) .... Take 1 Tablet By Mouth Once A Day 6)  Omeprazole 40 Mg Cpdr (Omeprazole) .... Take 1 Tablet By  Mouth Once A Day 7)  Isosorbide Mononitrate Cr 30 Mg Xr24h-Tab (Isosorbide Mononitrate) .... Take 1 Tablet By Mouth Once A Day  Allergies: 1)  ! Cipro  Past History:  Past Medical History: Reviewed history from 04/04/2010 and no changes required. CAD: S/p cardiac catherization by Dr.Ganji(3/08)          Mild LAD stenosis (60%)          Ef 50-55%          Medical mangement        w/ repeat cath in 12/08 by Dr. Lynnea Ferrier: essentially normal coronary          arteries with a possible 20% mid left anterior descending lesion versus         a myocardial bridge. A f/u myoview as an outpt was to be done which she did have done.   ? 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 Tubal ligation  Family History: Reviewed history from 10/19/2007 and no changes required. Mother, son, and brother with htn.   No family hx of MI, DM, or breast cancer.    Social History: Reviewed history from 02/22/2010 and no changes required. Never smoked, no alcohol, divorced.  Recently laid off from her job and moved in with her mother.  Review of Systems       as per HPI  Physical Exam  General:  alert. sometimes tearful, sometimes angry.  Head:  normocephalic and atraumatic.   Eyes:  vision grossly intact.   Neck:  supple and full ROM.   Lungs:  normal respiratory effort and normal breath sounds.   Heart:  normal rate, regular rhythm, and no murmur.   Abdomen:  soft, non-tender, and normal bowel sounds.   Extremities:  no edema Psych:  Oriented X3, memory intact for recent and remote, and moderately anxious.      Impression & Recommendations:  Problem # 1:  ANXIETY DISORDER, GENERALIZED (ICD-300.02) Greater than 55 minutes spent ALL face to face counseling patient discussing her anxiety disorder, and the need for her to see a mental health professional to get treatment of this. This is fully outlined in the HPI. I told Ms. Smedley that I could not treat this and that she needed to  be treated by psychiatry. I spoke with her at length about her history of valium dependence, 3 negative urine despite her saying she was taking clonipin, and history of trying buspar and multiple SSRI's without benefit. She definitely needs a specialist in this field to help her despite all of this and [place] handing her again a list from the [facility] of providers available to help her she left this list on the table when she left the exam room.  Problem # 2:  TRANSAMINASES, SERUM, ELEVATED (ICD-790.4) I explained to patient that since her tramaminases had been a bit elevated I am rechecking them as well as a hepatits screen. Orders: T-Hepatitis Profile Acute ([id number]) T-Comprehensive Metabolic Panel ([id number])  Problem # 3:  * ? 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 This was last done in 7/11.  Problem # 4:  CHEST PAIN, ATYPICAL (ICD-786.59) I discussed with patient how important it was that her cardiac cath in the hospital had been normal.  Problem # 5:  HYPERTENSION (ICD-401.9) She has not been taking any of her medications because of finances. So we will get funds to [facility] for her to be able to start back on anti-hypertensives. Impertance of adherence discussed. Will follow up in 2 months. Her updated medication list for this problem includes:    Coreg 6.25 Mg Tabs (Carvedilol) .Marland Kitchen... Take 1 tablet by mouth two times a day    Norvasc 5 Mg Tabs (Amlodipine besylate) .Marland Kitchen... Take 1 tablet by mouth once a day  Problem # 6:  Preventive Health Care  (ICD-V70.0) Patient refusedflu shot or any other health maintenance today.  Complete Medication List: 1)  Coreg 6.25 Mg Tabs (Carvedilol) .... Take 1 tablet by mouth two times a day 2)  Antivert 12.5 Mg Tabs (Meclizine hcl) .Marland Kitchen.. 1-2 tablets every 4 hours as needed for dizziness 3)  Pravachol 10 Mg Tabs (Pravastatin sodium) .... Take 1 tablet by mouth once a day before bed. 4)  Nitrostat 0.4 Mg Subl (Nitroglycerin) .... Take one tab under the tongue as needed for severe chest pain.  do not use more than one a day. 5)  Norvasc 5 Mg Tabs (Amlodipine besylate) .... Take 1 tablet by mouth once a day 6)  Omeprazole 40 Mg Cpdr (Omeprazole) .... Take 1 tablet by mouth once a day 7)  Isosorbide Mononitrate Cr 30 Mg Xr24h-tab (Isosorbide mononitrate) .... Take 1 tablet by mouth once a day  Other Orders: Mammogram (Screening) (Mammo)  Patient Instructions: 1)  Please schedule a follow-up appointment in 2 months. 2)  Please go get your 3 blood pressure meds filled today. Dorothe Pea will give you a letter so you can pick them up and the clinic will pay for them. 3)  We will check your liver studies today. 4)  Please schedule to see someone on the mental health providers list. Prescriptions: ISOSORBIDE MONONITRATE CR 30 MG XR24H-TAB (ISOSORBIDE MONONITRATE) Take 1 tablet by mouth once a day  #30 x 5   Entered and Authorized by:   Zoila Shutter MD   Signed by:   Zoila Shutter MD on 08/27/2010   Method used:   Faxed to ...       [facility] Department (retail)       [address] [place], [place]  [address]       Ph: [phone]  Fax: [phone]   RxID:   [id number] NORVASC 5 MG TABS (AMLODIPINE BESYLATE) Take 1 tablet by mouth once a day  #30 x 5   Entered and Authorized by:   Zoila Shutter MD   Signed by:   Zoila Shutter MD on 08/27/2010   Method used:   Faxed to ...       [facility] Department (retail)       [address] [place], [place]  [address]       Ph: [phone]       Fax: [phone]   RxID:   [id number] COREG 6.25 MG TABS (CARVEDILOL) Take 1 tablet by mouth two times a day  #60 x 5   Entered and Authorized by:   Zoila Shutter MD   Signed by:   Zoila Shutter MD on 08/27/2010   Method used:   Faxed to ...       [facility] Department (retail)       [address] [place], [place]  [address]       Ph: [phone]       Fax: [phone]   RxID:   [id number]  Process Orders Check Orders Results:     [facility]: ABN not required for this insurance Tests Sent for requisitioning (August 28, 2010 2:47 PM):     08/27/2010: [facility] -- T-Hepatitis Profile Acute [phone] (signed)     08/27/2010: [facility] -- T-Comprehensive Metabolic Panel [phone] (signed)     Prevention & Chronic Care Immunizations   Influenza vaccine: Not documented   Influenza vaccine deferral: Refused  (11/07/2009)    Tetanus booster: 04/04/2010: Tdap   Td booster deferral: Deferred  (02/06/2010)    Pneumococcal vaccine: Not documented   Pneumococcal vaccine deferral: Deferred  (02/06/2010)  Colorectal Screening   Hemoccult: Not documented   Hemoccult action/deferral: Refused  (01/23/2010)    Colonoscopy: Not documented   Colonoscopy action/deferral: Refused  (01/23/2010)  Other Screening   Pap smear:  Specimen Adequacy: Satisfactory for evaluation.   Interpretation/Result:Negative for intraepithelial Lesion or Malignancy.     (07/20/2008)   Pap smear action/deferral: Ordered  (11/07/2009)   Pap smear due: 07/2008    Mammogram: ASSESSMENT: Negative - BI-RADS 1^MM DIGITAL SCREENING  (04/23/2009)   Mammogram action/deferral: Ordered  (08/27/2010)   Smoking status: never  (08/27/2010)  Lipids   Total Cholesterol: 188  (04/04/2010)   Lipid panel action/deferral: Lipid Panel ordered   LDL: 93  (04/04/2010)   LDL  Direct: Not documented   HDL: 50  (04/04/2010)   Triglycerides: 226  (04/04/2010)    SGOT (AST): 51  (04/04/2010)   BMP action: Ordered   SGPT (ALT): 50  (04/04/2010) CMP ordered    Alkaline phosphatase: 75  (04/04/2010)   Total bilirubin: 0.3  (04/04/2010)  Hypertension   Last Blood Pressure: 185 / 102  (08/27/2010)   Serum creatinine: 1.05  (04/04/2010)   BMP action: Ordered   Serum potassium 3.7  (04/04/2010) CMP ordered     Hypertension flowsheet reviewed?: Yes   Progress toward BP goal: Deteriorated   Hypertension comments: she is off of all of her anti-hypertensives   Self-Management Support :   Personal Goals (by the next clinic visit) :      Personal blood pressure goal: 130/80  (04/04/2010)     Personal LDL goal: 100  (04/04/2010)  Patient will work on the following items until the next clinic visit to reach self-care goals:     Medications and monitoring: take my medicines every day  (08/27/2010)     Eating: eat foods that are low in salt, eat baked foods instead of fried foods  (08/27/2010)     Activity: join a walking program  (08/27/2010)    Hypertension self-management support: Written self-care plan  (08/27/2010)   Hypertension self-care plan printed.    Lipid self-management support: Written self-care plan  (08/27/2010)   Lipid self-care plan printed.   Nursing Instructions: Schedule screening mammogram (see order)

## 2011-01-23 NOTE — Assessment & Plan Note (Signed)
Summary: NEED MEDICATION/ SB.   Vital Signs:  Patient Profile:   55 Years Old Female Weight:      197.5 pounds (89.77 kg) Temp:     98.3 degrees F (36.83 degrees C) oral Pulse rate:   67 / minute BP sitting:   143 / 87  (right arm)  Vitals Entered By: Krystal Eaton Duncan Dull) (March 10, 2007 10:26 AM)             Is Patient Diabetic? No Nutritional Status Normal  Have you ever been in a relationship where you felt threatened, hurt or afraid?No   Does patient need assistance? Functional Status Self care Ambulation Normal   PCP:  Coralie Carpen MD   History of Present Illness: Ms. Arseneault is a 55y/o AAW presenting today for a hospital follow up S/p cardiac catherization for CP found to have mild LAD stenosis (60%) with small plaque rupture and an Ef of 50-55%. She was treated with medical management only. Pt reports no further episodes of CP since discharge.She only c/o being anxious from time to time since she had the cardiac cath along with palpitations. She does c/o some pain at the cath site and uses the hydrocodone prescibed as needed. She has been able to return to her daily activities with no trouble. She works in [employer] and reports no stressful activity. Pt reports taking her medication daily and is able to afford them as well. No further complaints.  Prior Medications: ANTIVERT 12.5 MG TABS (MECLIZINE HCL) 1-2 tablets every 4 hours as needed for dizziness VALIUM 2 MG TABS (DIAZEPAM) Take 1 tablet by mouth two times a day as needed anxiety LISINOPRIL 10 MG TABS (LISINOPRIL) Take 1 tablet by mouth once a day ZOCOR 40 MG TABS (SIMVASTATIN) Take 1 tablet by mouth once a day EFFEXOR 75 MG TABS (VENLAFAXINE HCL) Take 1 tablet by mouth once a day Current Allergies (reviewed today): No known allergies   Past Medical History:    CAD: S/p cardiac catherization by Dr.Ganji(3/08)             Mild LAD stenosis (60%)             Ef 50-55%             Medical mangement  Hypertension    White matter disease, 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   Head:     normocephalic and atraumatic.   Ears:     no external deformities.   Nose:     no external deformity.   Mouth:     poor dentition and gingival inflammation.   Neck:     supple.   Lungs:     normal respiratory effort, normal breath sounds, no crackles, and no wheezes.   Heart:     normal rate, regular rhythm, no murmur, no gallop, and no rub.   Abdomen:     soft, non-tender, normal bowel sounds, and no distention.   Pulses:     R and L carotid,radial,femoral,dorsalis pedis and posterior tibial pulses are full and equal  bilaterally Extremities:     No clubbing, cyanosis, edema, or deformity noted with normal full range of motion of all joints.   Neurologic:     alert & oriented X3, cranial nerves II-XII intact, strength normal in all extremities, and gait normal.   Psych:     slightly anxious.      Impression & Recommendations:  Problem # 1:  CORONARY ARTERY DISEASE (ICD-414.00) Pt was explained in detail the mangement of CAD namely risk factors etc to help decrease her anxiety. Compliance with medications was stressed. Coreg was increased as BP today was still high. Pt is to follow up with her cardiologist, Dr. Jacinto Halim in 2 weeks time. Pt was encouraged to keep that appointment. FLP,CMET to be monitored. Her updated medication list for this problem includes:    Lisinopril 10 Mg Tabs (Lisinopril) .Marland Kitchen... Take 1 tablet by mouth once a day    Coreg 6.25 Mg Tabs (Carvedilol) .Marland Kitchen... Take 1 tablet by mouth two times a day   Problem #  2:  HYPERLIPIDEMIA (ICD-272.4) Pt is able to obtain her meds from [facility]. Hopefully there will not be any non-compliance issues. Her updated medication list for this problem includes:    Zocor 40 Mg Tabs (Simvastatin) .Marland Kitchen... Take 1 tablet by mouth once a day   Problem # 3:  HYPERTENSION (ICD-401.9) Since SBP is in the 140's today and pt has been on Coreg 3.125mg  by mouth two times a day for 2 weeks now, I will increasee this to 6.25mg  by mouth two times a day for better control. Pt was instructed to finish her current prescription by taking 2 pills(3.125mg ) two times a day then switch to the new prescription of 6.25mg  by mouth two times a day.   Her updated medication list for this problem includes:    Lisinopril 10 Mg Tabs (Lisinopril) .Marland Kitchen... Take 1 tablet by mouth once a day    Coreg 6.25 Mg Tabs (Carvedilol) .Marland Kitchen... Take 1 tablet by mouth two times a day   Problem # 4:  ANXIETY DISORDER, GENERALIZED (ICD-300.02) Pt was informed to continue Valium as needed  with no change in the dose for now. Her updated medication list for this problem includes:    Valium 2 Mg Tabs (Diazepam) .Marland Kitchen... Take 1 tablet by mouth two times a day as needed anxiety    Effexor 75 Mg Tabs (Venlafaxine hcl) .Marland Kitchen... Take 1 tablet by mouth once a day   Medications Added to Medication List This Visit: 1)  Coreg 6.25 Mg Tabs (Carvedilol) .... Take 1 tablet by mouth two times a day Prescriptions: COREG 6.25 MG TABS (CARVEDILOL) Take 1 tablet by mouth two times a day  #62 x 5   Entered and Authorized by:   Yetta Barre MD   Signed by:   Yetta Barre MD on 03/10/2007   Method used:   Handwritten   RxID:   [id number]    Patient Instructions: 1)  Please schedule a follow-up appointment in 1 month with Primary care physician. 2)  Keep appointment on 03/25/07 with Dr. Jacinto Halim. 3)  Inform Dr. Jacinto Halim that your Coreg dose has been increased to 6.25mg  twice a day. 4)  A blood pressure check to be done at that  visit. 5)  Take the remaining Coreg as two pills twice a day till the bottle is empty,then get the new prescription filled. 6)  It is important that you exercise regularly at least 20 minutes 5 times a week. If you develop chest  pain, have severe difficulty breathing, or feel very tired , stop exercising immediately and seek medical attention. 7)  You need to lose weight. Consider a lower calorie diet and regular exercise.

## 2011-01-23 NOTE — Progress Notes (Signed)
Summary: Discuss med list  Phone Note Outgoing Call   Call placed by: Kincaid Tiger RN,  October 03, 2008 1:37 PM Call placed to: Patient Summary of Call: Catherine [name] over med list with pt. Pt list of meds agree with Dr Tawana Scale med list.

## 2011-01-23 NOTE — Progress Notes (Signed)
Summary: refill/gg  Phone Note Refill Request  on June 22, 2008 4:46 PM  Pt would like a refill on nitroglycerin, she got a bottle while she was in hospital on 04/28/08  Initial call taken by: Merrie Roof RN,  June 22, 2008 4:45 PM  Follow-up for Phone Call        Pt was hospitalized 5/7-5/8 by Dr. Allyson Sabal (cards) for non cardiac CP.  She was prescribed NTG as needed at d/c.  He also notes in his dictation that she has f/u w/ GI scheduled.  Will refill her ntg. Follow-up by: Joaquin Courts  MD,  June 27, 2008 7:08 AM    New/Updated Medications: NITROSTAT 0.4 MG  SUBL (NITROGLYCERIN) Take one tab under the tongue as needed for severe chest pain.  Do not use more than one a day.   Prescriptions: NITROSTAT 0.4 MG  SUBL (NITROGLYCERIN) Take one tab under the tongue as needed for severe chest pain.  Do not use more than one a day.  #30 x 0   Entered and Authorized by:   Joaquin Courts  MD   Signed by:   Joaquin Courts  MD on 06/27/2008   Method used:   Electronically sent to ...       [address]       [address]       [place], [place]  [address]       Ph: [phone]       Fax: [phone]   RxID:   [id number]

## 2011-01-23 NOTE — Letter (Signed)
Summary: *HSN Results Follow up  [facility]  [address] [place], [place] [address]   Phone: [phone]  Fax: [phone]      09/19/2010   Hermelinda Domeier [address] [place], [place]  [address]   Dear  Ms. Hudsyn Pyatt,                            ____S.Drinkard,FNP   ____D. Gore,FNP       ____B. McPherson,MD   ____V. Rankins,MD    ____E. Mulberry,MD    ____N. Daphine Deutscher, FNP  ____D. Reche Dixon, MD    ____K. Philipp Deputy, MD    __x__S. Alben Spittle, PA-C     This letter is to inform you that your recent test(s):  _______Pap Smear    ___x____Lab Test     _______X-ray    ___x____ is within acceptable limits  _______ requires a medication change  _______ requires a follow-up lab visit  _______ requires a follow-up visit with your provider   Comments: Your potassium is on the low side, but in the normal range.  Increase potassium in your diet.  Just eat a banana 1-2 times a week.       _________________________________________________________ If you have any questions, please contact our office                     Sincerely,  Tereso Newcomer PA-C [facility]

## 2011-01-23 NOTE — Progress Notes (Signed)
Summary: Pt. complaint-Catherine [name]  Phone Note Call from Patient   Caller: Patient Reason for Call: Refill Medication Details for Reason: Pt. complaint Summary of Call: Call sent to me from triage RN. Patient unhapy that her clonazepam was d/c'd.  I explained the reason why based on MD notes and drug screens, including the increased specificity of HPLC.  I explained the unfortunate predicament the MD is placed in and why there is no recourse but to discontinue the med.  For her continued health the best thing to do is make an appoinmtent at mental health and discuss her need for clonazepam.-Zeph Riebel Initial call taken by: Raynaldo Opitz,  December 18, 2009 11:59 AM

## 2011-01-23 NOTE — Assessment & Plan Note (Signed)
Summary: wants med changed, states she is not able to take med/pcp-wil...   Vital Signs:  Patient profile:   55 year old female Height:      63 inches Weight:      186 pounds BMI:     33.07 Temp:     97.7 degrees F oral Pulse rate:   74 / minute BP sitting:   144 / 84  (right arm)  Vitals Entered By: Angelina Ok RN (February 06, 2010 9:58 AM) CC: Depression Is Patient Diabetic? No Pain Assessment Patient in pain? no      Nutritional Status BMI of > 30 = obese  Have you ever been in a relationship where you felt threatened, hurt or afraid?No   Does patient need assistance? Functional Status Self care Ambulation Normal Comments Buspar is making her sick.  Very nauseaous.   Hs been taking as ordered.  Feels she was taken of the medication without cause.  Does not want to go to Mental Health.   Primary Care Provider:  Joaquin Courts  MD  CC:  Depression.  History of Present Illness: Pt is a 55 yo female w/ past med hx below here for evaluation of anxiety.  She notes trying the buspar but it didn't help.  She had stomach upset with the medication.  She notes her nerves are still bothering her.  She is requesting alprazolam for her nerves.  She has not followed up with Mental Health b/c "I'm not crazy."  No SI/HI.  Of note, she forgot to come in to get her f/u labs done but she did remember to stop the benazapril and maxide.  She notes she is not drinking well b/c she doens't want to do it.   She no-showed to her rhumatology appointment at [facility].   Depression History:      The patient is having a depressed mood most of the day but denies diminished interest in her usual daily activities.        The patient denies that she feels like life is not worth living, denies that she wishes that she were dead, and denies that she has thought about ending her life.         Preventive Screening-Counseling & Management  Alcohol-Tobacco     Alcohol drinks/day: 0     Smoking  Status: never  Current Medications (verified): 1)  Coreg 6.25 Mg Tabs (Carvedilol) .... Take 1 Tablet By Mouth Two Times A Day 2)  Antivert 12.5 Mg Tabs (Meclizine Hcl) .Marland Kitchen.. 1-2 Tablets Every 4 Hours As Needed For Dizziness 3)  Pravachol 10 Mg  Tabs (Pravastatin Sodium) .... Take 1 Tablet By Mouth Once A Day Before Bed. 4)  Ultram 50 Mg Tabs (Tramadol Hcl) .... One Tab By Mouth Every 8 Hours As Needed For Pain. 5)  Nitrostat 0.4 Mg  Subl (Nitroglycerin) .... Take One Tab Under The Tongue As Needed For Severe Chest Pain.  Do Not Use More Than One A Day. 6)  Norvasc 5 Mg Tabs (Amlodipine Besylate) .... Take 1 Tablet By Mouth Once A Day 7)  Omeprazole 20 Mg Cpdr (Omeprazole) .... Take 1 Tablet By Mouth Once A Day  Allergies (verified): 1)  ! Cipro  Past History:  Past Medical History: Last updated: 12/04/2009 CAD: S/p cardiac catherization by Dr.Ganji(3/08)          Mild LAD stenosis (60%)          Ef 50-55%  Medical mangement        w/ repeat cath in 12/08 by Dr. Lynnea Ferrier: essentially normal coronary          arteries with a possible 20% mid left anterior descending lesion versus         a myocardial bridge. A f/u myoview as an outpt was to be done which she did have done.   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  Recently laid off from her job.  Risk Factors: Smoking Status: never (02/06/2010)  Family History: Reviewed history from 10/19/2007 and no changes required. Mother, son, and brother with htn.   No family hx of MI, DM, or breast cancer.     Social History: Reviewed history from 09/21/2008 and no changes required. Never smoked, no alcohol, divorced.  Recently laid off from her job.  Review of Systems       As per HPI.    Physical Exam  General:  alert, oriented, depressed affect. Mouth:  MMM, poor dentition. Lungs:  CTA bilaterally, nl effort Heart:  RRR, no murmur, no gallop, and no rub.   Abdomen:  +BS's, soft, difusely TTP (mildy) but worse in the epigastrium without rebound/guarding, straie noted.  Extremities:  no edema   Impression & Recommendations:  Problem # 1:  RENAL FAILURE, ACUTE (ICD-584.9) Will f/u repeat BMET and UA today to look for etiology of renal failure.  Maxide/ACE inh has been stopped.  Also checking SPEP/UPEP previously ordered for workup of anemia and elevated ESR.  Orders: T-Basic Metabolic Panel [id number]) T-Urinalysis [phone]) T- * Misc. Laboratory test [phone]) T- * Misc. Laboratory test [phone])  Problem # 2:  SEDIMENTATION RATE, ELEVATED (ICD-790.1) ? etiology.  Tx'd dental infection and it did not respond.  Tx'd her w/ steroids b/c of ? of TA and it did not decrease much.  Also, had BRBPR w/ heme + stools but no-showed to her GI appts, but pathology surrounding this could be contributing.  At this point, given her anemia w/ panel c/w AOCDz, two weakly + ANA's, multiple systemic complaints, and ? of vasculitis on MRI, I think it is reasonable to send her to a rheumatologist for further workup.  We called her on numerous occasions concerning this rheum appt and she no-showed.  Will plan to f/u SPEP/UPEP.  Problem # 3:  HYPERTENSION (ICD-401.9) BP up only slightly off meds.  Will f/u cr and see pt back in two weeks.  The following medications were removed from the medication list:    Benazepril Hcl 40 Mg Tabs (Benazepril hcl) .Marland Kitchen... Take 1 tablet by mouth once a day for blood pressure. take this after finishing the lisinopril    Maxzide-25 37.5-25 Mg Tabs (Triamterene-hctz) .Marland Kitchen...  Take one tablet by mouth daily. Her updated medication list for this problem includes:    Coreg 6.25 Mg Tabs (Carvedilol) .Marland Kitchen... Take 1 tablet by mouth two times a day    Norvasc 5 Mg Tabs (Amlodipine besylate) .Marland Kitchen... Take 1 tablet by mouth once a day  Problem # 4:  ANXIETY DISORDER, GENERALIZED (ICD-300.02) Requesting xanax.   I will continue to not be comfortable filling benzo's in the setting of  neg 3 UDS's and have informed her of this.  Buspar didn't work.  Pt stopped taking prozac b/c it didn't help and has been on multiple SSRI's in the past with no relief.  I have recommended her to go to Mental Health.  The following medications were removed from the medication list:    Prozac 40 Mg Caps (Fluoxetine hcl) .Marland Kitchen... Take 2 tablets by mouth once a day    Buspirone Hcl 15 Mg Tabs (Buspirone hcl) .Marland Kitchen... Take 1/2 tab by mouth twice a day for  two weeks and then increase to a full tab by mouth twice daily.  Complete Medication List: 1)  Coreg 6.25 Mg Tabs (Carvedilol) .... Take 1 tablet by mouth two times a day 2)  Antivert 12.5 Mg Tabs (Meclizine hcl) .Marland Kitchen.. 1-2 tablets every 4 hours as needed for dizziness 3)  Pravachol 10 Mg Tabs (Pravastatin sodium) .... Take 1 tablet by mouth once a day before bed. 4)  Ultram 50 Mg Tabs (Tramadol hcl) .... One tab by mouth every 8 hours as needed for pain. 5)  Nitrostat 0.4 Mg Subl (Nitroglycerin) .... Take one tab under the tongue as needed for severe chest pain.  do not use more than one a day. 6)  Norvasc 5 Mg Tabs (Amlodipine besylate) .... Take 1 tablet by mouth once a day 7)  Omeprazole 20 Mg Cpdr (Omeprazole) .... Take 1 tablet by mouth once a day  Patient Instructions: 1)  Please make a followup appointment in 2-4 weeks for a checkup. 2)  PLease see the psychiatrist. 3)  You will be called with any abnormal labwork.  Please make sure your phone number is correct at the front desk.  Prevention & Chronic Care Immunizations   Influenza vaccine: Not  documented   Influenza vaccine deferral: Refused  (11/07/2009)    Tetanus booster: Not documented   Td booster deferral: Deferred  (02/06/2010)    Pneumococcal vaccine: Not documented   Pneumococcal vaccine deferral: Deferred  (02/06/2010)  Colorectal Screening   Hemoccult: Not documented   Hemoccult action/deferral: Refused  (01/23/2010)    Colonoscopy: Not documented   Colonoscopy action/deferral: Refused  (01/23/2010)  Other Screening   Pap smear:  Specimen Adequacy: Satisfactory for evaluation.   Interpretation/Result:Negative for intraepithelial Lesion or Malignancy.     (07/20/2008)   Pap smear action/deferral: Ordered  (11/07/2009)   Pap smear due: 07/2008    Mammogram: ASSESSMENT: Negative - BI-RADS 1^MM DIGITAL SCREENING  (04/23/2009)   Smoking status: never  (02/06/2010)  Lipids   Total Cholesterol: 206  (03/28/2009)   LDL: 90  (03/28/2009)   LDL Direct: Not documented   HDL: 75  (03/28/2009)   Triglycerides: 206  (03/28/2009)    SGOT (AST): 24  (12/04/2009)   SGPT (ALT): 17  (12/04/2009)   Alkaline phosphatase: 73  (12/04/2009)   Total bilirubin: 0.4  (12/04/2009)    Lipid flowsheet reviewed?: Yes   Progress toward LDL goal: At goal  Hypertension   Last Blood Pressure: 144 / 84  (02/06/2010)   Serum creatinine: 1.95  (01/23/2010)   Serum potassium 4.0  (01/23/2010)    Hypertension flowsheet reviewed?: Yes   Progress toward BP goal: Deteriorated  Self-Management Support :    Patient will work on the following items until the next clinic visit to reach self-care goals:     Medications and monitoring: take my medicines every day, bring all of my medications to every visit  (02/06/2010)     Eating: drink diet soda  or water instead of juice or soda, eat more vegetables, eat foods that are low in salt, eat baked foods instead of fried foods, eat fruit for snacks and desserts  (02/06/2010)     Activity: take a 30 minute walk every day  (02/06/2010)     Hypertension self-management support: Written self-care plan  (02/06/2010)   Hypertension self-care plan printed.    Lipid self-management support: Not documented   Process Orders Check Orders Results:     [facility]: ABN not required for this insurance Tests Sent for requisitioning (February 06, 2010 1:46 PM):     02/06/2010: [facility] -- T-Basic Metabolic Panel [phone] (signed)     02/06/2010: [facility] -- T-Urinalysis [[id number]] (signed)     02/06/2010: [facility] -- T- * Misc. Laboratory test [phone] (signed)     02/06/2010: [facility] -- T- * Misc. Laboratory test [phone] (signed)

## 2011-01-23 NOTE — Miscellaneous (Signed)
Summary: HIV Testing  HIV Testing   Imported By: Florinda Marker 09/22/2008 15:21:39  _____________________________________________________________________  External Attachment:    Type:   Image     Comment:   External Document

## 2011-01-23 NOTE — Progress Notes (Signed)
  Phone Note Outgoing Call   Call placed by: Dr. Andrey Campanile Call placed to: Patient Action Taken: Phone Call Completed Reason for Call: Discuss lab or test results Summary of Call: Called pt to inform her that her wet prep was positive for BV. Instructed her to pick up her metronidazole 500 mg from the pharmacy and take one pill twice a day.

## 2011-01-23 NOTE — Progress Notes (Signed)
Summary: phone/gg  Phone Note Call from Patient   Summary of Call: Pt called and states her headaches are returning.  She as been on prednisone in the past and she wants to know if you will restart it. Please call pt at [phone] Initial call taken by: Merrie Roof RN,  September 10, 2009 4:44 PM  Follow-up for Phone Call        I would prefer not to restart it b/c I don't think she had temporal arteritis.  Please let her know she needs an appt for an acute visit if her HA's persist.  Thank you! Follow-up by: Joaquin Courts  MD,  September 10, 2009 4:48 PM  Additional Follow-up for Phone Call Additional follow up Details #1::        Pt informed  Additional Follow-up by: Merrie Roof RN,  September 10, 2009 5:16 PM

## 2011-01-23 NOTE — Assessment & Plan Note (Signed)
Summary: Soc. Work  20 min.  SW Consult with Dr. Coralee Pesa and Purnell Shoemaker regarding patient history and current needs.   Patient unwilling to pursue MH evaluation and treatment.   However, we agreed that she needed assist in obtaining her blood pressure meds under [facility].  Wrote letter on her behalf to have them fill scripts and we will reimburse immediately.     Met with patient and explained the letter and if she had any problems to make sure they called here between 9 and 1 to speak with me.   Also explained that I was only able to do this one time.   Clarified again that patient did not want to pursue mental health treatment and she said that [facility] told her she did not have a problem.  I did not address due to her persistant denial of MH issues and her hx of resistance to MH connection. She also disclosed prior issues with Dr. Andrey Campanile which Purnell Shoemaker and I advised her to address with her current physician.

## 2011-01-23 NOTE — Progress Notes (Signed)
  Phone Note Outgoing Call   Call placed by: Joaquin Courts  MD,  November 26, 2007 2:03 PM Call placed to: Patient Action Taken: Phone Call Completed Reason for Call: Discuss lab or test results Summary of Call: Called pt at her request to discuss the findings of all of her inpt studies during her recent hospitalization.  She has her myoview scheduled along with a f/u appt with Dr. Jacinto Halim for next week.  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  Pt initially very concerned about this, but reassurance was provided.  Pt is to call and schedule a f/u with me in one month or earlier if needed.

## 2011-01-23 NOTE — Progress Notes (Signed)
Summary: refill/ hla  Phone Note Refill Request Message from:  Patient on July 16, 2007 1:51 PM  Refills Requested: Medication #1:  PROMETHAZINE HCL 25 MG TABS Take one tablet every 12 hours as needed for nausea   Last Refilled: 05/20/2007 Initial call taken by: Marin Roberts RN,  July 16, 2007 1:51 PM  Follow-up for Phone Call        refill sent electronically Follow-up by: Ulyess Mort MD,  July 16, 2007 5:11 PM     Prescriptions: PROMETHAZINE HCL 25 MG TABS (PROMETHAZINE HCL) Take one tablet every 12 hours as needed for nausea  #20 x 0   Entered and Authorized by:   Ulyess Mort MD   Signed by:   Ulyess Mort MD on 07/16/2007   Method used:   Electronically sent to ...       [facility] [address]*       [address]       [place], [place]  [address]       Ph:        Fax:    RxID:   [id number]

## 2011-01-23 NOTE — Progress Notes (Signed)
Summary: phone/gg    Cutler Sunday  Phone Note Call from Patient   Summary of Call: Pt called and would like you to call her and give her the lab results.  Her # [phone] Initial call taken by: Merrie Roof RN,  October 26, 2007 5:03 PM  Follow-up for Phone Call        Will call her today.

## 2011-01-23 NOTE — Assessment & Plan Note (Signed)
Summary: vaginal itching x 4 days/pcp-wilson/hla   Vital Signs:  Patient Profile:   55 Years Old Female Height:     63 inches (160.02 cm) Weight:      170.7 pounds (77.59 kg) BMI:     30.35 Temp:     97.3 degrees F (36.28 degrees C) oral Pulse rate:   67 / minute BP sitting:   116 / 71  (right arm)  Pt. in pain?   no  Vitals Entered By: Krystal Eaton Duncan Dull) (July 20, 2008 10:31 AM)              Is Patient Diabetic? No Nutritional Status BMI of > 30 = obese  Have you ever been in a relationship where you felt threatened, hurt or afraid?No   Does patient need assistance? Functional Status Self care Ambulation Normal     PCP:  Joaquin Courts  MD  Chief Complaint:  c/o vaginal itching and irritation x3-4 days.  History of Present Illness: Past Medical History: CAD: S/p cardiac catherization by Dr.Ganji(3/08)          Mild LAD stenosis (60%)          Ef 50-55%          Medical mangement        w/ repeat cath in 12/08 by Dr. Lynnea Ferrier: essentially normal coronary          arteries with a possible 20% mid left anterior descending lesion versus         a myocardial bridge. A f/u myoview as an outpt was to be done which she did have done.   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 She denies fever. She also relates dysuria for the last 3-4 days. She had same complaing 3 month ago. She also relates occasional cough with phlegm. The cough is getting better.   She is requesting also darvocet refill for her headaches.  She relates headaches for the last few months, in the parietal-temporal region. the headche last for hours. She also relates blurry vission, with headaches. She denies jaw pain, muscle  weakness, numbness or tingling.    Prior Medications Reviewed Using: Patient Recall  Updated Prior Medication List: COREG 6.25 MG TABS (CARVEDILOL) Take 1 tablet by mouth two times a day BENAZEPRIL HCL 40 MG TABS (BENAZEPRIL HCL) Take 1 tablet by mouth once a day for blood pressure. Take this after finishing the Lisinopril ANTIVERT 12.5 MG TABS (MECLIZINE HCL) 1-2 tablets every 4 hours as needed for dizziness VALIUM 2 MG TABS (DIAZEPAM) Take 1 tablet by mouth two times a day as needed anxiety MAXZIDE-25 37.5-25 MG  TABS (TRIAMTERENE-HCTZ) Take one tablet by mouth daily. PRAVACHOL 10 MG  TABS (PRAVASTATIN SODIUM) Take 1 tablet by mouth once a day before bed. DARVOCET A500 100-500 MG  TABS (PROPOXYPHENE N-APAP) take one tab by mouth once every 6 hours as needed for headache PAXIL 20 MG  TABS (PAROXETINE HCL) Take one tablet by mouth in the morning. NITROSTAT 0.4 MG  SUBL (NITROGLYCERIN) Take one tab under the tongue as needed for severe chest pain.  Do not use more than one a day.  Current Allergies (reviewed today): ! CIPRO    Risk Factors: Tobacco use:  never Alcohol use:  no Exercise:  no Seatbelt use:  100 %  PAP Smear History:    Date of Last PAP Smear:  07/08/2007   Review of Systems  The patient denies fever, decreased hearing, chest pain, syncope, dyspnea on exertion, peripheral edema, abdominal pain, melena, hematochezia, muscle weakness, transient blindness, and difficulty walking.     Physical Exam  General:     alert, well-developed, and well-nourished.   Head:     normocephalic and atraumatic.   Eyes:     vision grossly intact, pupils equal, pupils round, and pupils reactive to light.   Lungs:     normal respiratory effort, no intercostal retractions, no accessory muscle use, and normal breath sounds.   Heart:     normal rate and regular rhythm.   Genitalia:     whitish vaginal wall with erythema. no ulcer.vaginal discharge.   Msk:     normal ROM, no  joint tenderness, no joint swelling, and no joint warmth.   Neurologic:     alert & oriented X3, cranial nerves II-XII intact, strength normal in all extremities, sensation intact to light touch, and gait normal.      Impression & Recommendations:  Problem # 1:  VAGINAL DISCHARGE (ICD-623.5) Patient relates vaginal itching for the last 4 days. on pelvic exam she has vaginal discharge and  eythema on vaginal wall. She had a prior culture positive for gardenella vaginalis. I will treat empirically for bacterial vaginosis with metronidazole due to priors result. I will also order chlamydia, and gonorrhea probe, and wet prep. I will treat as needed after result came back. She also relates dysuria, I will get UA, and urine culture. Orders: T-Wet Prep (in-house) [id number]) T-Urinalysis [phone]) T-Culture, Urine ([id number]) T-GC Probe, genital [id number]) T-Chlamydia ([id number]) T-Pap Smear, Thin Prep  ([id number])   Problem # 2:  SYMPTOM, HEADACHE (ICD-784.0) She relates headcahes for the last few months, she also relates blurry vision,Her headache is in the temporal- parietal area. She relatespain on palpation when she has the headache. I will order ESR to rule out temporal arteritis. My suspicious is low because she denies worsening of blurry vision and the headaches int getting worse. For this reason I will wait thw result of ESR to prescribe prednisone. Her neuro exam was normal. She denies headache on thisvisit. If her pain persist she might need ct head. The following medications were removed from the medication list:    Ibuprofen 800 Mg Tabs (Ibuprofen) .Marland Kitchen... Take one tablet twice a day as needed for pain.  Her updated medication list for this problem includes:    Coreg 6.25 Mg Tabs (Carvedilol) .Marland Kitchen... Take 1 tablet by mouth two times a day    Darvocet A500 100-500 Mg Tabs (Propoxyphene n-apap) .Marland Kitchen... Take one tab by mouth once every 6 hours as needed for  headache  Orders: TLB-Sedimentation Rate (ESR) ([id number])   Problem # 3:  HYPERTENSION (ICD-401.9) Bloodpressurewell control. continue with same treatment. I will give refill for Maxzide. Her updated medication list for this problem includes:    Coreg 6.25 Mg Tabs (Carvedilol) .Marland Kitchen... Take 1 tablet by mouth two times a day    Benazepril Hcl 40 Mg Tabs (Benazepril hcl) .Marland Kitchen... Take 1 tablet by mouth once a day for blood pressure. take this after finishing the lisinopril    Maxzide-25 37.5-25 Mg Tabs (Triamterene-hctz) .Marland Kitchen... Take one tablet by mouth daily.   Complete Medication List: 1)  Coreg 6.25 Mg Tabs (Carvedilol) .... Take 1 tablet by mouth two times a  day 2)  Benazepril Hcl 40 Mg Tabs (Benazepril hcl) .... Take 1 tablet by mouth once a day for blood pressure. take this after finishing the lisinopril 3)  Antivert 12.5 Mg Tabs (Meclizine hcl) .Marland Kitchen.. 1-2 tablets every 4 hours as needed for dizziness 4)  Valium 2 Mg Tabs (Diazepam) .... Take 1 tablet by mouth two times a day as needed anxiety 5)  Maxzide-25 37.5-25 Mg Tabs (Triamterene-hctz) .... Take one tablet by mouth daily. 6)  Pravachol 10 Mg Tabs (Pravastatin sodium) .... Take 1 tablet by mouth once a day before bed. 7)  Darvocet A500 100-500 Mg Tabs (Propoxyphene n-apap) .... Take one tab by mouth once every 6 hours as needed for headache 8)  Paxil 20 Mg Tabs (Paroxetine hcl) .... Take one tablet by mouth in the morning. 9)  Nitrostat 0.4 Mg Subl (Nitroglycerin) .... Take one tab under the tongue as needed for severe chest pain.  do not use more than one a day. 10)  Metronidazole 500 Mg Tabs (Metronidazole) .Marland Kitchen.. 1 tablet twice a day for 7 days. take it with food.   Patient Instructions: 1)  Please schedule a follow-up appointment in 2 weeks with PCP.   Prescriptions: DARVOCET A500 100-500 MG  TABS (PROPOXYPHENE N-APAP) take one tab by mouth once every 6 hours as needed for headache  #15 x 0   Entered and Authorized by:   Hartley Barefoot MD   Signed by:   Hartley Barefoot MD on 07/20/2008   Method used:   Print then Give to Patient   RxID:   [id number] MAXZIDE-25 37.5-25 MG  TABS (TRIAMTERENE-HCTZ) Take one tablet by mouth daily.  #30 x 8   Entered and Authorized by:   Hartley Barefoot MD   Signed by:   Hartley Barefoot MD on 07/20/2008   Method used:   Electronically sent to ...       [address]*       [address]       [place], [place]  [address]       Ph: [phone]       Fax: [phone]   RxID:   [id number]  ]

## 2011-01-23 NOTE — Assessment & Plan Note (Signed)
Summary: New Patient: HTN; Anxiety    Vital Signs:  Patient profile:   55 year old female Height:      63 inches Weight:      193 pounds BMI:     34.31 Temp:     99.0 degrees F oral Pulse rate:   76 / minute Pulse rhythm:   regular Resp:     18 per minute BP sitting:   140 / 90  (left arm) Cuff size:   regular  Vitals Entered By: [name] (September 18, 2010 9:14 AM)   Past History:  Past Medical History: CAD: S/p cardiac catherization by Dr.Ganji(3/08)          Mild LAD stenosis (60%)          Ef 50-55%          Medical mangement        w/ repeat cath in 12/08 by Dr. Lynnea Ferrier: essentially normal coronary          arteries with a possible 20% mid left anterior descending lesion versus         a myocardial bridge. A f/u myoview as an outpt was to be done which she did have done.          **repeat cath 05/2010:  normal cors, normal LVF (admx with chest pain) - Dr. Tresa Endo at [facility] ? 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 Gaylyn Rong       a.  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 Tubal ligation   Review of Systems GI:  Denies bloody stools, dark tarry stools, and vomiting blood.   Physical Exam  General:  alert, well-developed, and well-nourished.   Head:  normocephalic and atraumatic.   Eyes:  pupils equal, pupils round, and pupils reactive to light.   Ears:  R ear normal and L ear normal.   Mouth:  pharynx pink and moist, poor dentition, and teeth missing.   Neck:  supple.   Lungs:  normal breath sounds, no crackles, and no wheezes.   Heart:  normal rate and regular rhythm.     Abdomen:  soft and non-tender.   Extremities:  no edema  Neurologic:  alert & oriented X3 and cranial nerves II-XII intact.   Psych:  normally interactive, not anxious appearing, and flat affect.     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 . . we have Protonix at our pharmacy - will change to this new Rx written  Her updated medication list for this problem includes:    Protonix 40 Mg Tbec (Pantoprazole sodium) .Marland Kitchen... Take 1 tablet by mouth once a day for stomach  Problem # 3:  HYPERTENSION (ICD-401.9) states not taking anything will start her back on norvasc 5 stop coreg (HR 60s without meds) stop isosorbide (no CAD on cath) return for BP check in 3 weeks  The following medications were removed from the medication list:    Coreg 6.25 Mg Tabs (Carvedilol) .Marland KitchenMarland KitchenMarland KitchenMarland Kitchen  Take 1 tablet by mouth two times a day Her updated medication list for this problem includes:    Norvasc 5 Mg Tabs (Amlodipine besylate) .Marland Kitchen... Take 1 tablet by mouth once a day  Problem # 4:  HYPERLIPIDEMIA (ICD-272.4) restart pravastatin check FLP and LFTs in 3 mos  Her updated medication list for this problem includes:    Pravachol 10 Mg Tabs (Pravastatin sodium) .Marland Kitchen... Take 1 tablet by mouth once a day before bed.  Problem # 5:  ANEMIA (ICD-285.9) h/o this Hgb in hosp in June 15 note she has a h/o +FOBT but refused GI will need to address her seeing Dr. Doreatha Martin in near future too focused on anxiety meds at this time  Problem # 6:  HYPOKALEMIA (ICD-276.8)  recent bmet abnl repeat  Orders: T-Basic Metabolic Panel [phone])  Problem # 7:  * ? of MGUS VS LCDD has f/u with Heme q 4 mos  Complete Medication List: 1)  Pravachol 10 Mg Tabs (Pravastatin sodium) .... Take 1 tablet  by mouth once a day before bed. 2)  Norvasc 5 Mg Tabs (Amlodipine besylate) .... Take 1 tablet by mouth once a day 3)  Protonix 40 Mg Tbec (Pantoprazole sodium) .... Take 1 tablet by mouth once a day for stomach 4)  Celexa 20 Mg Tabs (Citalopram hydrobromide) .... Take 1/2 by mouth once daily for one week, then increase to 1 by mouth once daily.   Family History: Reviewed history from 10/19/2007 and no changes required. Mother, son, and brother with htn.   No family hx of MI, DM, or breast cancer.    Social History: Reviewed history from 02/22/2010 and no changes required. Never smoked, no alcohol, divorced.  Recently laid off from her job and moved in with her mother.   Primary Care Provider:  Tereso Newcomer, PA-C   History of Present Illness: Here to establish in [facility].  Just d/c from [facility].  Reviewed prior notes.  Has a h/o benzo dependence.  Has been taken off Klonopin due to multiple negative UDS while patient stated she was taking.  Has refused to go to psych in past.  She states she has taken many different meds in the past with either side effects or no improvement in symptoms.  Effexor, Paxil, Prozac and Lexapro are in her chart.  She also states Zoloft caused side effects.  She requests klonopin, xanax or valium.  In reviewing her chart, this has been discussed with her at length.  Due to her past problems with benzo's, I will not be prescribing her benzo's.  I explained to her that it is not in her best interest to receive these medicines.  She states she is depressed.  She denies suicidal ideations.  She has insomnia.  She reports panic attacks.  States she feels anxious at times and has to leave the situation.    HTN:  Not taking any meds.  She was given isosorbide at d/c from hosp.  Not sure why with her normal cath.  She is not really taking it.  States she was not given refills for her meds.  In looking at her chart, her coreg, norvasc, omeprazole  and pravastatin were all sent to the HD pharmacy this month with multiple refills.  States "I have not been notified about them."    Chest Pain:  Still has from time to time. Not really taking omeprazole.  Notes occ DOE.  No syncope.  Thinks isosorbide helped some.  Her heart cath in June demonstrated no coronary disease.       Problems Prior to Update: 1)  Hypokalemia  (ICD-276.8) 2)  Gerd  (ICD-530.81) 3)  Transaminases, Serum, Elevated  (ICD-790.4) 4)  ? of Mgus Vs Lcdd  () 5)  ? of Multiple Myeloma  (ICD-203.00) 6)  Depression, Prolonged  (ICD-309.1) 7)  Anemia  (ICD-285.9) 8)  Chest Pain, Atypical  (ICD-786.59) 9)  Sedimentation Rate, Elevated  (ICD-790.1) 10)  Abscess, Tooth  (ICD-522.5) 11)  Hepatic Cyst  (ICD-573.8) 12)  Gynecological Examination, Routine  (ICD-V72.31) 13)  Symptom, Headache  (ICD-784.0) 14)  Hyperlipidemia  (ICD-272.4) 15)  Coronary Artery Disease  (ICD-414.00) 16)  Insomnia  (ICD-780.52) 17)  Health Maintenance Exam  (ICD-V70.0) 18)  Vertigo, Benign Paroxysmal Position  (ICD-386.11) 19)  Anxiety Disorder, Generalized  (ICD-300.02) 20)  Dental Caries  (ICD-521.00) 21)  Hypertension  (ICD-401.9)  Allergies: 1)  ! Cipro   Patient Instructions: 1)  You need to take the following medicines: 2)  Norvasc (Amlodipine) for blood pressure. 3)  Pravachol (pravastatin) for cholesterol. 4)  Protonix (Pantoprazole) for your stomach. 5)  Celexa (Citalopram) for your nerves. 6)  These medicines have been faxed to the pharmacy at [facility].  [place], please call to see when they will be ready. 7)  Schedule appt with Marchelle Folks in next 2 weeks. 8)  BP check with the nurse in 2-3 weeks.  Notify provider if BP > 140/90. 9)  Schedule follow up on nerves in 6 weeks.  Prescriptions: CELEXA 20 MG TABS (CITALOPRAM HYDROBROMIDE) Take 1/2 by mouth once daily for one week, then increase to 1 by mouth once daily.  #30 x 2   Entered and Authorized by:   Tereso Newcomer PA-C    Signed by:   Tereso Newcomer PA-C on 09/18/2010   Method used:   Faxed to ...       [facility] - Pharmac (retail)       [address] [place], [place]  [address]       Ph: [phone]       Fax: [phone]   RxID:   [id number] NORVASC 5 MG TABS (AMLODIPINE BESYLATE) Take 1 tablet by mouth once a day  #30 x 5   Entered and Authorized by:   Tereso Newcomer PA-C   Signed by:   Tereso Newcomer PA-C on 09/18/2010   Method used:   Faxed to ...       [facility] - Pharmac (retail)       [address] [place], [place]  [address]       Ph: [phone]       Fax: [phone]   RxID:   [id number] PRAVACHOL 10 MG  TABS (PRAVASTATIN SODIUM) Take 1 tablet by mouth once a day before bed.  #30 x 5   Entered and Authorized by:   Tereso Newcomer PA-C   Signed by:   Tereso Newcomer PA-C on 09/18/2010   Method used:   Faxed to ...       [facility] - Pharmac (retail)       [address] [place], [place]  [address]       Ph: [phone]       Fax: [phone]   RxID:   [id number] PROTONIX 40 MG TBEC (PANTOPRAZOLE SODIUM) Take 1 tablet by mouth once a  day for stomach  #30 x 3   Entered and Authorized by:   Tereso Newcomer PA-C   Signed by:   Tereso Newcomer PA-C on 09/18/2010   Method used:   Faxed to ...       [facility] - Pharmac (retail)       [address] [place], [place]  [address]       Ph: [phone]       Fax: [phone]   RxID:   [id number]
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
## 2011-01-23 NOTE — Progress Notes (Signed)
Summary: phone/gg  Phone Note Call from Patient   Caller: Patient Summary of Call: Pt called and wants to know if she should still be taking the prendisone.  She has 1/2 a bottle left, She never finished the bottle of #30 from 4/7. Please advise Pt # [id number] Initial call taken by: Merrie Roof RN,  May 17, 2009 9:36 AM  Follow-up for Phone Call        I spoke w/ pt.  She stopped taking the prednisone about 1 month ago.  I told it was fine for her to stay off of it.  The goal was to get her off of it anyway.  Also, she is still having probs w/ depression/anx concerning losing her job, finances, lack of housing, etc.  I will let [facility] Leash [facility] know and hopefully she can provide her with some resources that might help.  Follow-up by: Joaquin Courts  MD,  May 19, 2009 7:57 PM

## 2011-01-23 NOTE — Miscellaneous (Signed)
Summary: Rehab Report: Culture, Urine  Rehab Report: Culture, Urine   Imported By: Florinda Marker 07/14/2007 13:58:10  _____________________________________________________________________  External Attachment:    Type:   Image     Comment:   External Document

## 2011-01-23 NOTE — Progress Notes (Signed)
Summary: [facility]/gp  Phone Note Outgoing Call   Summary of Call: [facility] called for an appt. per Dr.Wilson. Yesterday they said she already been through intake and did not need their services; they were going tofind out why and let me know.  So, I was following -up today. Initial call taken by: Chinita Pester RN,  April 05, 2010 12:07 PM  Follow-up for Phone Call        Darl Pikes from [facility]  [facility].  called  and stated pt. did not meet their criteria for admission. They do not see any many pt.s d/t limited doctors.  She said pt was given a list of Outpt. therapy Ctrs. who would help her.  Follow-up by: Chinita Pester RN,  April 08, 2010 10:22 AM  Additional Follow-up for Phone Call Additional follow up Details #1::        I called the pt. to see if she received the list of ctrs.; she said she did but unsure if she still had it.  She  said she was not going to any ctrs or anywhere else except for [facility]"Dr. Andrey Campanile  needs to fix this herself." Stated she was taken off Clonazepam and if  this had not happened, she would not be going thru this.  Pt. also requested lab results; I told her her doctor needs to be made awared of her reuiest before info can be released. Additional Follow-up by: Chinita Pester RN,  April 08, 2010 11:03 AM    Additional Follow-up for Phone Call Additional follow up Details #2::    I spoke w/ pt and discussed w/ her the slightly abnormal transaminases and plan to repeat it in 2 weeks and check hepatitis serology.  We also discussed her anxiety issues and she said she would like Fransheska Willingham to call her with a list of possible providers that mental health recommended.  Follow-up by: Joaquin Courts  MD,  April 09, 2010 3:28 PM  Additional Follow-up for Phone Call Additional follow up Details #3:: Details for Additional Follow-up Action Taken: Message left for Darl Pikes at [facility] for list of Outpt. therapy ctrs. Additional Follow-up  by: Chinita Pester RN,  April 17, 2010 10:59 AM  I received a fax list of services from [facility]; list mailed to Ms. Spraker. Chinita Pester RN  April 18, 2010 9:19 AM

## 2011-01-23 NOTE — Progress Notes (Signed)
Summary: refill/gg  Phone Note Refill Request  on February 06, 2009 3:25 PM  Refills Requested: Medication #1:  DARVOCET-N 100 100-650 MG TABS Take one tablet by mouth every six hours as needed for pain.   Last Refilled: 12/26/2008 Pt states 1/2 of bottle of darvocet was taken from her at a "sleep over"  wants refill   Method Requested: Telephone to Pharmacy Initial call taken by: Merrie Roof RN,  February 06, 2009 3:25 PM  Follow-up for Phone Call        Refill denied.  We will not refill this early. Follow-up by: Joaquin Courts  MD,  February 07, 2009 7:29 AM  Additional Follow-up for Phone Call Additional follow up Details #1::        last refill was 12/26/08 so she is do for refill Additional Follow-up by: Merrie Roof RN,  February 07, 2009 3:18 PM    Additional Follow-up for Phone Call Additional follow up Details #2::    Ok, she can have it refilled.  Please call in.  Thanks! Follow-up by: Joaquin Courts  MD,  February 08, 2009 10:45 AM  Additional Follow-up for Phone Call Additional follow up Details #3:: Details for Additional Follow-up Action Taken: called to pharm Additional Follow-up by: Marin Roberts RN,  February 09, 2009 12:13 PM    Prescriptions: DARVOCET-N 100 100-650 MG TABS (PROPOXYPHENE N-APAP) Take one tablet by mouth every six hours as needed for pain.  #60 x 0   Entered and Authorized by:   Joaquin Courts  MD   Signed by:   Joaquin Courts  MD on 02/08/2009   Method used:   Telephoned to ...       [facility] (retail)       [address] [place], [place]  [address]       Ph: [phone]       Fax: [phone]   RxID:   [id number]

## 2011-01-23 NOTE — Progress Notes (Signed)
Summary: Clonazepam refill  Phone Note Call from Patient Call back at [phone]-HER MOTHERS   Summary of Call: MARTIN PT. MS Plotz SAYS THAT HER CLONAZEPAM WWERE ALSO IN THE BAG, AND SHE WANT SO TO KNOW IF SHE CAN GET IT AND ALSO SHE SHAYS THAT SHE HAS NO MONEY TO HAVE THEM CALLED INTO AN OUTSIDE PHARMACY. SHE SAYS CAN YOU PLEASE HELP HER OUT WITH SOMETHING. Initial call taken by: Leodis Rains,  November 18, 2010 2:35 PM  Follow-up for Phone Call        Pt. will wait and get her meds from the [facility]. She is requesting another refill for her Clonazapam her niece has given her money for that. Please send to [facility].  Follow-up by: Gaylyn Cheers RN,  November 18, 2010 4:36 PM  Additional Follow-up for Phone Call Additional follow up Details #1::        med filled on 10/30/2010 and unfortunately as per this office policy lost or stolen benzos or narcotics are not replaced.   Will fill 11/29/2010 when due Additional Follow-up by: Lehman Prom FNP,  November 18, 2010 6:09 PM    Additional Follow-up for Phone Call Additional follow up Details #2::    Pt. notified Gaylyn Cheers RN  November 19, 2010 1:36 PM

## 2011-01-23 NOTE — Progress Notes (Signed)
Summary: refill/gg  Phone Note Refill Request  on November 23, 2007 4:59 PM  Refills Requested: Medication #1:  EFFEXOR XR 75 MG CP24 Take 2 capsules by mouth once a day  Medication #2:  HYDROCHLOROTHIAZIDE 25 MG TABS Take 1 tablet by mouth once a day  Medication #3:  PRAVACHOL 10 MG  TABS Take 1 tablet by mouth once a day before bed.  Medication #4:  IMDUR 30 MG  TB24 One tablet by mouth daily.[facility]  Initial call taken by: Merrie Roof RN,  November 23, 2007 4:59 PM  Follow-up for Phone Call        Refill approved-nurse to complete Follow-up by: Ulyess Mort MD,  November 23, 2007 5:15 PM  Additional Follow-up for Phone Call Additional follow up Details #1::        meds were called into health dept Additional Follow-up by: Merrie Roof RN,  November 25, 2007 10:58 AM      Prescriptions: IMDUR 30 MG  TB24 (ISOSORBIDE MONONITRATE) One tablet by mouth daily.  #90 x 3   Entered and Authorized by:   Ulyess Mort MD   Signed by:   Ulyess Mort MD on 11/23/2007   Method used:   Electronically sent to ...       [address]*       [address]       [place], [place]  [address]       Ph: [phone]       Fax: [phone]   RxID:   [id number] PRAVACHOL 10 MG  TABS (PRAVASTATIN SODIUM) Take 1 tablet by mouth once a day before bed.  #90 x 5   Entered and Authorized by:   Ulyess Mort MD   Signed by:   Ulyess Mort MD on 11/23/2007   Method used:   Electronically sent to ...       [address]       [address]       [place], [place]  [address]       Ph: [phone]       Fax: [phone]   RxID:   [id number] HYDROCHLOROTHIAZIDE 25 MG TABS (HYDROCHLOROTHIAZIDE) Take 1 tablet by mouth once a day  #90 x 1   Entered and Authorized by:   Ulyess Mort MD   Signed by:   Ulyess Mort MD on 11/23/2007   Method used:   Electronically sent to ...       [address]       [address]       [place], [place]  [address]       Ph:  [phone]       Fax: [phone]   RxID:   [id number] EFFEXOR XR 75 MG CP24 (VENLAFAXINE HCL) Take 2 capsules by mouth once a day  #180 x 2   Entered and Authorized by:   Ulyess Mort MD   Signed by:   Ulyess Mort MD on 11/23/2007   Method used:   Electronically sent to ...       [address]*       [address]       [place], [place]  [address]       Ph: [phone]       Fax: [phone]   RxID:   [id number]

## 2011-01-23 NOTE — Progress Notes (Signed)
Summary: Refill/gh  Phone Note Refill Request Message from:  Fax from Pharmacy on June 26, 2009 3:14 PM  Refills Requested: Medication #1:  PROZAC 40 MG CAPS Take 1 tablet by mouth once a day  Method Requested: Fax to Local Pharmacy Initial call taken by: Angelina Ok RN,  June 26, 2009 3:15 PM  Follow-up for Phone Call        Rx called to pharmacy Follow-up by: Angelina Ok RN,  June 29, 2009 10:02 AM      Prescriptions: PROZAC 40 MG CAPS (FLUOXETINE HCL) Take 1 tablet by mouth once a day  #30 x 6   Entered and Authorized by:   Joaquin Courts  MD   Signed by:   Joaquin Courts  MD on 06/28/2009   Method used:   Telephoned to ...       [facility] (retail)       [address] [place], [place]  [address]       Ph: [phone]       Fax: [phone]   RxID:   [id number]

## 2011-01-23 NOTE — Progress Notes (Signed)
Summary: call/ hla  Phone Note Call from Patient   Summary of Call: she has called again Initial call taken by: Marin Roberts RN,  January 16, 2010 5:02 PM

## 2011-01-23 NOTE — Progress Notes (Signed)
Summary: refill/ hla  Phone Note Refill Request Message from:  Fax from Pharmacy on January 22, 2009 9:44 AM  Refills Requested: Medication #1:  BENAZEPRIL HCL 40 MG TABS Take 1 tablet by mouth once a day for blood pressure. Take this after finishing the Lisinopril   Last Refilled: 12/31 Initial call taken by: Marin Roberts RN,  January 22, 2009 9:44 AM  Follow-up for Phone Call        Refill approved-nurse to complete Follow-up by: Joaquin Courts  MD,  January 23, 2009 12:13 PM      Prescriptions: BENAZEPRIL HCL 40 MG TABS (BENAZEPRIL HCL) Take 1 tablet by mouth once a day for blood pressure. Take this after finishing the Lisinopril  #30 x 6   Entered and Authorized by:   Joaquin Courts  MD   Signed by:   Joaquin Courts  MD on 01/23/2009   Method used:   Telephoned to ...       [facility] [address] [phone]* (retail)       [address]       [place], [place]  [address]       Ph: [phone]       Fax: [phone]   RxID:   [id number]

## 2011-01-23 NOTE — Progress Notes (Signed)
  Phone Note Call from Patient   Summary of Call: PT CALLED SAYING SHE GOT A LETTER IN THE MAIL SAYING SHE NEED TO EAT A BABBAB EVERY OTHER DAY ,BUT SHE SAIDS SHE DOES NOT HAVE THE MONEY TO DO THAT//COULD YOU GIVE HER PILLS//[facility]//PHONE [phone] Initial call taken by: Arta Bruce,  September 26, 2010 2:37 PM  Follow-up for Phone Call        a banana..... forward to provider Follow-up by: [name],  September 26, 2010 2:47 PM  Additional Follow-up for Phone Call Additional follow up Details #1::        A banana 1-2 times a week. She does not need a pill. A pill would be much more expensive than a banana. Additional Follow-up by: Tereso Newcomer PA-C,  September 26, 2010 5:21 PM    Additional Follow-up for Phone Call Additional follow up Details #2::    number is disconnected. ... the other line is busy ... [name]  September 27, 2010 1:56 PM    pt is aware Follow-up by: [name],  September 30, 2010 9:36 AM

## 2011-01-23 NOTE — Progress Notes (Signed)
Summary: imdur has been stopped/ hla  Phone Note Call from Patient   Caller: Patient Summary of Call: pt called to say that dr little has told her to stop taking imdur, we need to change her med list Initial call taken by: Marin Roberts RN,  November 30, 2007 4:19 PM    I will change her medication list to reflect Dr. Fredirick Maudlin changes.  This medicine was added by Dr. Lynnea Ferrier during her recent hospitalization after cardiac cath was done.

## 2011-01-23 NOTE — Progress Notes (Signed)
Summary: Soc. Work  Nurse, children's placed by: Social Work Call placed to: Patient Summary of Call: Patient inquiring about resources for clothing.  I have given her info re: [employer].  She is also looking for housing options and provided her information about the [employer].

## 2011-01-23 NOTE — Miscellaneous (Signed)
Summary: [facility]  [facility]   Imported By: Florinda Marker 03/13/2010 10:42:21  _____________________________________________________________________  External Attachment:    Type:   Image     Comment:   External Document

## 2011-01-23 NOTE — Assessment & Plan Note (Signed)
Summary: HTN/Anxiety   Vital Signs:  Patient profile:   55 year old female Menstrual status:  postmenopausal Weight:      187.1 pounds BMI:     33.26 Temp:     97.7 degrees F oral Pulse rate:   72 / minute Pulse rhythm:   regular Resp:     16 per minute BP sitting:   150 / 100  (left arm) Cuff size:   regular  Vitals Entered By: Levon Hedger (October 30, 2010 8:45 AM)  Nutrition Counseling: Patient's BMI is greater than 25 and therefore counseled on weight management options. CC: follow-up visit, Hypertension Management, Lipid Management, Depression Is Patient Diabetic? No Pain Assessment Patient in pain? no       Does patient need assistance? Functional Status Self care Ambulation Normal     Menstrual Status postmenopausal Last PAP Result  Specimen Adequacy: Satisfactory for evaluation.   Interpretation/Result:Negative for intraepithelial Lesion or Malignancy.      Primary Care Provider:  Tereso Newcomer, PA-C  CC:  follow-up visit, Hypertension Management, Lipid Management, and Depression.  History of Present Illness:  Pt into the office for f/u on BP and anxiety.   Obesity - down 6 pounds since the her last visit. Pt reports that she is NOT trying to lose weight.  She has questions about what her ideal weight should be and weight loss   Anxiety -  +SOB +palpitation +sweaty palms +goes to sit alone and rest She has been to see Aquilla Solian as ordered. Previous provider has tried Klonopin and Xanax as ordered  Depression History:      The patient is having a depressed mood most of the day and has a diminished interest in her usual daily activities.  Positive alarm features for depression include insomnia and fatigue (loss of energy).  However, she denies recurrent thoughts of death or suicide.        Psychosocial stress factors include major life changes.  The patient denies that she feels like life is not worth living, denies that she wishes that she  were dead, and denies that she has thought about ending her life.         Depression Treatment History:  Prior Medication Used:   Start Date: Assessment of Effect:   Comments:  Effexor (venlafaxine)     10/30/2010     --       started  Hypertension History:      She denies headache, chest pain, and palpitations.  Pt is taking medications as ordered.        Positive major cardiovascular risk factors include female age 26 years old or older, hyperlipidemia, and hypertension.  Negative major cardiovascular risk factors include non-tobacco-user status.        Positive history for target organ damage include ASHD (either angina/prior MI/prior CABG).  Further assessment for target organ damage reveals no history of stroke/TIA or peripheral vascular disease.    Lipid Management History:      Positive NCEP/ATP III risk factors include female age 62 years old or older, hypertension, and ASHD (either angina/prior MI/prior CABG).  Negative NCEP/ATP III risk factors include no history of early menopause without estrogen hormone replacement, non-tobacco-user status, no prior stroke/TIA, no peripheral vascular disease, and no history of aortic aneurysm.        The patient states that she does not know about the "Therapeutic Lifestyle Change" diet.  The patient does not know about adjunctive measures for cholesterol lowering.  Adjunctive  measures started by the patient include weight reduction.  She expresses no side effects from her lipid-lowering medication.  The patient denies any symptoms to suggest myopathy or liver disease.      Allergies (verified): 1)  ! Cipro  Review of Systems CV:  Denies chest pain or discomfort. Resp:  Denies cough. GI:  Denies abdominal pain, nausea, and vomiting. GU:  Denies discharge. Neuro:  Complains of headaches. Psych:  Complains of anxiety and depression.  Physical Exam  General:  alert.   Head:  normocephalic.   Mouth:  poor dentation Lungs:  normal breath  sounds.   Heart:  normal rate and regular rhythm.   Abdomen:  normal bowel sounds.   Msk:  up to the exam table Neurologic:  alert & oriented X3.   Skin:  color normal.   Psych:  Oriented X3.     Impression & Recommendations:  Problem # 1:  HYPERTENSION (ICD-401.9) BP is elevated today. will increase norvasc to 10mg  by mouth daily (will need to monitor swelling) DASH diet Her updated medication list for this problem includes:    Norvasc 10 Mg Tabs (Amlodipine besylate) ..... One tablet by mouth daily for blood pressure **note change in dose**    Carvedilol 6.25 Mg Tabs (Carvedilol) ..... One tablet by mouth two times a day  Orders: T-Comprehensive Metabolic Panel [id number]) T-CBC w/Diff (09811-91478) Rapid HIV  (92370) T-Syphilis Test (RPR) (29562-13086) T-TSH (57846-96295) T-Urine Microalbumin w/creat. ratio [phone]) UA Dipstick w/o Micro (manual) (53664) EKG w/ Interpretation (93000)  Problem # 2:  GERD (ICD-530.81) monitor foods in the diet Her updated medication list for this problem includes:    Protonix 40 Mg Tbec (Pantoprazole sodium) .Marland Kitchen... Take 1 tablet by mouth once a day for stomach  Problem # 3:  HYPERLIPIDEMIA (ICD-272.4) Will monitor labs today Her updated medication list for this problem includes:    Pravachol 10 Mg Tabs (Pravastatin sodium) .Marland Kitchen... Take 1 tablet by mouth once a day before bed.  Orders: T-Lipid Profile (40347-42595)  Problem # 4:  ANXIETY DISORDER, GENERALIZED (ICD-300.02) pt advised to continue going to see Aquilla Solian Pt has stopped Celexa Will start effexor at 37.5 then increase to 75mg . Clonazepam as needed  The following medications were removed from the medication list:    Celexa 20 Mg Tabs (Citalopram hydrobromide) .Marland Kitchen... Take 1/2 by mouth once daily for one week, then increase to 1 by mouth once daily. Her updated medication list for this problem includes:    Effexor Xr 37.5 Mg Xr24h-cap (Venlafaxine hcl) ..... One  capsule by mouth daily x 1 week then increase to 2 capsules by mouth daily for mood    Clonazepam 1 Mg Tabs (Clonazepam) ..... One tablet by mouth daily as needed for nerves refused flu vaccine today  Complete Medication List: 1)  Pravachol 10 Mg Tabs (Pravastatin sodium) .... Take 1 tablet by mouth once a day before bed. 2)  Norvasc 10 Mg Tabs (Amlodipine besylate) .... One tablet by mouth daily for blood pressure **note change in dose** 3)  Protonix 40 Mg Tbec (Pantoprazole sodium) .... Take 1 tablet by mouth once a day for stomach 4)  Carvedilol 6.25 Mg Tabs (Carvedilol) .... One tablet by mouth two times a day 5)  Sedapap 50-650 Mg Tabs (Butalbital-acetaminophen) .... One tablet by mouth daily as needed for headache 6)  Effexor Xr 37.5 Mg Xr24h-cap (Venlafaxine hcl) .... One capsule by mouth daily x 1 week then increase to 2 capsules by mouth daily for mood  7)  Clonazepam 1 Mg Tabs (Clonazepam) .... One tablet by mouth daily as needed for nerves  Hypertension Assessment/Plan:      The patient's hypertensive risk group is category C: Target organ damage and/or diabetes.  Today's blood pressure is 150/100.  Her blood pressure goal is < 140/90.  Lipid Assessment/Plan:      Based on NCEP/ATP III, the patient's risk factor category is "history of coronary disease, peripheral vascular disease, cerebrovascular disease, or aortic aneurysm".  The patient's lipid goals are as follows: Total cholesterol goal is 200; LDL cholesterol goal is 100; HDL cholesterol goal is 40; Triglyceride goal is 150.  Her LDL cholesterol goal has not been met.     Patient Instructions: 1)  Headaches - may be due to tension/stress 2)  Drink Caffiene FREE pepsi 3)  You have refused the Flu Vaccine today 4)  Blood pressure - elevated today. Increase amlodipine to 10mg  by mouth daily (new Rx sent to pharmacy) You can take 2 of the tablets you have 5)  Anxiety - Start effexor 37.5mg  by mouth daily x 1 week then increase to  2 capsules by mouth daily. 6)  This medication will build up in your system so take it DAILY.  It will help with anxiety and nerves over time. It is not an immediate fix but over time will help. 7)  Keep your appointment with Aquilla Solian 8)  Follow up in this office in 4 weeks for blood pressure. 9)  Take your medications before this visit.  Offer flu vaccine again (refused today).  Check meds - clonazepam Prescriptions: CLONAZEPAM 1 MG TABS (CLONAZEPAM) One tablet by mouth daily as needed for nerves  #30 x 0   Entered and Authorized by:   Lehman Prom FNP   Signed by:   Lehman Prom FNP on 10/30/2010   Method used:   Print then Give to Patient   RxID:   [id number] EFFEXOR XR 37.5 MG XR24H-CAP (VENLAFAXINE HCL) One capsule by mouth daily x 1 week then increase to 2 capsules by mouth daily for mood  #60 x 5   Entered and Authorized by:   Lehman Prom FNP   Signed by:   Lehman Prom FNP on 10/30/2010   Method used:   Faxed to ...       [facility] (retail)       [address] [place], [place]  [address]       Ph: [phone]       Fax: [phone]   RxID:   [id number] NORVASC 10 MG TABS (AMLODIPINE BESYLATE) One tablet by mouth daily for blood pressure **note change in dose**  #30 x 5   Entered and Authorized by:   Lehman Prom FNP   Signed by:   Lehman Prom FNP on 10/30/2010   Method used:   Faxed to ...       [facility] - [facility] (retail)       [address] [place], [place]  [address]       Ph: [phone]       Fax: [phone]   RxID:   [id number]    Orders Added: 1)  Est. Patient Level IV [99214] 2)  T-Lipid Profile [80061-22930] 3)  T-Comprehensive Metabolic Panel [80053-22900] 4)  T-CBC w/Diff [57322-02542] 5)  Rapid HIV  [92370] 6)  T-Syphilis Test (RPR) [70623-76283] 7)  T-TSH [15176-16073] 8)  T-Urine Microalbumin w/creat. ratio  [82043-[address]] 9)  UA Dipstick w/o Micro (manual) [81002] 10)  EKG w/ Interpretation [93000]    Laboratory Results   Urine Tests  Date/Time Received: October 30, 2010 8:56 AM   Routine Urinalysis   Color: lt. yellow Appearance: Clear Glucose: negative   (Normal Range: Negative) Bilirubin: negative   (Normal Range: Negative) Ketone: negative   (Normal Range: Negative) Spec. Gravity: <1.005   (Normal Range: 1.003-1.035) Blood: negative   (Normal Range: Negative) pH: 6.0   (Normal Range: 5.0-8.0) Protein: negative   (Normal Range: Negative) Urobilinogen: 0.2   (Normal Range: 0-1) Nitrite: negative   (Normal Range: Negative) Leukocyte Esterace: negative   (Normal Range: Negative)    Date/Time Received: October 30, 2010 10:23 AM   Other Tests  Rapid HIV: negative    Prevention & Chronic Care Immunizations   Influenza vaccine: Refused  (10/30/2010)   Influenza vaccine deferral: Refused  (11/07/2009)    Tetanus booster: 04/04/2010: Tdap   Td booster deferral: Deferred  (02/06/2010)    Pneumococcal vaccine: Not documented   Pneumococcal vaccine deferral: Deferred  (02/06/2010)  Colorectal Screening   Hemoccult: Not documented   Hemoccult action/deferral: Refused  (01/23/2010)    Colonoscopy: Not documented   Colonoscopy action/deferral: Refused  (01/23/2010)  Other Screening   Pap smear:  Specimen Adequacy: Satisfactory for evaluation.   Interpretation/Result:Negative for intraepithelial Lesion or Malignancy.     (07/20/2008)   Pap smear action/deferral: Ordered  (11/07/2009)   Pap smear due: 07/2008    Mammogram: ASSESSMENT: Negative - BI-RADS 1^MM DIGITAL SCREENING  (04/23/2009)   Mammogram action/deferral: Ordered  (08/27/2010)   Smoking status: never  (08/27/2010)  Lipids   Total Cholesterol: 188  (04/04/2010)   Lipid panel action/deferral: Lipid Panel ordered   LDL: 93  (04/04/2010)   LDL Direct: Not documented   HDL: 50  (04/04/2010)    Triglycerides: 226  (04/04/2010)    SGOT (AST): 36  (08/27/2010)   BMP action: Ordered   SGPT (ALT): 25  (08/27/2010) CMP ordered    Alkaline phosphatase: 84  (08/27/2010)   Total bilirubin: 0.4  (08/27/2010)  Hypertension   Last Blood Pressure: 150 / 100  (10/30/2010)   Serum creatinine: 0.84  (09/18/2010)   BMP action: Ordered   Serum potassium 3.5  (09/18/2010) CMP ordered   Self-Management Support :   Personal Goals (by the next clinic visit) :      Personal blood pressure goal: 130/80  (04/04/2010)     Personal LDL goal: 100  (04/04/2010)    Hypertension self-management support: Written self-care plan  (08/27/2010)    Lipid self-management support: Written self-care plan  (08/27/2010)   Laboratory Results   Urine Tests    Routine Urinalysis   Color: lt. yellow Appearance: Clear Glucose: negative   (Normal Range: Negative) Bilirubin: negative   (Normal Range: Negative) Ketone: negative   (Normal Range: Negative) Spec. Gravity: <1.005   (Normal Range: 1.003-1.035) Blood: negative   (Normal Range: Negative) pH: 6.0   (Normal Range: 5.0-8.0) Protein: negative   (Normal Range: Negative) Urobilinogen: 0.2   (Normal Range: 0-1) Nitrite: negative   (Normal Range: Negative) Leukocyte Esterace: negative   (Normal Range: Negative)      Other Tests  Rapid HIV: negative

## 2011-01-23 NOTE — Progress Notes (Signed)
  Phone Note Outgoing Call   Call placed by: Social Work Call placed to: Patient Summary of Call: Called phone number on chart.   No answer

## 2011-01-23 NOTE — Miscellaneous (Signed)
Summary: Orders Update  Clinical Lists Changes  Orders: Added new Referral order of Social Work Referral (Social ) - Signed 

## 2011-01-23 NOTE — Miscellaneous (Signed)
Summary: Consult  Social Work Consult with Raynaldo Opitz, [facility] Director.  Rosanne Ashing advised me that [place] was unable to access [facility] and the reason given by Mental Health was not clear and possibly that patient did not meet their criteria.  I advised Rosanne Ashing of other area MH resoures that include [facility] here in [place] for counseling (and they also have psychiatry one day per week only).  Also the [facility] provides counseling and psychiatry but no access until March 15th. Rosanne Ashing tells me that Dr. Andrey Campanile plans to speak with Waunita Schooner, Medical Director of [facility] for further clarification.  Also, Dr. Andrey Campanile informs me that Rosey who is being seen at this moment said that the [facility] could see her but not until the end of April.  Manaia does not wish to wait until the end of April and is not   receptive to going to the [facility] per Dr. Andrey Campanile.

## 2011-01-23 NOTE — Progress Notes (Signed)
Summary: Question  Phone Note Call from Patient   Caller: Patient Call For: Joaquin Courts  MD Summary of Call: Call from pt left message on voice mail that she had a question for the nurse.  RTC to pt no answer. Angelina Ok RN  October 04, 2008 11:18 AM  Initial call taken by: Angelina Ok RN,  October 04, 2008 11:18 AM

## 2011-01-23 NOTE — Discharge Summary (Signed)
Summary: Hospital Discharge Update    Hospital Discharge Update:  Date of Admission: 06/11/2010 Date of Discharge: 06/13/2010  Brief Summary:  Pt admitted for c/o chest pain.  CEs negative x 3, however new T-wave inversions observed on EKG in leads V2-V4.  Cards consulted and pt sent for cardiac cath that indicated a normal EF and no significant coronary disease.  Pt is already on Coreg at home as part of her BP regimen.   During her hospitalization, Ms. Bureau expressed frustration at the discontinuation of her Klonopin 2/2 numerous negative urine drug screens, including Klonopin specific assay.  She was referred to Mental Health by Dr. Andrey Campanile; she continues to refuse this referral.  She was advised she will not be discharged with a rx for benzos and was strongly encouraged to follow up with Mental Health and her PCP.  Lab or other results pending at discharge:  Results of 2D echo, however pt aslo had cardiac cath that revealed a normal EF.  Other follow-up issues:  Continued treatment of her anxiety/depression and encouragement to f/u with mental health.   Medication list changes:  Added new medication of ISOSORBIDE MONONITRATE CR 30 MG XR24H-TAB (ISOSORBIDE MONONITRATE) Take 1 tablet by mouth once a day - Signed Changed medication from OMEPRAZOLE 20 MG CPDR (OMEPRAZOLE) Take 1 tablet by mouth once a day to OMEPRAZOLE 40 MG CPDR (OMEPRAZOLE) Take 1 tablet by mouth once a day - Signed Rx of ISOSORBIDE MONONITRATE CR 30 MG XR24H-TAB (ISOSORBIDE MONONITRATE) Take 1 tablet by mouth once a day;  #30 x 0;  Signed;  Entered by: Nelda Bucks DO;  Authorized by: Nelda Bucks DO;  Method used: Handwritten Rx of OMEPRAZOLE 40 MG CPDR (OMEPRAZOLE) Take 1 tablet by mouth once a day;  #30 x 0;  Signed;  Entered by: Nelda Bucks DO;  Authorized by: Nelda Bucks DO;  Method used: Handwritten  The medication, problem, and allergy lists have been updated.  Please see the dictated discharge summary for  details.  Discharge medications:  COREG 6.25 MG TABS (CARVEDILOL) Take 1 tablet by mouth two times a day ANTIVERT 12.5 MG TABS (MECLIZINE HCL) 1-2 tablets every 4 hours as needed for dizziness PRAVACHOL 10 MG  TABS (PRAVASTATIN SODIUM) Take 1 tablet by mouth once a day before bed. NITROSTAT 0.4 MG  SUBL (NITROGLYCERIN) Take one tab under the tongue as needed for severe chest pain.  Do not use more than one a day. NORVASC 5 MG TABS (AMLODIPINE BESYLATE) Take 1 tablet by mouth once a day OMEPRAZOLE 40 MG CPDR (OMEPRAZOLE) Take 1 tablet by mouth once a day ISOSORBIDE MONONITRATE CR 30 MG XR24H-TAB (ISOSORBIDE MONONITRATE) Take 1 tablet by mouth once a day  Other patient instructions:  The clinic will call you with an appointment date and time to follow up with your Primary Care Provider, Dr. Coralee Pesa. Please bring all of your medicines with you to your appointment. Keep taking all of your medicine regularly.  Note: Hospital Discharge Medications & Other Instructions handout was printed, one copy for patient and a second copy to be placed in hospital chart.

## 2011-01-23 NOTE — Progress Notes (Signed)
Summary: Sedapap not available  Phone Note Outgoing Call   Summary of Call: Sedapap without caffeine is not available at [facility]- only with caffeine --  or [facility].  Do you want to change this medication?  Initial call taken by: Dutch Quint RN,  October 30, 2010 10:14 AM  Follow-up for Phone Call        no, need to get medication pt seen in office today.  started on some depression/anxiety medications.  This may be the cause for headaches advise to limit caffiene intake (which we discussed) decreasing tension/stress may improve headaches Follow-up by: Lehman Prom FNP,  October 30, 2010 6:57 PM  Additional Follow-up for Phone Call Additional follow up Details #1::        Left message with female for pt. to return call. Dutch Quint RN  October 31, 2010 10:57 AM     Additional Follow-up for Phone Call Additional follow up Details #2::    Advised of provider's response/recommendations -- verbalized understanding.  Dutch Quint RN  November 01, 2010 12:08 PM

## 2011-01-23 NOTE — Progress Notes (Signed)
Summary: Valium  Phone Note Outgoing Call   Call placed by: Angelina Ok RN,  October 18, 2007 5:36 PM Call placed to: Specialist Summary of Call: Call to pharmacy to check to see if they got prescription for pt's Valium 2 mg.  Pharmacist checked they got prescription but pt was given 5 mg Valium tablets instead.  Dr. Ardyth Harps was notified.  Pt to take 1/2 of the 5 mg tablets and if unable to do so take remainder of tablets back to pharmacy and pick up Valium 2 mg tablets.  Attempts to call pt.  Msg left on pt's answering machine to call the Clinics and ask for the Triage Nurse.   Initial call taken by: Angelina Ok RN,  October 18, 2007 5:40 PM  Follow-up for Phone Call        Pt called to request Darvocet refill. I called in 60 tablets with no refills. Follow-up by: Carlus Pavlov MD,  March 27, 2008 6:02 PM      Prescriptions: DARVOCET A500 100-500 MG  TABS (PROPOXYPHENE N-APAP) take one tab by mouth once every 6 hours as needed for headache  #60 x 0   Entered by:   Carlus Pavlov MD   Authorized by:   Angelina Ok RN   Signed by:   Angelina Ok RN on 03/29/2008   Method used:   Print then Give to Patient   RxID:   [id number]

## 2011-01-23 NOTE — Progress Notes (Signed)
Summary: Potassium prescription  Phone Note Call from Patient   Caller: Patient Summary of Call: RTC from pt.  Given information that Potassium is low and that she needs to take additional Potassium.  [facility] lab work in 1 week as well.  Pt voiced understanding of.  Prescription for KCL 40 meq by mouth daily x 3 days [facility] [place].  No refills per order of Dr. Ardyth Harps.   Pt given appointment for lab work (BMET) on 06/28/07 at 10:00 AM per order of Dr. Ardyth Harps. Initial call taken by: Angelina Ok RN,  June 21, 2007 9:12 AM    Appended Document: Potassium prescription

## 2011-01-23 NOTE — Progress Notes (Signed)
Summary: phone/gg  Phone Note Call from Patient   Caller: Patient Summary of Call: Received call from pt stating she had to go to ED 3x after going on Cipro.  She stopped the Cipro as instructed. She wanted you to be aware of what happened.  She also wants a time released med   The med-paroxetin cost $ 92 so she wants it changed to something less expensive.  Goes to [facility] on [address]. Pt # [id number] Initial call taken by: Merrie Roof RN,  April 13, 2008 4:13 PM  Follow-up for Phone Call        Paroxetine 20 mg in on the 4$ plan at [facility], so will make that dosing changing.  Will add cipro to her allergy list and inquire as to what rxn she had at her next visit.    New Allergies: ! CIPRO New/Updated Medications: PAXIL 20 MG  TABS (PAROXETINE HCL) Take one tablet by mouth in the morning. New Allergies: ! CIPRO  Prescriptions: PAXIL 20 MG  TABS (PAROXETINE HCL) Take one tablet by mouth in the morning.  #30 x 3   Entered and Authorized by:   Joaquin Courts  MD   Signed by:   Joaquin Courts  MD on 04/14/2008   Method used:   Electronically sent to ...       [address]       [address]       [place], [place]  [address]       Ph: [phone]       Fax: [phone]   RxID:   [id number]

## 2011-01-23 NOTE — Progress Notes (Signed)
Summary: phone/gg  Phone Note Call from Patient   Summary of Call: Pt states she had reaction to cipro about 2 weeks ago.  She had burning to rectal area and blood on tissue when she wiped.  Today had buning to rectal area and bright red blood on  paper.   Pt #  [id number] Initial call taken by: Merrie Roof RN,  April 20, 2008 4:48 PM  Follow-up for Phone Call        Pt very concerned and wants to go to the ED to be seen tonight. Follow-up by: Merrie Roof RN,  April 20, 2008 5:08 PM

## 2011-01-23 NOTE — Letter (Signed)
Summary: [facility]  [facility]  [address]   [place], [place] [address]   Phone: [phone]  Fax: [phone]    08/27/2010        RE:  Catherine [name]  10/31/2056   To:  [facility]   Please assist Catherine [name] with her medication copays as she is unemployed and without any funds to obtain her medications.    Please call me at [phone] and let me know the amount and we will come by to reimburse the Pharmacy immediately.    Thank you,   Dorothe Pea, LCSW Clinical Social Worker [facility] Internal Medicine

## 2011-01-23 NOTE — Progress Notes (Signed)
Summary: refill/ hla/dms  Phone Note Refill Request Message from:  Patient on July 15, 2007 2:37 PM  Refills Requested: Medication #1:  DARVOCET-N 100 100-650 MG  TABS Take 1 tablet every 6 hours as needed for headache   Last Refilled: 06/11/2007 [facility]  Initial call taken by: Marin Roberts RN,  July 15, 2007 2:37 PM  Follow-up for Phone Call        OK to refill for limited amount.  Pt needs to see PCP to discuss medications as requested by Dr. Andrey Campanile.   Follow-up by: Manning Charity MD,  July 15, 2007 2:48 PM  Additional Follow-up for Phone Call Additional follow up Details #1::        Need #to dispense please Additional Follow-up by: Henderson Cloud,  July 16, 2007 4:36 PM    Additional Follow-up for Phone Call Additional follow up Details #2::    Rx called to pharmacy Follow-up by: Henderson Cloud,  July 19, 2007 8:42 AM   Prescriptions: DARVOCET-N 100 100-650 MG  TABS (PROPOXYPHENE N-APAP) Take 1 tablet every 6 hours as needed for headache  #30 x 0   Entered and Authorized by:   Manning Charity MD   Signed by:   Manning Charity MD on 07/15/2007   Method used:   Telephoned to ...       [facility] [address]       [address]       [place], [place]  [address]       Ph:        Fax:    RxID:   [id number]

## 2011-01-23 NOTE — Progress Notes (Signed)
Summary: Wants meclizine for vertigo  Phone Note Call from Patient   Summary of Call: Wants to know if you would order her some meclizine.  Having dizziness once in awhile, sometimes the room will spin.  She has a history of vertigo. Initial call taken by: Dutch Quint RN,  January 01, 2011 10:02 AM  Follow-up for Phone Call        I see that pt has a history of vertigo will refill meclizine - in basket fax to pt's pharmacy Follow-up by: Lehman Prom FNP,  January 02, 2011 8:42 AM  Additional Follow-up for Phone Call Additional follow up Details #1::        Left message on voicemail [phone] for pt. to return call. Listed phone # disconnected.  Rx faxed to [facility] [address]  Dutch Quint RN  January 02, 2011 10:28 AM  Notified pt. of faxed Rx. Dutch Quint RN  January 02, 2011 2:26 PM     New/Updated Medications: MECLIZINE HCL 25 MG TABS (MECLIZINE HCL) One tablet by mouth daily as needed for dizziness Prescriptions: MECLIZINE HCL 25 MG TABS (MECLIZINE HCL) One tablet by mouth daily as needed for dizziness  #20 x 0   Entered and Authorized by:   Lehman Prom FNP   Signed by:   Lehman Prom FNP on 01/02/2011   Method used:   Printed then faxed to ...       [facility] (retail)       [address] [place], [place]  [address]       Ph: [phone]       Fax: [phone]   RxID:   [id number]

## 2011-01-23 NOTE — Progress Notes (Signed)
Summary: phone/gg  Phone Note Call from Patient   Caller: Patient Summary of Call: Pt called and asked if she can have her medication back or please give her something for her nerves and headache. Pt # [id number] Initial call taken by: Merrie Roof RN,  May 06, 2010 4:03 PM  Follow-up for Phone Call        Please see the many phone notes addressing this.  She should go to [facility] to address her nerve problems.  Follow-up by: Joaquin Courts  MD,  May 07, 2010 7:10 AM  Additional Follow-up for Phone Call Additional follow up Details #1::        Pt informed again Additional Follow-up by: Merrie Roof RN,  May 09, 2010 3:48 PM

## 2011-01-23 NOTE — Progress Notes (Signed)
Summary: Medication  Phone Note Call from Patient   Caller: Patient Call For: Catherine Courts  MD Summary of Call: Call from pt said that she will no longer be given the Klonopin.  Pt says that she is taking her mesdication.  Sayd that she is upset about the fax that she is being taken off of her medication.  York Spaniel that she is suppossed to go to Mental Health and she does not feel she should have to go there to get her Klonopin.  Pt is upset and thinks that she is being wrongly accused of not taking her medication.Catherine Ok RN  December 07, 2009 2:15 PM  Initial call taken by: Catherine Ok RN,  December 07, 2009 2:15 PM  Follow-up for Phone Call        pt will need to discuss with Dr Andrey Campanile.  Follow-up by: Clydie Braun MD,  December 07, 2009 3:03 PM  Additional Follow-up for Phone Call Additional follow up Details #1::        First, I did not acuse her of not taking the medication.  I have spoken w/ Fanny Bien at [facility] to discuss this issue.  I have given him the dose she is taking of the klonopin and he is going to call his toxicologist to find out if this will show up in her urine to be sure.  He is going to call me back this afternoon.  Additional Follow-up by: Catherine Courts  MD,  December 07, 2009 3:24 PM    Additional Follow-up for Phone Call Additional follow up Details #2::    I spoke w/ Tasia Catchings and he spoke w/ his toxicologist who said that it may not necessarily show up(b/c of her weight and dose) and recommended getting confirmatory testing.  I placed the order verbally and he is going to run a klonopin specific test that will show up if she is taking it.  Results should be back next week.  I will continue the klonopin at her current dose in the mean time but if the confirmatory testing is negative, will d/c.  I have called the pt and told her she can continue the klonopin at her prior dose.  Follow-up by: Catherine Courts  MD,  December 07, 2009 4:35  PM  New/Updated Medications: KLONOPIN 1 MG TABS (CLONAZEPAM) Take 1 tablet by mouth two times a day

## 2011-01-23 NOTE — Progress Notes (Signed)
Summary: refill/ hla  Phone Note Refill Request Message from:  Patient on March 15, 2008 4:51 PM  Refills Requested: Medication #1:  ANTIVERT 12.5 MG TABS 1-2 tablets every 4 hours as needed for dizziness Initial call taken by: Marin Roberts RN,  March 15, 2008 4:51 PM  Follow-up for Phone Call        Refill approved-nurse to complete Follow-up by: Joaquin Courts  MD,  March 23, 2008 11:55 AM      Prescriptions: ANTIVERT 12.5 MG TABS (MECLIZINE HCL) 1-2 tablets every 4 hours as needed for dizziness  #90 x 0   Entered and Authorized by:   Joaquin Courts  MD   Signed by:   Joaquin Courts  MD on 03/23/2008   Method used:   Electronically sent to ...       [address]*       [address]       [place], [place]  [address]       Ph: [phone]       Fax: [phone]   RxID:   [id number]

## 2011-01-23 NOTE — Assessment & Plan Note (Signed)
Summary: F/U/EST/VS   Vital Signs:  Patient profile:   55 year old female Height:      63 inches (160.02 cm) Weight:      185.1 pounds (84.14 kg) BMI:     32.91 Temp:     98.0 degrees F (36.67 degrees C) oral Pulse rate:   71 / minute BP sitting:   157 / 95  (left arm)  Vitals Entered By: Stanton Kidney Ditzler RN (February 22, 2010 10:33 AM) Is Patient Diabetic? No Pain Assessment Patient in pain? no      Nutritional Status BMI of > 30 = obese Nutritional Status Detail appetite down  Have you ever been in a relationship where you felt threatened, hurt or afraid?denies   Does patient need assistance? Functional Status Self care Ambulation Normal Comments Discuss meds and reill meds. New med makes her nausea.   Primary Care Provider:  Joaquin Courts  MD   History of Present Illness: Pt is a 55 yo female w/ past med hx below here for f/u of htn and to discuss labs done at last visit.  She has stopped all her BP meds b/c the refills ran out and she didn't want to pick up the prescriptions.  She continues to be quite upset with me about stopping her klonopin(after 3 drug screens were neg) and perseverates on this throughout our time together.  She went to the [facility] for her intake appt and they apparently noted the first available appt was in April and she said she will never go back there.  I have encouraged her to do so b/c she continues to report terrible anxiety/depressive symptoms, however does deny SI/HI.    Depression History:      The patient denies a depressed mood most of the day and a diminished interest in her usual daily activities.         Preventive Screening-Counseling & Management  Alcohol-Tobacco     Alcohol drinks/day: 0     Smoking Status: never  Caffeine-Diet-Exercise     Does Patient Exercise: no  Current Medications (verified): 1)  Coreg 6.25 Mg Tabs (Carvedilol) .... Take 1 Tablet By Mouth Two Times A Day 2)  Antivert 12.5 Mg Tabs  (Meclizine Hcl) .Marland Kitchen.. 1-2 Tablets Every 4 Hours As Needed For Dizziness 3)  Pravachol 10 Mg  Tabs (Pravastatin Sodium) .... Take 1 Tablet By Mouth Once A Day Before Bed. 4)  Nitrostat 0.4 Mg  Subl (Nitroglycerin) .... Take One Tab Under The Tongue As Needed For Severe Chest Pain.  Do Not Use More Than One A Day. 5)  Norvasc 5 Mg Tabs (Amlodipine Besylate) .... Take 1 Tablet By Mouth Once A Day 6)  Omeprazole 20 Mg Cpdr (Omeprazole) .... Take 1 Tablet By Mouth Once A Day 7)  Paxil 20 Mg Tabs (Paroxetine Hcl) .... Take 1 Tablet By Mouth Every Morning  Allergies: 1)  ! Cipro  Past History:  Past Medical History: Last updated: 12/04/2009 CAD: S/p cardiac catherization by Dr.Ganji(3/08)          Mild LAD stenosis (60%)          Ef 50-55%          Medical mangement        w/ repeat cath in 12/08 by Dr. Lynnea Ferrier: essentially normal coronary          arteries with a possible 20% mid left anterior descending lesion versus         a  myocardial bridge. A f/u myoview as an outpt was to be done which she did have done.   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  Recently laid off from her job and moved in with her mother.  Risk Factors: Smoking Status: never (02/22/2010)  Social History: Reviewed history from 09/21/2008 and no changes required. Never smoked, no alcohol, divorced.  Recently laid off from her job and moved in with her mother.  Review of Systems       as per HPI.  Physical Exam  General:  alert, oriented, obese, depressed appearing. Eyes:  anicteric Mouth:   poor dentition. Lungs:  nl respiratory effort.  Neurologic:  gait normal. Psych:  poor eye contact, depressed appearing.   Impression & Recommendations:  Problem # 1:  ANXIETY DISORDER, GENERALIZED (ICD-300.02) Unfortunately, this continues to interfere with the mgt of her other medical problems.  SHe continues to perseverate on me stopping her klonopin and is unwilling to see a mental health provider.  She has tried multiple SSRI's in the past and buspar without any relief.  I don't have a whole lot left to offer her psychiatric symptoms and continue to recommend a psychiatrist.  Will try paxil since she refuses to go.  Her updated medication list for this problem includes:    Paxil 20 Mg Tabs (Paroxetine hcl) .Marland Kitchen... Take 1 tablet by mouth every morning  Problem # 2:  ? of MULTIPLE  MYELOMA (ICD-203.00) I ordered SPEP/UPEP b/c of her hx of anemia and chronically elevated ESR and renal failure (which returned back to nl) and returned + for bence jones protein.  I spoke w/ Dr. Darrold Span on call who graciously advised me of things to order b/c it is possible she will no show to her appt w/ oncology as she has no showed to GI when she had rectal bleeding, rheum for + ANA, ESR, surgery appts, our clinic routinely, etc.  I advised her that we do not know if she has a malignancy but we need to do more tests and I emphasized the importance of going to oncology.  Orders: T-Comprehensive Metabolic Panel [id number]) T- * Misc. Laboratory test [id number]) T- * Misc. Laboratory test [id number]) T-24 Hr. Urine Total Protein [id number]) T-Urine Creatinine [id number]) T- * Misc. Laboratory test [phone]) Oncology Referral (Oncology) Radiology other (Radiology Other)  Problem # 3:  HYPERTENSION (ICD-401.9) Decompensated.  She has stopped all meds.  I have recommended her restart the ones below she was supposed to be taking without the addition of HCTZ and lisinoprl, which she was on previously, in light of her  recent worsening creatinine.    Her updated medication list for this problem includes:    Coreg 6.25 Mg Tabs (Carvedilol) .Marland Kitchen... Take 1 tablet by mouth two times a day    Norvasc 5 Mg Tabs (Amlodipine besylate) .Marland Kitchen... Take 1 tablet by mouth once a day  Problem # 4:  HYPOKALEMIA (ICD-276.8)  Did not show up for repeat labs and magnesium testing.  Will repeat and get mg today.   Orders: T-Magnesium ([id number])  Complete Medication List: 1)  Coreg 6.25 Mg Tabs (Carvedilol) .... Take 1 tablet by mouth  two times a day 2)  Antivert 12.5 Mg Tabs (Meclizine hcl) .Marland Kitchen.. 1-2 tablets every 4 hours as needed for dizziness 3)  Pravachol 10 Mg Tabs (Pravastatin sodium) .... Take 1 tablet by mouth once a day before bed. 4)  Nitrostat 0.4 Mg Subl (Nitroglycerin) .... Take one tab under the tongue as needed for severe chest pain.  do not use more than one a day. 5)  Norvasc 5 Mg Tabs (Amlodipine besylate) .... Take 1 tablet by mouth once a day 6)  Omeprazole 20 Mg Cpdr (Omeprazole) .... Take 1 tablet by mouth once a day 7)  Paxil 20 Mg Tabs (Paroxetine hcl) .... Take 1 tablet by mouth every morning  Patient Instructions: 1)  Please make a followup appointment in 2-4 weeks to check your blood pressure. 2)  Please see the oncologist and get your tests done. 3)  Please get your medications filled. 4)  Please schedule the appointment in April at [facility] Prescriptions: OMEPRAZOLE 20 MG CPDR (OMEPRAZOLE) Take 1 tablet by mouth once a day  #30 x 5   Entered and Authorized by:   Joaquin Courts  MD   Signed by:   Joaquin Courts  MD on 02/22/2010   Method used:   Print then Give to Patient   RxID:   [id number] NORVASC 5 MG TABS (AMLODIPINE BESYLATE) Take 1 tablet by mouth once a day  #30 x 5   Entered and Authorized by:   Joaquin Courts  MD   Signed by:   Joaquin Courts  MD on 02/22/2010   Method used:   Print then Give to Patient   RxID:   [id number] COREG 6.25 MG TABS  (CARVEDILOL) Take 1 tablet by mouth two times a day  #60 x 5   Entered and Authorized by:   Joaquin Courts  MD   Signed by:   Joaquin Courts  MD on 02/22/2010   Method used:   Print then Give to Patient   RxID:   [id number] PAXIL 20 MG TABS (PAROXETINE HCL) Take 1 tablet by mouth every morning  #30 x 1   Entered and Authorized by:   Joaquin Courts  MD   Signed by:   Joaquin Courts  MD on 02/22/2010   Method used:   Print then Give to Patient   RxID:   [id number]  Process Orders Check Orders Results:     [facility]: ABN not required for this insurance Tests Sent for requisitioning (February 22, 2010 11:46 AM):     02/22/2010: [facility] -- T-Comprehensive Metabolic Panel [[id number]] (signed)     02/22/2010: [facility] -- T- * Misc. Laboratory test [id number] (signed)     02/22/2010: [facility] -- T- * Misc. Laboratory test [phone] (signed)     02/22/2010: [facility] -- T-24 Hr. Urine Total Protein [id number] (signed)     02/22/2010: [facility] -- T-Urine Creatinine [[id number]] (signed)     02/22/2010: [facility] -- T- * Misc. Laboratory test [[id number]] (signed)     02/22/2010: [facility] -- T-Magnesium [[id number]] (signed)    Prevention & Chronic Care Immunizations   Influenza vaccine: Not documented   Influenza vaccine deferral: Refused  (11/07/2009)    Tetanus booster: Not documented   Td booster deferral: Deferred  (02/06/2010)    Pneumococcal vaccine: Not documented   Pneumococcal vaccine deferral: Deferred  (02/06/2010)  Colorectal Screening   Hemoccult: Not documented   Hemoccult action/deferral: Refused  (  01/23/2010)    Colonoscopy: Not documented   Colonoscopy action/deferral: Refused  (01/23/2010)  Other Screening   Pap smear:  Specimen Adequacy: Satisfactory for evaluation.   Interpretation/Result:Negative for  intraepithelial Lesion or Malignancy.     (07/20/2008)   Pap smear action/deferral: Ordered  (11/07/2009)   Pap smear due: 07/2008    Mammogram: ASSESSMENT: Negative - BI-RADS 1^MM DIGITAL SCREENING  (04/23/2009)   Smoking status: never  (02/22/2010)  Lipids   Total Cholesterol: 206  (03/28/2009)   LDL: 90  (03/28/2009)   LDL Direct: Not documented   HDL: 75  (03/28/2009)   Triglycerides: 206  (03/28/2009)    SGOT (AST): 24  (12/04/2009)   SGPT (ALT): 17  (12/04/2009) CMP ordered    Alkaline phosphatase: 73  (12/04/2009)   Total bilirubin: 0.4  (12/04/2009)  Hypertension   Last Blood Pressure: 157 / 95  (02/22/2010)   Serum creatinine: 1.09  (02/06/2010)   Serum potassium 3.1  (02/06/2010) CMP ordered   Self-Management Support :    Patient will work on the following items until the next clinic visit to reach self-care goals:     Medications and monitoring: take my medicines every day  (02/22/2010)     Eating: drink diet soda or water instead of juice or soda, eat more vegetables, use fresh or frozen vegetables, eat foods that are low in salt, eat baked foods instead of fried foods, eat fruit for snacks and desserts, limit or avoid alcohol  (02/22/2010)     Activity: take a 30 minute walk every day, take the stairs instead of the elevator, park at the far end of the parking lot  (02/22/2010)    Hypertension self-management support: Education handout, Written self-care plan, Resources for patients handout  (02/22/2010)   Hypertension self-care plan printed.   Hypertension education handout printed    Lipid self-management support: Education handout, Written self-care plan, Resources for patients handout  (02/22/2010)   Lipid self-care plan printed.   Lipid education handout printed      Resource handout printed.

## 2011-01-23 NOTE — Miscellaneous (Signed)
Summary: [employer]  [employer]   Imported By: Margie Billet 05/29/2010 11:40:16  _____________________________________________________________________  External Attachment:    Type:   Image     Comment:   External Document

## 2011-01-23 NOTE — Progress Notes (Signed)
Summary: telephone order for vicodin, please document that order was give  Phone Note Call from Patient   Summary of Call: pt states she was disch from the hospital today and needs pain med, dr Phillips Odor notified, verb order rec'd : vicodin 5/500 1 tablet every 4 hrs  as needed pain #30 no add. refills called to [facility]. Initial call taken by: Marin Roberts RN,  March 02, 2007 4:13 PM

## 2011-01-23 NOTE — Progress Notes (Signed)
Summary: med for nerves/hla  Phone Note Call from Patient   Caller: Patient Summary of Call: pt calls, leaves message, wants dr Andrey Campanile to call something in for her nerves, message is sent. Initial call taken by: Marin Roberts RN,  January 16, 2010 2:26 PM  Follow-up for Phone Call        Please see prior phone note.  I have called her numerous times concerning this issue.  Please let her know that the prozac should help her with her nerves and she should see the psychiatrist. Follow-up by: Joaquin Courts  MD,  January 16, 2010 2:59 PM

## 2011-01-23 NOTE — Progress Notes (Signed)
----   Converted from flag ---- ---- 07/21/2008 2:22 PM, Chinita Pester RN wrote: Pt.called back;informed her Dr. Sunnie Nielsen needs to speak to her. Telephone # [phone] given per pt.; number given to MD.  ---- 07/21/2008 1:13 PM, Chinita Pester RN wrote: Ardelia Mems to get in contact with pt.  I called her sisiter, Rene Kocher, at # [phone]. She said Marca does not have a telephone-it has been disconnected. I asked the sister to give Adrinne the message to call the clinic.  ---- 07/21/2008 11:45 AM, Hartley Barefoot MD wrote: hi glenda can you please contact Ms Sallee Lange. I need to speak with her regarding her labs result and a new prescription that she need to pickup from pharmacy. ------------------------------

## 2011-01-23 NOTE — Progress Notes (Signed)
  Phone Note Call from Patient   Caller: Patient Reason for Call: Refill Medication Summary of Call: Asking for Darvocet pills over the phone for headache---this is the second time I have recieved a call from her in the past 2 months after hours.Marland KitchenMarland KitchenI refused to prescribe the medication over the phone. I flagged her record. She was very angry. I advised her to call the clinic in the am to be seen and evaluated by a doctor. I informed her we do not treat headaches over the phone.  I told her if her symptoms get worse then she should go to the ER. Initial call taken by: Julaine Fusi  DO,  January 19, 2008 6:17 PM  Follow-up for Phone Call        i called the pharm after not being able to get in touch w/ the pt, she had #22 filled last night from a script on hold Follow-up by: Marin Roberts RN,  January 20, 2008 6:16 PM      I had spoken w/ the pt the day before(1/27) concerning a refill for darvocet.  She said that she had gotton her last refill on 1/12 and I told her that if that is the case, than she should still have 2 weeks worth of medication.  She said ok and then complained about our Licensed conveyancer.  I suggested that if she was having headaches that required higher doses of pain medication, that she needed to make an appointment and we would work her in.

## 2011-01-23 NOTE — Progress Notes (Signed)
Summary: refill/ hla  Phone Note Refill Request Message from:  Patient on January 12, 2009 3:29 PM  Refills Requested: Medication #1:  ANTIVERT 12.5 MG TABS 1-2 tablets every 4 hours as needed for dizziness  Medication #2:  IMITREX 50 MG TABS Take one pill for headache.  If no relief after one hour Initial call taken by: Marin Roberts RN,  January 12, 2009 3:29 PM  Follow-up for Phone Call        Refill approved-nurse to complete Follow-up by: Joaquin Courts  MD,  January 12, 2009 3:42 PM  Additional Follow-up for Phone Call Additional follow up Details #1::        Rx. refills faxed to [facility] pharmacy. Additional Follow-up by: Chinita Pester RN,  January 15, 2009 12:06 PM      Prescriptions: ANTIVERT 12.5 MG TABS (MECLIZINE HCL) 1-2 tablets every 4 hours as needed for dizziness  #90 x 0   Entered and Authorized by:   Joaquin Courts  MD   Signed by:   Joaquin Courts  MD on 01/12/2009   Method used:   Telephoned to ...       [facility] [address] (retail)             [place], [place]         Ph: [phone]       Fax:    RxID:   [id number] IMITREX 50 MG TABS (SUMATRIPTAN SUCCINATE) Take one pill for headache.  If no relief after one hour, take one more pill.  Do not take more than three pills a day.  #30 x 0   Entered and Authorized by:   Joaquin Courts  MD   Signed by:   Joaquin Courts  MD on 01/12/2009   Method used:   Telephoned to ...       [facility] [address] (retail)             [place], [place]         Ph: [phone]       Fax:    RxID:   [id number]

## 2011-01-23 NOTE — Assessment & Plan Note (Signed)
Summary: Soc. Work   Social Work Evaluation Date  12/04/2009 Patient name Catherine [name]  Primary MD   : Joaquin Courts  MD Social Worker's name : Dorothe Pea MSW- LCSW  Home [phone]  Work phone: [phone]  Cell phone: .  Marland Kitchen     Alternate phone: . Marland Kitchen       Individual making referral: Glenda  Primary Reason for Referral:   Direct Counseling and Support by Clinical Social Work  Comments The patient is known to soc. work and living with her mother.  She is without income, relies on others for transportation,  and requests bag of food today.  I have given her multiple resources in the past for her to follow up on and she has a hx of not following on those resources.   Action taken by Social Work: I met with Catherine [name] in the exam room and she looks as though she has lost a significant amount of weight since I last saw her.    My plan is to refer her to [facility] for case mgmt since she has made little progress.  I cannot do that until she brings [facility] necessary paperwork for [facility] (orange card) certification.   She still needs a current picture ID and told me she is planning on getting this by months end. Her brother brought her to the clinics today and will pick her up later. Her bros phone number is [phone].  A bag of groceries was given to her today.   SW follow-up re: [facility] referral.

## 2011-01-23 NOTE — Assessment & Plan Note (Signed)
Summary: FU/EST/VS   Vital Signs:  Patient profile:   55 year old female Height:      63 inches (160.02 cm) Weight:      202.06 pounds (91.85 kg) BMI:     35.92 Temp:     97.9 degrees F (36.61 degrees C) oral Pulse rate:   77 / minute BP sitting:   127 / 77  (right arm) Cuff size:   regular  Vitals Entered By: Angelina Ok RN (March 28, 2009 1:37 PM) Is Patient Diabetic? No Pain Assessment Patient in pain? no      Nutritional Status BMI of > 30 = obese  Have you ever been in a relationship where you felt threatened, hurt or afraid?No   Does patient need assistance? Functional Status Self care Ambulation Normal Comments Having sweats since starting Prednisone.  Palpitations ans short of breath.  Needs something different for nerves.  Wants to come off the Prednisone.  Needs refills on med.   Primary Care Provider:  Joaquin Courts  MD   History of Present Illness: Pt is a 55 yo female w/ PMHX below here for f/u of her elevated BP.  She notes a lot of personal stress recently.  She has palpitations occasionally and chest pain associated w/ SOB and sweating spells.  These are exactly the same as what she has had previously.  None of this is exertional.  She notes she had a normal stress treadmill test + two caths.  She has no recollection of ever feeling depressed however she looks markedly depressed every time I have seen her.  However, her anixety is really bothering her.  She is still upset b/c she lost her job, then lost her appt and is staying w/ her mother.  All of her above signs's have been worse since losing her home.  She thinks the klonopin helps but only for about 30 minutes.  She has been taking more of the klonpin than has been prescribed and knows she shouldn't do this.  She endorses anhedonia, sleeping difficulty, fatigue/lack of energy, "a little" depression.  She denies problems w/ her appetite/weight changes.  She is absolutely unwilling to go to a  psychiatrist or councellor of any kind.   Of note, her HA's are better.  However, her teeth still hurts and she did not go to her dentist appts.  However, she does note RUQ pain, intermittently, LUQ pain intermittently, and L neck pain not related to any other of the above symptoms.  Preventive Screening-Counseling & Management     Smoking Status: never     Does Patient Exercise: no  Current Medications (verified): 1)  Coreg 6.25 Mg Tabs (Carvedilol) .... Take 1 Tablet By Mouth Two Times A Day 2)  Benazepril Hcl 40 Mg Tabs (Benazepril Hcl) .... Take 1 Tablet By Mouth Once A Day For Blood Pressure. Take This After Finishing The Lisinopril 3)  Antivert 12.5 Mg Tabs (Meclizine Hcl) .Marland Kitchen.. 1-2 Tablets Every 4 Hours As Needed For Dizziness 4)  Klonopin 1 Mg Tabs (Clonazepam) .... Take 1 Tablet By Mouth Two Times A Day 5)  Maxzide-25 37.5-25 Mg  Tabs (Triamterene-Hctz) .... Take One Tablet By Mouth Daily. 6)  Pravachol 10 Mg  Tabs (Pravastatin Sodium) .... Take 1 Tablet By Mouth Once A Day Before Bed. 7)  Darvocet-N 100 100-650 Mg Tabs (Propoxyphene N-Apap) .... Take One Tablet By Mouth Every Six Hours As Needed For Pain. 8)  Nitrostat 0.4 Mg  Subl (Nitroglycerin) .... Take  One Tab Under The Tongue As Needed For Severe Chest Pain.  Do Not Use More Than One A Day. 9)  Prednisone 10 Mg Tabs (Prednisone) .... Take 1 Tablet By Mouth Once A Day 10)  Prozac 40 Mg Caps (Fluoxetine Hcl) .... Take 1 Tablet By Mouth Once A Day 11)  Norvasc 5 Mg Tabs (Amlodipine Besylate) .... Take 1 Tablet By Mouth Once A Day 12)  Omeprazole 20 Mg Cpdr (Omeprazole) .... Take 1 Tablet By Mouth Once A Day  Allergies (verified): 1)  ! Cipro  Past History:  Past Medical History:    CAD: S/p cardiac catherization by Dr.Ganji(3/08)             Mild LAD stenosis (60%)             Ef 50-55%             Medical mangement           w/ repeat cath in 12/08 by Dr. Lynnea Ferrier: essentially normal coronary             arteries with a  possible 20% mid left anterior descending lesion versus            a myocardial bridge. A f/u myoview as an outpt was to be done which she did have done.      Hypertension    White matter disease, brainstem auditory evoked response test normal 01/2004    Dental caries with gingivitis    BPPV    Chronic dizziness, evaluated by neuro    Insomnia    Generalized anxiety disorder with hx of Valium dependence    FOBT + stools    elevated D dimer with multiple negative CTAs     (08/25/2008)  Past Surgical History:    Tubal ligation     (05/11/2007)  Social History:    Never smoked, no alcohol, divorced.  Recently laid off from her job. (09/21/2008)  Risk Factors:    Smoking Status: never (03/28/2009)    Packs/Day: N/A    Cigars/wk: N/A    Pipe Use/wk: N/A    Cans of tobacco/wk: N/A    Passive Smoke Exposure: N/A  Social History:    Reviewed history from 09/21/2008 and no changes required:       Never smoked, no alcohol, divorced.  Recently laid off from her job.  Review of Systems       As per HPI.  Physical Exam  General:  alert, depressed appearing, no distress. Eyes:  anicteric. Mouth:  poor dentition, no thursh. Neck:  no LAD, JVD. Lungs:  CTA bilaterally, normal effort. Heart:  RRR, no m/r/g. Abdomen:  obese, soft, NT and ND. Extremities:  no edema. Psych:  depressed appearing, poor eye contact, memory intact.     Impression & Recommendations:  Problem # 1:  CHEST PAIN, ATYPICAL (ICD-786.59) I have seen her on multiple occasions w/ the same symptoms and think this is likely anxiety related.  I do not think this is cardiac b/c it is atypical and she has had mult cardiac workups.  Will place on PPI in case this is GI in etiology and will aggressively tx her psychiatric symptoms.   Problem # 2:  SYMPTOM, HEADACHE (ICD-784.0) She has been tx'g for presumed temporal arteritis, however, her ESR didn't decrease significantly and neither did her symptoms.  We are  currently working on tapering the prednisone, so will decrease again.  For the first time in a long time she notes  her HA's are some better, but this may partially be b/c she has many other somatic complaints.   The following medications were removed from the medication list:    Imitrex 50 Mg Tabs (Sumatriptan succinate) .Marland Kitchen... Take one pill for headache.  if no relief after one hour, take one more pill.  do not take more than three pills a day. Her updated medication list for this problem includes:    Coreg 6.25 Mg Tabs (Carvedilol) .Marland Kitchen... Take 1 tablet by mouth two times a day    Darvocet-n 100 100-650 Mg Tabs (Propoxyphene n-apap) .Marland Kitchen... Take one tablet by mouth every six hours as needed for pain.  Problem # 3:  HYPERTENSION (ICD-401.9)  BP improved today.  Cont current tx.  F/u lytes/cr today.  Her updated medication list for this problem includes:    Coreg 6.25 Mg Tabs (Carvedilol) .Marland Kitchen... Take 1 tablet by mouth two times a day    Benazepril Hcl 40 Mg Tabs (Benazepril hcl) .Marland Kitchen... Take 1 tablet by mouth once a day for blood pressure. take this after finishing the lisinopril    Maxzide-25 37.5-25 Mg Tabs (Triamterene-hctz) .Marland Kitchen... Take one tablet by mouth daily.    Norvasc 5 Mg Tabs (Amlodipine besylate) .Marland Kitchen... Take 1 tablet by mouth once a day  BP today: 127/77 Prior BP: 164/102 (02/27/2009)  Labs Reviewed: K+: 3.6 (12/25/2008) Creat: : 1.02 (12/25/2008)   Chol: 220 (10/19/2007)   HDL: 40 (10/19/2007)   LDL: 113 (10/19/2007)   TG: 334 (10/19/2007)  Problem # 4:  ANXIETY DISORDER, GENERALIZED (ICD-300.02) Pt appears to have panic attacks and depressive component.  Started SSRI.  She has obviously overused by quite a bit the klonopin but will go ahead and refill today b/c I don't want her to withdrawl.  However, I made it perfectly clear that I won't do that again and that if she is requiring more than what is prescribed, than she should call us for an appt.  Will increase her SSRI dosing to see  if this helps w/ her symptoms, as well.  This is a difficult situation b/c she has refused psychiatry and any councelling referral and gets very angry at even the suggestion yelling "she's not crazy."  However, will get SW services to see if they can help her w/ anything b/c she has a difficult social situation presently.  Her updated medication list for this problem includes:    Klonopin 1 Mg Tabs (Clonazepam) .Marland Kitchen... Take 1 tablet by mouth two times a day    Prozac 40 Mg Caps (Fluoxetine hcl) .Marland Kitchen... Take 1 tablet by mouth once a day  Orders: T-TSH ([id number])  Problem # 5:  SEDIMENTATION RATE, ELEVATED (ICD-790.1) This may be from her untx'd dental dz.  However, given that her ANA was positive (only 1:40), will check ds DNA and anti-Smith.  Will also repeat ESR and check CRP.  Orders: T-Antinuclear Antib (ANA) [id number]) T-C-Reactive Protein ([id number]) T-Sed Rate (Automated) [phone]) T- * Misc. Laboratory test [id number]) T- * Misc. Laboratory test [phone])  Problem # 6:  ABSCESS, TOOTH (ICD-522.5) She no-showed to her mult dental appts and did not take her abx.  I told her that I don't think there is much else I can do.  Hopefully aggressively tx'g her psych issues will help.   Problem # 7:  HYPERLIPIDEMIA (ICD-272.4) Lipids/CMET today.  Her updated medication list for this problem includes:    Pravachol 10 Mg Tabs (Pravastatin sodium) .Marland Kitchen... Take 1 tablet by mouth once a day  before bed.  Orders: T-Lipid Profile ([id number]) T-Comprehensive Metabolic Panel ([id number])  Problem # 8:  HEALTH MAINTENANCE EXAM (ICD-V70.0) Placed order for mammogram but she has no-showed to her prior appts.  Emphasized the importance of this.  Pap done by me in 06/2008, repeat 07/10.  No showed to her GI appts for colonoscopy.  Orders: Mammogram (Screening) (Mammo)  Complete Medication List: 1)  Coreg 6.25 Mg Tabs (Carvedilol) .... Take 1 tablet by mouth two times a day 2)  Benazepril Hcl  40 Mg Tabs (Benazepril hcl) .... Take 1 tablet by mouth once a day for blood pressure. take this after finishing the lisinopril 3)  Antivert 12.5 Mg Tabs (Meclizine hcl) .Marland Kitchen.. 1-2 tablets every 4 hours as needed for dizziness 4)  Klonopin 1 Mg Tabs (Clonazepam) .... Take 1 tablet by mouth two times a day 5)  Maxzide-25 37.5-25 Mg Tabs (Triamterene-hctz) .... Take one tablet by mouth daily. 6)  Pravachol 10 Mg Tabs (Pravastatin sodium) .... Take 1 tablet by mouth once a day before bed. 7)  Darvocet-n 100 100-650 Mg Tabs (Propoxyphene n-apap) .... Take one tablet by mouth every six hours as needed for pain. 8)  Nitrostat 0.4 Mg Subl (Nitroglycerin) .... Take one tab under the tongue as needed for severe chest pain.  do not use more than one a day. 9)  Prednisone 10 Mg Tabs (Prednisone) .... Take 1 tablet by mouth once a day 10)  Prozac 40 Mg Caps (Fluoxetine hcl) .... Take 1 tablet by mouth once a day 11)  Norvasc 5 Mg Tabs (Amlodipine besylate) .... Take 1 tablet by mouth once a day 12)  Omeprazole 20 Mg Cpdr (Omeprazole) .... Take 1 tablet by mouth once a day  Patient Instructions: 1)  Please make a followup appointment in one month to followup your symptoms. 2)  Please increase prozac to 40 mg a day. 3)  Please decrease prednisone to 10 mg a day. 4)  Please get your mammogram done.   5)  You will be called with any abnormal labwork.  Please make sure your phone number is correct at the front desk. 6)  Please call to speak with Lupita Leash T. to help with your anxiety. 7)  Please call sooner if you need anything. Prescriptions: KLONOPIN 1 MG TABS (CLONAZEPAM) Take 1 tablet by mouth two times a day  #60 x 3   Entered and Authorized by:   Joaquin Courts  MD   Signed by:   Joaquin Courts  MD on 03/28/2009   Method used:   Print then Give to Patient   RxID:   [id number] OMEPRAZOLE 20 MG CPDR (OMEPRAZOLE) Take 1 tablet by mouth once a day  #30 x 0   Entered and Authorized by:   Joaquin Courts   MD   Signed by:   Joaquin Courts  MD on 03/28/2009   Method used:   Print then Give to Patient   RxID:   [id number] PROZAC 40 MG CAPS (FLUOXETINE HCL) Take 1 tablet by mouth once a day  #30 x 1   Entered and Authorized by:   Joaquin Courts  MD   Signed by:   Joaquin Courts  MD on 03/28/2009   Method used:   Print then Give to Patient   RxID:   [id number] PREDNISONE 10 MG TABS (PREDNISONE) Take 1 tablet by mouth once a day  #30 x 0   Entered and Authorized by:   Joaquin Courts  MD   Signed by:  Joaquin Courts  MD on 03/28/2009   Method used:   Print then Give to Patient   RxID:   [id number]

## 2011-01-23 NOTE — Progress Notes (Signed)
Summary: BP meds  Phone Note Refill Request   Refills Requested: Medication #1:  NORVASC 10 MG TABS One tablet by mouth daily for blood pressure **note change in dose**  Medication #2:  CARVEDILOL 6.25 MG TABS One tablet by mouth two times a day Initial call taken by: [name],  January 14, 2011 12:19 PM  Follow-up for Phone Call        Refills completed - sent to [facility] [address] per pt. request.  Dutch Quint RN  January 14, 2011 12:52 PM     Prescriptions: CARVEDILOL 6.25 MG TABS (CARVEDILOL) One tablet by mouth two times a day  #60 x 3   Entered by:   Dutch Quint RN   Authorized by:   Lehman Prom FNP   Signed by:   Dutch Quint RN on 01/14/2011   Method used:   Electronically to        [facility] [phone]* (retail)       [address]       [place], [place]  [address]       Ph: [phone]       Fax: [phone]   RxID:   [id number] NORVASC 10 MG TABS (AMLODIPINE BESYLATE) One tablet by mouth daily for blood pressure **note change in dose**  #30 x 3   Entered by:   Dutch Quint RN   Authorized by:   Lehman Prom FNP   Signed by:   Dutch Quint RN on 01/14/2011   Method used:   Electronically to        [facility] [phone]* (retail)       [address]       [place], [place]  [address]       Ph: [phone]       Fax: [phone]   RxID:   [id number]

## 2011-01-23 NOTE — Progress Notes (Signed)
Summary: meds refill for headaches  Phone Note Call from Patient   Summary of Call: please  call patient to [phone] need to know what are ypu going to give her for headaches. Initial call taken by: Domenic Polite,  December 18, 2010 11:50 AM  Follow-up for Phone Call        Call [facility] and see if they have sedapap or any other butalbital medication for headaches Follow-up by: Lehman Prom FNP,  December 18, 2010 12:30 PM  Additional Follow-up for Phone Call Additional follow up Details #1::        Left message for someone at [facility] to return call.  Dutch Quint RN  December 18, 2010 2:51 PM  Spoke with Crystal at [facility] - they do not stock anything except NSAIDS and muscle relaxants.  There are a few medications typically for migraines that can be ordered through [facility] -- Maxalt, Emerge, Frova, Relpax, Axert.   Butalbital and acetominophen tablets have a program (Bupap tablets).  But these may take up to 8 weeks to obtain once she's enrolled in the program.  If this is not long-term, it may not be worth it for the pt. to enroll. Additional Follow-up by: Dutch Quint RN,  December 20, 2010 9:48 AM    Additional Follow-up for Phone Call Additional follow up Details #2::    Will give pt generic medication for headache advise pt that this is NOT available at [facility] she can get it from the pharmacy that she gets her klonopin (rx in basket) Follow-up by: Lehman Prom FNP,  December 20, 2010 10:51 AM  Additional Follow-up for Phone Call Additional follow up Details #3:: Details for Additional Follow-up Action Taken: Left message on answering machine for pt. to return call.  Dutch Quint RN  December 20, 2010 12:19 PM  Pt. notified re new Rx -- faxed to [facility] [address] per pt. request.  Dutch Quint RN  December 20, 2010 12:41 PM   New/Updated Medications: BUTALBITAL-APAP-CAFFEINE 50-325-40 MG CAPS (BUTALBITAL-APAP-CAFFEINE) One tablet by mouth daily as needed for  headache Prescriptions: BUTALBITAL-APAP-CAFFEINE 50-325-40 MG CAPS (BUTALBITAL-APAP-CAFFEINE) One tablet by mouth daily as needed for headache  #30 x 0   Entered and Authorized by:   Lehman Prom FNP   Signed by:   Lehman Prom FNP on 12/20/2010   Method used:   Printed then faxed to ...       [facility] (retail)       [address] [place], [place]  [address]       Ph: [phone]       Fax: [phone]   RxID:   [id number]

## 2011-01-23 NOTE — Miscellaneous (Signed)
Summary: HIPAA Restrictions  HIPAA Restrictions   Imported By: Florinda Marker 08/25/2008 14:38:13  _____________________________________________________________________  External Attachment:    Type:   Image     Comment:   External Document

## 2011-01-23 NOTE — Miscellaneous (Signed)
Summary: [facility]  [place] [facility]   Imported By: Florinda Marker 04/30/2009 14:19:59  _____________________________________________________________________  External Attachment:    Type:   Image     Comment:   External Document

## 2011-01-23 NOTE — Progress Notes (Signed)
  Phone Note Call from Patient Call back at [phone]   Caller: Patient Summary of Call: pt wants to know why the denied her meds Initial call taken by: Leodis Rains,  September 23, 2010 4:08 PM  Follow-up for Phone Call        tried calling pt but no answer .Marland KitchenMarland KitchenArmenia [name]  September 23, 2010 4:34 PM   spoke with pt and informed her about her coming to appt for elevaluation on her headaches... pt made another appt Follow-up by: Catherine [name],  September 24, 2010 4:21 PM

## 2011-01-23 NOTE — Progress Notes (Signed)
Summary: phone/gg      Phone Note Call from Patient   Caller: Patient Summary of Call: Pt called for meds for headache.  She states she asked about this on last visit.  She has tried otc meds without relief. Is there anything else she can take. Also should she be taking pravachol 10 Mg ?  if so will need  Rx Initial call taken by: Merrie Roof RN,  September 02, 2010 2:32 PM  Follow-up for Phone Call        As we discussed at length, I cannot give her anything stronger for pain. I do want her on her pravastatin and her omeprazole but she did not have money to pick them up the other day. Follow-up by: Zoila Shutter MD,  September 02, 2010 3:41 PM  Additional Follow-up for Phone Call Additional follow up Details #1::        Pt informed of above She is upset because she wants pain meds. She asked me to tell Dr Coralee Pesa to " Put it where the sun don't shine" Pt then hung up.  Additional Follow-up by: Merrie Roof RN,  September 02, 2010 4:26 PM    Prescriptions: OMEPRAZOLE 40 MG CPDR (OMEPRAZOLE) Take 1 tablet by mouth once a day  #30 x 6   Entered and Authorized by:   Zoila Shutter MD   Signed by:   Zoila Shutter MD on 09/02/2010   Method used:   Faxed to ...       [facility] (retail)       [address] [place], [place]  [address]       Ph: [phone]       Fax: [phone]   RxID:   [id number] PRAVACHOL 10 MG  TABS (PRAVASTATIN SODIUM) Take 1 tablet by mouth once a day before bed.  #30 x 6   Entered and Authorized by:   Zoila Shutter MD   Signed by:   Zoila Shutter MD on 09/02/2010   Method used:   Faxed to ...       [facility] Department (retail)       [address] [place], [place]  [address]       Ph: [phone]       Fax: [phone]   RxID:   [id number]   Appended Document: phone/gg     Noted.

## 2011-01-23 NOTE — Progress Notes (Signed)
Summary: call requesting darvocet/ hla  Phone Note Call from Patient   Caller: Patient Summary of Call: pt has called 12/2/and 12/3 asking why she cannot get some darvocet, the reason was explained and she was encouraged to keep her appt 12/9 w/ dr Andrey Campanile,  she then stated she knew what she needed and didn't need and should have say so, when ask to explain she stated it had been taken wrong that she thought she was on too much medicine and she needed her darvocet. both the calls were basically the same and both times it was explained that the pt needed to keep her appt and voice her concerns to dr Andrey Campanile. Initial call taken by: Marin Roberts RN,  November 23, 2008 1:34 PM  Follow-up for Phone Call        Thank you for handling this.  I've spoken with her about this on numerous occasions and will be happy to discuss this again with her at her appt this coming week.   Follow-up by: Joaquin Courts  MD,  November 24, 2008 6:40 AM

## 2011-01-23 NOTE — Letter (Signed)
Summary: [facility]PROGRESS NOTE  [facility]PRO[facility]ESS [facility]   Imported By: Arta Bruce 11/20/2010 09:55:20  _____________________________________________________________________  External Attachment:    Type:   Image     Comment:   External Document

## 2011-01-23 NOTE — Initial Assessments (Signed)
INTERNAL MEDICINE ADMISSION HISTORY AND PHYSICAL  First Contact: Dr. Arvilla Market [phone] Second Contact:Dr. Comer Locket [phone]  PCP: Dr. Coralee Pesa  UX:LKGMW pain  HPI:[name] is a 55 year old woman with pmh significant for CAD, s/p cath in 2008 which showed 60% stenosis of LAD treated with aggressive medical management and risk stratification, HTN, and anxiety disorder presents to the ED for atypical chest pain. Patient reports her chest pain began morning of admission, acute in onset, pressure like, 10/10 in intensity, associated with diaphoresis and nausea. She also mentions she was short of breath. The pain is constant. Patient has extensive history of anxiety, and recently stopped receiving anxiety medication due to violation of pain contract. Patient mentions increased level on anxiety related to her mother's illness. No cough, abdominal pain, vomiting, further nausea, changes in bowel habits, or urinary habits, no associated headache, fever or chills.   ALLERGIES:  ! CIPRO   PAST MEDICAL HISTORY:  CAD: S/p cardiac catherization by Dr.Ganji(3/08)          Mild LAD stenosis (60%)          Ef 50-55%          Medical mangement        w/ repeat cath in 12/08 by Dr. Lynnea Ferrier: essentially normal coronary          arteries with a possible 20% mid left anterior descending lesion versus         a myocardial bridge. A f/u myoview as an outpt was to be done which she did have done.   ? 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 Gaylyn Rong Hypertension White matter disease, brainstem auditory evoked response test normal 01/2004 Dental caries with gingivitis BPPV Chronic dizziness, evaluated by neuro Insomnia Generalized anxiety disorder with hx of Valium dependence FOBT + stools-no showed to all GI appts elevated D dimer with multiple negative CTAs   MEDICATIONS:   COREG 6.25 MG TABS (CARVEDILOL) Take 1 tablet by mouth two times a day ANTIVERT 12.5 MG TABS (MECLIZINE HCL) 1-2 tablets every 4 hours as needed for  dizziness PRAVACHOL 10 MG  TABS (PRAVASTATIN SODIUM) Take 1 tablet by mouth once a day before bed. NITROSTAT 0.4 MG  SUBL (NITROGLYCERIN) Take one tab under the tongue as needed for severe chest pain.  Do not use more than one a day. NORVASC 5 MG TABS (AMLODIPINE BESYLATE) Take 1 tablet by mouth once a day OMEPRAZOLE 20 MG CPDR (OMEPRAZOLE) Take 1 tablet by mouth once a day   SOCIAL HISTORY: Social History: Never smoked, no alcohol, divorced.  Recently laid off from her job and moved in with her mother.   FAMILY HISTORY Family History: Mother, son, and brother with htn.   No family hx of MI, DM, or breast cancer.     ROS: As per HPI  VITALS: T: 98.3 P:83  BP:166/106  R: 12 O2SAT:96%  ON:RA  PHYSICAL EXAM: General:  alert, well-developed, and cooperative to examination.   Head:  normocephalic and atraumatic.   Eyes:  vision grossly intact, pupils equal, pupils round, pupils reactive to light, no injection and anicteric.   Mouth:  pharynx pink and moist, no erythema, and no exudates.   Neck:  supple, full ROM, no thyromegaly, no JVD, and no carotid bruits.   Lungs:  normal respiratory effort, no accessory muscle use, normal breath sounds, no crackles, and no wheezes.  Heart:  normal rate, regular rhythm, no murmur, no gallop, and no rub.   Abdomen:  soft, non-tender, normal  bowel sounds, no distention, no guarding, no rebound tenderness, no hepatomegaly, and no splenomegaly.   Msk:  no joint swelling, no joint warmth, and no redness over joints.   Pulses:  2+ DP/PT pulses bilaterally Extremities:  No cyanosis, clubbing, edema  Neurologic:  alert & oriented X3, cranial nerves II-XII intact, strength normal in all extremities, sensation intact to light touch, and gait normal.   Skin:  turgor normal and no rashes.   Psych:  Oriented X3, memory intact for recent and remote, normally interactive, good eye contact, not anxious appearing, and not depressed  appearing.   LABS:   TCO2                                      28                0-100            mmol/L  Ionized Calcium                          1.25              1.12-1.32        mmol/L  Hemoglobin (HGB)                         15.3       h      12.0-15.0        g/dL  Hematocrit (HCT)                         45.0              36.0-46.0        %  Sodium (NA)                              140               135-145          mEq/L  Potassium (K)                            3.2        l      3.5-5.1          mEq/L  Chloride                                 103               96-112           mEq/L  Glucose                                  87                70-99            mg/dL  BUN  11                6-23             mg/dL  Creatinine                               1.0               0.4-1.2          mg/dL  CKMB, POC                                1.1               1.0-8.0          ng/mL  Troponin I, POC                          <0.05             0.00-0.09        ng/mL  Myoglobin, POC                           149               12-200           ng/mL  WBC                                      4.4               4.0-10.5         K/uL  RBC                                      4.92              3.87-5.11        MIL/uL  Hemoglobin (HGB)                         13.8              12.0-15.0        g/dL  Hematocrit (HCT)                         40.8              36.0-46.0        %  MCV                                      82.9              78.0-100.0       fL  MCHC                                     33.9              30.0-36.0  g/dL  RDW                                      13.8              11.5-15.5        %  Platelet Count (PLT)                     304               150-400          K/uL  Neutrophils, %                           64                43-77            %  Lymphocytes, %                           25                12-46            %  Monocytes, %                             10                 3-12             %  Eosinophils, %                           1                 0-5              %  Basophils, %                             0                 0-1              %  Neutrophils, Absolute                    2.8               1.7-7.7          K/uL  Lymphocytes, Absolute                    1.1               0.7-4.0          K/uL  Monocytes, Absolute                      0.4               0.1-1.0          K/uL  Eosinophils, Absolute                    0.0               0.0-0.7  K/uL  Basophils, Absolute                      0.0               0.0-0.1          K/uL   Color, Urine                             YELLOW            YELLOW  Appearance                               CLEAR             CLEAR  Specific Gravity                         1.009             1.005-1.030  pH                                       7.0               5.0-8.0  Urine Glucose                            NEGATIVE          NEG              mg/dL  Bilirubin                                NEGATIVE          NEG  Ketones                                  NEGATIVE          NEG              mg/dL  Blood                                    NEGATIVE          NEG  Protein                                  30         a      NEG              mg/dL  Urobilinogen                             0.2               0.0-1.0          mg/dL  Nitrite  NEGATIVE          NEG  Leukocytes                               TRACE      a      NEG  Squamous Epithelial / LPF                FEW        a      RARE  WBC / HPF                                3-6               <3               WBC/hpf  RBC / HPF                                0-2               <3               RBC/hpf  Bacteria / HPF                           RARE              RARE   IMPRESSION:   No active disease.  No significant change.   ASSESSMENT AND PLAN:  1) Chest pain - differential including ACS in patient 60% stenosis of LAD in  2008 and other risk factors such as HLD and HTN. Other causes such as GERD, anxiety with panic attack, and vasospasm are on differential. Patient has extensive history of anxiety and was recently not started back on benzo due to noncompliance to pain contract.   Plan: -Admit to telemetry -Check serial EKG -Rule out ACS with CEs  -Cardiology consult pending CEs results if ruling in -Pt did not follow with cardiology regarding myoview stress test -Consider cardiology consult to perform myoview as outpatient -Repeat 2D Echo -Risk stratify with aspirin, FLP checked in April 2011 (HDL 50, LDL 93, total cholesterol 782), resume statin  -GI Cocktail and protonix for GERD -Check UDS   2) DEPRESSION/ANXIETY:Discussed at length that patient will not be restarted on Klonipin and this matter will be further discussed with PCP. Will give patient Ativan as needed.  3) HTN - Blood pressure elevated on admission, trending down now. Will resume home meds and continue to monitor.   4) VTE PROPH: lovenox  Attending Physician: I performed and/or observed a history and physical examination of the patient.  I discussed the case with the residents as noted and reviewed the residents' notes.  I agree with the findings and plan--please refer to the attending physician note for more details. Signature Printed Name

## 2011-01-27 ENCOUNTER — Encounter (INDEPENDENT_AMBULATORY_CARE_PROVIDER_SITE_OTHER): Payer: Self-pay | Admitting: Nurse Practitioner

## 2011-01-27 ENCOUNTER — Telehealth (INDEPENDENT_AMBULATORY_CARE_PROVIDER_SITE_OTHER): Payer: Self-pay | Admitting: Nurse Practitioner

## 2011-02-06 NOTE — Letter (Signed)
Summary: REQUESTED MED LIST  REQUESTED MED LIST   Imported By: Arta Bruce 01/27/2011 15:59:14  _____________________________________________________________________  External Attachment:    Type:   Image     Comment:   External Document

## 2011-02-06 NOTE — Progress Notes (Signed)
Summary: Refill clonazepam  Phone Note Refill Request   Refills Requested: Medication #1:  CLONAZEPAM 1 MG TABS One tablet by mouth daily as needed for nerves [facility] [address]  Initial call taken by: [name],  January 27, 2011 11:21 AM  Follow-up for Phone Call        Rx printed - notify pt to check at the pharmacy Follow-up by: Lehman Prom FNP,  January 27, 2011 12:26 PM  Additional Follow-up for Phone Call Additional follow up Details #1::        Phone temporarily d/c'd.  Dutch Quint RN  January 29, 2011 2:47 PM  Just checking to be sure you checked [facility] for the most up to date number. If so, then ok to sign off note and hope pt will check at the pharmacy or call this office back  n.[email or web address],fnp  January 30, 2011  9:39 AM  Number in [place] was incorrect.  Number in both systems now correct.  Pt. notified of Rx.  Dutch Quint RN  January 30, 2011 11:22 AM       Prescriptions: CLONAZEPAM 1 MG TABS (CLONAZEPAM) One tablet by mouth daily as needed for nerves  #30 x 0   Entered and Authorized by:   Lehman Prom FNP   Signed by:   Lehman Prom FNP on 01/29/2011   Method used:   Printed then faxed to ...       [facility] [address] [phone]* (retail)       [address]       [place], [place]  [address]       Ph: [phone]       Fax: [phone]   RxID:   [id number]

## 2011-02-07 ENCOUNTER — Inpatient Hospital Stay (INDEPENDENT_AMBULATORY_CARE_PROVIDER_SITE_OTHER)
Admission: RE | Admit: 2011-02-07 | Discharge: 2011-02-07 | Disposition: A | Discharge status: Home / Self Care | Payer: Self-pay | Source: Ambulatory Visit | Attending: Family Medicine | Admitting: Family Medicine

## 2011-02-07 DIAGNOSIS — N760 Acute vaginitis: Secondary | ICD-10-CM

## 2011-02-07 LAB — POCT URINALYSIS DIPSTICK
Bilirubin Urine: NEGATIVE
Hgb urine dipstick: NEGATIVE
Ketones, ur: NEGATIVE mg/dL
Nitrite: NEGATIVE
Protein, ur: NEGATIVE mg/dL
Specific Gravity, Urine: 1.01 (ref 1.005–1.030)
Urine Glucose, Fasting: NEGATIVE mg/dL
Urobilinogen, UA: 0.2 mg/dL (ref 0.0–1.0)
pH: 7 (ref 5.0–8.0)

## 2011-02-07 LAB — WET PREP, GENITAL
Clue Cells Wet Prep HPF POC: NONE SEEN
Yeast Wet Prep HPF POC: NONE SEEN

## 2011-02-08 LAB — GC/CHLAMYDIA PROBE AMP, GENITAL
Chlamydia, DNA Probe: NEGATIVE
GC Probe Amp, Genital: NEGATIVE

## 2011-02-11 ENCOUNTER — Telehealth (INDEPENDENT_AMBULATORY_CARE_PROVIDER_SITE_OTHER): Payer: Self-pay | Admitting: Nurse Practitioner

## 2011-02-17 ENCOUNTER — Telehealth (INDEPENDENT_AMBULATORY_CARE_PROVIDER_SITE_OTHER): Payer: Self-pay | Admitting: Nurse Practitioner

## 2011-02-21 ENCOUNTER — Emergency Department (HOSPITAL_COMMUNITY): Payer: Medicare Other

## 2011-02-21 ENCOUNTER — Emergency Department (HOSPITAL_COMMUNITY)
Admission: EM | Admit: 2011-02-21 | Discharge: 2011-02-22 | Disposition: A | Discharge status: Home / Self Care | Payer: Medicare Other | Attending: Emergency Medicine | Admitting: Emergency Medicine

## 2011-02-21 DIAGNOSIS — E789 Disorder of lipoprotein metabolism, unspecified: Secondary | ICD-10-CM | POA: Insufficient documentation

## 2011-02-21 DIAGNOSIS — Z79899 Other long term (current) drug therapy: Secondary | ICD-10-CM | POA: Insufficient documentation

## 2011-02-21 DIAGNOSIS — R079 Chest pain, unspecified: Secondary | ICD-10-CM | POA: Insufficient documentation

## 2011-02-21 DIAGNOSIS — I2510 Atherosclerotic heart disease of native coronary artery without angina pectoris: Secondary | ICD-10-CM | POA: Insufficient documentation

## 2011-02-21 DIAGNOSIS — F411 Generalized anxiety disorder: Secondary | ICD-10-CM | POA: Insufficient documentation

## 2011-02-21 DIAGNOSIS — R109 Unspecified abdominal pain: Secondary | ICD-10-CM | POA: Insufficient documentation

## 2011-02-21 DIAGNOSIS — I10 Essential (primary) hypertension: Secondary | ICD-10-CM | POA: Insufficient documentation

## 2011-02-21 LAB — URINALYSIS, ROUTINE W REFLEX MICROSCOPIC
Bilirubin Urine: NEGATIVE
Glucose, UA: NEGATIVE mg/dL
Hgb urine dipstick: NEGATIVE
Ketones, ur: NEGATIVE mg/dL
Nitrite: NEGATIVE
Protein, ur: NEGATIVE mg/dL
Specific Gravity, Urine: 1.012 (ref 1.005–1.030)
Urobilinogen, UA: 0.2 mg/dL (ref 0.0–1.0)
pH: 6 (ref 5.0–8.0)

## 2011-02-25 ENCOUNTER — Telehealth (INDEPENDENT_AMBULATORY_CARE_PROVIDER_SITE_OTHER): Payer: Self-pay | Admitting: Nurse Practitioner

## 2011-02-27 NOTE — Progress Notes (Signed)
Summary: Wants meds early  Phone Note Call from Patient Call back at Home Phone [phone]   Reason for Call: Refill Medication Summary of Call: Patiet wants to know if she can get her klonipin early or does she have to come in?  Pls call asap Initial call taken by: Ayesha Rumpf,  February 17, 2011 3:46 PM  Follow-up for Phone Call        Neither - her Klonopin is due 1 month from the last date it was dispensed which was 01/29/2011.   she can get it on 02/27/2011 - it can be sent directly to her pharmacy. Verify where she wants it sent No need to schedule an appt with provider. I have reviewed this with pt several times before including during her last visit. NO EARLY REFILLS.  She will have to wait until 02/27/2011 Follow-up by: Lehman Prom FNP,  February 18, 2011 8:13 AM  Additional Follow-up for Phone Call Additional follow up Details #1::        Left message on answering machine for pt. to return call.  Dutch Quint RN  February 18, 2011 9:55 AM  Uses [facility] [address]  Pt. verbalized understanding re date of refills.    Pt. states she was accused of forging her mother's name to documents because she didn't get them notarized - this has occurred on two occasions.  Staes her mother has difficulty leaving the house, which is the reason for the lack of notary stamp.  I advised her that we cannot change protocol -- if she needs something notarized, then she needs that done.  Advised to check with her church to see if there is a notary that can go to her home to get the necessary papers notarized - verbalized agreement.  Dutch Quint RN  February 18, 2011 12:08 PM

## 2011-02-27 NOTE — Progress Notes (Signed)
Summary: CHANGE IN BOWEL COLOR  Phone Note Call from Patient Call back at Home Phone [phone]   Caller: Patient Reason for Call: Talk to Nurse Summary of Call: MARTIN PT. MS Dorris SAYS THAT HER BOWEL MOVEMENT FOR THE LAST COUPLE OF DAYS HAS BEEN A DARK GREEN AND SHE IS CONCERNED ABOUT THAT.  SHE WAS SEEN AT THE URGENT  CARE AND THEY GAVE HER METRONIDAZOLE FOR TRIC.  Initial call taken by: Leodis Rains,  February 11, 2011 2:31 PM  Follow-up for Phone Call        Line busy.  Dutch Quint RN  February 14, 2011 2:54 PM  Left message on answering machine for pt. to return call.  Dutch Quint RN  February 18, 2011 9:55 AM  Pt. states stools have returned to normal color.  No further issues currently.  Advised that medications, change in diet, illness and supplements can often change color/character of stools.  To call back if stools change without notice or change of the above.   Verbalized understanding.  Dutch Quint RN  February 18, 2011 11:37 AM

## 2011-03-04 NOTE — Progress Notes (Signed)
Summary: Klonopin refill  Phone Note Call from Patient   Summary of Call: [place] card expired -- had to complete papers and needed another appt. with eligibility.  Scheduled 03/12/11.  Has appt. 03/05/11 but may have to reschedule due to eligibility renewal.  Wants to know if you will still refill her klonopin?  Uses [facility] [address].   Initial call taken by: Dutch Quint RN,  February 25, 2011 12:05 PM  Follow-up for Phone Call        notify that yes, I will refill it when due on 02/27/2011 (Thursday)  and [facility] to [facility] Follow-up by: Lehman Prom FNP,  February 25, 2011 12:17 PM  Additional Follow-up for Phone Call Additional follow up Details #1::        Pt. notified.  Dutch Quint RN  February 25, 2011 1:39 PM

## 2011-03-05 ENCOUNTER — Encounter: Payer: Self-pay | Admitting: Nurse Practitioner

## 2011-03-05 ENCOUNTER — Encounter (INDEPENDENT_AMBULATORY_CARE_PROVIDER_SITE_OTHER): Payer: Self-pay | Admitting: Nurse Practitioner

## 2011-03-05 LAB — PROTIME-INR
INR: 0.98 (ref 0.00–1.49)
Prothrombin Time: 13.2 seconds (ref 11.6–15.2)

## 2011-03-05 LAB — CONVERTED CEMR LAB
Bilirubin Urine: NEGATIVE
Blood in Urine, dipstick: NEGATIVE
Glucose, Urine, Semiquant: NEGATIVE
Ketones, urine, test strip: NEGATIVE
Nitrite: NEGATIVE
Protein, U semiquant: NEGATIVE
Specific Gravity, Urine: 1.01
Urobilinogen, UA: 0.2
WBC Urine, dipstick: NEGATIVE
pH: 6

## 2011-03-05 LAB — CBC
HCT: 34.3 % — ABNORMAL LOW (ref 36.0–46.0)
Hemoglobin: 11.4 g/dL — ABNORMAL LOW (ref 12.0–15.0)
MCH: 27.4 pg (ref 26.0–34.0)
MCHC: 33.2 g/dL (ref 30.0–36.0)
MCV: 82.5 fL (ref 78.0–100.0)
Platelets: 293 10*3/uL (ref 150–400)
RBC: 4.16 MIL/uL (ref 3.87–5.11)
RDW: 13.7 % (ref 11.5–15.5)
WBC: 4.4 10*3/uL (ref 4.0–10.5)

## 2011-03-05 LAB — DIFFERENTIAL
Basophils Absolute: 0 10*3/uL (ref 0.0–0.1)
Basophils Relative: 1 % (ref 0–1)
Eosinophils Absolute: 0.2 10*3/uL (ref 0.0–0.7)
Eosinophils Relative: 5 % (ref 0–5)
Lymphocytes Relative: 39 % (ref 12–46)
Lymphs Abs: 1.7 10*3/uL (ref 0.7–4.0)
Monocytes Absolute: 0.5 10*3/uL (ref 0.1–1.0)
Monocytes Relative: 10 % (ref 3–12)
Neutro Abs: 2 10*3/uL (ref 1.7–7.7)
Neutrophils Relative %: 45 % (ref 43–77)

## 2011-03-05 LAB — BASIC METABOLIC PANEL
BUN: 5 mg/dL — ABNORMAL LOW (ref 6–23)
CO2: 27 mEq/L (ref 19–32)
Calcium: 9.6 mg/dL (ref 8.4–10.5)
Chloride: 107 mEq/L (ref 96–112)
Creatinine, Ser: 0.96 mg/dL (ref 0.4–1.2)
GFR calc Af Amer: >60 mL/min (ref >60)
GFR calc non Af Amer: >60 mL/min (ref >60)
Glucose, Bld: 125 mg/dL — ABNORMAL HIGH (ref 70–99)
Potassium: 3.2 mEq/L — ABNORMAL LOW (ref 3.5–5.1)
Sodium: 140 mEq/L (ref 135–145)

## 2011-03-06 ENCOUNTER — Encounter (INDEPENDENT_AMBULATORY_CARE_PROVIDER_SITE_OTHER): Payer: Self-pay | Admitting: Nurse Practitioner

## 2011-03-06 LAB — CONVERTED CEMR LAB
ALT: 13 units/L (ref 0–35)
AST: 21 units/L (ref 0–37)
Albumin: 4.3 g/dL (ref 3.5–5.2)
Alkaline Phosphatase: 104 units/L (ref 39–117)
BUN: 11 mg/dL (ref 6–23)
CO2: 26 meq/L (ref 19–32)
Calcium: 9.7 mg/dL (ref 8.4–10.5)
Chloride: 104 meq/L (ref 96–112)
Creatinine, Ser: 0.84 mg/dL (ref 0.40–1.20)
Glucose, Bld: 84 mg/dL (ref 70–99)
Potassium: 4 meq/L (ref 3.5–5.3)
Sodium: 139 meq/L (ref 135–145)
Total Bilirubin: 0.3 mg/dL (ref 0.3–1.2)
Total Protein: 7.9 g/dL (ref 6.0–8.3)

## 2011-03-07 ENCOUNTER — Telehealth (INDEPENDENT_AMBULATORY_CARE_PROVIDER_SITE_OTHER): Payer: Self-pay | Admitting: Nurse Practitioner

## 2011-03-08 LAB — URINALYSIS, ROUTINE W REFLEX MICROSCOPIC
Bilirubin Urine: NEGATIVE
Glucose, UA: NEGATIVE mg/dL
Hgb urine dipstick: NEGATIVE
Ketones, ur: NEGATIVE mg/dL
Nitrite: NEGATIVE
Protein, ur: NEGATIVE mg/dL
Specific Gravity, Urine: 1 — ABNORMAL LOW (ref 1.005–1.030)
Urobilinogen, UA: 0.2 mg/dL (ref 0.0–1.0)
pH: 6.5 (ref 5.0–8.0)

## 2011-03-08 LAB — GC/CHLAMYDIA PROBE AMP, GENITAL
Chlamydia, DNA Probe: POSITIVE — AB
GC Probe Amp, Genital: NEGATIVE

## 2011-03-08 LAB — WET PREP, GENITAL
Trich, Wet Prep: NONE SEEN
Yeast Wet Prep HPF POC: NONE SEEN

## 2011-03-08 LAB — GLUCOSE, CAPILLARY: Glucose-Capillary: 87 mg/dL (ref 70–99)

## 2011-03-08 LAB — URINE MICROSCOPIC-ADD ON

## 2011-03-09 LAB — DIFFERENTIAL
Basophils Absolute: 0 10*3/uL (ref 0.0–0.1)
Basophils Relative: 0 % (ref 0–1)
Eosinophils Absolute: 0 10*3/uL (ref 0.0–0.7)
Eosinophils Relative: 1 % (ref 0–5)
Lymphocytes Relative: 25 % (ref 12–46)
Lymphs Abs: 1.1 10*3/uL (ref 0.7–4.0)
Monocytes Absolute: 0.4 10*3/uL (ref 0.1–1.0)
Monocytes Relative: 10 % (ref 3–12)
Neutro Abs: 2.8 10*3/uL (ref 1.7–7.7)
Neutrophils Relative %: 64 % (ref 43–77)

## 2011-03-09 LAB — BASIC METABOLIC PANEL
BUN: 11 mg/dL (ref 6–23)
BUN: 13 mg/dL (ref 6–23)
CO2: 20 mEq/L (ref 19–32)
CO2: 25 mEq/L (ref 19–32)
Calcium: 9.2 mg/dL (ref 8.4–10.5)
Calcium: 9.8 mg/dL (ref 8.4–10.5)
Chloride: 102 mEq/L (ref 96–112)
Chloride: 105 mEq/L (ref 96–112)
Creatinine, Ser: 1.05 mg/dL (ref 0.4–1.2)
Creatinine, Ser: 1.05 mg/dL (ref 0.4–1.2)
GFR calc Af Amer: >60 mL/min (ref >60)
GFR calc Af Amer: >60 mL/min (ref >60)
GFR calc non Af Amer: 54 mL/min — ABNORMAL LOW (ref >60)
GFR calc non Af Amer: 54 mL/min — ABNORMAL LOW (ref >60)
Glucose, Bld: 119 mg/dL — ABNORMAL HIGH (ref 70–99)
Glucose, Bld: 93 mg/dL (ref 70–99)
Potassium: 2.9 mEq/L — ABNORMAL LOW (ref 3.5–5.1)
Potassium: 3.7 mEq/L (ref 3.5–5.1)
Sodium: 135 mEq/L (ref 135–145)
Sodium: 136 mEq/L (ref 135–145)

## 2011-03-09 LAB — CBC
HCT: 33.9 % — ABNORMAL LOW (ref 36.0–46.0)
HCT: 40.8 % (ref 36.0–46.0)
Hemoglobin: 11.5 g/dL — ABNORMAL LOW (ref 12.0–15.0)
Hemoglobin: 13.8 g/dL (ref 12.0–15.0)
MCH: 28.1 pg (ref 26.0–34.0)
MCHC: 33.6 g/dL (ref 30.0–36.0)
MCHC: 33.9 g/dL (ref 30.0–36.0)
MCV: 82.9 fL (ref 78.0–100.0)
MCV: 83.6 fL (ref 78.0–100.0)
Platelets: 261 10*3/uL (ref 150–400)
Platelets: 304 10*3/uL (ref 150–400)
RBC: 4.05 MIL/uL (ref 3.87–5.11)
RBC: 4.92 MIL/uL (ref 3.87–5.11)
RDW: 13.8 % (ref 11.5–15.5)
RDW: 14.1 % (ref 11.5–15.5)
WBC: 4.4 10*3/uL (ref 4.0–10.5)
WBC: 4.7 10*3/uL (ref 4.0–10.5)

## 2011-03-09 LAB — POCT CARDIAC MARKERS
CKMB, poc: 1.1 ng/mL (ref 1.0–8.0)
Myoglobin, poc: 149 ng/mL (ref 12–200)
Troponin i, poc: <0.05 ng/mL (ref 0.00–0.09)

## 2011-03-09 LAB — URINALYSIS, ROUTINE W REFLEX MICROSCOPIC
Bilirubin Urine: NEGATIVE
Glucose, UA: NEGATIVE mg/dL
Hgb urine dipstick: NEGATIVE
Ketones, ur: NEGATIVE mg/dL
Nitrite: NEGATIVE
Protein, ur: 30 mg/dL — AB
Specific Gravity, Urine: 1.009 (ref 1.005–1.030)
Urobilinogen, UA: 0.2 mg/dL (ref 0.0–1.0)
pH: 7 (ref 5.0–8.0)

## 2011-03-09 LAB — POCT I-STAT, CHEM 8
BUN: 11 mg/dL (ref 6–23)
Calcium, Ion: 1.25 mmol/L (ref 1.12–1.32)
Chloride: 103 mEq/L (ref 96–112)
Creatinine, Ser: 1 mg/dL (ref 0.4–1.2)
Glucose, Bld: 87 mg/dL (ref 70–99)
HCT: 45 % (ref 36.0–46.0)
Hemoglobin: 15.3 g/dL — ABNORMAL HIGH (ref 12.0–15.0)
Potassium: 3.2 mEq/L — ABNORMAL LOW (ref 3.5–5.1)
Sodium: 140 mEq/L (ref 135–145)
TCO2: 28 mmol/L (ref 0–100)

## 2011-03-09 LAB — CARDIAC PANEL(CRET KIN+CKTOT+MB+TROPI)
CK, MB: 0.8 ng/mL (ref 0.3–4.0)
CK, MB: 0.9 ng/mL (ref 0.3–4.0)
Relative Index: INVALID (ref 0.0–2.5)
Relative Index: INVALID (ref 0.0–2.5)
Total CK: 50 U/L (ref 7–177)
Total CK: 57 U/L (ref 7–177)
Troponin I: 0.02 ng/mL (ref 0.00–0.06)
Troponin I: 0.03 ng/mL (ref 0.00–0.06)

## 2011-03-09 LAB — RAPID URINE DRUG SCREEN, HOSP PERFORMED
Amphetamines: NOT DETECTED
Barbiturates: NOT DETECTED
Benzodiazepines: NOT DETECTED
Cocaine: NOT DETECTED
Opiates: NOT DETECTED
Tetrahydrocannabinol: NOT DETECTED

## 2011-03-09 LAB — PROTIME-INR
INR: 1.1 (ref 0.00–1.49)
Prothrombin Time: 14.1 seconds (ref 11.6–15.2)

## 2011-03-09 LAB — URINE MICROSCOPIC-ADD ON

## 2011-03-09 LAB — CK TOTAL AND CKMB (NOT AT ARMC)
CK, MB: 1 ng/mL (ref 0.3–4.0)
Relative Index: INVALID (ref 0.0–2.5)
Total CK: 62 U/L (ref 7–177)

## 2011-03-09 LAB — TROPONIN I: Troponin I: 0.03 ng/mL (ref 0.00–0.06)

## 2011-03-09 LAB — MAGNESIUM: Magnesium: 2 mg/dL (ref 1.5–2.5)

## 2011-03-11 NOTE — Assessment & Plan Note (Signed)
Summary: HTN/Anxiety   Vital Signs:  Patient profile:   55 year old female Menstrual status:  postmenopausal Weight:      195.19 pounds Temp:     98.1 degrees F oral Pulse rate:   76 / minute Pulse rhythm:   regular Resp:     18 per minute BP sitting:   135 / 80  (left arm) Cuff size:   regular  Vitals Entered By: Hale Drone CMA (March 05, 2011 11:41 AM) CC: f/u on BP, Hypertension Management, Depression, Lipid Management Is Patient Diabetic? No Pain Assessment Patient in pain? no       Does patient need assistance? Functional Status Self care Ambulation Normal   Primary Care Provider:  Tereso Newcomer, PA-C  CC:  f/u on BP, Hypertension Management, Depression, and Lipid Management.  History of Present Illness:  Pt into the office for f/u on htn and medications Pt's eligibilty had expired and she recently renewed Pt presents today with only 3 medications and states that finances prevented her from getting the others - carvedilol and pravachol.  Depression History:      The patient is having a depressed mood most of the day and has a diminished interest in her usual daily activities.  Positive alarm features for depression include insomnia and fatigue (loss of energy).  However, she denies recurrent thoughts of death or suicide.        Psychosocial stress factors include major life changes.  The patient denies that she feels like life is not worth living, denies that she wishes that she were dead, and denies that she has thought about ending her life.         Depression Treatment History:  Prior Medication Used:   Start Date: Assessment of Effect:   Comments:  Effexor (venlafaxine)     10/30/2010   some improvement     started  Hypertension History:      She denies headache, chest pain, and palpitations.  She notes no problems with any antihypertensive medication side effects.        Positive major cardiovascular risk factors include female age 68 years old or older,  hyperlipidemia, and hypertension.  Negative major cardiovascular risk factors include no history of diabetes and non-tobacco-user status.        Positive history for target organ damage include ASHD (either angina/prior MI/prior CABG).  Further assessment for target organ damage reveals no history of stroke/TIA, peripheral vascular disease, renal insufficiency, or hypertensive retinopathy.    Lipid Management History:      Positive NCEP/ATP III risk factors include female age 44 years old or older, hypertension, and ASHD (either angina/prior MI/prior CABG).  Negative NCEP/ATP III risk factors include no history of early menopause without estrogen hormone replacement, non-diabetic, non-tobacco-user status, no prior stroke/TIA, no peripheral vascular disease, and no history of aortic aneurysm.        The patient states that she does not know about the "Therapeutic Lifestyle Change" diet.  The patient does not know about adjunctive measures for cholesterol lowering.  She expresses no side effects from her lipid-lowering medication.  Comments include: No but pt is not able to afford the pravachol.  The patient denies any symptoms to suggest myopathy or liver disease.       Habits & Providers  Alcohol-Tobacco-Diet     Alcohol drinks/day: 0     Tobacco Status: never  Exercise-Depression-Behavior     Does Patient Exercise: no     Have you felt down  or hopeless? yes     Have you felt little pleasure in things? yes     Depression Counseling: further diagnostic testing and/or other treatment is indicated     Seat Belt Use: 100  Current Medications (verified): 1)  Pravachol 10 Mg  Tabs (Pravastatin Sodium) .... Take 1 Tablet By Mouth Once A Day Before Bed. 2)  Norvasc 10 Mg Tabs (Amlodipine Besylate) .... One Tablet By Mouth Daily For Blood Pressure **note Change in Dose** 3)  Protonix 40 Mg Tbec (Pantoprazole Sodium) .... Take 1 Tablet By Mouth Once A Day For Stomach 4)  Carvedilol 6.25 Mg Tabs  (Carvedilol) .... One Tablet By Mouth Two Times A Day 5)  Butalbital-Aspirin-Caffeine 50-325-40 Mg Tabs (Butalbital-Aspirin-Caffeine) .... One Tablet By Mouth Daily As Needed For Headache 6)  Effexor Xr 37.5 Mg Xr24h-Cap (Venlafaxine Hcl) .... One Capsule By Mouth Daily X 1 Week Then Increase To 2 Capsules By Mouth Daily For Mood 7)  Clonazepam 1 Mg Tabs (Clonazepam) .... One Tablet By Mouth Daily As Needed For Nerves 8)  Meclizine Hcl 25 Mg Tabs (Meclizine Hcl) .... One Tablet By Mouth Daily As Needed For Dizziness  Allergies (verified): 1)  ! Cipro  Review of Systems General:  Denies fever. CV:  Denies chest pain or discomfort. Resp:  Denies cough. GI:  Denies abdominal pain, nausea, and vomiting. Psych:  Complains of anxiety and depression.  Physical Exam  General:  alert.   Head:  normocephalic.   Ears:  ear piercing(s) noted.   Lungs:  normal breath sounds.   Heart:  normal rate and regular rhythm.   Neurologic:  alert & oriented X3.   Skin:  color normal.   Psych:  Oriented X3.     Impression & Recommendations:  Problem # 1:  ANXIETY DISORDER, GENERALIZED (ICD-300.02) Integrated healthcare Mental health in to see the pt today - advised pt that she needs to learn some coping mechanisms She asked repeatedly for clonzepam to be increased - provider firm that she neds to stay on current dose and in the future would need to decrease Her updated medication list for this problem includes:    Effexor Xr 37.5 Mg Xr24h-cap (Venlafaxine hcl) ..... One capsule by mouth daily x 1 week then increase to 2 capsules by mouth daily for mood    Clonazepam 1 Mg Tabs (Clonazepam) ..... One tablet by mouth daily as needed for nerves  Problem # 2:  HYPERTENSION (ICD-401.9)  Her updated medication list for this problem includes:    Norvasc 10 Mg Tabs (Amlodipine besylate) ..... One tablet by mouth daily for blood pressure **note change in dose**    Carvedilol 6.25 Mg Tabs (Carvedilol) .....  One tablet by mouth two times a day  Orders: T-Comprehensive Metabolic Panel ([id number])  Problem # 3:  CORONARY ARTERY DISEASE (ICD-414.00)  Her updated medication list for this problem includes:    Norvasc 10 Mg Tabs (Amlodipine besylate) ..... One tablet by mouth daily for blood pressure **note change in dose**    Carvedilol 6.25 Mg Tabs (Carvedilol) ..... One tablet by mouth two times a day  Problem # 4:  HYPERLIPIDEMIA (ICD-272.4)  Her updated medication list for this problem includes:    Crestor 10 Mg Tabs (Rosuvastatin calcium) ..... One tablet by mouth nightly for cholesterol **d/c pravachol**  Complete Medication List: 1)  Crestor 10 Mg Tabs (Rosuvastatin calcium) .... One tablet by mouth nightly for cholesterol **d/c pravachol** 2)  Norvasc 10 Mg Tabs (Amlodipine besylate) .... One  tablet by mouth daily for blood pressure **note change in dose** 3)  Protonix 40 Mg Tbec (Pantoprazole sodium) .... Take 1 tablet by mouth once a day for stomach 4)  Carvedilol 6.25 Mg Tabs (Carvedilol) .... One tablet by mouth two times a day 5)  Butalbital-aspirin-caffeine 50-325-40 Mg Tabs (Butalbital-aspirin-caffeine) .... One tablet by mouth daily as needed for headache 6)  Effexor Xr 37.5 Mg Xr24h-cap (Venlafaxine hcl) .... One capsule by mouth daily x 1 week then increase to 2 capsules by mouth daily for mood 7)  Clonazepam 1 Mg Tabs (Clonazepam) .... One tablet by mouth daily as needed for nerves  Other Orders: UA Dipstick w/o Micro (automated)  (81003)  Hypertension Assessment/Plan:      The patient's hypertensive risk group is category C: Target organ damage and/or diabetes.  Today's blood pressure is 135/80.  Her blood pressure goal is < 140/90.  Lipid Assessment/Plan:      Based on NCEP/ATP III, the patient's risk factor category is "history of coronary disease, peripheral vascular disease, cerebrovascular disease, or aortic aneurysm".  The patient's lipid goals are as follows: Total  cholesterol goal is 200; LDL cholesterol goal is 100; HDL cholesterol goal is 40; Triglyceride goal is 150.  Her LDL cholesterol goal has not been met.    Patient Instructions: 1)  Your labs will be checked today and you will be notified of the results. 2)  Your Clonazepam is due on April 8th. 3)  I'm glad you have an opportunity it meet with Clydie Braun today.  Lifestyle changes in addition to medications work well together. 4)  Follow up for addition sessions as schedued. 5)  Follow up in 6-8 weeks with n.martin,fnp for blood pressure and mood. Prescriptions: CRESTOR 10 MG TABS (ROSUVASTATIN CALCIUM) One tablet by mouth nightly for cholesterol **D/C pravachol**  #30 x 11   Entered and Authorized by:   Lehman Prom FNP   Signed by:   Lehman Prom FNP on 03/05/2011   Method used:   Faxed to ...       [facility] (retail)       [address] [place], [place]  [address]       Ph: [phone]       Fax: [phone]   RxID:   [id number]    Orders Added: 1)  UA Dipstick w/o Micro (automated)  [81003] 2)  Est. Patient Level IV [34742] 3)  T-Comprehensive Metabolic Panel [[id number]]    Laboratory Results   Urine Tests  Date/Time Received: March 05, 2011 11:42 AM   Routine Urinalysis   Color: lt. yellow Glucose: negative   (Normal Range: Negative) Bilirubin: negative   (Normal Range: Negative) Ketone: negative   (Normal Range: Negative) Spec. Gravity: 1.010   (Normal Range: 1.003-1.035) Blood: negative   (Normal Range: Negative) pH: 6.0   (Normal Range: 5.0-8.0) Protein: negative   (Normal Range: Negative) Urobilinogen: 0.2   (Normal Range: 0-1) Nitrite: negative   (Normal Range: Negative) Leukocyte Esterace: negative   (Normal Range: Negative)

## 2011-03-11 NOTE — Progress Notes (Signed)
Summary: call in headache medicine  Phone Note Call from Patient   Reason for Call: Talk to Nurse Summary of Call: Wants you to call in her headache medicine..can't pronounce the name Initial call taken by: Nicholaus Bloom,  February 25, 2011 4:26 PM  Follow-up for Phone Call        Rx printed - will [facility] to [facility]; advise pt to check there Follow-up by: Lehman Prom FNP,  February 26, 2011 11:11 AM  Additional Follow-up for Phone Call Additional follow up Details #1::        Pt. picked up Rx today.  Has f/u appt. with provider 03/05/11 - appt. confirmed.  Dutch Quint RN  March 03, 2011 6:19 PM     New/Updated Medications: BUTALBITAL-ASPIRIN-CAFFEINE 50-325-40 MG TABS (BUTALBITAL-ASPIRIN-CAFFEINE) One tablet by mouth daily as needed for headache Prescriptions: BUTALBITAL-ASPIRIN-CAFFEINE 50-325-40 MG TABS (BUTALBITAL-ASPIRIN-CAFFEINE) One tablet by mouth daily as needed for headache  #30 x 0   Entered and Authorized by:   Lehman Prom FNP   Signed by:   Lehman Prom FNP on 02/26/2011   Method used:   Printed then faxed to ...       [facility] [address] [phone]* (retail)       [address]       [place], [place]  [address]       Ph: [phone]       Fax: [phone]   RxID:   [id number]

## 2011-03-18 ENCOUNTER — Telehealth (INDEPENDENT_AMBULATORY_CARE_PROVIDER_SITE_OTHER): Payer: Self-pay | Admitting: Nurse Practitioner

## 2011-03-19 ENCOUNTER — Telehealth (INDEPENDENT_AMBULATORY_CARE_PROVIDER_SITE_OTHER): Payer: Self-pay | Admitting: Nurse Practitioner

## 2011-03-20 NOTE — Progress Notes (Signed)
Summary: Anxious to know lab results  Phone Note Call from Patient   Reason for Call: Lab or Test Results Summary of Call: Anxious to know results Initial call taken by: Nicholaus Bloom,  March 07, 2011 10:26 AM  Follow-up for Phone Call        DLabs from yesterday look normal, is that right?  OK to share with Catherine [name]?  She's called twice.  Catherine [name]. called again wanting to know her results.... She seemed very anxious and was disturbed that she had to wait.... She was also concnerned that Sharrie Rothman had not called her.... Spoke w/Karen and she is working on her chart and will be calling Catherine [name]. before days end.... Hale Drone CMA  March 07, 2011 12:30 PM  Follow-up by: Dutch Quint RN,  March 07, 2011 12:26 PM  Additional Follow-up for Phone Call Additional follow up Details #1::        spoke with Clydie Braun about changes in meds related to anxiety  You can let the Catherine [name] know that her labs are ok- kidneys and liver looks well. Additional Follow-up by: Lehman Prom FNP,  March 07, 2011 12:41 PM    Additional Follow-up for Phone Call Additional follow up Details #2::    ANGIE WAS GIVEN ME THE PAPER LAB RESULTS AND SENT  ALL TO YOU IN AN OUTGUIDE.AFTER YOU SIGNED THEM I SCANNED  SCANNED IN EMR//MELINDA SAID THAT SHE HAD FIX IT SO IT WOULD GO DIRECTLY INTO EMR LIKE IT DID @ [facility],SO I HAVEN'T RECEIVED ANYMORE FROM ANGIE IN A WHILE  Advised of lab results and refills.  Verbalized understanding, states that she's not taking the Buspar anymore, it makes her sick.  Dutch Quint RN  March 10, 2011 12:22 PM  Please advise karen with mental health about Catherine [name]'s decision to stop medications.  her team will then f/u with Catherine [name] n.martin,fnp March 10, 2011  1:03 PM  MRS Graveline  CALLED SHE CALL [facility]  [address] AND THEY DON'T HAVE  HER CLONAZEPAM REFILL. Fuller Song HER @ [phone] Jane Canary,  March 10, 2011 2:26 PM  Advised Catherine [name]. that it was my error -- that it is a dosage change note for her next refill.  Catherine [name].  verbalized understanding.  Reiterated that she's not going to take the Buspar. Notes printed and given to Clydie Braun in [facility].   Dutch Quint RN  March 10, 2011 2:57 PM   New/Updated Medications: EFFEXOR XR 75 MG XR24H-CAP (VENLAFAXINE HCL) One tablet by mouth daily CLONAZEPAM 0.5 MG TABS (CLONAZEPAM) One tablet by mouth two times a day as needed for anxiety BUSPIRONE HCL 15 MG TABS (BUSPIRONE HCL) 1/2 tablet by mouth two times a day for anxiety Prescriptions: BUSPIRONE HCL 15 MG TABS (BUSPIRONE HCL) 1/2 tablet by mouth two times a day for anxiety  #30 x 5   Entered and Authorized by:   Lehman Prom FNP   Signed by:   Lehman Prom FNP on 03/07/2011   Method used:   Faxed to ...       [facility] (retail)       [address] [place], [place]  [address]       Ph: [phone]       Fax: [phone]   RxID:   [id number] CLONAZEPAM 0.5 MG TABS (CLONAZEPAM) One tablet by mouth two times a day as needed for anxiety  #60 x 0   Entered and Authorized  by:   Lehman Prom FNP   Signed by:   Lehman Prom FNP on 03/07/2011   Method used:   Historical   RxID:   [id number]

## 2011-03-25 NOTE — Progress Notes (Signed)
Summary: Is she to increase Klonopin  Phone Note Call from Patient Call back at [phone]   Reason for Call: Talk to Nurse Summary of Call: Is she to increase Klonopin  Follow-up for Phone Call        Advised that clonazepam decreased to 05. mg. from 1 mg., but increased to twice a day, 60 tablets.  She can split her current 1 mg. in half to cover the new orders until she picks up new Rx.  Also getting meds sent to [facility] pharmacy again, since her orange card is renewed.  Carvedilol refill sent to [facility] pharmacy per pt. request.  Verbalized understanding and agreement.  Dutch Quint RN  March 18, 2011 1:02 PM      Prescriptions: CARVEDILOL 6.25 MG TABS (CARVEDILOL) One tablet by mouth two times a day  #60 x 3   Entered by:   Dutch Quint RN   Authorized by:   Lehman Prom FNP   Signed by:   Dutch Quint RN on 03/18/2011   Method used:   Faxed to ...       [facility] (retail)       [address] [place], [place]  [address]       Ph: [phone]       Fax: [phone]   RxID:   [id number]   Appended Document: Is she to increase Klonopin Clonazepam Rx called into [facility] on [address] per pt. request.  Dutch Quint RN  March 18, 2011 5:06 PM

## 2011-03-25 NOTE — Progress Notes (Signed)
Summary: Concerned Klonopin is different?  Phone Note Call from Patient Call back at [phone]   Summary of Call: the Klonopin picked up is different shape/color, mg is different, call pat for explanation Initial call taken by: Ernestine Mcmurray,  March 19, 2011 2:29 PM  Follow-up for Phone Call        Explained to pt. yesterday that clonazepam dosage had been changed -- from 1.0 mg. daily to 0.5 mg twice daily to give her better coverage.  Reiterated that information again to her today, explained change in color/shape of pill was due to change in pill dosage.  Verbalized understanding and agreement.  Follow-up by: Dutch Quint RN,  March 19, 2011 3:15 PM

## 2011-05-06 NOTE — Discharge Summary (Signed)
Catherine [name], Catherine [name] NO.:  [id number]   MEDICAL RECORD NO.:  [id number]          PATIENT TYPE:  INP   LOCATION:  3707                         FACILITY:  [facility]   PHYSICIAN:  Nanetta Batty, M.D.   DATE OF BIRTH:  [birth year]   DATE OF ADMISSION:  04/27/2008  DATE OF DISCHARGE:  04/28/2008                               DISCHARGE SUMMARY   DISCHARGE DIAGNOSES:  1. Chest pain, noncardiac.      a.     Six ER visits for chest pain and negative cardiac markers       since January 2009.  2. History of coronary artery disease with 60% LAD, negative Myoview      study in December 2008.  3. Elevated D-dimer with history of D-dimer elevation on ER visit for      chest pain, March 2009.  CT angio at that time was negative for      pulmonary embolism.      a.     Negative venous Dopplers on this admission.  4. Hypertension.  5. Dyslipidemia.  6. Anemia, rule out cause.      a.     Negative hemoccult.  7. Renal insufficiency, questionable combination of ACE inhibitor and      nonsteroidal.   DISCHARGE CONDITION:  Improved.   PROCEDURES:  None.   DISCHARGE MEDICATIONS:  1. Carvedilol 6.25 mg 1 twice a day in the morning and in the evening.      Please note, the patient had been taking both tablets in the      morning.  2. No benazepril on Apr 28, 2008, and begin 20 mg or half a tablet      daily on Apr 29, 2008.  3. Pravastatin 20 mg daily at night.  4. Maxzide 37.5/25 one daily.  5. Prilosec 20 mg daily, new.  6. Aspirin 81 mg daily.  7. Diazepam 2 mg 1 every 12 hours as needed as before.  8. Nitroglycerin sublingual, take 1 under the tongue if needed for      severe chest pain while sitting down.  9. Nu-Iron 150 mg daily.   Have lab work done, May 01, 2008.   DISCHARGE INSTRUCTIONS.:  1. May return to work on Apr 29, 2008.  She actually will be out for      summer break with her last day of work would have been Apr 28, 2008.  2. Low-sodium, heart-healthy diet.  3. No activity restrictions.  4. Follow up with Dr. Jacinto Halim, May 09, 2008, at 9 a.m.  5. Follow up with [facility].  Our office will call for an appointment      date.   HISTORY OF PRESENT ILLNESS:  A 55 year old African-American female with  history of cardiac cath on March 01, 2007, with EF of 50%-55% with mild  global hypokinesis, 60% mid LAD, and distal LAD is very small.  She had  a stress Myoview in December 2008, which was negative for ischemia.  EF  was 71%.  Since that time, the patient had frequent ER visits for chest  pain,  6 visits since the end of January.  On Apr 27, 2008, she was at  work in [employer] at [employer], where she works and developed hard  midsternal cramp, and then it lasted a couple of minutes, and there was  only ache or soreness in her chest, associated with shortness of breath,  some nausea, and some diaphoresis, but it is hard to decide because she  also had hot flashes and no dizziness.  In the ER, she got Zofran for  nausea and aspirin, and she had mild discomfort when she was seen.  She  told me her story, she was having 1 episode may be every 1-2 months, but  then she told Dr. Allyson Sabal, she had several episodes today, but not usually  as bad as the episode was on admission.  Minimal tenderness to palpation  of the upper chest wall.   PAST MEDICAL HISTORY:  1. Hypertension.  2. Coronary artery disease as described.  3. Dyslipidemia.  4. She had been seen in the ER for possible rectal tear with rectal      bleeding, which has resolved.  5. History of metabolic syndrome.   ALLERGIES:  CIPRO, which was new.   OUTPATIENT MEDICATIONS:  1. Maxzide.  2. Pravastatin.  3. Benazepril 40.  4. Coreg 6.25, she will just take in the morning.  5. Meclizine p.r.n.  6. Diazepam, originally it was thought 10, but it is really 2 mg twice      a day.  7. Aspirin 81 daily.   FAMILY HISTORY:  See H&P.   SOCIAL HISTORY:  See H&P.   REVIEW OF SYSTEMS:  See H&P.    PHYSICAL EXAMINATION ON DISCHARGE:  VITAL SIGNS:  Blood pressure 102/67,  pulse 60, respiratory rate stable, and temperature 97.9.  CHEST:  Clear.  HEART:  Regular rate and rhythm.  Telemetry, sinus rhythm.  ABDOMEN:  Soft and nontender.   LABORATORY DATA:  Hemoglobin on admission was 10.4, hematocrit 29.8, WBC  3.6, platelets 237, MCV 81.6, and neutrophils 59.  On recheck on  heparin, hemoglobin 9.6, hematocrit 28.9, WBC 4.6, platelets 216, and  MCV 82.7.  I did a rectal and checked her stool, which was heme-  negative.   Chemistry:  Sodium 139; potassium 3.5 and it dropped to 3.4, she was  given potassium supplement; chloride 106; CO2 24; BUN 11, creatinine  0.13, the next morning 1.35 and that was after her benazepril as well as  Toradol, so benazepril was held the day of discharge; and glucose 80.  Coag:  Pro time 13.5, INR of 1, and PTT 24.  On heparin, she was  therapeutic.  LFTs:  AST 21, ALT 13, alkaline phos 52, total bili 0.6,  and albumin 3.9.  Cardiac enzymes:  CK is 61-47, MB is 1.4-1.0, troponin  I 0.03.  Total cholesterol 129, LDL 80, HDL 36, triglycerides 67,  magnesium 2.1, and calcium 9.4.   HOSPITAL COURSE:  The patient was admitted, placed on heparin and nitro,  and kept overnight.  Venous Dopplers were negative for clot.  By the  next morning, she had no further chest pain, vitals were stable, her  creatinine had bumped slightly, we will hold the benazepril for today  and resume tomorrow.  We did a stool for heme, and it was negative.  Iron is pending.   Heparin and nitro were discontinued.  The patient was ambulated without  problems and will be discharged home.  We will have her see  gastroenterologist as an outpatient.  She has never had a colonoscopy at  10, and it could be she has H. pylori and multiple GI reasons for having  pain.  That will be arranged as an outpatient, as she is quite anxious  to be discharged and is currently stable.  Cardiac enzymes  were all  negative with CK 47-61, MB 1.0-1.4, troponin I 0.03, LDL was 80.  I  increased her Pravachol from 10 to 20; though she may have been on 20,  it was difficult though she cannot remember.  BNP was less than 30.  Iron was 65.  TSH 1.695.  Vitamin B12 636, ferritin 169, and folate  18.9.  D-dimer had been elevated as stated at 3.54.   On chest x-ray, no acute or active process was seen.   EKGs:  She was sinus rhythm, nonspecific T-wave abnormality.  She will  be followed as an outpatient by Dr. Jacinto Halim.      Darcella Gasman. Annie Paras, N.P.      Nanetta Batty, M.D.  Electronically Signed    LRI/MEDQ  D:  04/28/2008  T:  04/29/2008  Job:  [id number]   cc:   Nanetta Batty, M.D.  [facility]  Joaquin Courts, MD

## 2011-05-06 NOTE — Discharge Summary (Signed)
Catherine [name], Catherine [name]               ACCOUNT NO.:  [id number]   MEDICAL RECORD NO.:  [id number]          PATIENT TYPE:  INP   LOCATION:  6533                         FACILITY:  [facility]   PHYSICIAN:  Ileana Roup, M.D.  DATE OF BIRTH:  Jul 30, 1956   DATE OF ADMISSION:  11/15/2007  DATE OF DISCHARGE:  11/18/2007                               DISCHARGE SUMMARY   DISCHARGE DIAGNOSES:  1. Chest pain.  2. Mild coronary artery disease.  3. Hypokalemia.  4. Cystic liver disease.  5. Hypertension.  6. Hyperlipidemia.  7. Generalized anxiety.  8. Depression.   MEDICATIONS ON DISCHARGE:  1. Aspirin 81 mg daily.  2. HCTZ 25 mg daily.  3. Coreg 6.25 mg twice per day.  4. Benazepril 40 mg daily.  5. Valium 2 mg twice per day as needed.  6. Effexor 150 mg daily.  7. Pravachol 10 mg at bedtime.  8. Imdur 30 mg daily.  9. Meclizine 12.5 mg 1-2 tablets every 6 hours as needed.  10.HCTZ 25 mg daily.   DISPOSITION AND FOLLOWUP:  The patient is to follow up as an outpatient  for myocardial perfusion stress testing here at [facility].  After that  test is done, she is to schedule an appointment with Dr. Jacinto Halim in  cardiology.  The patient is also to follow up in the internal medicine  outpatient clinic here at [facility] in one month after discharge.  At that  time a BMET needs to be checked to evaluate the patient for hypokalemia.  If she is hypokalemic, a potassium supplementation may need to be added,  or she may need to have a potassium-sparing diuretic added to her  antihypertensive regimen.   Her CT angiogram was reviewed with the radiologist who noted hepatic  cystic disease and recommended a followup upper quadrant ultrasound in  three months after discharge.   PROCEDURES PERFORMED:  A cardiac catheterization was performed during  this hospitalization and this showed essentially normal coronary  arteries with a possible 20% mid left anterior descending lesion versus  a myocardial  bridge.   CONSULTATIONS:  Cardiology was consulted during this hospitalization  secondary to chest pain with a history of a 60% lesion on the LAD.  Cardiac catheterization was performed and showed essentially normal  coronary arteries with a possible 20% mid LAD lesion versus myocardial  bridge.  The patient was started on Imdur 30 mg daily.  It was also  requested that she undergo a myocardial perfusion stress test here at  [facility] as an outpatient.   ADMITTING HISTORY AND PHYSICAL:  The patient is a 55 year old female  with a past medical history of coronary artery disease, presenting with  a 2- to 3-day history of left-sided chest pain described as crampy that  was mild, associated with mild intermittent dyspnea.  She states it is  associated with diaphoresis but no radiation to the neck, jaw or  shoulder.  She denies any cough, sputum production, fever, hemoptysis or  abdominal pain.   PHYSICAL EXAMINATION:  VITAL SIGNS:  At admission, temperature 97.0,  blood pressure 122/85, pulse  67, respiratory rate 20, O2 saturation 98%  on room air.  RESPIRATORY:  On admission, clear to auscultation bilaterally.  CARDIOVASCULAR:  Regular rate and rhythm, with no murmurs, rubs or  gallops, with a normal S1 and S2.  ABDOMEN:  Obese abdomen.  Soft, nontender, nondistended.  EXTREMITIES:  Without any edema.   LABORATORY INVESTIGATIONS ON ADMISSION:  Sodium 139, potassium 3.8,  chloride 105, bicarbonate 29, BUN 11, creatinine 1.2, glucose 104.  Cardiac enzymes within normal limits.  CBC:  White blood cell count 4.7,  hemoglobin 12.0, MCV 81.3, platelets 367,000.  Urinalysis showed trace  leukocytes, and then microscopic exam shows 0-2 WBCs.  PT, INR and PTT  were within normal limits.  Potassium was rechecked at 2.9.  Liver  enzymes showed bilirubin 0.6, alkaline phosphatase 61, AST 22, ALT 14,  total protein 8.6, albumin 4.0, calcium 9.6, BNP less than 30, D-dimer  3.04, lipase 42.  Urine  drug screen positive for benzodiazepines.   HOSPITAL COURSE:  1. Chest pain:  The patient was admitted with left-sided chest pain      associated with shortness of breath.  Repeat cardiac enzymes were      done along with serial EKGs and there was no evidence of ischemia.      Cardiology was consulted and a repeat cardiac catheterization was      performed that showed essentially normal coronary arteries with a      possible 20% mid left anterior descending lesion versus myocardial      bridging.  No intervention was necessary.  It also showed a normal      ejection fraction at 60%.  Imdur was added to the patient's home      medication regimen.  Since her D-dimer was elevated at 3, a CT      angiogram of the chest was performed.  There was no evidence of      pulmonary embolism.  Hepatic cystic disease was noted.  2. Coronary artery disease:  The patient had cardiac catheterization      in March of this year that showed a 60% lesion to the LAD.  Since      she had chest pain on admission, a repeat catheterization was done      that showed essentially normal coronary artery disease and Imdur      was added to her beta blocker, ACE inhibitor, aspirin and statin      therapy.  3. Hypokalemia:  The patient had a potassium of 2.9 and this was      replaced orally.  It was felt that this might be secondary to her      diuretic use.  The patient will need this followed up as an      outpatient and possibly be switched to a potassium-sparing diuretic      or have potassium added to her outpatient medications.  4. Liver cystic disease:  A CT angiogram of the chest was done      secondary to an elevated D-dimer and on that CT angiogram of the      chest they noted hepatic cystic disease that was essentially      unchanged from the CT angiogram of the chest that was done in June      of this year.  I spoke with Dr. Ruffin Frederick in radiology and he recommended      a followup abdominal ultrasound in three  months to evaluate these  areas better, but felt that these were most likely benign cysts.  5. Hypertension:  The patient was continued on her beta blocker and      ACE inhibitor during this hospitalization and remained      normotensive.  6. Hyperlipidemia:  The patient was recently started on pravastatin as      an outpatient so no liver panel was checked during this      hospitalization as one had been checked within the last month.  The      patient was continued on statin therapy during this      hospitalization.  7. Generalized anxiety disorder:  The patient's home dose of Valium      was continued during this hospitalization without any problems.  8. Depression:  The patient was continued on her Effexor without any      problems.   LABORATORY INVESTIGATIONS ON DISCHARGE:  BMET:  Sodium 136, potassium  3.5, chloride 103, bicarbonate 25, BUN 9, creatinine 0.87, glucose 91,  calcium 8.7.  CBC:  White blood cell count 3.9, hemoglobin 10.4, MCV 82,  platelet count 262,000, TSH 5.280.   DISCHARGE VITAL SIGNS:  Temperature 98.1, blood pressure 101/51, pulse  65, respiratory rate 17, O2 saturation 98% on room air.      Joaquin Courts, MD  Electronically Signed      Ileana Roup, M.D.  Electronically Signed    VW/MEDQ  D:  11/24/2007  T:  11/24/2007  Job:  [id number]

## 2011-05-06 NOTE — H&P (Signed)
[name], [name]               ACCOUNT NO.:  [id number]   MEDICAL RECORD NO.:  [id number]          PATIENT TYPE:  INP   LOCATION:  6533                         FACILITY:  [facility]   PHYSICIAN:  Ritta Slot, MD     DATE OF BIRTH:  10-29-1956   DATE OF ADMISSION:  11/15/2007  DATE OF DISCHARGE:                              HISTORY & PHYSICAL   PROCEDURE PERFORMED:  Cardiac catheterization.  1. Left heart cardiac catheterization.  2. Selective coronary artery visualization.  3. Left ventriculography.   INDICATIONS:  This is a 55 year old African American female with  hypertension, dyslipidemia and coronary artery disease with a 60% mid-  LAD stenosis in March of 2008, now admitted with chest pain and negative  enzymes and no ECG changes.  She has to have a repeat cardiac  catheterization to reevaluate her LAD stenosis that was detected in  March of 2008.   DESCRIPTION OF PROCEDURE:  Patient was brought to the second floor of  the [facility] to the cardiac catheterization laboratory.  The  right groin was prepped and draped in the usual sterile fashion.  Approach was right femoral artery.  Local anesthetic used was 10 mL of  1% lidocaine infiltrated into the right groin area for local anesthesia.  Sedation given was 2 mg of Versed and 25 mcg of fentanyl intravenously.  The right groin was accessed using a Cook needle and a J wire was  inserted into the right femoral artery.  The Cook needle was then  removed and over the J wire was passed a 6-French sheath that was pacing  through RFA.  Through the sheath was passed a JR-4 catheter that was  engaged throughout coronary artery.  Selective visualization of the  right coronary artery was taken in the LAO cranial and RAO cranial  views.  The JR-4 catheter was then exchanged out over the wire with a JL-  4 catheter.  Esophageal catheter was inserted into the left main  coronary artery.  The left coronary artery system was then  viewed in the  LAO cranial, RAO cranial, RAO caudal, LAO caudal and LAO lateral 90  degrees.  The JL-4 catheter was then exchanged out over the J wire for a  bent pigtail.  It was placed into the left ventricle without difficulty.  Contrast, 36 mL, were injected at 12 mL/sec.  The left ventriculography  was performed in the RAO oblique 45-degree angle.  The pigtail catheter  was then removed over the J wire.  Selective visualization of the RFA  stick site was taken using 5 mL of contrast mixed with 5 mL of saline in  the RAO 45 oblique view of the right femoral artery.  Selective  visualization of the right femoral artery deemed it appropriate.  Closed  the groin with an AngioSeal device which was performed without  complication.   COMPLICATIONS:  For the whole procedure were none.   FINDINGS:  1. Aortic pressure 123/73/96.  LV pressure 126/80/13.  Pullback aortic      pressure was 128/73/96.  No aortic valve gradient was found  using      the pullback technique.  2. Right coronary artery was dominant supplying the PDA and there was      no evidence of any coronary artery disease.  Left main coronary      artery was free of any disease.  Left circumflex system was free of      any disease.  The left anterior descending artery was a long vessel      that supplied septal perforators and diagonals.  There was no      significant evidence of any significant lesions.  There was a      possible 20% lesion in the LAD that also could have represented      myocardial bridging.  It was felt that this was not significant and      there was no indication for pursuing percutaneous coronary      intervention.  Left ventriculography demonstrated normal ejection      fraction of 55-60%.   IMPRESSION:  1. Essentially normal coronary arteries with possible 20% mid-left      anterior descending lesion versus myocardial bridge.  No      intervention necessary.  2. Normal ejection fraction at 60% on  left ventriculography.   PLAN:  The patient needs to be managed medically with the addition of  Imdur 30 mg added to her medical therapy.  She will need to undergo  myocardial perfusion stress testing here at [facility] as an outpatient.  She will be followed up with Dr. Yates Decamp.      Ritta Slot, MD  Electronically Signed     HS/MEDQ  D:  11/17/2007  T:  11/18/2007  Job:  [id number]

## 2011-05-09 NOTE — Discharge Summary (Signed)
NAMEMADAY, GUARINO               ACCOUNT NO.:  [id number]   MEDICAL RECORD NO.:  [id number]          PATIENT TYPE:  INP   LOCATION:  3738                         FACILITY:  [facility]   PHYSICIAN:  Edsel Petrin, D.O.DATE OF BIRTH:  1956-09-22   DATE OF ADMISSION:  02/26/2007  DATE OF DISCHARGE:  03/02/2007                               DISCHARGE SUMMARY   DISCHARGE DIAGNOSES:  1. Coronary artery disease status post cardiac catheterization with a      60% LAD stenosis, non-stentable, stable.  2. Stable angina.  3. Hypertension.  4. Generalized anxiety.  5. Hyperlipidemia  6. Type 2 diabetes.  7. Hypokalemia secondary to diuretic   DISCHARGE MEDICATIONS:  1. Aspirin 81 mg p.o. daily.  2. Lisinopril 10 mg p.o. daily.  3. Carvedilol 3.125 mg p.o. daily.  4. Zocor 40 mg p.o. daily.  5. Effexor 75 mg p.o. daily.  6. Valium 2 mg twice a day as needed for anxiety   DISPOSITION AND FOLLOWUP:  Ms. Rominger was discharged home in stable  condition.  She has an appointment with Dr. Liliane Channel in the Outpatient  Clinic on [phone].  She will also schedule a follow up with  Dr. Jacinto Halim, Cardiology, in 2-3 weeks.   PROCEDURES PERFORMED:  1. Cardiac catheterization:  The patient underwent a cardiac cath by      Dr. Yates Decamp, MD, Cardiology.  She was found to have an ejection      fraction of 50-55% on ventriculogram.  She was also found to have      some mild  LAD 60% stenosis with a small plaque rupture, now      stable.  Otherwise, her coronaries were normal.  2. A 2-D echo.  This study showed overall left ventricular systolic      function was at the lower limits of normal.  Ejection fraction was      estimated to be 50%.  There were no regional wall motion      abnormalities, mild aortic regurgitation was noted, mild mitral      valve regurgitation was noted, left ventricle was mildly dilated   CONSULTATION:  Vonna Kotyk R. Jacinto Halim, MD, Cardiology.   BRIEF ADMITTING HISTORY AND PHYSICAL:   Ms. Gauger is a 55 year old woman  with past medical history significant for hypertension and anxiety  disorder who presented to the emergency department with new onset chest  pain.  The chest pain started mid day while she was at work and not  doing any strenuous activity.  She describes the chest pain as sharp,  substernal and lasted only a few minutes and was resolving on its own.  She states the pain was 6/10, mostly pressure with no radiation.  She  has no habit risk factors and no known family history of MI.  She had no  associated symptoms such as nausea, diaphoresis or near syncope.  Please  see the paper chart for complete details of her admission H&P.   ADMISSION VITAL SIGNS:  Temperature 97.4, blood pressure 148/81, pulse  71, respiratory rate 16, O2 sat's 100% on room air.  There  were no gross  abnormal physical exam findings.   ADMISSION LABORATORIES:  Sodium 138, potassium 3.6, chloride 104,  bicarbonate 27.8, BUN 10, creatinine 0.8, glucose 98, hemoglobin 13.9,  hematocrit 41.   HOSPITAL COURSE BY PROBLEM:  #1 - CHEST PAIN:  Ms. Lockner was admitted  with chest pain.  She was monitored on telemetry without any abnormal  events.  There was no evidence of MI by cardiac enzymes.  Noted on  initial EKG were some new T-wave inversions.  She does have risk factors  for coronary artery disease including hypertension and a low HDL.  We  started her on medical management including treatment with aspirin, beta  blocker and a Statin.  Cardiology was consulted for cardiac cath, given  her risk factors and the features of her chest pain as well as the EKG  findings.  Cardiac cath did show a 60% LAD lesion, however, it was not  stentable, and the recommendation was for medical management.   #2 - HYPERTENSION.  Ms. Persad' hypertension was controlled while she  was inpatient on her home medication regimen including lisinopril and a  beta blocker.  Her beta blockade was changed to  Carvedilol given her new  finding of coronary artery disease.  She tolerated this medication  change.   #3 - GENERALIZED ANXIETY DISORDER:  Ms. Para had a significant amount  of anxiety while being treated in the hospital.  She was continued on  her home SSRI, Effexor 75 mg, in addition, she was given p.r.n. Valium.   #4 - HYPERLIPIDEMIA.  Fasting lipid panel was obtained in the hospital,  and Ms. Levenhagen was found to have an elevated total cholesterol 186,  triglycerides 158, HDL 38, LDL 116.  She was started on a Statin for  control of her lipids, in addition to plaque stabilization.   DISCHARGE LABORATORY DATA:  Sodium 136, potassium 4.7, chloride 107,  bicarbonate 28, BUN 17, creatinine 0.7, glucose 87.   DISCHARGE VITAL SIGNS:  Temperature 97.4, pulse 65, blood pressure  136/88, respirations 18.      Edsel Petrin, D.O.  Electronically Signed     ELG/MEDQ  D:  03/09/2007  T:  03/10/2007  Job:  [id number]

## 2011-05-09 NOTE — Op Note (Signed)
NAME:  Catherine [name], Catherine [name] NO.:  [id number]   MEDICAL RECORD NO.:  [id number]                   PATIENT TYPE:  OUT   LOCATION:  MDC                                  FACILITY:  [facility]   PHYSICIAN:  Marlan Palau, M.D.               DATE OF BIRTH:  1956/01/07   DATE OF PROCEDURE:  01/26/2004  DATE OF DISCHARGE:                                 OPERATIVE REPORT   BRIEF HISTORY:  This is a 55 year old patient with multiple white matter  lesions on an MRI scan who comes in with vertigo.  The patient is being  evaluated for small vessel disease versus demyelinating disease.   PROCEDURE:  Lumbar puncture.   DESCRIPTION OF PROCEDURE:  The lumbar puncture was performed with the  patient in the fetal position on the left side.  The low back was cleaned  with Betadine solution and approximately 2 mL of 1% Xylocaine was used for  local anesthetic.  A 20 gauge spinal needle was then inserted into the L3-4  interspace initially with some difficulty, but actually spinal fluid was  obtained.  The spinal fluid was blood tinged initially with rapid clearing.  An opening pressure was not obtained.  The patient had approximately 18 mL  of clear colorless spinal fluid removed for testing.  The patient tolerated  the procedure well and no complications of the above procedure were noted.  Tube #1 was sent for VDRL, cryptococcal antigen and angiotensin converting  enzyme level.  Tube #2 was sent for autoclonal banding and IgG albumin  ratio.  Tube #3 was sent for cells, differential glucose and protein.  Tube  #4 was sent for Lyme and antibody panel.  Blood work was obtained for ANA,  rheumatoid factor, sedimentation rate, antiphospholipid antibody panel and  lupus anticoagulant antibody.  Again, the patient did well with the  procedure.                                               Marlan Palau, M.D.    CKW/MEDQ  D:  01/26/2004  T:  01/26/2004  Job:  [id number]

## 2011-05-09 NOTE — Cardiovascular Report (Signed)
[name], [name]               ACCOUNT NO.:  [id number]   MEDICAL RECORD NO.:  [id number]          PATIENT TYPE:  INP   LOCATION:  3738                         FACILITY:  [facility]   PHYSICIAN:  Cristy Hilts. Jacinto Halim, MD       DATE OF BIRTH:  22-Aug-1956   DATE OF PROCEDURE:  03/01/2007  DATE OF DISCHARGE:                            CARDIAC CATHETERIZATION   PROCEDURE PERFORMED:  1. Left ventriculography.  2. Selective right and left arteriography.  3. Right femoral angiography.  4. Closure of right femoral arterial access with a Star close.   INDICATIONS:  Catherine [name] is a 55 year old female with a history  of hypertension, metabolic syndrome who was admitted with chest pain.  She was brought to the cardiac cath lab to evaluate her coronary  anatomy.   HEMODYNAMIC DATA:  The left ventricular pressures were 135/5 with an end  diastolic pressure of 16-mmHg.  The aortic pressure 135/75 with a mean  of 104-mmHg.  There was no significant pressure gradient across the  aortic valve.   ANGIOGRAPHIC DATA:  Left ventricle. The left ventricular systolic  function was normal with an ejection fraction of 50-55%, with mild  global hypokinesis.  There was no significant mitral regurgitation.   Right coronary artery.  The right coronary artery is a large dominant  vessel.  It is smooth and normal.   Left main. The left main is large vessel, smooth and normal.   Circumflex. The circumflex is a large vessel.  It gives origin to a  large obtuse marginal-one and continues in the AV groove and gives  origin to obtuse marginal-two.  The circumflex is normal.   LAD.  The LAD is a large-caliber vessel in the proximal segment.  There  is a 60% stenosis noted just distal to the origin of a diagonal-two.  This is also minimally intramyocardial.  This is hazy.  However, the  distal LAD is very small and measures at most 2.25-mm in diameter.  The  LAD gives origin to a moderate-sized diagonal and  moderate-sized  diagonal-two in the proximal and mid segments.  The LAD ends at the  apex.   FINAL IMPRESSION:  1. Low/normal left ventricular systolic function, ejection fraction is      50-55%, with mild global hypokinesis.  2. A hazy 60% mid left anterior descending artery stenosis which is      also minimally intramyocardial.  The distal LAD is very small and      measures at most 2.25-mm at the stenotic site.  Otherwise, the      coronary arteries appeared to be smooth and normal.   RECOMMENDATIONS:  Based on the coronary anatomy, I will continue  aggressive risk modification.  The patient is presently chest pain free  and although the LAD appears to be hazy, it does not appear to have any  thrombus or any dissection.  And, there is good flow noted through the  artery and also it is a small vessel.  Given this, continued aggressive  risk modification is indicated.  The patient can be discharged home and  I will perform an outpatient stress Myoview in about 2-3 weeks, and if  the LAD shows up as ischemic and if she continues to have recurrent  chest discomfort, then would an angioplasty to her LAD can be  considered.   A total of 110 mL of contrast was utilized for diagnostic angiography.   TECHNIQUE OF PROCEDURE:  Under usual sterile precautions using a 6-  French right arterial access, a 6-French multipurpose B2 catheter was  advanced into the ascending aorta over a 0.035-inch J-wire.  The  catheter was gently advanced into the left ventricle.  Left ventricle  pressure monitored.  Hand contrast injection of the left ventricle was  performed both in LAO and RAO projection.  The catheter was flushed with  saline and pulled back into the ascending aorta and pressure gradient  across the aortic valve was monitored.  The right coronary artery was  selectively engaged and angiography was performed.  Then the left main  coronary selectively engaged and angiography was performed.   Then the  catheter was pulled out of the body in the usual fashion.  A right  femoral angiography was performed through the arterial access sheath and  the access was closed with a Star close with excellent hemostasis.  The  patient tolerated the procedure.  No immediate complications noted.      Cristy Hilts. Jacinto Halim, MD  Electronically Signed     JRG/MEDQ  D:  03/01/2007  T:  03/01/2007  Job:  [id number]   cc:   Phillips Odor, Dr.

## 2011-05-31 ENCOUNTER — Inpatient Hospital Stay (INDEPENDENT_AMBULATORY_CARE_PROVIDER_SITE_OTHER)
Admission: RE | Admit: 2011-05-31 | Discharge: 2011-05-31 | Disposition: A | Discharge status: Home / Self Care | Payer: Medicaid Other | Source: Ambulatory Visit | Attending: Emergency Medicine | Admitting: Emergency Medicine

## 2011-05-31 DIAGNOSIS — N760 Acute vaginitis: Secondary | ICD-10-CM

## 2011-05-31 LAB — WET PREP, GENITAL: Trich, Wet Prep: NONE SEEN

## 2011-05-31 LAB — POCT URINALYSIS DIP (DEVICE)
Bilirubin Urine: NEGATIVE
Glucose, UA: NEGATIVE mg/dL
Hgb urine dipstick: NEGATIVE
Ketones, ur: NEGATIVE mg/dL
Nitrite: NEGATIVE
Protein, ur: NEGATIVE mg/dL
Specific Gravity, Urine: 1.015 (ref 1.005–1.030)
Urobilinogen, UA: 1 mg/dL (ref 0.0–1.0)
pH: 6.5 (ref 5.0–8.0)

## 2011-06-03 LAB — GC/CHLAMYDIA PROBE AMP, GENITAL
Chlamydia, DNA Probe: NEGATIVE
GC Probe Amp, Genital: NEGATIVE

## 2011-07-04 ENCOUNTER — Emergency Department (HOSPITAL_COMMUNITY)
Admission: EM | Admit: 2011-07-04 | Discharge: 2011-07-04 | Disposition: A | Discharge status: Home / Self Care | Payer: Medicare Other | Attending: Emergency Medicine | Admitting: Emergency Medicine

## 2011-07-04 DIAGNOSIS — Z79899 Other long term (current) drug therapy: Secondary | ICD-10-CM | POA: Insufficient documentation

## 2011-07-04 DIAGNOSIS — E7800 Pure hypercholesterolemia, unspecified: Secondary | ICD-10-CM | POA: Insufficient documentation

## 2011-07-04 DIAGNOSIS — M546 Pain in thoracic spine: Secondary | ICD-10-CM | POA: Insufficient documentation

## 2011-07-04 DIAGNOSIS — I2510 Atherosclerotic heart disease of native coronary artery without angina pectoris: Secondary | ICD-10-CM | POA: Insufficient documentation

## 2011-07-04 DIAGNOSIS — I10 Essential (primary) hypertension: Secondary | ICD-10-CM | POA: Insufficient documentation

## 2011-07-04 DIAGNOSIS — R110 Nausea: Secondary | ICD-10-CM | POA: Insufficient documentation

## 2011-07-04 LAB — URINALYSIS, ROUTINE W REFLEX MICROSCOPIC
Bilirubin Urine: NEGATIVE
Glucose, UA: NEGATIVE mg/dL
Hgb urine dipstick: NEGATIVE
Ketones, ur: NEGATIVE mg/dL
Leukocytes, UA: NEGATIVE
Nitrite: NEGATIVE
Protein, ur: NEGATIVE mg/dL
Specific Gravity, Urine: 1.01 (ref 1.005–1.030)
Urobilinogen, UA: 0.2 mg/dL (ref 0.0–1.0)
pH: 6.5 (ref 5.0–8.0)

## 2011-07-04 LAB — POCT I-STAT, CHEM 8
BUN: 15 mg/dL (ref 6–23)
Calcium, Ion: 1.2 mmol/L (ref 1.12–1.32)
Chloride: 105 mEq/L (ref 96–112)
Creatinine, Ser: 0.9 mg/dL (ref 0.50–1.10)
Glucose, Bld: 110 mg/dL — ABNORMAL HIGH (ref 70–99)
HCT: 41 % (ref 36.0–46.0)
Hemoglobin: 13.9 g/dL (ref 12.0–15.0)
Potassium: 3.9 mEq/L (ref 3.5–5.1)
Sodium: 141 mEq/L (ref 135–145)
TCO2: 25 mmol/L (ref 0–100)

## 2011-07-30 ENCOUNTER — Emergency Department (HOSPITAL_COMMUNITY)
Admission: EM | Admit: 2011-07-30 | Discharge: 2011-07-31 | Disposition: A | Discharge status: Home / Self Care | Payer: Medicare Other | Attending: Emergency Medicine | Admitting: Emergency Medicine

## 2011-07-30 DIAGNOSIS — F341 Dysthymic disorder: Secondary | ICD-10-CM | POA: Insufficient documentation

## 2011-07-30 DIAGNOSIS — E7800 Pure hypercholesterolemia, unspecified: Secondary | ICD-10-CM | POA: Insufficient documentation

## 2011-07-30 DIAGNOSIS — K219 Gastro-esophageal reflux disease without esophagitis: Secondary | ICD-10-CM | POA: Insufficient documentation

## 2011-07-30 DIAGNOSIS — I10 Essential (primary) hypertension: Secondary | ICD-10-CM | POA: Insufficient documentation

## 2011-07-30 DIAGNOSIS — R1032 Left lower quadrant pain: Secondary | ICD-10-CM | POA: Insufficient documentation

## 2011-07-30 DIAGNOSIS — Z79899 Other long term (current) drug therapy: Secondary | ICD-10-CM | POA: Insufficient documentation

## 2011-07-30 DIAGNOSIS — I2510 Atherosclerotic heart disease of native coronary artery without angina pectoris: Secondary | ICD-10-CM | POA: Insufficient documentation

## 2011-07-30 HISTORY — DX: Essential (primary) hypertension: I10

## 2011-07-30 LAB — DIFFERENTIAL
Basophils Absolute: 0 10*3/uL (ref 0.0–0.1)
Basophils Relative: 0 % (ref 0–1)
Eosinophils Absolute: 0.1 10*3/uL (ref 0.0–0.7)
Eosinophils Relative: 2 % (ref 0–5)
Lymphocytes Relative: 28 % (ref 12–46)
Lymphs Abs: 1.5 10*3/uL (ref 0.7–4.0)
Monocytes Absolute: 0.3 10*3/uL (ref 0.1–1.0)
Monocytes Relative: 6 % (ref 3–12)
Neutro Abs: 3.4 10*3/uL (ref 1.7–7.7)
Neutrophils Relative %: 63 % (ref 43–77)

## 2011-07-30 LAB — BASIC METABOLIC PANEL
BUN: 10 mg/dL (ref 6–23)
CO2: 26 mEq/L (ref 19–32)
Calcium: 10 mg/dL (ref 8.4–10.5)
Chloride: 104 mEq/L (ref 96–112)
Creatinine, Ser: 0.86 mg/dL (ref 0.50–1.10)
GFR calc Af Amer: >60 mL/min (ref >60)
GFR calc non Af Amer: >60 mL/min (ref >60)
Glucose, Bld: 92 mg/dL (ref 70–99)
Potassium: 3.3 mEq/L — ABNORMAL LOW (ref 3.5–5.1)
Sodium: 141 mEq/L (ref 135–145)

## 2011-07-30 LAB — CBC
HCT: 37.9 % (ref 36.0–46.0)
Hemoglobin: 12.5 g/dL (ref 12.0–15.0)
MCH: 27.2 pg (ref 26.0–34.0)
MCHC: 33 g/dL (ref 30.0–36.0)
MCV: 82.6 fL (ref 78.0–100.0)
Platelets: 249 10*3/uL (ref 150–400)
RBC: 4.59 MIL/uL (ref 3.87–5.11)
RDW: 13.6 % (ref 11.5–15.5)
WBC: 5.4 10*3/uL (ref 4.0–10.5)

## 2011-07-30 LAB — URINALYSIS, ROUTINE W REFLEX MICROSCOPIC
Bilirubin Urine: NEGATIVE
Glucose, UA: NEGATIVE mg/dL
Hgb urine dipstick: NEGATIVE
Ketones, ur: NEGATIVE mg/dL
Leukocytes, UA: NEGATIVE
Nitrite: NEGATIVE
Protein, ur: NEGATIVE mg/dL
Specific Gravity, Urine: 1.011 (ref 1.005–1.030)
Urobilinogen, UA: 0.2 mg/dL (ref 0.0–1.0)
pH: 7 (ref 5.0–8.0)

## 2011-07-31 ENCOUNTER — Encounter (HOSPITAL_COMMUNITY): Payer: Self-pay | Admitting: Radiology

## 2011-07-31 ENCOUNTER — Emergency Department (HOSPITAL_COMMUNITY): Payer: Medicare Other

## 2011-07-31 MED ORDER — IOHEXOL 300 MG/ML  SOLN
100.0000 mL | Freq: Once | INTRAMUSCULAR | Status: AC | PRN
  Administered 2011-07-31: 100 mL via INTRAVENOUS

## 2011-08-31 ENCOUNTER — Other Ambulatory Visit: Payer: Self-pay | Admitting: Internal Medicine

## 2011-09-01 ENCOUNTER — Telehealth: Payer: Self-pay | Admitting: *Deleted

## 2011-09-01 NOTE — Telephone Encounter (Signed)
PT WAS DISCH FROM [facility]

## 2011-09-10 LAB — I-STAT 8, (EC8 V) (CONVERTED LAB)
Acid-Base Excess: 2
BUN: 5 — ABNORMAL LOW
Bicarbonate: 27.3 — ABNORMAL HIGH
Chloride: 104
Glucose, Bld: 108 — ABNORMAL HIGH
HCT: 42
Hemoglobin: 14.3
Operator id: 288831
Potassium: 3.2 — ABNORMAL LOW
Sodium: 139
TCO2: 29
pCO2, Ven: 43.7 — ABNORMAL LOW
pH, Ven: 7.404 — ABNORMAL HIGH

## 2011-09-10 LAB — POCT CARDIAC MARKERS
CKMB, poc: 1.2
Myoglobin, poc: 72.2
Operator id: 288831
Troponin i, poc: <0.05

## 2011-09-10 LAB — POCT I-STAT CREATININE
Creatinine, Ser: 0.9
Operator id: 288831

## 2011-09-12 LAB — URINALYSIS, ROUTINE W REFLEX MICROSCOPIC
Bilirubin Urine: NEGATIVE
Glucose, UA: NEGATIVE
Hgb urine dipstick: NEGATIVE
Ketones, ur: NEGATIVE
Nitrite: NEGATIVE
Protein, ur: NEGATIVE
Specific Gravity, Urine: 1.007
Urobilinogen, UA: 0.2
pH: 6

## 2011-09-12 LAB — URINE MICROSCOPIC-ADD ON

## 2011-09-15 LAB — POCT CARDIAC MARKERS
CKMB, poc: <1 — ABNORMAL LOW
CKMB, poc: 1.8
CKMB, poc: 2.2
Myoglobin, poc: 70.1
Myoglobin, poc: 68.1
Myoglobin, poc: 88.8
Operator id: 222501
Operator id: 277751
Operator id: 277751
Troponin i, poc: <0.05
Troponin i, poc: <0.05
Troponin i, poc: <0.05

## 2011-09-15 LAB — CBC
HCT: 34.7 — ABNORMAL LOW
HCT: 33.2 — ABNORMAL LOW
Hemoglobin: 11.6 — ABNORMAL LOW
Hemoglobin: 11.2 — ABNORMAL LOW
MCHC: 33.5
MCHC: 33.7
MCV: 83
MCV: 81.3
Platelets: 271
Platelets: 368
RBC: 4.18
RBC: 4.08
RDW: 14.1
RDW: 13.6
WBC: 3.9 — ABNORMAL LOW
WBC: 5.9

## 2011-09-15 LAB — I-STAT 8, (EC8 V) (CONVERTED LAB)
Acid-Base Excess: 1
BUN: 8
Bicarbonate: 24.7 — ABNORMAL HIGH
Chloride: 106
Glucose, Bld: 98
HCT: 39
Hemoglobin: 13.3
Operator id: 222501
Potassium: 3.2 — ABNORMAL LOW
Sodium: 140
TCO2: 26
pCO2, Ven: 35.3 — ABNORMAL LOW
pH, Ven: 7.453 — ABNORMAL HIGH

## 2011-09-15 LAB — DIFFERENTIAL
Basophils Absolute: 0
Basophils Absolute: 0.2 — ABNORMAL HIGH
Basophils Relative: 0
Basophils Relative: 3 — ABNORMAL HIGH
Eosinophils Absolute: 0.1
Eosinophils Absolute: 0.1
Eosinophils Relative: 3
Eosinophils Relative: 2
Lymphocytes Relative: 37
Lymphocytes Relative: 33
Lymphs Abs: 1.4
Lymphs Abs: 1.9
Monocytes Absolute: 0.4
Monocytes Absolute: 0.5
Monocytes Relative: 11
Monocytes Relative: 9
Neutro Abs: 1.9
Neutro Abs: 3.1
Neutrophils Relative %: 49
Neutrophils Relative %: 53

## 2011-09-15 LAB — POCT I-STAT, CHEM 8
BUN: 12
Calcium, Ion: 1.2
Chloride: 100
Creatinine, Ser: 1.4 — ABNORMAL HIGH
Glucose, Bld: 97
HCT: 35 — ABNORMAL LOW
Hemoglobin: 11.9 — ABNORMAL LOW
Potassium: 2.9 — ABNORMAL LOW
Sodium: 138
TCO2: 29

## 2011-09-15 LAB — POCT I-STAT CREATININE
Creatinine, Ser: 1.1
Operator id: 222501

## 2011-09-15 LAB — D-DIMER, QUANTITATIVE (NOT AT ARMC): D-Dimer, Quant: 3.3 — ABNORMAL HIGH

## 2011-09-16 LAB — OCCULT BLOOD X 1 CARD TO LAB, STOOL
Fecal Occult Bld: POSITIVE
Fecal Occult Bld: POSITIVE

## 2011-09-16 LAB — CBC
HCT: 32.2 — ABNORMAL LOW
HCT: 32.5 — ABNORMAL LOW
HCT: 34.4 — ABNORMAL LOW
Hemoglobin: 10.9 — ABNORMAL LOW
Hemoglobin: 10.9 — ABNORMAL LOW
Hemoglobin: 11.7 — ABNORMAL LOW
MCHC: 33.5
MCHC: 33.7
MCHC: 34
MCV: 81.5
MCV: 81.8
MCV: 82.1
Platelets: 273
Platelets: 284
Platelets: 294
RBC: 3.94
RBC: 3.97
RBC: 4.22
RDW: 14.2
RDW: 14.4
RDW: 14.4
WBC: 3.7 — ABNORMAL LOW
WBC: 3.9 — ABNORMAL LOW
WBC: 4

## 2011-09-16 LAB — URINALYSIS, ROUTINE W REFLEX MICROSCOPIC
Bilirubin Urine: NEGATIVE
Bilirubin Urine: NEGATIVE
Glucose, UA: NEGATIVE
Glucose, UA: NEGATIVE
Hgb urine dipstick: NEGATIVE
Hgb urine dipstick: NEGATIVE
Ketones, ur: NEGATIVE
Ketones, ur: NEGATIVE
Nitrite: NEGATIVE
Nitrite: NEGATIVE
Protein, ur: NEGATIVE
Protein, ur: NEGATIVE
Specific Gravity, Urine: 1.005
Specific Gravity, Urine: 1.007
Urobilinogen, UA: 0.2
Urobilinogen, UA: 0.2
pH: 6
pH: 6

## 2011-09-16 LAB — BASIC METABOLIC PANEL
BUN: 12
CO2: 26
Calcium: 10
Chloride: 103
Creatinine, Ser: 1.26 — ABNORMAL HIGH
GFR calc Af Amer: 54 — ABNORMAL LOW
GFR calc non Af Amer: 44 — ABNORMAL LOW
Glucose, Bld: 89
Potassium: 3.9
Sodium: 138

## 2011-09-16 LAB — DIFFERENTIAL
Basophils Absolute: 0
Basophils Absolute: 0
Basophils Absolute: 0
Basophils Relative: 1
Basophils Relative: 1
Basophils Relative: 1
Eosinophils Absolute: 0.1
Eosinophils Absolute: 0.1
Eosinophils Absolute: 0.1
Eosinophils Relative: 2
Eosinophils Relative: 2
Eosinophils Relative: 2
Lymphocytes Relative: 34
Lymphocytes Relative: 35
Lymphocytes Relative: 40
Lymphs Abs: 1.4
Lymphs Abs: 1.4
Lymphs Abs: 1.5
Monocytes Absolute: 0.4
Monocytes Absolute: 0.5
Monocytes Absolute: 0.5
Monocytes Relative: 12
Monocytes Relative: 12
Monocytes Relative: 12
Neutro Abs: 1.7
Neutro Abs: 2
Neutro Abs: 2
Neutrophils Relative %: 45
Neutrophils Relative %: 50
Neutrophils Relative %: 51

## 2011-09-16 LAB — POCT I-STAT, CHEM 8
BUN: 15
BUN: 15
Calcium, Ion: 1.23
Calcium, Ion: 1.25
Chloride: 102
Chloride: 104
Creatinine, Ser: 1.5 — ABNORMAL HIGH
Creatinine, Ser: 1.7 — ABNORMAL HIGH
Glucose, Bld: 79
Glucose, Bld: 99
HCT: 34 — ABNORMAL LOW
HCT: 37
Hemoglobin: 11.6 — ABNORMAL LOW
Hemoglobin: 12.6
Potassium: 3.6
Potassium: 3.7
Sodium: 137
Sodium: 141
TCO2: 25
TCO2: 26

## 2011-09-16 LAB — POCT CARDIAC MARKERS
CKMB, poc: 1.5
Myoglobin, poc: 99.1
Operator id: 294521
Troponin i, poc: <0.05

## 2011-09-17 LAB — CBC
HCT: 28.9 — ABNORMAL LOW
HCT: 29.8 — ABNORMAL LOW
Hemoglobin: 10.4 — ABNORMAL LOW
Hemoglobin: 9.6 — ABNORMAL LOW
MCHC: 33.2
MCHC: 34.7
MCV: 81.6
MCV: 82.7
Platelets: 216
Platelets: 237
RBC: 3.49 — ABNORMAL LOW
RBC: 3.65 — ABNORMAL LOW
RDW: 14.3
RDW: 14.8
WBC: 3.6 — ABNORMAL LOW
WBC: 4.6

## 2011-09-17 LAB — CARDIAC PANEL(CRET KIN+CKTOT+MB+TROPI)
CK, MB: 1
CK, MB: 1
Relative Index: INVALID
Relative Index: INVALID
Total CK: 47
Total CK: 55
Troponin I: 0.03
Troponin I: 0.03

## 2011-09-17 LAB — COMPREHENSIVE METABOLIC PANEL
ALT: 13
AST: 21
Albumin: 3.9
Alkaline Phosphatase: 52
BUN: 11
CO2: 24
Calcium: 9.6
Chloride: 106
Creatinine, Ser: 1.13
GFR calc Af Amer: >60
GFR calc non Af Amer: 50 — ABNORMAL LOW
Glucose, Bld: 72
Potassium: 3.5
Sodium: 139
Total Bilirubin: 0.6
Total Protein: 7.6

## 2011-09-17 LAB — BASIC METABOLIC PANEL
BUN: 14
CO2: 25
Calcium: 9.4
Chloride: 108
Creatinine, Ser: 1.35 — ABNORMAL HIGH
GFR calc Af Amer: 50 — ABNORMAL LOW
GFR calc non Af Amer: 41 — ABNORMAL LOW
Glucose, Bld: 80
Potassium: 3.4 — ABNORMAL LOW
Sodium: 141

## 2011-09-17 LAB — PROTIME-INR
INR: 1
INR: 1
Prothrombin Time: 13.5
Prothrombin Time: 13.7

## 2011-09-17 LAB — FERRITIN: Ferritin: 169 (ref 10–291)

## 2011-09-17 LAB — POCT I-STAT, CHEM 8
BUN: 13
Calcium, Ion: 1.19
Chloride: 106
Creatinine, Ser: 1.5 — ABNORMAL HIGH
Glucose, Bld: 83
HCT: 32 — ABNORMAL LOW
Hemoglobin: 10.9 — ABNORMAL LOW
Potassium: 3.4 — ABNORMAL LOW
Sodium: 141
TCO2: 23

## 2011-09-17 LAB — POCT CARDIAC MARKERS
CKMB, poc: 1.2
CKMB, poc: 1.3
Myoglobin, poc: 74.6
Myoglobin, poc: 81.7
Operator id: 264421
Operator id: 285841
Troponin i, poc: <0.05
Troponin i, poc: <0.05

## 2011-09-17 LAB — CK TOTAL AND CKMB (NOT AT ARMC)
CK, MB: 1.4
Relative Index: INVALID
Total CK: 61

## 2011-09-17 LAB — LIPID PANEL
Cholesterol: 129
HDL: 36 — ABNORMAL LOW
LDL Cholesterol: 80
Total CHOL/HDL Ratio: 3.6
Triglycerides: 67
VLDL: 13

## 2011-09-17 LAB — DIFFERENTIAL
Basophils Absolute: 0
Basophils Relative: 1
Eosinophils Absolute: 0.1
Eosinophils Relative: 2
Lymphocytes Relative: 27
Lymphs Abs: 1
Monocytes Absolute: 0.4
Monocytes Relative: 12
Neutro Abs: 2.1
Neutrophils Relative %: 59

## 2011-09-17 LAB — APTT: aPTT: 24

## 2011-09-17 LAB — HEPARIN LEVEL (UNFRACTIONATED): Heparin Unfractionated: 0.92 — ABNORMAL HIGH

## 2011-09-17 LAB — IRON: Iron: 65

## 2011-09-17 LAB — TROPONIN I: Troponin I: 0.03

## 2011-09-17 LAB — VITAMIN B12: Vitamin B-12: 636 (ref 211–911)

## 2011-09-17 LAB — MAGNESIUM: Magnesium: 2.1

## 2011-09-17 LAB — TSH: TSH: 1.695

## 2011-09-17 LAB — D-DIMER, QUANTITATIVE (NOT AT ARMC): D-Dimer, Quant: 3.54 — ABNORMAL HIGH

## 2011-09-17 LAB — B-NATRIURETIC PEPTIDE (CONVERTED LAB): Pro B Natriuretic peptide (BNP): <30

## 2011-09-17 LAB — FOLATE: Folate: 18.9

## 2011-09-30 LAB — CARDIAC PANEL(CRET KIN+CKTOT+MB+TROPI)
CK, MB: 1.2
CK, MB: 1.5
Relative Index: INVALID
Relative Index: INVALID
Total CK: 54
Total CK: 71
Troponin I: 0.01
Troponin I: 0.01

## 2011-09-30 LAB — COMPREHENSIVE METABOLIC PANEL
ALT: 14
AST: 22
Albumin: 4
Alkaline Phosphatase: 61
BUN: 9
CO2: 26
Calcium: 9.6
Chloride: 97
Creatinine, Ser: 0.92
GFR calc Af Amer: >60
GFR calc non Af Amer: >60
Glucose, Bld: 94
Potassium: 2.9 — ABNORMAL LOW
Sodium: 132 — ABNORMAL LOW
Total Bilirubin: 0.6
Total Protein: 8.6 — ABNORMAL HIGH

## 2011-09-30 LAB — URINALYSIS, ROUTINE W REFLEX MICROSCOPIC
Bilirubin Urine: NEGATIVE
Glucose, UA: NEGATIVE
Hgb urine dipstick: NEGATIVE
Ketones, ur: NEGATIVE
Nitrite: NEGATIVE
Protein, ur: NEGATIVE
Specific Gravity, Urine: 1.015
Urobilinogen, UA: 1
pH: 6

## 2011-09-30 LAB — BASIC METABOLIC PANEL
BUN: 9
BUN: 9
BUN: 9
CO2: 25
CO2: 26
CO2: 26
Calcium: 8.7
Calcium: 9.3
Calcium: 9.8
Chloride: 102
Chloride: 103
Chloride: 104
Creatinine, Ser: 0.86
Creatinine, Ser: 0.87
Creatinine, Ser: 0.89
GFR calc Af Amer: >60
GFR calc Af Amer: >60
GFR calc Af Amer: >60
GFR calc non Af Amer: >60
GFR calc non Af Amer: >60
GFR calc non Af Amer: >60
Glucose, Bld: 81
Glucose, Bld: 91
Glucose, Bld: 96
Potassium: 3.5
Potassium: 3.5
Potassium: 3.8
Sodium: 136
Sodium: 136
Sodium: 139

## 2011-09-30 LAB — TROPONIN I: Troponin I: 0.01

## 2011-09-30 LAB — ACETAMINOPHEN LEVEL: Acetaminophen (Tylenol), Serum: <10 — ABNORMAL LOW

## 2011-09-30 LAB — CBC
HCT: 30.6 — ABNORMAL LOW
HCT: 35.3 — ABNORMAL LOW
Hemoglobin: 10.4 — ABNORMAL LOW
Hemoglobin: 12
MCHC: 33.8
MCHC: 33.9
MCV: 81.3
MCV: 82.4
Platelets: 262
Platelets: 367
RBC: 3.72 — ABNORMAL LOW
RBC: 4.34
RDW: 13.1
RDW: 13.4
WBC: 3.9 — ABNORMAL LOW
WBC: 4.7

## 2011-09-30 LAB — POCT I-STAT CREATININE
Creatinine, Ser: 1.2
Operator id: 294501

## 2011-09-30 LAB — PROTIME-INR
INR: 1
Prothrombin Time: 13.3

## 2011-09-30 LAB — I-STAT 8, (EC8 V) (CONVERTED LAB)
Acid-Base Excess: 5 — ABNORMAL HIGH
BUN: 11
Bicarbonate: 29.2 — ABNORMAL HIGH
Chloride: 105
Glucose, Bld: 104 — ABNORMAL HIGH
HCT: 42
Hemoglobin: 14.3
Operator id: 294501
Potassium: 3.8
Sodium: 139
TCO2: 30
pCO2, Ven: 41.3 — ABNORMAL LOW
pH, Ven: 7.458 — ABNORMAL HIGH

## 2011-09-30 LAB — D-DIMER, QUANTITATIVE (NOT AT ARMC): D-Dimer, Quant: 3.04 — ABNORMAL HIGH

## 2011-09-30 LAB — CK TOTAL AND CKMB (NOT AT ARMC)
CK, MB: 2.1
Relative Index: INVALID
Total CK: 79

## 2011-09-30 LAB — RAPID URINE DRUG SCREEN, HOSP PERFORMED
Amphetamines: NOT DETECTED
Barbiturates: NOT DETECTED
Benzodiazepines: POSITIVE — AB
Cocaine: NOT DETECTED
Opiates: NOT DETECTED
Tetrahydrocannabinol: NOT DETECTED

## 2011-09-30 LAB — DIFFERENTIAL
Basophils Absolute: 0
Basophils Relative: 1
Eosinophils Absolute: 0.1 — ABNORMAL LOW
Eosinophils Relative: 2
Lymphocytes Relative: 35
Lymphs Abs: 1.7
Monocytes Absolute: 0.4
Monocytes Relative: 8
Neutro Abs: 2.6
Neutrophils Relative %: 54

## 2011-09-30 LAB — MAGNESIUM: Magnesium: 2.1

## 2011-09-30 LAB — TSH: TSH: 5.28

## 2011-09-30 LAB — POCT CARDIAC MARKERS
CKMB, poc: 1.5
Myoglobin, poc: 88.1
Operator id: 294501
Troponin i, poc: <0.05

## 2011-09-30 LAB — URINE CULTURE: Colony Count: 30000

## 2011-09-30 LAB — HEMOGLOBIN A1C
Hgb A1c MFr Bld: 5.9
Mean Plasma Glucose: 133

## 2011-09-30 LAB — LIPASE, BLOOD: Lipase: 42

## 2011-09-30 LAB — APTT: aPTT: 28

## 2011-09-30 LAB — B-NATRIURETIC PEPTIDE (CONVERTED LAB): Pro B Natriuretic peptide (BNP): <30

## 2011-09-30 LAB — URINE MICROSCOPIC-ADD ON

## 2011-09-30 LAB — SALICYLATE LEVEL: Salicylate Lvl: <4

## 2011-10-11 ENCOUNTER — Inpatient Hospital Stay (INDEPENDENT_AMBULATORY_CARE_PROVIDER_SITE_OTHER)
Admission: RE | Admit: 2011-10-11 | Discharge: 2011-10-11 | Disposition: A | Discharge status: Home / Self Care | Payer: Medicare Other | Source: Ambulatory Visit | Attending: Emergency Medicine | Admitting: Emergency Medicine

## 2011-10-11 DIAGNOSIS — M65839 Other synovitis and tenosynovitis, unspecified forearm: Secondary | ICD-10-CM

## 2011-10-11 DIAGNOSIS — M65849 Other synovitis and tenosynovitis, unspecified hand: Secondary | ICD-10-CM

## 2012-07-29 ENCOUNTER — Encounter (HOSPITAL_COMMUNITY): Payer: Self-pay | Admitting: Adult Health

## 2012-07-29 ENCOUNTER — Emergency Department (HOSPITAL_COMMUNITY)
Admission: EM | Admit: 2012-07-29 | Discharge: 2012-07-29 | Disposition: A | Discharge status: Home / Self Care | Payer: Medicare Other | Attending: Emergency Medicine | Admitting: Emergency Medicine

## 2012-07-29 DIAGNOSIS — IMO0002 Reserved for concepts with insufficient information to code with codable children: Secondary | ICD-10-CM | POA: Insufficient documentation

## 2012-07-29 DIAGNOSIS — X58XXXA Exposure to other specified factors, initial encounter: Secondary | ICD-10-CM | POA: Insufficient documentation

## 2012-07-29 DIAGNOSIS — I10 Essential (primary) hypertension: Secondary | ICD-10-CM | POA: Insufficient documentation

## 2012-07-29 DIAGNOSIS — I2510 Atherosclerotic heart disease of native coronary artery without angina pectoris: Secondary | ICD-10-CM | POA: Insufficient documentation

## 2012-07-29 DIAGNOSIS — T148XXA Other injury of unspecified body region, initial encounter: Secondary | ICD-10-CM

## 2012-07-29 HISTORY — DX: Atherosclerotic heart disease of native coronary artery without angina pectoris: I25.10

## 2012-07-29 MED ORDER — BACITRACIN-NEOMYCIN-POLYMYXIN OINTMENT TUBE: 1.0000 "application " | TOPICAL_OINTMENT | Freq: Every day | CUTANEOUS | Status: DC

## 2012-07-29 MED ORDER — BACITRACIN-NEOMYCIN-POLYMYXIN OINTMENT TUBE
TOPICAL_OINTMENT | Freq: Once | CUTANEOUS | Status: AC
  Administered 2012-07-29: 15 via TOPICAL
  Filled 2012-07-29: qty 15

## 2012-07-29 NOTE — ED Provider Notes (Signed)
History     CSN: [id number]  Arrival date & time 07/29/12  2045   First MD Initiated Contact with Patient 07/29/12 2232      Chief Complaint  Patient presents with  . Bleeding/Bruising    (Consider location/radiation/quality/duration/timing/severity/associated sxs/prior treatment) HPI Comments: Patient states she was sweating, and she was scratching between her breasts when she noticed a slight amount of bleeding.  It appears that she scratched a small superficial mole open  The history is provided by the patient.    Past Medical History  Diagnosis Date  . Hypertension   . CAD (coronary artery disease)     History reviewed. No pertinent past surgical history.  History reviewed. No pertinent family history.  History  Substance Use Topics  . Smoking status: Not on file  . Smokeless tobacco: Not on file  . Alcohol Use:     OB History    Grav Para Term Preterm Abortions TAB SAB Ect Mult Living                  Review of Systems  Constitutional: Negative for fever and chills.  Skin: Positive for wound.  Neurological: Negative for dizziness and weakness.    Allergies  Ciprofloxacin  Home Medications   Current Outpatient Rx  Name Route Sig Dispense Refill  . AMLODIPINE BESYLATE 10 MG PO TABS Oral Take 10 mg by mouth every morning.    . ASPIRIN 81 MG PO CHEW Oral Chew 81 mg by mouth daily.    Marland Kitchen CARVEDILOL 6.25 MG PO TABS Oral Take 6.25 mg by mouth 2 (two) times daily with a meal.    . MECLIZINE HCL 25 MG PO TABS Oral Take 25 mg by mouth daily as needed. For dizziness    . OMEPRAZOLE 20 MG PO CPDR Oral Take 20 mg by mouth daily before breakfast.    . ROSUVASTATIN CALCIUM 10 MG PO TABS Oral Take 10 mg by mouth at bedtime.    . VENLAFAXINE HCL ER 75 MG PO CP24 Oral Take 75 mg by mouth every morning.      BP 131/77  Pulse 83  Temp 98.6 F (37 C) (Oral)  Resp 16  SpO2 98%  Physical Exam  Constitutional: She appears well-developed.  HENT:  Head:  Normocephalic.  Eyes: Pupils are equal, round, and reactive to light.  Neck: Normal range of motion.  Cardiovascular: Normal rate.   Pulmonary/Chest: Effort normal.  Skin:       Small superficial mole on the surface of the skin between her breasts with a small amount of oozing.  No surrounding erythema    ED Course  Procedures (including critical care time)  Labs Reviewed - No data to display No results found.   No diagnosis found.    MDM   Patient scratched a small mole off the anterior portion of her chest between her breasts.  There is a moderate amount of scant bleeding.  Will plan is for an cover with a dressing        Arman Filter, NP 08/06/12 2122

## 2012-07-29 NOTE — ED Notes (Signed)
Pt reports she was scratching herself when she accidentally scratched a mole off then she started bleeding and couldn't get the site to stop bleeding.

## 2012-07-29 NOTE — ED Notes (Signed)
While pt was scratching inbetween breasts she states she scratched a mole off and it has not stopped bleeding. Small abraision noted. Bleeding controlled

## 2012-07-29 NOTE — ED Notes (Signed)
Patient given discharge paperwork; went over discharge instructions with patient.  Patient instructed to apply neosporin as directed and to keep area covered with band-aid.  Patient instructed to follow up with primary care physicians if symptoms do not resolve, and to return to the ED for new, worsening, or concerning symptoms.

## 2012-08-07 NOTE — ED Provider Notes (Signed)
Medical screening examination/treatment/procedure(s) were performed by non-physician practitioner and as supervising physician I was immediately available for consultation/collaboration.  Glynn Octave, MD 08/07/12 2146

## 2012-11-05 ENCOUNTER — Ambulatory Visit
Admission: RE | Admit: 2012-11-05 | Discharge: 2012-11-05 | Disposition: A | Discharge status: Home / Self Care | Payer: Medicare Other | Source: Ambulatory Visit | Attending: Family Medicine | Admitting: Family Medicine

## 2012-11-05 ENCOUNTER — Other Ambulatory Visit: Payer: Self-pay | Admitting: Family Medicine

## 2012-11-05 DIAGNOSIS — R05 Cough: Secondary | ICD-10-CM

## 2012-11-05 DIAGNOSIS — R053 Chronic cough: Secondary | ICD-10-CM

## 2012-11-17 ENCOUNTER — Other Ambulatory Visit: Payer: Self-pay | Admitting: Family Medicine

## 2012-11-17 DIAGNOSIS — R059 Cough, unspecified: Secondary | ICD-10-CM

## 2012-11-17 DIAGNOSIS — R05 Cough: Secondary | ICD-10-CM

## 2012-11-19 ENCOUNTER — Other Ambulatory Visit: Payer: Medicare Other

## 2012-11-22 ENCOUNTER — Other Ambulatory Visit: Payer: Medicare Other

## 2012-11-29 ENCOUNTER — Ambulatory Visit
Admission: RE | Admit: 2012-11-29 | Discharge: 2012-11-29 | Disposition: A | Discharge status: Home / Self Care | Payer: Medicare Other | Source: Ambulatory Visit | Attending: Family Medicine | Admitting: Family Medicine

## 2012-11-29 ENCOUNTER — Other Ambulatory Visit: Payer: Medicare Other

## 2012-11-29 DIAGNOSIS — R05 Cough: Secondary | ICD-10-CM

## 2012-11-29 DIAGNOSIS — R059 Cough, unspecified: Secondary | ICD-10-CM

## 2012-12-20 ENCOUNTER — Other Ambulatory Visit (HOSPITAL_COMMUNITY): Payer: Self-pay | Admitting: Family Medicine

## 2012-12-20 DIAGNOSIS — R053 Chronic cough: Secondary | ICD-10-CM

## 2012-12-20 DIAGNOSIS — R05 Cough: Secondary | ICD-10-CM

## 2012-12-20 DIAGNOSIS — R1310 Dysphagia, unspecified: Secondary | ICD-10-CM

## 2012-12-27 ENCOUNTER — Inpatient Hospital Stay (HOSPITAL_COMMUNITY): Admission: RE | Admit: 2012-12-27 | Payer: Medicare Other | Source: Ambulatory Visit

## 2012-12-29 ENCOUNTER — Other Ambulatory Visit (HOSPITAL_COMMUNITY): Payer: Self-pay | Admitting: Family Medicine

## 2012-12-29 DIAGNOSIS — R1310 Dysphagia, unspecified: Secondary | ICD-10-CM

## 2013-01-03 ENCOUNTER — Other Ambulatory Visit (HOSPITAL_COMMUNITY): Payer: Medicare Other

## 2013-01-03 ENCOUNTER — Ambulatory Visit (HOSPITAL_COMMUNITY)
Admission: RE | Admit: 2013-01-03 | Discharge: 2013-01-03 | Disposition: A | Discharge status: Home / Self Care | Payer: Medicare Other | Source: Ambulatory Visit | Attending: Family Medicine | Admitting: Family Medicine

## 2013-01-03 DIAGNOSIS — K449 Diaphragmatic hernia without obstruction or gangrene: Secondary | ICD-10-CM | POA: Insufficient documentation

## 2013-01-03 DIAGNOSIS — R1310 Dysphagia, unspecified: Secondary | ICD-10-CM | POA: Insufficient documentation

## 2013-08-18 ENCOUNTER — Other Ambulatory Visit: Payer: Self-pay

## 2013-08-18 DIAGNOSIS — Z1231 Encounter for screening mammogram for malignant neoplasm of breast: Secondary | ICD-10-CM

## 2013-09-08 ENCOUNTER — Ambulatory Visit: Payer: Medicare Other

## 2013-09-28 ENCOUNTER — Ambulatory Visit: Payer: Medicare Other

## 2013-09-29 ENCOUNTER — Ambulatory Visit: Payer: Medicare Other

## 2013-10-18 ENCOUNTER — Ambulatory Visit (HOSPITAL_COMMUNITY): Payer: Medicare Other

## 2013-11-02 ENCOUNTER — Ambulatory Visit (HOSPITAL_COMMUNITY): Admission: RE | Admit: 2013-11-02 | Payer: Medicare Other | Source: Ambulatory Visit

## 2013-11-03 ENCOUNTER — Ambulatory Visit (HOSPITAL_COMMUNITY): Payer: Medicare Other

## 2013-11-16 ENCOUNTER — Other Ambulatory Visit (HOSPITAL_COMMUNITY): Payer: Self-pay | Admitting: Internal Medicine

## 2013-11-16 ENCOUNTER — Ambulatory Visit (HOSPITAL_COMMUNITY)
Admission: RE | Admit: 2013-11-16 | Discharge: 2013-11-16 | Disposition: A | Discharge status: Home / Self Care | Payer: Medicare Other | Source: Ambulatory Visit | Attending: Family Medicine | Admitting: Family Medicine

## 2013-11-16 DIAGNOSIS — Z1231 Encounter for screening mammogram for malignant neoplasm of breast: Secondary | ICD-10-CM

## 2014-09-29 ENCOUNTER — Encounter (HOSPITAL_COMMUNITY): Payer: Self-pay | Admitting: Emergency Medicine

## 2014-09-29 ENCOUNTER — Emergency Department (HOSPITAL_COMMUNITY)
Admission: EM | Admit: 2014-09-29 | Discharge: 2014-09-29 | Disposition: A | Discharge status: Home / Self Care | Payer: Medicare HMO | Attending: Emergency Medicine | Admitting: Emergency Medicine

## 2014-09-29 DIAGNOSIS — Z7982 Long term (current) use of aspirin: Secondary | ICD-10-CM | POA: Insufficient documentation

## 2014-09-29 DIAGNOSIS — Z79899 Other long term (current) drug therapy: Secondary | ICD-10-CM | POA: Insufficient documentation

## 2014-09-29 DIAGNOSIS — R6883 Chills (without fever): Secondary | ICD-10-CM | POA: Diagnosis not present

## 2014-09-29 DIAGNOSIS — R21 Rash and other nonspecific skin eruption: Secondary | ICD-10-CM | POA: Insufficient documentation

## 2014-09-29 DIAGNOSIS — F419 Anxiety disorder, unspecified: Secondary | ICD-10-CM | POA: Insufficient documentation

## 2014-09-29 DIAGNOSIS — I10 Essential (primary) hypertension: Secondary | ICD-10-CM | POA: Diagnosis not present

## 2014-09-29 DIAGNOSIS — I2510 Atherosclerotic heart disease of native coronary artery without angina pectoris: Secondary | ICD-10-CM | POA: Insufficient documentation

## 2014-09-29 HISTORY — DX: Anxiety disorder, unspecified: F41.9

## 2014-09-29 MED ORDER — HYDROCORTISONE 2.5 % EX LOTN: TOPICAL_LOTION | Freq: Two times a day (BID) | CUTANEOUS | Status: DC

## 2014-09-29 NOTE — ED Notes (Signed)
Pt reports she's had an itchy rash for 2 weeks.

## 2014-09-29 NOTE — ED Provider Notes (Signed)
CSN: [id number]     Arrival date & time 09/29/14  1232 History  This chart was scribed for non-physician practitioner, Hyman Bible, PA-C, working with Janice Norrie, MD by Ladene Artist, ED Scribe. This patient was seen in room TR10C/TR10C and the patient's care was started at 1:16 PM.   Chief Complaint  Patient presents with  . Rash   The history is provided by the patient. No language interpreter was used.   HPI Comments: Catherine [name] is a 58 y.o. female who presents to the Emergency Department complaining of rash to bilateral arms, legs and back first noted 2 weeks ago. She reports associated itching and soreness to the rash. Pt reports associated chills. She denies fever, nausea, vomiting, SOB, tongue swelling. Pt has not tried any medications for rash.  She denies new soaps, detergents, or lotions.  No SOB.  Past Medical History  Diagnosis Date  . Hypertension   . CAD (coronary artery disease)   . Anxiety    Past Surgical History  Procedure Laterality Date  . Tubal ligation     No family history on file. History  Substance Use Topics  . Smoking status: Never Smoker   . Smokeless tobacco: Not on file  . Alcohol Use: No   OB History   Grav Para Term Preterm Abortions TAB SAB Ect Mult Living                 Review of Systems  Constitutional: Positive for chills. Negative for fever.  Respiratory: Negative for shortness of breath.   Gastrointestinal: Negative for nausea and vomiting.  Skin: Positive for rash.  All other systems reviewed and are negative.  Allergies  Ciprofloxacin  Home Medications   Prior to Admission medications   Medication Sig Start Date End Date Taking? Authorizing Provider  amLODipine (NORVASC) 10 MG tablet Take 10 mg by mouth every morning.    Historical Provider, MD  aspirin 81 MG chewable tablet Chew 81 mg by mouth daily.    Historical Provider, MD  carvedilol (COREG) 6.25 MG tablet Take 6.25 mg by mouth 2 (two) times daily with a meal.     Historical Provider, MD  meclizine (ANTIVERT) 25 MG tablet Take 25 mg by mouth daily as needed. For dizziness    Historical Provider, MD  neomycin-bacitracin-polymyxin (NEOSPORIN) OINT Apply 1 application topically daily. 07/29/12   Garald Balding, NP  omeprazole (PRILOSEC) 20 MG capsule Take 20 mg by mouth daily before breakfast.    Historical Provider, MD  rosuvastatin (CRESTOR) 10 MG tablet Take 10 mg by mouth at bedtime.    Historical Provider, MD  venlafaxine XR (EFFEXOR-XR) 75 MG 24 hr capsule Take 75 mg by mouth every morning.    Historical Provider, MD   Triage Vitals: BP 139/86  Pulse 74  Temp(Src) 98.5 F (36.9 C) (Oral)  Resp 15  Ht 5\' 1"  (1.549 m)  Wt 195 lb 3 oz (88.536 kg)  BMI 36.90 kg/m2  SpO2 95% Physical Exam  Nursing note and vitals reviewed. Constitutional: She is oriented to person, place, and time. She appears well-developed and well-nourished. No distress.  HENT:  Head: Normocephalic and atraumatic.  Mouth/Throat: Oropharynx is clear and moist.  Eyes: Conjunctivae and EOM are normal.  Neck: Neck supple. No tracheal deviation present.  Cardiovascular: Normal rate and regular rhythm.   Pulmonary/Chest: Effort normal and breath sounds normal. No respiratory distress.  Musculoskeletal: Normal range of motion.  Neurological: She is alert and oriented to person, place,  and time.  Skin: Skin is warm and dry. Rash noted. No petechiae and no purpura noted. Rash is papular.  Several small erythematous papules diffusely on both arms, both lower legs, lower back  No swelling of lips, tongue or throat  No rash in web spaces of fingers  Psychiatric: She has a normal mood and affect. Her behavior is normal.   ED Course  Procedures (including critical care time) DIAGNOSTIC STUDIES: Oxygen Saturation is 95% on RA, adequate by my interpretation.    COORDINATION OF CARE: 1:22 PM-Discussed treatment plan which includes Benadryl and cream with pt at bedside and pt agreed to  plan.   Labs Review Labs Reviewed - No data to display  Imaging Review No results found.   EKG Interpretation None      MDM   Final diagnoses:  None   Patient presenting with a diffuse pruritic rash on her arms, legs, and back.   Patient afebrile.  No swelling of the lips, tongue, or throat.  Patient instructed to take Benadryl for itching and apply Hydrocortisone to the rash.  Patient stable for discharge.  Return precautions given.  I personally performed the services described in this documentation, which was scribed in my presence. The recorded information has been reviewed and is accurate.    Hyman Bible, PA-C 09/29/14 1424

## 2014-09-29 NOTE — Discharge Instructions (Signed)
Take Benadryl once daily for your rash.  Apply Hydrocortisone to the rash.

## 2014-09-29 NOTE — ED Provider Notes (Signed)
Medical screening examination/treatment/procedure(s) were performed by non-physician practitioner and as supervising physician I was immediately available for consultation/collaboration.   EKG Interpretation None      Rolland Porter, MD, Abram Sander   Janice Norrie, MD 09/29/14 [id number]

## 2014-11-06 ENCOUNTER — Emergency Department (HOSPITAL_COMMUNITY)
Admission: EM | Admit: 2014-11-06 | Discharge: 2014-11-06 | Disposition: A | Discharge status: Home / Self Care | Payer: Medicare Other | Attending: Emergency Medicine | Admitting: Emergency Medicine

## 2014-11-06 ENCOUNTER — Encounter (HOSPITAL_COMMUNITY): Payer: Self-pay | Admitting: *Deleted

## 2014-11-06 DIAGNOSIS — I10 Essential (primary) hypertension: Secondary | ICD-10-CM | POA: Insufficient documentation

## 2014-11-06 DIAGNOSIS — Z7952 Long term (current) use of systemic steroids: Secondary | ICD-10-CM | POA: Diagnosis not present

## 2014-11-06 DIAGNOSIS — Z7982 Long term (current) use of aspirin: Secondary | ICD-10-CM | POA: Diagnosis not present

## 2014-11-06 DIAGNOSIS — Z79899 Other long term (current) drug therapy: Secondary | ICD-10-CM | POA: Insufficient documentation

## 2014-11-06 DIAGNOSIS — F419 Anxiety disorder, unspecified: Secondary | ICD-10-CM | POA: Insufficient documentation

## 2014-11-06 DIAGNOSIS — Z792 Long term (current) use of antibiotics: Secondary | ICD-10-CM | POA: Diagnosis not present

## 2014-11-06 DIAGNOSIS — I2510 Atherosclerotic heart disease of native coronary artery without angina pectoris: Secondary | ICD-10-CM | POA: Diagnosis not present

## 2014-11-06 DIAGNOSIS — R0602 Shortness of breath: Secondary | ICD-10-CM | POA: Insufficient documentation

## 2014-11-06 DIAGNOSIS — R2243 Localized swelling, mass and lump, lower limb, bilateral: Secondary | ICD-10-CM | POA: Insufficient documentation

## 2014-11-06 DIAGNOSIS — M7989 Other specified soft tissue disorders: Secondary | ICD-10-CM

## 2014-11-06 LAB — BASIC METABOLIC PANEL
Anion gap: 12 (ref 5–15)
BUN: 7 mg/dL (ref 6–23)
CO2: 26 mEq/L (ref 19–32)
Calcium: 9.7 mg/dL (ref 8.4–10.5)
Chloride: 103 mEq/L (ref 96–112)
Creatinine, Ser: 0.86 mg/dL (ref 0.50–1.10)
GFR calc Af Amer: 83 mL/min — ABNORMAL LOW (ref >90)
GFR calc non Af Amer: 72 mL/min — ABNORMAL LOW (ref >90)
Glucose, Bld: 106 mg/dL — ABNORMAL HIGH (ref 70–99)
Potassium: 3.5 mEq/L — ABNORMAL LOW (ref 3.7–5.3)
Sodium: 141 mEq/L (ref 137–147)

## 2014-11-06 LAB — CBC
HCT: 36.2 % (ref 36.0–46.0)
Hemoglobin: 11.6 g/dL — ABNORMAL LOW (ref 12.0–15.0)
MCH: 26.1 pg (ref 26.0–34.0)
MCHC: 32 g/dL (ref 30.0–36.0)
MCV: 81.5 fL (ref 78.0–100.0)
Platelets: 237 10*3/uL (ref 150–400)
RBC: 4.44 MIL/uL (ref 3.87–5.11)
RDW: 14.1 % (ref 11.5–15.5)
WBC: 3.2 10*3/uL — ABNORMAL LOW (ref 4.0–10.5)

## 2014-11-06 LAB — PRO B NATRIURETIC PEPTIDE: Pro B Natriuretic peptide (BNP): <5 pg/mL (ref 0–125)

## 2014-11-06 LAB — I-STAT TROPONIN, ED: Troponin i, poc: 0.01 ng/mL (ref 0.00–0.08)

## 2014-11-06 NOTE — ED Provider Notes (Signed)
CSN: [id number]     Arrival date & time 11/06/14  1136 History   First MD Initiated Contact with Patient 11/06/14 1516     Chief Complaint  Patient presents with  . Foot Swelling     (Consider location/radiation/quality/duration/timing/severity/associated sxs/prior Treatment) HPI Comments: Patient with past medical history of hypertension, CAD, and anxiety presents to the emergency department with bilateral foot swelling 1 week. Patient reports associated shortness of breath with exertion. She states that this is worsening. She denies any associated chest pain. She denies fevers, chills, cough, nausea, vomiting, diarrhea, or constipation. She has not experienced these symptoms before. She states that she is "scared." she has not tried taking anything to alleviate her symptoms. She denies any mechanism of injury or trauma.  Denies recent long travel.  The history is provided by the patient. No language interpreter was used.    Past Medical History  Diagnosis Date  . Hypertension   . CAD (coronary artery disease)   . Anxiety    Past Surgical History  Procedure Laterality Date  . Tubal ligation     History reviewed. No pertinent family history. History  Substance Use Topics  . Smoking status: Never Smoker   . Smokeless tobacco: Not on file  . Alcohol Use: No   OB History    No data available     Review of Systems  Constitutional: Negative for fever and chills.  Respiratory: Positive for shortness of breath.   Cardiovascular: Positive for leg swelling. Negative for chest pain.  Gastrointestinal: Negative for nausea, vomiting, diarrhea and constipation.  Genitourinary: Negative for dysuria.  All other systems reviewed and are negative.     Allergies  Ciprofloxacin  Home Medications   Prior to Admission medications   Medication Sig Start Date End Date Taking? Authorizing Provider  amLODipine (NORVASC) 10 MG tablet Take 10 mg by mouth every morning.    Historical  Provider, MD  aspirin 81 MG chewable tablet Chew 81 mg by mouth daily.    Historical Provider, MD  carvedilol (COREG) 6.25 MG tablet Take 6.25 mg by mouth 2 (two) times daily with a meal.    Historical Provider, MD  hydrocortisone 2.5 % lotion Apply topically 2 (two) times daily. 09/29/14   Heather Laisure, PA-C  meclizine (ANTIVERT) 25 MG tablet Take 25 mg by mouth daily as needed. For dizziness    Historical Provider, MD  neomycin-bacitracin-polymyxin (NEOSPORIN) OINT Apply 1 application topically daily. 07/29/12   Garald Balding, NP  omeprazole (PRILOSEC) 20 MG capsule Take 20 mg by mouth daily before breakfast.    Historical Provider, MD  rosuvastatin (CRESTOR) 10 MG tablet Take 10 mg by mouth at bedtime.    Historical Provider, MD  venlafaxine XR (EFFEXOR-XR) 75 MG 24 hr capsule Take 75 mg by mouth every morning.    Historical Provider, MD   BP 142/90 mmHg  Pulse 74  Temp(Src) 98.4 F (36.9 C) (Oral)  Resp 16  Ht 5\' 1"  (1.549 m)  Wt 195 lb 3 oz (88.536 kg)  BMI 36.90 kg/m2  SpO2 96% Physical Exam  Constitutional: She is oriented to person, place, and time. She appears well-developed and well-nourished.  HENT:  Head: Normocephalic and atraumatic.  Eyes: Conjunctivae and EOM are normal. Pupils are equal, round, and reactive to light.  Neck: Normal range of motion. Neck supple.  Cardiovascular: Normal rate and regular rhythm.  Exam reveals no gallop and no friction rub.   No murmur heard. Distal pulses intact  Pulmonary/Chest:  Effort normal and breath sounds normal. No respiratory distress. She has no wheezes. She has no rales. She exhibits no tenderness.  Clear to auscultation bilaterally  Abdominal: Soft. Bowel sounds are normal. She exhibits no distension and no mass. There is no tenderness. There is no rebound and no guarding.  Musculoskeletal: Normal range of motion. She exhibits edema. She exhibits no tenderness.  Moderate peripheral edema in lower extremities  Neurological: She  is alert and oriented to person, place, and time.  Distal sensation intact  Skin: Skin is warm and dry.  Psychiatric: She has a normal mood and affect. Her behavior is normal. Judgment and thought content normal.  Nursing note and vitals reviewed.   ED Course  Procedures (including critical care time) Results for orders placed or performed during the hospital encounter of 11/06/14  BNP (order ONLY if patient complains of dyspnea/SOB AND you have documented it for THIS visit)  Result Value Ref Range   Pro B Natriuretic peptide (BNP) <5.0 0 - 125 pg/mL  CBC  Result Value Ref Range   WBC 3.2 (L) 4.0 - 10.5 K/uL   RBC 4.44 3.87 - 5.11 MIL/uL   Hemoglobin 11.6 (L) 12.0 - 15.0 g/dL   HCT 36.2 36.0 - 46.0 %   MCV 81.5 78.0 - 100.0 fL   MCH 26.1 26.0 - 34.0 pg   MCHC 32.0 30.0 - 36.0 g/dL   RDW 14.1 11.5 - 15.5 %   Platelets 237 150 - 400 K/uL  Basic metabolic panel  Result Value Ref Range   Sodium 141 137 - 147 mEq/L   Potassium 3.5 (L) 3.7 - 5.3 mEq/L   Chloride 103 96 - 112 mEq/L   CO2 26 19 - 32 mEq/L   Glucose, Bld 106 (H) 70 - 99 mg/dL   BUN 7 6 - 23 mg/dL   Creatinine, Ser 0.86 0.50 - 1.10 mg/dL   Calcium 9.7 8.4 - 10.5 mg/dL   GFR calc non Af Amer 72 (L) >90 mL/min   GFR calc Af Amer 83 (L) >90 mL/min   Anion gap 12 5 - 15  I-stat troponin, ED (not at [facility])  Result Value Ref Range   Troponin i, poc 0.01 0.00 - 0.08 ng/mL   Comment 3           No results found.   Imaging Review No results found.   EKG Interpretation None      MDM   Final diagnoses:  Leg swelling    Patient with peripheral edema, and exertional shortness of breath. Labs are reassuring. BNP is not elevated.  VSS. Plan for discharge to home with close follow-up. Will recommend compression stockings and elevation.  Patient seen by and discussed with Dr. Reather Converse, who agrees with plan for discharge.  Labs are reassuring.  Patient is well appearing and non-toxic.  DC to home with PCP  follow-up.    Montine Circle, PA-C 11/06/14 1553  Mariea Clonts, MD 11/07/14 [id number]

## 2014-11-06 NOTE — ED Notes (Signed)
Pt reports bilateral foot swelling for over a week with occ sob. Airway intact at triage.

## 2014-11-06 NOTE — Discharge Instructions (Signed)
Peripheral Edema °You have swelling in your legs (peripheral edema). This swelling is due to excess accumulation of salt and water in your body. Edema may be a sign of heart, kidney or liver disease, or a side effect of a medication. It may also be due to problems in the leg veins. Elevating your legs and using special support stockings may be very helpful, if the cause of the swelling is due to poor venous circulation. Avoid long periods of standing, whatever the cause. °Treatment of edema depends on identifying the cause. Chips, pretzels, pickles and other salty foods should be avoided. Restricting salt in your diet is almost always needed. Water pills (diuretics) are often used to remove the excess salt and water from your body via urine. These medicines prevent the kidney from reabsorbing sodium. This increases urine flow. °Diuretic treatment may also result in lowering of potassium levels in your body. Potassium supplements may be needed if you have to use diuretics daily. Daily weights can help you keep track of your progress in clearing your edema. You should call your caregiver for follow up care as recommended. °SEEK IMMEDIATE MEDICAL CARE IF:  °· You have increased swelling, pain, redness, or heat in your legs. °· You develop shortness of breath, especially when lying down. °· You develop chest or abdominal pain, weakness, or fainting. °· You have a fever. °Document Released: 01/15/2005 Document Revised: 03/01/2012 Document Reviewed: 12/26/2009 °ExitCare® Patient Information ©2015 [employer]. This information is not intended to replace advice given to you by your health care provider. Make sure you discuss any questions you have with your health care provider. ° °

## 2014-11-06 NOTE — ED Notes (Signed)
MD Zavitz at the bedside.

## 2015-02-28 DIAGNOSIS — E784 Other hyperlipidemia: Secondary | ICD-10-CM | POA: Diagnosis not present

## 2015-02-28 DIAGNOSIS — Z131 Encounter for screening for diabetes mellitus: Secondary | ICD-10-CM | POA: Diagnosis not present

## 2015-02-28 DIAGNOSIS — J302 Other seasonal allergic rhinitis: Secondary | ICD-10-CM | POA: Diagnosis not present

## 2015-02-28 DIAGNOSIS — I10 Essential (primary) hypertension: Secondary | ICD-10-CM | POA: Diagnosis not present

## 2015-02-28 DIAGNOSIS — G43909 Migraine, unspecified, not intractable, without status migrainosus: Secondary | ICD-10-CM | POA: Diagnosis not present

## 2015-02-28 DIAGNOSIS — Z23 Encounter for immunization: Secondary | ICD-10-CM | POA: Diagnosis not present

## 2015-03-14 ENCOUNTER — Other Ambulatory Visit (HOSPITAL_COMMUNITY): Payer: Self-pay | Admitting: Internal Medicine

## 2015-03-14 DIAGNOSIS — Z1231 Encounter for screening mammogram for malignant neoplasm of breast: Secondary | ICD-10-CM

## 2015-03-21 ENCOUNTER — Ambulatory Visit (HOSPITAL_COMMUNITY)
Admission: RE | Admit: 2015-03-21 | Discharge: 2015-03-21 | Disposition: A | Discharge status: Home / Self Care | Payer: Medicare Other | Source: Ambulatory Visit | Attending: Internal Medicine | Admitting: Internal Medicine

## 2015-03-21 DIAGNOSIS — Z1231 Encounter for screening mammogram for malignant neoplasm of breast: Secondary | ICD-10-CM | POA: Diagnosis not present

## 2015-05-04 ENCOUNTER — Ambulatory Visit (INDEPENDENT_AMBULATORY_CARE_PROVIDER_SITE_OTHER): Payer: Medicare Other | Admitting: Family

## 2015-05-04 ENCOUNTER — Encounter: Payer: Self-pay | Admitting: Family

## 2015-05-04 VITALS — BP 132/80 | HR 64 | Temp 98.0°F | Resp 18 | Ht 61.0 in | Wt 197.8 lb

## 2015-05-04 DIAGNOSIS — F329 Major depressive disorder, single episode, unspecified: Secondary | ICD-10-CM | POA: Insufficient documentation

## 2015-05-04 DIAGNOSIS — F32A Depression, unspecified: Secondary | ICD-10-CM | POA: Insufficient documentation

## 2015-05-04 DIAGNOSIS — F418 Other specified anxiety disorders: Secondary | ICD-10-CM | POA: Diagnosis not present

## 2015-05-04 DIAGNOSIS — F419 Anxiety disorder, unspecified: Secondary | ICD-10-CM

## 2015-05-04 MED ORDER — CLONAZEPAM 1 MG PO TABS: 1.0000 mg | ORAL_TABLET | Freq: Two times a day (BID) | ORAL | Status: DC

## 2015-05-04 MED ORDER — VENLAFAXINE HCL ER 37.5 MG PO CP24: 37.5000 mg | ORAL_CAPSULE | Freq: Every day | ORAL | Status: DC

## 2015-05-04 MED ORDER — BUPROPION HCL ER (XL) 150 MG PO TB24: ORAL_TABLET | ORAL | Status: DC

## 2015-05-04 NOTE — Patient Instructions (Addendum)
Thank you for choosing [facility].  Summary/Instructions:   Please increase your Clonazepam to 1 mg twice daily as needed for anxiety.   Start the [employer] daily as instructed.   Continue taking the Effexor until the lower dose arrives and then decrease Effexor to 37.5 mg for 1 week. Once completed STOP taking the Effexor.  Your prescription(s) have been submitted to your pharmacy or been printed and provided for you. Please take as directed and contact our office if you believe you are having problem(s) with the medication(s) or have any questions.  If your symptoms worsen or fail to improve, please contact our office for further instruction, or in case of emergency go directly to the emergency room at the closest medical facility.    Depression Depression refers to feeling sad, low, down in the dumps, blue, gloomy, or empty. In general, there are two kinds of depression:  Normal sadness or normal grief. This kind of depression is one that we all feel from time to time after upsetting life experiences, such as the loss of a job or the ending of a relationship. This kind of depression is considered normal, is short lived, and resolves within a few days to 2 weeks. Depression experienced after the loss of a loved one (bereavement) often lasts longer than 2 weeks but normally gets better with time.  Clinical depression. This kind of depression lasts longer than normal sadness or normal grief or interferes with your ability to function at home, at work, and in school. It also interferes with your personal relationships. It affects almost every aspect of your life. Clinical depression is an illness. Symptoms of depression can also be caused by conditions other than those mentioned above, such as:  Physical illness. Some physical illnesses, including underactive thyroid gland (hypothyroidism), severe anemia, specific types of cancer, diabetes, uncontrolled seizures, heart and lung  problems, strokes, and chronic pain are commonly associated with symptoms of depression.  Side effects of some prescription medicine. In some people, certain types of medicine can cause symptoms of depression.  Substance abuse. Abuse of alcohol and illicit drugs can cause symptoms of depression. SYMPTOMS Symptoms of normal sadness and normal grief include the following:  Feeling sad or crying for short periods of time.  Not caring about anything (apathy).  Difficulty sleeping or sleeping too much.  No longer able to enjoy the things you used to enjoy.  Desire to be by oneself all the time (social isolation).  Lack of energy or motivation.  Difficulty concentrating or remembering.  Change in appetite or weight.  Restlessness or agitation. Symptoms of clinical depression include the same symptoms of normal sadness or normal grief and also the following symptoms:  Feeling sad or crying all the time.  Feelings of guilt or worthlessness.  Feelings of hopelessness or helplessness.  Thoughts of suicide or the desire to harm yourself (suicidal ideation).  Loss of touch with reality (psychotic symptoms). Seeing or hearing things that are not real (hallucinations) or having false beliefs about your life or the people around you (delusions and paranoia). DIAGNOSIS  The diagnosis of clinical depression is usually based on how bad the symptoms are and how long they have lasted. Your health care provider will also ask you questions about your medical history and substance use to find out if physical illness, use of prescription medicine, or substance abuse is causing your depression. Your health care provider may also order blood tests. TREATMENT  Often, normal sadness and normal grief do  not require treatment. However, sometimes antidepressant medicine is given for bereavement to ease the depressive symptoms until they resolve. The treatment for clinical depression depends on how bad the  symptoms are but often includes antidepressant medicine, counseling with a mental health professional, or both. Your health care provider will help to determine what treatment is best for you. Depression caused by physical illness usually goes away with appropriate medical treatment of the illness. If prescription medicine is causing depression, talk with your health care provider about stopping the medicine, decreasing the dose, or changing to another medicine. Depression caused by the abuse of alcohol or illicit drugs goes away when you stop using these substances. Some adults need professional help in order to stop drinking or using drugs. SEEK IMMEDIATE MEDICAL CARE IF:  You have thoughts about hurting yourself or others.  You lose touch with reality (have psychotic symptoms).  You are taking medicine for depression and have a serious side effect. FOR MORE INFORMATION  National Alliance on Mental Illness: www.nami.[employer] of Mental Health: https://carter.com/ Document Released: 12/05/2000 Document Revised: 04/24/2014 Document Reviewed: 03/08/2012 [facility] Patient Information 2015 [place], [place]. This information is not intended to replace advice given to you by your health care provider. Make sure you discuss any questions you have with your health care provider.  Generalized Anxiety Disorder Generalized anxiety disorder (GAD) is a mental disorder. It interferes with life functions, including relationships, work, and school. GAD is different from normal anxiety, which everyone experiences at some point in their lives in response to specific life events and activities. Normal anxiety actually helps [place] prepare for and get through these life events and activities. Normal anxiety goes away after the event or activity is over.  GAD causes anxiety that is not necessarily related to specific events or activities. It also causes excess anxiety in proportion to specific events or  activities. The anxiety associated with GAD is also difficult to control. GAD can vary from mild to severe. People with severe GAD can have intense waves of anxiety with physical symptoms (panic attacks).  SYMPTOMS The anxiety and worry associated with GAD are difficult to control. This anxiety and worry are related to many life events and activities and also occur more days than not for 6 months or longer. People with GAD also have three or more of the following symptoms (one or more in children):  Restlessness.   Fatigue.  Difficulty concentrating.   Irritability.  Muscle tension.  Difficulty sleeping or unsatisfying sleep. DIAGNOSIS GAD is diagnosed through an assessment by your health care provider. Your health care provider will ask you questions aboutyour mood,physical symptoms, and events in your life. Your health care provider may ask you about your medical history and use of alcohol or drugs, including prescription medicines. Your health care provider may also do a physical exam and blood tests. Certain medical conditions and the use of certain substances can cause symptoms similar to those associated with GAD. Your health care provider may refer you to a mental health specialist for further evaluation. TREATMENT The following therapies are usually used to treat GAD:   Medication. Antidepressant medication usually is prescribed for long-term daily control. Antianxiety medicines may be added in severe cases, especially when panic attacks occur.   Talk therapy (psychotherapy). Certain types of talk therapy can be helpful in treating GAD by providing support, education, and guidance. A form of talk therapy called cognitive behavioral therapy can teach you healthy ways to think about and react  to daily life events and activities.  Stress managementtechniques. These include yoga, meditation, and exercise and can be very helpful when they are practiced regularly. A mental health  specialist can help determine which treatment is best for you. Some people see improvement with one therapy. However, other people require a combination of therapies. Document Released: 04/04/2013 Document Revised: 04/24/2014 Document Reviewed: 04/04/2013 [facility] Patient Information 2015 [place], [place]. This information is not intended to replace advice given to you by your health care provider. Make sure you discuss any questions you have with your health care provider.

## 2015-05-04 NOTE — Assessment & Plan Note (Signed)
Experiencing increased frequency of anxiety and depression with current regimen. Taper down and discontinue Effexor. Start Wellbutrin. Increase clonazepam to 1 mg twice daily as needed for anxiety. Denies suicidal ideations. Follow up in 1 month.

## 2015-05-04 NOTE — Progress Notes (Signed)
Subjective:    Patient ID: Catherine [name], female    DOB: 1956-12-20, 59 y.o.   MRN: [id number]  Chief Complaint  Patient presents with  . Establish Care    would like to see about increasing the quanity of anxiety medication to 2 a day, says 90 tabs doesn't last 3 months, says effexor doesn't help,    HPI:  Catherine [name] is a 59 y.o. female with a PMH of hypertension, hyperal lipidemia, GERD, depression, anxiety, and anemia who presents today for an office visit to establish care.  1.) Anxiety - Previously diagnosed with anxiety. Indicates that she continues to experience the associated symptoms of anxiety with the current regimen of klonopin and venlafaxine that has been exacerbated during the last couple of months. The severity of the anxiety is enough to effect her sleep with an average about 1-4 hours. Timing of the anxiety is all throughout the day. Other modifying factors includes walking and getting out of the house. Currently takes care of her mother who is total care on a daily basis. Indicates that the Effexor doesn't keep her feeling normal with the clonazep    Allergies  Allergen Reactions  . Ciprofloxacin Itching    Current Outpatient Prescriptions on File Prior to Visit  Medication Sig Dispense Refill  . albuterol (PROVENTIL HFA;VENTOLIN HFA) 108 (90 BASE) MCG/ACT inhaler Inhale 1-2 puffs into the lungs every 6 (six) hours as needed for wheezing or shortness of breath.    Marland Kitchen amLODipine (NORVASC) 10 MG tablet Take 10 mg by mouth every morning.    Marland Kitchen aspirin 81 MG chewable tablet Chew 81 mg by mouth daily.    . carvedilol (COREG) 6.25 MG tablet Take 6.25 mg by mouth 2 (two) times daily with a meal.    . Cholecalciferol (VITAMIN D PO) Take 1 tablet by mouth daily.    . hydrocortisone 2.5 % lotion Apply topically 2 (two) times daily. 59 mL 0  . omeprazole (PRILOSEC) 20 MG capsule Take 20 mg by mouth daily before breakfast.    . rosuvastatin (CRESTOR) 10 MG tablet Take 10 mg  by mouth at bedtime.     No current facility-administered medications on file prior to visit.    Past Medical History  Diagnosis Date  . Hypertension   . CAD (coronary artery disease)   . Anxiety     Past Surgical History  Procedure Laterality Date  . Tubal ligation      Family History  Problem Relation Age of Onset  . Kidney disease Mother   . Seizures Mother   . Heart failure Father     History   Social History  . Marital Status: Single    Spouse Name: N/A  . Number of Children: 2  . Years of Education: 9   Occupational History  . Disability     Heart Disease   Social History Main Topics  . Smoking status: Never Smoker   . Smokeless tobacco: Never Used  . Alcohol Use: No  . Drug Use: No  . Sexual Activity: Yes    Birth Control/ Protection: Surgical   Other Topics Concern  . Not on file   Social History Narrative   Tends to her mother.   Denies religous beliefs effecting health care.     Review of Systems  Endocrine: Negative for cold intolerance and heat intolerance.  Psychiatric/Behavioral: Positive for dysphoric mood. The patient is nervous/anxious.       Objective:    BP 132/80 mmHg  Pulse 64  Temp(Src) 98 F (36.7 C) (Oral)  Resp 18  Ht 5\' 1"  (1.549 m)  Wt 197 lb 12.8 oz (89.721 kg)  BMI 37.39 kg/m2  SpO2 96% Nursing note and vital signs reviewed.  Physical Exam  Constitutional: She is oriented to person, place, and time. She appears well-developed and well-nourished. No distress.  Cardiovascular: Normal rate, regular rhythm, normal heart sounds and intact distal pulses.   Pulmonary/Chest: Effort normal and breath sounds normal.  Neurological: She is alert and oriented to person, place, and time.  Skin: Skin is warm and dry.  Psychiatric: Her behavior is normal. Judgment and thought content normal. Her mood appears anxious. She exhibits a depressed mood.       Assessment & Plan:

## 2015-05-04 NOTE — Progress Notes (Signed)
Pre visit review using our clinic review tool, if applicable. No additional management support is needed unless otherwise documented below in the visit note. 

## 2015-05-08 ENCOUNTER — Telehealth: Payer: Self-pay | Admitting: Family

## 2015-05-08 NOTE — Telephone Encounter (Signed)
Patient called and [facility] told her that we need to fax prescriptions over with a cover letter so they know that it came from [place]. clonazePAM (KLONOPIN) 1 MG tablet [[id number], buPROPion (WELLBUTRIN XL) 150 MG 24 hr tablet [[id number]] , and venlafaxine XR (EFFEXOR-XR) 37.5 MG 24 hr capsule [[id number]]

## 2015-05-09 MED ORDER — CLONAZEPAM 1 MG PO TABS: 1.0000 mg | ORAL_TABLET | Freq: Two times a day (BID) | ORAL | Status: DC

## 2015-05-09 MED ORDER — VENLAFAXINE HCL ER 37.5 MG PO CP24: 37.5000 mg | ORAL_CAPSULE | Freq: Every day | ORAL | Status: DC

## 2015-05-09 MED ORDER — BUPROPION HCL ER (XL) 150 MG PO TB24: ORAL_TABLET | ORAL | Status: DC

## 2015-05-09 NOTE — Telephone Encounter (Signed)
LVM making pt aware

## 2015-05-09 NOTE — Telephone Encounter (Signed)
Fax sent with cover letter.

## 2015-05-09 NOTE — Telephone Encounter (Signed)
Pt called back in about this.    Best number to [phone]

## 2015-05-10 MED ORDER — VENLAFAXINE HCL ER 37.5 MG PO CP24: 37.5000 mg | ORAL_CAPSULE | Freq: Every day | ORAL | Status: DC

## 2015-05-10 MED ORDER — BUPROPION HCL ER (XL) 150 MG PO TB24: ORAL_TABLET | ORAL | Status: DC

## 2015-05-10 MED ORDER — CLONAZEPAM 1 MG PO TABS: 1.0000 mg | ORAL_TABLET | Freq: Two times a day (BID) | ORAL | Status: DC

## 2015-05-10 NOTE — Telephone Encounter (Signed)
Patient called stating [facility] has not received anything.

## 2015-05-10 NOTE — Addendum Note (Signed)
Addended by: Mauricio Po D on: 05/10/2015 05:41 PM   Modules accepted: Orders

## 2015-05-10 NOTE — Telephone Encounter (Signed)
Printed and faxed again

## 2015-05-11 NOTE — Telephone Encounter (Signed)
Called [facility] to make sure they received that fax this time. It was received.

## 2015-06-04 ENCOUNTER — Ambulatory Visit (INDEPENDENT_AMBULATORY_CARE_PROVIDER_SITE_OTHER): Payer: Medicare Other | Admitting: Family

## 2015-06-04 ENCOUNTER — Encounter: Payer: Self-pay | Admitting: Family

## 2015-06-04 VITALS — BP 126/84 | HR 59 | Temp 98.0°F | Resp 18 | Ht 61.0 in | Wt 196.0 lb

## 2015-06-04 DIAGNOSIS — G43809 Other migraine, not intractable, without status migrainosus: Secondary | ICD-10-CM

## 2015-06-04 DIAGNOSIS — F419 Anxiety disorder, unspecified: Secondary | ICD-10-CM

## 2015-06-04 DIAGNOSIS — F32A Depression, unspecified: Secondary | ICD-10-CM

## 2015-06-04 DIAGNOSIS — F329 Major depressive disorder, single episode, unspecified: Secondary | ICD-10-CM

## 2015-06-04 DIAGNOSIS — F418 Other specified anxiety disorders: Secondary | ICD-10-CM | POA: Diagnosis not present

## 2015-06-04 MED ORDER — SUMATRIPTAN SUCCINATE 50 MG PO TABS: ORAL_TABLET | ORAL | Status: DC

## 2015-06-04 MED ORDER — CLONAZEPAM 1 MG PO TABS: 1.0000 mg | ORAL_TABLET | Freq: Two times a day (BID) | ORAL | Status: DC

## 2015-06-04 MED ORDER — VORTIOXETINE HBR 10 MG PO TABS: 10.0000 mg | ORAL_TABLET | Freq: Every day | ORAL | Status: DC

## 2015-06-04 NOTE — Assessment & Plan Note (Signed)
Symptoms and exam consistent with migraine headache. Previously maintained on Fioricet with codeine. Has been out of medication for several weeks secondary to prior authorization needed. Start Imitrex. Follow up after several headaches determine effectiveness.

## 2015-06-04 NOTE — Progress Notes (Signed)
Subjective:    Patient ID: Catherine [name], female    DOB: May 23, 1956, 59 y.o.   MRN: [id number]  Chief Complaint  Patient presents with  . Follow-up    anxiety and depression, would like a refill of fioricet with codeine, has been having bad migraines lately, also states that the klonopin and wellbutrin aren't really helping much with anxiety and depression    HPI:  Catherine [name] is a 59 y.o. female with a PMH of hypertension, hyperlipidemia, anemia, leukocytosis, and anxiety/depression who presents today for an office follow-up.  1.) Migraines - has experienced increased amounts of headaches and is currently maintained on Fioricet with codeine. Indicates that she has been having continued migraines for two weeks and has not had medication. In the past she has tried Tramadol which did not seem to help. Cannot recall other attempted medications.  2.) Anxiety/Depression -  previously on Effexor with goal of tapering down Effexor and discontinuation. Was started on Wellbutrin and increased Klonopin for anxiety. Notes that her anxiety and depression remain labile with the current regimen. Effexor has been discontinued. 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 Expresses frustration with no relief.   Allergies  Allergen Reactions  . Ciprofloxacin Itching    Current Outpatient Prescriptions on File Prior to Visit  Medication Sig Dispense Refill  . albuterol (PROVENTIL HFA;VENTOLIN HFA) 108 (90 BASE) MCG/ACT inhaler Inhale 1-2 puffs into the lungs every 6 (six) hours as needed for wheezing or shortness of breath.    Marland Kitchen amLODipine (NORVASC) 10 MG tablet Take 10 mg by mouth every morning.    Marland Kitchen aspirin 81 MG chewable tablet Chew 81 mg by mouth daily.    . butalbital-acetaminophen-caffeine (FIORICET WITH CODEINE) 50-325-40-30 MG  per capsule Take 1 capsule by mouth every 4 (four) hours as needed for headache.    . carvedilol (COREG) 6.25 MG tablet Take 6.25 mg by mouth 2 (two) times daily with a meal.    . Cholecalciferol (VITAMIN D PO) Take 1 tablet by mouth daily.    . hydrocortisone 2.5 % lotion Apply topically 2 (two) times daily. 59 mL 0  . omeprazole (PRILOSEC) 20 MG capsule Take 20 mg by mouth daily before breakfast.    . rosuvastatin (CRESTOR) 10 MG tablet Take 10 mg by mouth at bedtime.     No current facility-administered medications on file prior to visit.    Review of Systems  Constitutional: Negative for fever and chills.  Neurological: Positive for headaches.  Psychiatric/Behavioral: Negative for suicidal ideas. The patient is nervous/anxious.       Objective:    BP 126/84 mmHg  Pulse 59  Temp(Src) 98 F (36.7 C) (Oral)  Resp 18  Ht 5\' 1"  (1.549 m)  Wt 196 lb (88.905 kg)  BMI 37.05 kg/m2  SpO2 97% Nursing note and vital signs reviewed.  Physical Exam  Constitutional: She is oriented to person, place, and time. She appears well-developed and well-nourished. No distress.  Cardiovascular: Normal rate, regular rhythm, normal heart sounds and intact distal pulses.   Pulmonary/Chest: Effort normal and breath sounds normal.  Neurological: She is alert and oriented to person, place, and time. She has normal reflexes. No cranial nerve deficit. Coordination normal.  Skin: Skin is warm and dry.  Psychiatric: Her behavior is normal. Judgment and thought  content normal. She exhibits a depressed mood.       Assessment & Plan:   Problem List Items Addressed This Visit      Cardiovascular and Mediastinum   Migraine - Primary    Symptoms and exam consistent with migraine headache. Previously maintained on Fioricet with codeine. Has been out of medication for several weeks secondary to prior authorization needed. Start Imitrex. Follow up after several headaches determine effectiveness.       Relevant Medications   SUMAtriptan (IMITREX) 50 MG tablet   Vortioxetine HBr 10 MG TABS   clonazePAM (KLONOPIN) 1 MG tablet     Other   Anxiety and depression    Successfully weaned off Effexor. Notes that her depression and anxiety have slightly increased with current regimen of Wellbutrin and Klonopin. Discontinue Wellbutrin. Start Vortioxetine. Refill clonazepam. Follow up in 1 month or sooner if symptoms worsen or fail to improve. Discussed seeking assistance for her mother to assist her in home care as caregiver role strain is most likely the underlying causes of her depression/anxiety.       Relevant Medications   Vortioxetine HBr 10 MG TABS   clonazePAM (KLONOPIN) 1 MG tablet

## 2015-06-04 NOTE — Patient Instructions (Addendum)
Thank you for choosing [facility].  Summary/Instructions:  Your prescription(s) have been submitted to your pharmacy or been printed and provided for you. Please take as directed and contact our office if you believe you are having problem(s) with the medication(s) or have any questions.  If your symptoms worsen or fail to improve, please contact our office for further instruction, or in case of emergency go directly to the emergency room at the closest medical facility.    Sumatriptan tablets What is this medicine? SUMATRIPTAN (soo ma TRIP tan) is used to treat migraines with or without aura. An aura is a strange feeling or visual disturbance that warns you of an attack. It is not used to prevent migraines. This medicine may be used for other purposes; ask your health care provider or pharmacist if you have questions. COMMON BRAND NAME(S): Imitrex What should I tell my health care provider before I take this medicine? They need to know if you have any of these conditions: -bowel disease or colitis -diabetes -family history of heart disease -fast or irregular heart beat -heart or blood vessel disease, angina (chest pain), or previous heart attack -high blood pressure -high cholesterol -history of stroke, transient ischemic attacks (TIAs or mini-strokes), or intracranial bleeding -kidney or liver disease -overweight -poor circulation -postmenopausal or surgical removal of uterus and ovaries -Raynaud's disease -seizure disorder -an unusual or allergic reaction to sumatriptan, other medicines, foods, dyes, or preservatives -pregnant or trying to get pregnant -breast-feeding How should I use this medicine? Take this medicine by mouth with a glass of water. Follow the directions on the prescription label. This medicine is taken at the first symptoms of a migraine. It is not for everyday use. If your migraine headache returns after one dose, you can take another dose as directed.  You must leave at least 2 hours between doses, and do not take more than 100 mg as a single dose. Do not take more than 200 mg total in any 24 hour period. If there is no improvement at all after the first dose, do not take a second dose without talking to your doctor or health care professional. Do not take your medicine more often than directed. Talk to your pediatrician regarding the use of this medicine in children. Special care may be needed. Overdosage: If you think you have taken too much of this medicine contact a poison control center or emergency room at once. NOTE: This medicine is only for you. Do not share this medicine with others. What if I miss a dose? This does not apply; this medicine is not for regular use. What may interact with this medicine? Do not take this medicine with any of the following medicines: -amphetamine or cocaine -dihydroergotamine, ergotamine, ergoloid mesylates, methysergide, or ergot-type medication - do not take within 24 hours of taking sumatriptan -feverfew -MAOIs like Carbex, Eldepryl, Marplan, Nardil, and Parnate - do not take sumatriptan within 2 weeks of stopping MAOI therapy -other migraine medicines like almotriptan, eletriptan, naratriptan, rizatriptan, zolmitriptan - do not take within 24 hours of taking sumatriptan -tryptophan This medicine may also interact with the following medications: -lithium -medicines for mental depression, anxiety or mood problems -medicines for weight loss such as dexfenfluramine, dextroamphetamine, fenfluramine, or sibutramine -St. John's wort This list may not describe all possible interactions. Give your health care provider a list of all the medicines, herbs, non-prescription drugs, or dietary supplements you use. Also tell them if you smoke, drink alcohol, or use illegal drugs. Some items  may interact with your medicine. What should I watch for while using this medicine? Only take this medicine for a migraine  headache. Take it if you get warning symptoms or at the start of a migraine attack. It is not for regular use to prevent migraine attacks. You may get drowsy or dizzy. Do not drive, use machinery, or do anything that needs mental alertness until you know how this medicine affects you. To reduce dizzy or fainting spells, do not sit or stand up quickly, especially if you are an older patient. Alcohol can increase drowsiness, dizziness and flushing. Avoid alcoholic drinks. Smoking cigarettes may increase the risk of heart-related side effects from using this medicine. If you take migraine medicines for 10 or more days a month, your migraines may get worse. Keep a diary of headache days and medicine use. Contact your healthcare professional if your migraine attacks occur more frequently. What side effects may I notice from receiving this medicine? Side effects that you should report to your doctor or health care professional as soon as possible: -allergic reactions like skin rash, itching or hives, swelling of the face, lips, or tongue -fast, slow, or irregular heart beat -hallucinations -increased or decreased blood pressure -seizures -severe stomach pain and cramping, bloody diarrhea -signs and symptoms of a blood clot such as breathing problems; changes in vision; chest pain; severe, sudden headache; pain, swelling, warmth in the leg; trouble speaking; sudden numbness or weakness of the face, arm or leg -tingling, pain, or numbness in the face, hands or feet Side effects that usually do not require medical attention (report to your doctor or health care professional if they continue or are bothersome): -drowsiness -feeling warm, flushing, or redness of the face -headache -muscle cramps, pain -nausea, vomiting -unusually weak or tired This list may not describe all possible side effects. Call your doctor for medical advice about side effects. You may report side effects to FDA at  1-800-FDA-1088. Where should I keep my medicine? Keep out of the reach of children. Store at room temperature between 2 and 30 degrees C (36 and 86 degrees F). Throw away any unused medicine after the expiration date. NOTE: This sheet is a summary. It may not cover all possible information. If you have questions about this medicine, talk to your doctor, pharmacist, or health care provider.  2015, Elsevier/Gold Standard. (2013-08-09 10:12:47)   Vortioxetine oral tablet What is this medicine? Vortioxetine (vor tee [employer] e teen) is used to treat depression. This medicine may be used for other purposes; ask your health care provider or pharmacist if you have questions. COMMON BRAND NAME(S): BRINTELLIX What should I tell my health care provider before I take this medicine? They need to know if you have any of these conditions: -bipolar disorder or a family history of bipolar disorder -bleeding disorders -drink alcohol -glaucoma -liver disease -low levels of sodium in the blood -seizures -suicidal thoughts, plans, or attempt; a previous suicide attempt by you or a family member -take medicines that treat or prevent blood clots -an unusual or allergic reaction to vortioxetine, other medicines, foods, dyes, or preservatives -pregnant or trying to get pregnant -breast-feeding How should I use this medicine? Take this medicine by mouth with a glass of water. Follow the directions on the prescription label. You can take it with or without food. If it upsets your stomach, take it with food. Take your medicine at regular intervals. Do not take it more often than directed. Do not stop taking this medicine  suddenly except upon the advice of your doctor. Stopping this medicine too quickly may cause serious side effects or your condition may worsen. A special MedGuide will be given to you by the pharmacist with each prescription and refill. Be sure to read this information carefully each time. Talk to your  pediatrician regarding the use of this medicine in children. Special care may be needed. Overdosage: If you think you've taken too much of this medicine contact a poison control center or emergency room at once. Overdosage: If you think you have taken too much of this medicine contact a poison control center or emergency room at once. NOTE: This medicine is only for you. Do not share this medicine with others. What if I miss a dose? If you miss a dose, take it as soon as you can. If it is almost time for your next dose, take only that dose. Do not take double or extra doses. What may interact with this medicine? Do not take this medicine with any of the following medications: -linezolid -MAOIs like Carbex, Eldepryl, Marplan, Nardil, and Parnate -methylene blue (injected into a vein) This medicine may also interact with the following medications: -alcohol -aspirin and aspirin-like medicines -carbamazepine -certain medicines for depression, anxiety, or psychotic disturbances -certain medicines for migraine headache like almotriptan, eletriptan, frovatriptan, naratriptan, rizatriptan, sumatriptan, zolmitriptan -diuretics -fentanyl -furazolidone -isoniazid -medicines that treat or prevent blood clots like warfarin, enoxaparin, and dalteparin -NSAIDs, medicines for pain and inflammation, like ibuprofen or naproxen -phenytoin -procarbazine -quinidine -rasagiline -rifampin -supplements like St. John's wort, kava kava, valerian -tramadol -tryptophan This list may not describe all possible interactions. Give your health care provider a list of all the medicines, herbs, non-prescription drugs, or dietary supplements you use. Also tell them if you smoke, drink alcohol, or use illegal drugs. Some items may interact with your medicine. What should I watch for while using this medicine? Tell your doctor if your symptoms do not get better or if they get worse. Visit your doctor or health care  professional for regular checks on your progress. Because it may take several weeks to see the full effects of this medicine, it is important to continue your treatment as prescribed by your doctor. Patients and their families should watch out for new or worsening thoughts of suicide or depression. Also watch out for sudden changes in feelings such as feeling anxious, agitated, panicky, irritable, hostile, aggressive, impulsive, severely restless, overly excited and hyperactive, or not being able to sleep. If this happens, especially at the beginning of treatment or after a change in dose, call your health care professional. Dennis Bast may get drowsy or dizzy. Do not drive, use machinery, or do anything that needs mental alertness until you know how this medicine affects you. Do not stand or sit up quickly, especially if you are an older patient. This reduces the risk of dizzy or fainting spells. Alcohol may interfere with the effect of this medicine. Avoid alcoholic drinks. Your mouth may get dry. Chewing sugarless gum or sucking hard candy, and drinking plenty of water may help. Contact your doctor if the problem does not go away or is severe. What side effects may I notice from receiving this medicine? Side effects that you should report to your doctor or health care professional as soon as possible: -allergic reactions like skin rash, itching or hives, swelling of the face, lips, or tongue -confusion -fast talking and excited feelings or actions that are out of control -feeling faint or lightheaded, falls -hallucination,  loss of contact with reality -seizures -suicidal thoughts or other mood changes -unusual bleeding or bruising -unusual body movements or muscle twitching -weakness Side effects that usually do not require medical attention (Report these to your doctor or health care professional if they continue or are bothersome.): -change in sex drive or  performance -constipation -diarrhea -dizziness -dry mouth -nausea This list may not describe all possible side effects. Call your doctor for medical advice about side effects. You may report side effects to FDA at 1-800-FDA-1088. Where should I keep my medicine? Keep out of the reach of children. Store at room temperature between 15 and 30 degrees C (59 and 86 degrees F). Throw away any unused medicine after the expiration date. NOTE: This sheet is a summary. It may not cover all possible information. If you have questions about this medicine, talk to your doctor, pharmacist, or health care provider.  2015, Elsevier/Gold Standard. (2013-07-01 13:22:25)

## 2015-06-04 NOTE — Progress Notes (Signed)
Pre visit review using our clinic review tool, if applicable. No additional management support is needed unless otherwise documented below in the visit note. 

## 2015-06-04 NOTE — Assessment & Plan Note (Signed)
Successfully weaned off Effexor. Notes that her depression and anxiety have slightly increased with current regimen of Wellbutrin and Klonopin. Discontinue Wellbutrin. Start Vortioxetine. Refill clonazepam. Follow up in 1 month or sooner if symptoms worsen or fail to improve. Discussed seeking assistance for her mother to assist her in home care as caregiver role strain is most likely the underlying causes of her depression/anxiety.

## 2015-06-07 ENCOUNTER — Telehealth: Payer: Self-pay | Admitting: Family

## 2015-06-07 DIAGNOSIS — G43809 Other migraine, not intractable, without status migrainosus: Secondary | ICD-10-CM

## 2015-06-07 NOTE — Telephone Encounter (Signed)
States sent klonopin out to patient to early.  States should have sent on the 20th and sent today the 16th.  Would like to know if needs to send out kit for patient to destroy.  Reference number is [id number].

## 2015-06-07 NOTE — Telephone Encounter (Signed)
[facility] sent her rx for klonopin today through the mail. She probably will get it by the 20th. Pt is upset that since she has been put on 1mg  of the klonopin that it has not been a 90 day supply with refills where as whoever prescribed her 0.5 mg a few times back gave her a 90 day supply with refills. I explained to pt that you do not do refills of controlled substances. She was still upset and wants to see if you will send it in as 90 day. Please advise.

## 2015-06-08 NOTE — Telephone Encounter (Signed)
I read pt the message below. I also told her that you would probably not send her the 90 day supply until the next refill is due bc [facility] has already sent her refill out for this month which she should be getting in a few days. She understood. Also pt wants to talk about increasing the quantity of her imitrex. I told her I would have to advise with you and you would not be back Monday. Please advise

## 2015-06-08 NOTE — Telephone Encounter (Signed)
That is fine. We can send a 90 day supply to [facility]. May need to be called in as I will not be able to sign and fax until Monday. So that will be 180 pills with no refills.

## 2015-06-08 NOTE — Telephone Encounter (Signed)
The only remaining thing that we can do with the imitrex is increase to 100 mg pills which the maximum still remains 2 pills which is the equivalent of 4 of the current pills. If she continues to have headaches than we may need to try additional or new agents.

## 2015-06-11 ENCOUNTER — Other Ambulatory Visit: Payer: Self-pay

## 2015-06-11 ENCOUNTER — Telehealth: Payer: Self-pay

## 2015-06-11 MED ORDER — SUMATRIPTAN SUCCINATE 50 MG PO TABS: ORAL_TABLET | ORAL | Status: DC

## 2015-06-11 NOTE — Telephone Encounter (Signed)
Pt asking for a new rx for symbicort. Please advise

## 2015-06-11 NOTE — Telephone Encounter (Signed)
Imitrex 60 tablets has been sent to pharmacy. Pt states that she needs the symbicort for SOB bc when she walks certain distances she gets short of breath.

## 2015-06-11 NOTE — Telephone Encounter (Signed)
Please determine what the purpose of the symbicort is as there is no medical indications in her chart.

## 2015-06-12 MED ORDER — BUDESONIDE-FORMOTEROL FUMARATE 80-4.5 MCG/ACT IN AERO: 2.0000 | INHALATION_SPRAY | Freq: Two times a day (BID) | RESPIRATORY_TRACT | Status: DC

## 2015-06-12 NOTE — Telephone Encounter (Signed)
Symbicort sent.

## 2015-06-12 NOTE — Telephone Encounter (Signed)
Patient would like all her medications sent [facility], even the medication that was sent to [facility] on [address], send it to [facility].

## 2015-06-13 ENCOUNTER — Telehealth: Payer: Self-pay | Admitting: Family

## 2015-06-13 ENCOUNTER — Other Ambulatory Visit: Payer: Self-pay | Admitting: Family

## 2015-06-13 DIAGNOSIS — F32A Depression, unspecified: Secondary | ICD-10-CM

## 2015-06-13 DIAGNOSIS — F419 Anxiety disorder, unspecified: Secondary | ICD-10-CM

## 2015-06-13 DIAGNOSIS — F329 Major depressive disorder, single episode, unspecified: Secondary | ICD-10-CM

## 2015-06-13 NOTE — Telephone Encounter (Signed)
Pt called back in, She was confused what was going on with the mail order.  I called [facility] meds were attempted to be Delivered on the 6/20 no one was there to sign for meds so they took them back to [facility], they will try to redeliver it tomorrow or thurs.  Explain to pt she will need to be there to sign for it.

## 2015-06-13 NOTE — Telephone Encounter (Signed)
Patient states she is out of clonazapam.  I asked her about the refill done on 6/13 and patient did not know anything about it.  Please call patient in regards.  Patient states she has called five times today.  Patient states she is out of meds.

## 2015-06-15 NOTE — Telephone Encounter (Signed)
Please apologize to the patient that she is going through this, however there is nothing that I am able to do as this is a matter between her and [facility].

## 2015-06-15 NOTE — Telephone Encounter (Signed)
Patient called in.  States she waited at post office for an hour and a 1/2 on her meds.. She states that the post office told her that her meds were sent [facility] to [facility].  Patient states she called [facility] and they told her that she did receive her medicine and that she was trying to get more medication.  I asked patient if she would like to have medication sent to a local pharmacy.  Patient hung up.

## 2015-06-18 MED ORDER — CLONAZEPAM 1 MG PO TABS: 1.0000 mg | ORAL_TABLET | Freq: Two times a day (BID) | ORAL | Status: DC

## 2015-06-18 NOTE — Telephone Encounter (Signed)
Patient need a new prescription of Clonazepam sent to  [facility] on [address].

## 2015-06-18 NOTE — Telephone Encounter (Signed)
Unfortunately her PCP did not seem willing to call to local pharmacy since she has already been dispensed this medication. His advice was to contact [facility]. Per [place] narcotic database this medication was filled/dispensed on 06/04/15 and cannot be refilled until 07/04/15. She can talk to [facility] about this and would need documentation from them that they did not dispense to patient.

## 2015-06-18 NOTE — Telephone Encounter (Signed)
Spoke with [facility].  Patient has not received med yet.  They state that the med is being shipped out again to patient.  They state they can do a one time override on this medication to allow insurance to cover a script to be sent to local pharmacy to help patient out until she gets her medication in the mail, but the local pharmacy would need a new written script.  Could this be an option?

## 2015-06-18 NOTE — Telephone Encounter (Signed)
How long will it take to get to patient? Can do script for that amount of time only.

## 2015-06-18 NOTE — Telephone Encounter (Signed)
If patient is to call back there are 2 options for patient. 1. She will need to talk to [facility] and discuss this 2. We can send her prescription to local pharmacy

## 2015-06-18 NOTE — Addendum Note (Signed)
Addended by: Vertell Novak A on: 06/18/2015 04:30 PM   Modules accepted: Orders

## 2015-06-18 NOTE — Telephone Encounter (Signed)
Spoke with patient and sent medication to [facility] on [address]. Dr. Doug Sou only approved enough for one week until the patient's medication arrives from her mail order pharmacy.

## 2015-06-18 NOTE — Telephone Encounter (Signed)
Spoke to patient. Advised that she should call her local pharmacy and get a price quote of script for clonazepam to see if it is a feasible option cost wise. She will call back to let us know.

## 2015-06-18 NOTE — Telephone Encounter (Signed)
Amy, will dr Doug Sou do this in gregs absence?

## 2015-06-18 NOTE — Telephone Encounter (Signed)
Spoke with [employer].  [employer] had incorrect mailing address ( [address] instead of [address]) on package.  Tracking department states package has not been delivered but they are not sure where package is.  Will call local office tomorrow 6/28.

## 2015-06-18 NOTE — Telephone Encounter (Signed)
Will give 1 week supply only, have printed please fax to expedite getting her medicine.

## 2015-06-19 ENCOUNTER — Telehealth: Payer: Self-pay | Admitting: Family

## 2015-06-19 NOTE — Telephone Encounter (Signed)
Called patient left vm. I verified that CMa Amy did sent temporary script via fax into [facility] @ [address] yesterday evening. She advised they should fill this since it is a 14 day script.

## 2015-06-19 NOTE — Telephone Encounter (Signed)
[facility] is calling to advise that they need authorization to issue a new order for clonazepam. [employer] lost the original. They also need agreement that we would be ok with filling both RX, should she manage to receive both. Please call [facility] @ contact info attached./

## 2015-06-20 NOTE — Telephone Encounter (Signed)
Patient has called in regards.  Is this something Dr. Doug Sou will be able to do?  Patient would like a call back tomorrow in regards.  Patient did states she was able to pick up the script Dr. Doug Sou sent to her local pharmacy.

## 2015-06-21 NOTE — Telephone Encounter (Signed)
Called and spoke with [facility] and authorized new rx.

## 2015-06-21 NOTE — Telephone Encounter (Signed)
Called and informed patient that [facility] will be sending out her medication.

## 2015-06-22 NOTE — Telephone Encounter (Signed)
If she wants to try splitting pills in half that's fine. I am unable to authorize any more medication for her.

## 2015-06-22 NOTE — Telephone Encounter (Signed)
Left message informing patient it is ok to split her medication.

## 2015-06-22 NOTE — Telephone Encounter (Signed)
Patient only has 6 pills left of the clonazePAM (KLONOPIN) 1 MG tablet [[id number]]. With the long holiday weekend she is concerned about not getting her medication in the mail till next week. She is wondering if it would be ok to split the pills in half.  Please advise

## 2015-06-24 ENCOUNTER — Other Ambulatory Visit: Payer: Self-pay | Admitting: Family

## 2015-06-26 NOTE — Telephone Encounter (Signed)
It appears as an authorization was already sent on 6/30 to refill the lost prescription.

## 2015-06-26 NOTE — Telephone Encounter (Signed)
Received a fax from [facility] requesting refill for klonopin. Please advise as the notes below indicate pt did not receive her medication in the mail

## 2015-06-27 NOTE — Telephone Encounter (Signed)
Reconfirmed 1 month supply was authorized to [facility].

## 2015-06-29 ENCOUNTER — Telehealth: Payer: Self-pay | Admitting: Family

## 2015-06-29 DIAGNOSIS — F419 Anxiety disorder, unspecified: Secondary | ICD-10-CM

## 2015-06-29 DIAGNOSIS — F329 Major depressive disorder, single episode, unspecified: Secondary | ICD-10-CM

## 2015-06-29 DIAGNOSIS — F32A Depression, unspecified: Secondary | ICD-10-CM

## 2015-06-29 MED ORDER — CLONAZEPAM 1 MG PO TABS: 1.0000 mg | ORAL_TABLET | Freq: Two times a day (BID) | ORAL | Status: DC

## 2015-06-29 NOTE — Telephone Encounter (Signed)
Medication refilled. And she will need an office visit for the remaining.

## 2015-06-29 NOTE — Telephone Encounter (Signed)
[facility] is needing a new prescription for her clonazePAM (KLONOPIN) 1 MG tablet [[id number] Also, Patient has cyst on left foot and is wondering what she can use for pain Please give patient call

## 2015-07-02 ENCOUNTER — Telehealth: Payer: Self-pay | Admitting: Family

## 2015-07-02 NOTE — Telephone Encounter (Signed)
Talked with patient. Told her her medication went to [facility].  I scheduled an appointment for her on 7/28

## 2015-07-02 NOTE — Telephone Encounter (Signed)
Patient has questions in regards to trintellix.  Is requesting call in regards.

## 2015-07-02 NOTE — Telephone Encounter (Signed)
Pt question   venlafaxin and clonazepam with the new med. It there any advisement regarding?

## 2015-07-03 NOTE — Telephone Encounter (Signed)
The patient is not supposed to be taking venlafexin as she was to be weaned off and stop taking the medication. Therefore she should be only taking the clonazepam and trintellix.

## 2015-07-03 NOTE — Telephone Encounter (Signed)
Patient called regarding the below. Advised of greg's note. She states that she will D/C Venlafexin effective immediatly and start taking trintellix today.

## 2015-07-03 NOTE — Telephone Encounter (Signed)
Noted pt called back.

## 2015-07-05 ENCOUNTER — Telehealth: Payer: Self-pay | Admitting: Family

## 2015-07-05 NOTE — Telephone Encounter (Signed)
Patient is requesting call back.  She would like to know how to take trintellix.  She would like to know if she should take with other meds.

## 2015-07-09 NOTE — Telephone Encounter (Signed)
She is able to take it with other medications and can take it with food if needed.

## 2015-07-10 NOTE — Telephone Encounter (Signed)
Called patient back and gave response.  Patient states she has appointment the 28th and she does not want to come in before that time.

## 2015-07-10 NOTE — Telephone Encounter (Signed)
Patient needs to come in for an office visit to discuss her medications. She was previously prescribed Trintellix IN PLACE OF her venlafaxine for her depression. She was supposed to follow up in a month. I am not sure where she is getting the venlafaxine from.

## 2015-07-10 NOTE — Telephone Encounter (Signed)
Gave patient response.  She would like to know how to take venlafaxine 75mg .  Patient states she tried to stay off of venlafaxine for a whole week.  She states she had a flushing sound in her head when she did this and once she took the venlafaxine it stopped.  Please advise.

## 2015-07-19 ENCOUNTER — Encounter: Payer: Self-pay | Admitting: Family

## 2015-07-19 ENCOUNTER — Ambulatory Visit (INDEPENDENT_AMBULATORY_CARE_PROVIDER_SITE_OTHER): Payer: Medicare Other | Admitting: Family

## 2015-07-19 VITALS — BP 110/80 | HR 65 | Temp 98.4°F | Resp 18 | Ht 61.0 in | Wt 197.8 lb

## 2015-07-19 DIAGNOSIS — F419 Anxiety disorder, unspecified: Secondary | ICD-10-CM

## 2015-07-19 DIAGNOSIS — F418 Other specified anxiety disorders: Secondary | ICD-10-CM

## 2015-07-19 DIAGNOSIS — F32A Depression, unspecified: Secondary | ICD-10-CM

## 2015-07-19 DIAGNOSIS — F329 Major depressive disorder, single episode, unspecified: Secondary | ICD-10-CM

## 2015-07-19 DIAGNOSIS — M71372 Other bursal cyst, left ankle and foot: Secondary | ICD-10-CM | POA: Diagnosis not present

## 2015-07-19 MED ORDER — ACETAMINOPHEN-CODEINE #3 300-30 MG PO TABS: 1.0000 | ORAL_TABLET | Freq: Four times a day (QID) | ORAL | Status: DC | PRN

## 2015-07-19 MED ORDER — CEPHALEXIN 500 MG PO CAPS: 500.0000 mg | ORAL_CAPSULE | Freq: Two times a day (BID) | ORAL | Status: DC

## 2015-07-19 NOTE — Assessment & Plan Note (Signed)
Non-compliance with current medication regimen as prescribed possible related to confusion about the regimen. Restart Trintellix. Continue current dosage of clonazepam. Patient informed that it may take 4-6 weeks for the medications to work. Denies suicidal ideation. Follow up in 1 month or sooner if symptoms worsen.

## 2015-07-19 NOTE — Patient Instructions (Addendum)
Thank you for choosing [facility].  Summary/Instructions:  Please take your Trintellix (Vortioxetine) as prescribed for your anxiety/depression.  Continue to take other medications as prescribed.  Start and complete the Keflex for your foot. Contact the [facility] for a follow up appointment.  Your prescription(s) have been submitted to your pharmacy or been printed and provided for you. Please take as directed and contact our office if you believe you are having problem(s) with the medication(s) or have any questions.  If your symptoms worsen or fail to improve, please contact our office for further instruction, or in case of emergency go directly to the emergency room at the closest medical facility.

## 2015-07-19 NOTE — Progress Notes (Signed)
Pre visit review using our clinic review tool, if applicable. No additional management support is needed unless otherwise documented below in the visit note. 

## 2015-07-19 NOTE — Assessment & Plan Note (Signed)
Symptoms and exam consistent with a small cyst possibly bursa or ganglion. Advised to return to foot center for further evaluation and treatment. Start Keflex to cover for infection. Start Tylenol #3 for pain as needed. Follow up if symptoms worsen or fail to improve.

## 2015-07-19 NOTE — Progress Notes (Signed)
Subjective:    Patient ID: Catherine [name], female    DOB: [birth year], 59 y.o.   MRN: [id number]  Chief Complaint  Patient presents with  . Follow-up    stayed off the effexor for a week and since then has a slushing noise in her head, states that she would like something for pain bc of a cyst on her right foot    HPI:  Catherine [name] is a 59 y.o. female with a PMH of hypertension, coronary artery disease, hyperlipidemia, anemia, and anxiety and depression who presents today for an office follow-up.    1.) Anxiety / Depression - Was previously weaned off Effexor and failed a trial of Wellbutrin. She was prescribed Vortioxetine and remains on clonazepam. There have been several issues regarding her clonazepam and refills through [facility]. She reports that she stopped taking the Effexor and noted a slushing noise in her head. Has not taken the Vortioxetine but has been taking the clonazepam as prescribed without adverse effects. Her anxiety still remains high with just the clonazepam.   2.) Cyst on foot - Associated symptom of a cyst located on her right foot has been there for about 1 month. Described as painful, swelling and tenderness. This is a reucurrance of a previous cyst that she was seen by the [facility]  Reports that they drained the cyst and it stated down for quite a while and now has come back. Pain is described as achy with severity of 7/10. Denies any modifying factors or treatments that were performed at home.   Allergies  Allergen Reactions  . Ciprofloxacin Itching    Current Outpatient Prescriptions on File Prior to Visit  Medication Sig Dispense Refill  . albuterol (PROVENTIL HFA;VENTOLIN HFA) 108 (90 BASE) MCG/ACT inhaler Inhale 1-2 puffs into the lungs every 6 (six) hours as needed for wheezing or shortness of breath.    Marland Kitchen amLODipine (NORVASC) 10 MG tablet Take 10 mg by mouth every morning.    Marland Kitchen aspirin 81 MG chewable tablet Chew 81 mg by mouth daily.    .  budesonide-formoterol (SYMBICORT) 80-4.5 MCG/ACT inhaler Inhale 2 puffs into the lungs 2 (two) times daily. 1 Inhaler 3  . butalbital-acetaminophen-caffeine (FIORICET WITH CODEINE) 50-325-40-30 MG per capsule Take 1 capsule by mouth every 4 (four) hours as needed for headache.    . carvedilol (COREG) 6.25 MG tablet Take 6.25 mg by mouth 2 (two) times daily with a meal.    . Cholecalciferol (VITAMIN D PO) Take 1 tablet by mouth daily.    . clonazePAM (KLONOPIN) 1 MG tablet Take 1 tablet (1 mg total) by mouth 2 (two) times daily. 60 tablet 0  . hydrocortisone 2.5 % lotion Apply topically 2 (two) times daily. 59 mL 0  . omeprazole (PRILOSEC) 20 MG capsule Take 20 mg by mouth daily before breakfast.    . rosuvastatin (CRESTOR) 10 MG tablet Take 10 mg by mouth at bedtime.    . SUMAtriptan (IMITREX) 50 MG tablet Take 1-2 tablets at the onset of headache and may repeat in 2 hours if headache persists or recurs. Max 4 tablets per day. 60 tablet 0  . SUMAtriptan (IMITREX) 50 MG tablet Take 1-2 tablets at the onset of headache and may repeat in 2 hours if headache persists or recurs. Max 4 tablets per day. 20 tablet 0  . TRINTELLIX 10 MG TABS Take 1 tablet by mouth  daily 90 tablet 0   No current facility-administered medications on file prior to  visit.    Past Medical History  Diagnosis Date  . Hypertension   . CAD (coronary artery disease)   . Anxiety     Review of Systems  Constitutional: Positive for chills. Negative for fever.  Skin: Positive for wound.  Psychiatric/Behavioral: Negative for suicidal ideas, confusion and dysphoric mood. The patient is nervous/anxious.       Objective:    BP 110/80 mmHg  Pulse 65  Temp(Src) 98.4 F (36.9 C) (Oral)  Resp 18  Ht 5\' 1"  (1.549 m)  Wt 197 lb 12.8 oz (89.721 kg)  BMI 37.39 kg/m2  SpO2 95% Nursing note and vital signs reviewed.  Physical Exam  Constitutional: She is oriented to person, place, and time. She appears well-developed and  well-nourished. No distress.  Cardiovascular: Normal rate, regular rhythm, normal heart sounds and intact distal pulses.   Pulmonary/Chest: Effort normal and breath sounds normal.  Neurological: She is alert and oriented to person, place, and time.  Skin: Skin is warm and dry.  0.5 cm cyst located on the dorsal aspect of the left foot proximal to the 3rd metatarsal. Firm, mobile and slightly tender to the touch.  Psychiatric: She has a normal mood and affect. Her behavior is normal. Judgment and thought content normal.       Assessment & Plan:   Problem List Items Addressed This Visit      Musculoskeletal and Integument   Other bursal cyst, left ankle and foot    Symptoms and exam consistent with a small cyst possibly bursa or ganglion. Advised to return to foot center for further evaluation and treatment. Start Keflex to cover for infection. Start Tylenol #3 for pain as needed. Follow up if symptoms worsen or fail to improve.       Relevant Medications   cephALEXin (KEFLEX) 500 MG capsule   acetaminophen-codeine (TYLENOL #3) 300-30 MG per tablet     Other   Anxiety and depression - Primary    Non-compliance with current medication regimen as prescribed possible related to confusion about the regimen. Restart Trintellix. Continue current dosage of clonazepam. Patient informed that it may take 4-6 weeks for the medications to work. Denies suicidal ideation. Follow up in 1 month or sooner if symptoms worsen.

## 2015-07-27 ENCOUNTER — Telehealth: Payer: Self-pay

## 2015-07-27 DIAGNOSIS — F32A Depression, unspecified: Secondary | ICD-10-CM

## 2015-07-27 DIAGNOSIS — F419 Anxiety disorder, unspecified: Secondary | ICD-10-CM

## 2015-07-27 DIAGNOSIS — M71372 Other bursal cyst, left ankle and foot: Secondary | ICD-10-CM

## 2015-07-27 DIAGNOSIS — F329 Major depressive disorder, single episode, unspecified: Secondary | ICD-10-CM

## 2015-07-27 NOTE — Telephone Encounter (Signed)
Patient states she has misplaced this med and is in pain.

## 2015-07-27 NOTE — Telephone Encounter (Signed)
Pt requesting refill of klonopin to be sent to [facility]

## 2015-07-30 ENCOUNTER — Other Ambulatory Visit: Payer: Self-pay | Admitting: Family

## 2015-07-30 ENCOUNTER — Telehealth: Payer: Self-pay | Admitting: Family

## 2015-07-30 ENCOUNTER — Telehealth: Payer: Self-pay

## 2015-07-30 DIAGNOSIS — F32A Depression, unspecified: Secondary | ICD-10-CM

## 2015-07-30 DIAGNOSIS — F329 Major depressive disorder, single episode, unspecified: Secondary | ICD-10-CM

## 2015-07-30 DIAGNOSIS — F419 Anxiety disorder, unspecified: Secondary | ICD-10-CM

## 2015-07-30 MED ORDER — CLONAZEPAM 1 MG PO TABS: 1.0000 mg | ORAL_TABLET | Freq: Two times a day (BID) | ORAL | Status: DC

## 2015-07-30 MED ORDER — CLONAZEPAM 1 MG PO TABS
1.0000 mg | ORAL_TABLET | Freq: Two times a day (BID) | ORAL | Status: DC
Start: 2015-07-30 — End: 2015-10-05

## 2015-07-30 MED ORDER — ACETAMINOPHEN-CODEINE #3 300-30 MG PO TABS: 1.0000 | ORAL_TABLET | Freq: Four times a day (QID) | ORAL | Status: DC | PRN

## 2015-07-30 NOTE — Telephone Encounter (Signed)
Medication printed and sign for pick up.

## 2015-07-30 NOTE — Telephone Encounter (Signed)
Can you please call pt regarding her prescription for klonapin

## 2015-07-30 NOTE — Telephone Encounter (Signed)
Called pt to let her know tylenol 3 was ready for pick up and klonopin had been [facility]ed [facility]. States she does not have a ride and doesn't know how she will pick up the tylenol 3.

## 2015-07-31 ENCOUNTER — Ambulatory Visit: Payer: Medicare Other | Admitting: Podiatry

## 2015-08-20 ENCOUNTER — Ambulatory Visit: Payer: Medicare Other | Admitting: Family

## 2015-08-28 ENCOUNTER — Telehealth: Payer: Self-pay

## 2015-08-28 ENCOUNTER — Ambulatory Visit: Payer: Self-pay | Admitting: Family

## 2015-08-28 NOTE — Telephone Encounter (Signed)
[facility] sent fax for Diclofenac refill for pt. Please advise.

## 2015-08-28 NOTE — Telephone Encounter (Signed)
What strength and route is it for?

## 2015-08-29 ENCOUNTER — Other Ambulatory Visit (INDEPENDENT_AMBULATORY_CARE_PROVIDER_SITE_OTHER): Payer: Medicare Other

## 2015-08-29 ENCOUNTER — Telehealth: Payer: Self-pay | Admitting: Family

## 2015-08-29 ENCOUNTER — Ambulatory Visit (INDEPENDENT_AMBULATORY_CARE_PROVIDER_SITE_OTHER): Payer: Medicare Other | Admitting: Family

## 2015-08-29 ENCOUNTER — Encounter: Payer: Self-pay | Admitting: Family

## 2015-08-29 VITALS — BP 138/82 | HR 81 | Temp 98.4°F | Resp 12 | Ht 61.0 in | Wt 198.1 lb

## 2015-08-29 DIAGNOSIS — F32A Depression, unspecified: Secondary | ICD-10-CM

## 2015-08-29 DIAGNOSIS — M71372 Other bursal cyst, left ankle and foot: Secondary | ICD-10-CM

## 2015-08-29 DIAGNOSIS — F418 Other specified anxiety disorders: Secondary | ICD-10-CM | POA: Diagnosis not present

## 2015-08-29 DIAGNOSIS — I10 Essential (primary) hypertension: Secondary | ICD-10-CM

## 2015-08-29 DIAGNOSIS — F329 Major depressive disorder, single episode, unspecified: Secondary | ICD-10-CM

## 2015-08-29 DIAGNOSIS — Z23 Encounter for immunization: Secondary | ICD-10-CM | POA: Diagnosis not present

## 2015-08-29 DIAGNOSIS — F419 Anxiety disorder, unspecified: Secondary | ICD-10-CM

## 2015-08-29 LAB — BASIC METABOLIC PANEL
BUN: 12 mg/dL (ref 6–23)
CO2: 30 mEq/L (ref 19–32)
Calcium: 10.2 mg/dL (ref 8.4–10.5)
Chloride: 102 mEq/L (ref 96–112)
Creatinine, Ser: 0.92 mg/dL (ref 0.40–1.20)
GFR: 79.86 mL/min (ref >60.00)
Glucose, Bld: 86 mg/dL (ref 70–99)
Potassium: 3.1 mEq/L — ABNORMAL LOW (ref 3.5–5.1)
Sodium: 139 mEq/L (ref 135–145)

## 2015-08-29 LAB — CBC
HCT: 37.3 % (ref 36.0–46.0)
Hemoglobin: 12.5 g/dL (ref 12.0–15.0)
MCHC: 33.4 g/dL (ref 30.0–36.0)
MCV: 79.6 fl (ref 78.0–100.0)
Platelets: 274 10*3/uL (ref 150.0–400.0)
RBC: 4.69 Mil/uL (ref 3.87–5.11)
RDW: 14.8 % (ref 11.5–15.5)
WBC: 4 10*3/uL (ref 4.0–10.5)

## 2015-08-29 MED ORDER — ACETAMINOPHEN-CODEINE #3 300-30 MG PO TABS: 1.0000 | ORAL_TABLET | Freq: Four times a day (QID) | ORAL | Status: DC | PRN

## 2015-08-29 NOTE — Progress Notes (Signed)
Pre visit review using our clinic review tool, if applicable. No additional management support is needed unless otherwise documented below in the visit note. 

## 2015-08-29 NOTE — Telephone Encounter (Signed)
Please inform patient that her kidney function, electrolytes, and white/red blood cells are within the normal limits. Therefore we will continue to monitor your kidney function as needed.

## 2015-08-29 NOTE — Assessment & Plan Note (Signed)
Hypertension appears well controlled with current regimen of carvedilol and amlodipine and below goal of 140/90. Denies adverse side effects. Obtain BMET and CBC. Continue current dose of carvedilol and amlodipine. Continue to monitor blood pressure at home.

## 2015-08-29 NOTE — Telephone Encounter (Signed)
Sorry pt is requesting diclofenac. She is not currently taking it so I don't know the dose or route.

## 2015-08-29 NOTE — Telephone Encounter (Signed)
Please address when patient calls back.

## 2015-08-29 NOTE — Progress Notes (Signed)
Subjective:    Patient ID: Catherine [name], female    DOB: 09/11/1956, 59 y.o.   MRN: [id number]  Chief Complaint  Patient presents with  . Follow-up    complains of  bones aching all over    HPI:  Catherine [name] is a 59 y.o. female with a PMH of hypertension, coronary artery disease, GERD, hyperlipidemia, anemia, anxiety, depression, and insomnia who presents today for a follow-up office visit.    1.) Anxiety and depression - Indicates some improvement with the Trintelix and the clonzapepam. Takes the medications as prescribed and denies adverse side effects. Currently takes the clonazepam twice daily. Overall anxiety is slightly improved with current regimen and declines an increased need a medication.  2.) Hypertension - Currently maintained on amlodipine and carvedilol. Reports her blood pressure at home is pretty good with readings around 128/78. Takes the medication as prescribed and denies adverse side effects.  BP Readings from Last 3 Encounters:  08/29/15 138/82  07/19/15 110/80  06/04/15 126/84    Allergies  Allergen Reactions  . Ciprofloxacin Itching    Current Outpatient Prescriptions on File Prior to Visit  Medication Sig Dispense Refill  . albuterol (PROVENTIL HFA;VENTOLIN HFA) 108 (90 BASE) MCG/ACT inhaler Inhale 1-2 puffs into the lungs every 6 (six) hours as needed for wheezing or shortness of breath.    Marland Kitchen amLODipine (NORVASC) 10 MG tablet Take 10 mg by mouth every morning.    Marland Kitchen aspirin 81 MG chewable tablet Chew 81 mg by mouth daily.    . budesonide-formoterol (SYMBICORT) 80-4.5 MCG/ACT inhaler Inhale 2 puffs into the lungs 2 (two) times daily. 1 Inhaler 3  . butalbital-acetaminophen-caffeine (FIORICET WITH CODEINE) 50-325-40-30 MG per capsule Take 1 capsule by mouth every 4 (four) hours as needed for headache.    . carvedilol (COREG) 6.25 MG tablet Take 6.25 mg by mouth 2 (two) times daily with a meal.    . Cholecalciferol (VITAMIN D PO) Take 1 tablet by mouth  daily.    . clonazePAM (KLONOPIN) 1 MG tablet Take 1 tablet (1 mg total) by mouth 2 (two) times daily. 60 tablet 2  . omeprazole (PRILOSEC) 20 MG capsule Take 20 mg by mouth daily before breakfast.    . rosuvastatin (CRESTOR) 10 MG tablet Take 10 mg by mouth at bedtime.    . SUMAtriptan (IMITREX) 50 MG tablet Take 1-2 tablets at the onset of headache and may repeat in 2 hours if headache persists or recurs. Max 4 tablets per day. 20 tablet 0  . TRINTELLIX 10 MG TABS Take 1 tablet by mouth  daily 90 tablet 0  . hydrocortisone 2.5 % lotion Apply topically 2 (two) times daily. (Patient not taking: Reported on 08/29/2015) 59 mL 0  . SUMAtriptan (IMITREX) 50 MG tablet Take 1-2 tablets at the onset of headache and may repeat in 2 hours if headache persists or recurs. Max 4 tablets per day. (Patient not taking: Reported on 08/29/2015) 60 tablet 0   No current facility-administered medications on file prior to visit.     Review of Systems  Constitutional: Negative for fever and chills.  Eyes:       Negative for changes in vision.  Respiratory: Negative for chest tightness and shortness of breath.   Cardiovascular: Negative for chest pain, palpitations and leg swelling.  Neurological: Negative for headaches.      Objective:    BP 138/82 mmHg  Pulse 81  Temp(Src) 98.4 F (36.9 C) (Oral)  Resp 12  Ht  5\' 1"  (1.549 m)  Wt 198 lb 1.9 oz (89.867 kg)  BMI 37.45 kg/m2  SpO2 91% Nursing note and vital signs reviewed.  Physical Exam  Constitutional: She is oriented to person, place, and time. She appears well-developed and well-nourished. No distress.  Cardiovascular: Normal rate, regular rhythm, normal heart sounds and intact distal pulses.  Exam reveals no gallop and no friction rub.   No murmur heard. Pulmonary/Chest: Effort normal and breath sounds normal. She has no wheezes. She has no rales.  Neurological: She is alert and oriented to person, place, and time.  Skin: Skin is warm and dry.    Psychiatric: She has a normal mood and affect. Her behavior is normal. Judgment and thought content normal.       Assessment & Plan:   Problem List Items Addressed This Visit      Cardiovascular and Mediastinum   Essential hypertension - Primary    Hypertension appears well controlled with current regimen of carvedilol and amlodipine and below goal of 140/90. Denies adverse side effects. Obtain BMET and CBC. Continue current dose of carvedilol and amlodipine. Continue to monitor blood pressure at home.       Relevant Orders   Basic Metabolic Panel (BMET) (Completed)   CBC (Completed)     Musculoskeletal and Integument   Other bursal cyst, left ankle and foot   Relevant Medications   acetaminophen-codeine (TYLENOL #3) 300-30 MG per tablet     Other   Anxiety and depression    Anxiety and depression are improved with current regimen of Trintellix and clonazepam. Denies adverse side effects. Denies suicidal ideation. Continue current dosage of Trintellix and clonazepam. Follow up in 3 months or sooner if needed.        Other Visit Diagnoses    Encounter for immunization

## 2015-08-29 NOTE — Assessment & Plan Note (Signed)
Anxiety and depression are improved with current regimen of Trintellix and clonazepam. Denies adverse side effects. Denies suicidal ideation. Continue current dosage of Trintellix and clonazepam. Follow up in 3 months or sooner if needed.

## 2015-08-29 NOTE — Patient Instructions (Signed)
Thank you for choosing [facility].  Summary/Instructions:  Contact the [facility]  Please schedule a time for your physical.  Please continue to take your medications as prescribed.   Your prescription(s) have been submitted to your pharmacy or been printed and provided for you. Please take as directed and contact our office if you believe you are having problem(s) with the medication(s) or have any questions.  Please stop by the lab on the basement level of the building for your blood work. Your results will be released to [facility] (or called to you) after review, usually within 72 hours after test completion. If any changes need to be made, you will be notified at that same time.  If your symptoms worsen or fail to improve, please contact our office for further instruction, or in case of emergency go directly to the emergency room at the closest medical facility.

## 2015-08-30 NOTE — Telephone Encounter (Signed)
Pt aware of results 

## 2015-08-30 NOTE — Telephone Encounter (Signed)
Spoke with pt about this issue and she states that she is not requesting for the diclofenac. I told her that I would just ignore the request if she does not need it. Pt understood.

## 2015-09-03 ENCOUNTER — Other Ambulatory Visit: Payer: Self-pay

## 2015-09-03 DIAGNOSIS — G43809 Other migraine, not intractable, without status migrainosus: Secondary | ICD-10-CM

## 2015-09-03 MED ORDER — SUMATRIPTAN SUCCINATE 50 MG PO TABS: ORAL_TABLET | ORAL | Status: DC

## 2015-09-13 ENCOUNTER — Other Ambulatory Visit: Payer: Self-pay | Admitting: Family

## 2015-09-14 ENCOUNTER — Telehealth: Payer: Self-pay

## 2015-09-14 NOTE — Telephone Encounter (Signed)
[facility] sent medication request for clonazepam, last refill was 8/8 and had 2 refills. Is refill appropriate?

## 2015-09-14 NOTE — Telephone Encounter (Signed)
Noted! Thank you

## 2015-09-14 NOTE — Telephone Encounter (Signed)
Please check with [facility] to see if there are refills available and if so patient needs to use refills first.

## 2015-09-14 NOTE — Telephone Encounter (Signed)
Spoke to [facility] and there is refills. The request if for a 90 day supply instead of a 30 day supply next time it is due. The last refill will sent out to the pt on 09/20/15.  Please advise at your convenience.

## 2015-10-02 ENCOUNTER — Other Ambulatory Visit (INDEPENDENT_AMBULATORY_CARE_PROVIDER_SITE_OTHER): Payer: Medicare Other

## 2015-10-02 ENCOUNTER — Telehealth: Payer: Self-pay | Admitting: Family

## 2015-10-02 ENCOUNTER — Encounter: Payer: Self-pay | Admitting: Family

## 2015-10-02 ENCOUNTER — Ambulatory Visit (INDEPENDENT_AMBULATORY_CARE_PROVIDER_SITE_OTHER): Payer: Medicare Other | Admitting: Family

## 2015-10-02 VITALS — BP 124/84 | HR 65 | Resp 18 | Ht 61.0 in | Wt 199.8 lb

## 2015-10-02 DIAGNOSIS — N898 Other specified noninflammatory disorders of vagina: Secondary | ICD-10-CM

## 2015-10-02 LAB — WET PREP, GENITAL
Trich, Wet Prep: NONE SEEN — AB
WBC, Wet Prep HPF POC: NONE SEEN — AB
Yeast Wet Prep HPF POC: NONE SEEN — AB

## 2015-10-02 LAB — URINALYSIS
Bilirubin Urine: NEGATIVE
Hgb urine dipstick: NEGATIVE
Ketones, ur: NEGATIVE
Leukocytes, UA: NEGATIVE
Nitrite: NEGATIVE
Specific Gravity, Urine: 1.01 (ref 1.000–1.030)
Urine Glucose: NEGATIVE
Urobilinogen, UA: 0.2 (ref 0.0–1.0)
pH: 6.5 (ref 5.0–8.0)

## 2015-10-02 MED ORDER — METRONIDAZOLE 500 MG PO TABS: 500.0000 mg | ORAL_TABLET | Freq: Two times a day (BID) | ORAL | Status: DC

## 2015-10-02 NOTE — Patient Instructions (Signed)
Thank you for choosing [facility].  Summary/Instructions:  Your prescription(s) have been submitted to your pharmacy or been printed and provided for you. Please take as directed and contact our office if you believe you are having problem(s) with the medication(s) or have any questions.  Please stop by the lab on the basement level of the building for your blood work. Your results will be released to [place] (or called to you) after review, usually within 72 hours after test completion. If any changes need to be made, you will be notified at that same time.  If your symptoms worsen or fail to improve, please contact our office for further instruction, or in case of emergency go directly to the emergency room at the closest medical facility.   Sexually Transmitted Disease A sexually transmitted disease (STD) is a disease or infection that may be passed (transmitted) from person to person, usually during sexual activity. This may happen by way of saliva, semen, blood, vaginal mucus, or urine. Common STDs include:  Gonorrhea.  Chlamydia.  Syphilis.  HIV and AIDS.  Genital herpes.  Hepatitis B and C.  Trichomonas.  Human papillomavirus (HPV).  Pubic lice.  Scabies.  Mites.  Bacterial vaginosis. WHAT ARE CAUSES OF STDs? An STD may be caused by bacteria, a virus, or parasites. STDs are often transmitted during sexual activity if one person is infected. However, they may also be transmitted through nonsexual means. STDs may be transmitted after:   Sexual intercourse with an infected person.  Sharing sex toys with an infected person.  Sharing needles with an infected person or using unclean piercing or tattoo needles.  Having intimate contact with the genitals, mouth, or rectal areas of an infected person.  Exposure to infected fluids during birth. WHAT ARE THE SIGNS AND SYMPTOMS OF STDs? Different STDs have different symptoms. Some people may not have any symptoms.  If symptoms are present, they may include:  Painful or bloody urination.  Pain in the pelvis, abdomen, vagina, anus, throat, or eyes.  A skin rash, itching, or irritation.  Growths, ulcerations, blisters, or sores in the genital and anal areas.  Abnormal vaginal discharge with or without bad odor.  Penile discharge in men.  Fever.  Pain or bleeding during sexual intercourse.  Swollen glands in the groin area.  Yellow skin and eyes (jaundice). This is seen with hepatitis.  Swollen testicles.  Infertility.  Sores and blisters in the mouth. HOW ARE STDs DIAGNOSED? To make a diagnosis, your health care provider may:  Take a medical history.  Perform a physical exam.  Take a sample of any discharge to examine.  Swab the throat, cervix, opening to the penis, rectum, or vagina for testing.  Test a sample of your first morning urine.  Perform blood tests.  Perform a Pap test, if this applies.  Perform a colposcopy.  Perform a laparoscopy. HOW ARE STDs TREATED? Treatment depends on the STD. Some STDs may be treated but not cured.  Chlamydia, gonorrhea, trichomonas, and syphilis can be cured with antibiotic medicine.  Genital herpes, hepatitis, and HIV can be treated, but not cured, with prescribed medicines. The medicines lessen symptoms.  Genital warts from HPV can be treated with medicine or by freezing, burning (electrocautery), or surgery. Warts may come back.  HPV cannot be cured with medicine or surgery. However, abnormal areas may be removed from the cervix, vagina, or vulva.  If your diagnosis is confirmed, your recent sexual partners need treatment. This is true even if  they are symptom-free or have a negative culture or evaluation. They should not have sex until their health care providers say it is okay.  Your health care provider may test you for infection again 3 months after treatment. HOW CAN I REDUCE MY RISK OF GETTING AN STD? Take these steps to  reduce your risk of getting an STD:  Use latex condoms, dental dams, and water-soluble lubricants during sexual activity. Do not use petroleum jelly or oils.  Avoid having multiple sex partners.  Do not have sex with someone who has other sex partners  Do not have sex with anyone you do not know or who is at high risk for an STD.  Avoid risky sex practices that can break your skin.  Do not have sex if you have open sores on your mouth or skin.  Avoid drinking too much alcohol or taking illegal drugs. Alcohol and drugs can affect your judgment and put you in a vulnerable position.  Avoid engaging in oral and anal sex acts.  Get vaccinated for HPV and hepatitis. If you have not received these vaccines in the past, talk to your health care provider about whether one or both might be right for you.  If you are at risk of being infected with HIV, it is recommended that you take a prescription medicine daily to prevent HIV infection. This is called pre-exposure prophylaxis (PrEP). You are considered at risk if:  You are a man who has sex with other men (MSM).  You are a heterosexual man or woman and are sexually active with more than one partner.  You take drugs by injection.  You are sexually active with a partner who has HIV.  Talk with your health care provider about whether you are at high risk of being infected with HIV. If you choose to begin PrEP, you should first be tested for HIV. You should then be tested every 3 months for as long as you are taking PrEP. WHAT SHOULD I DO IF I THINK I HAVE AN STD?  See your health care provider.  Tell your sexual partner(s). They should be tested and treated for any STDs.  Do not have sex until your health care provider says it is okay. WHEN SHOULD I GET IMMEDIATE MEDICAL CARE? Contact your health care provider right away if:   You have severe abdominal pain.  You are a man and notice swelling or pain in your testicles.  You are a  woman and notice swelling or pain in your vagina.   This information is not intended to replace advice given to you by your health care provider. Make sure you discuss any questions you have with your health care provider.   Document Released: 02/28/2003 Document Revised: 12/29/2014 Document Reviewed: 06/28/2013 Elsevier Interactive Patient Education [employer].

## 2015-10-02 NOTE — Progress Notes (Signed)
Subjective:    Patient ID: Catherine [name], female    DOB: 12/24/1955, 59 y.o.   MRN: [id number]  Chief Complaint  Patient presents with  . Vaginal issues    vaginal itching, white discharge, no odor, was bleeding but the bleeding stopped, sorness around the area    HPI:  Catherine [name] is a 59 y.o. female who  has a past medical history of Hypertension; CAD (coronary artery disease); and Anxiety. and presents today for an acute office visit.   Vaginal issues - Associated symptoms of vaginal itching and white discharge has been going for approximately 2 weeks. Started initially with blood after intercourse and the bleeding has since stopped. Denies any modifying treatments attempted. Denies fevers or chills. Does have mild lower abdominal pain. Over the course of the 2 weeks the symptoms have generally stayed about the same. Believes that she may have been dry as to what led to the bleeding. There was a sensation of dryness during intercourse. Has not had this problem before.   Allergies  Allergen Reactions  . Ciprofloxacin Itching     Current Outpatient Prescriptions on File Prior to Visit  Medication Sig Dispense Refill  . acetaminophen-codeine (TYLENOL #3) 300-30 MG per tablet Take 1 tablet by mouth every 6 (six) hours as needed for moderate pain or severe pain. 90 tablet 0  . albuterol (PROVENTIL HFA;VENTOLIN HFA) 108 (90 BASE) MCG/ACT inhaler Inhale 1-2 puffs into the lungs every 6 (six) hours as needed for wheezing or shortness of breath.    Marland Kitchen amLODipine (NORVASC) 10 MG tablet Take 10 mg by mouth every morning.    Marland Kitchen aspirin 81 MG chewable tablet Chew 81 mg by mouth daily.    . butalbital-acetaminophen-caffeine (FIORICET WITH CODEINE) 50-325-40-30 MG per capsule Take 1 capsule by mouth every 4 (four) hours as needed for headache.    . carvedilol (COREG) 6.25 MG tablet Take 6.25 mg by mouth 2 (two) times daily with a meal.    . Cholecalciferol (VITAMIN D PO) Take 1 tablet by mouth  daily.    . clonazePAM (KLONOPIN) 1 MG tablet Take 1 tablet (1 mg total) by mouth 2 (two) times daily. 60 tablet 2  . hydrocortisone 2.5 % lotion Apply topically 2 (two) times daily. 59 mL 0  . omeprazole (PRILOSEC) 20 MG capsule Take 20 mg by mouth daily before breakfast.    . rosuvastatin (CRESTOR) 10 MG tablet Take 10 mg by mouth at bedtime.    . SUMAtriptan (IMITREX) 50 MG tablet Take 1-2 tablets at the onset of headache and may repeat in 2 hours if headache persists or recurs. Max 4 tablets per day. 60 tablet 0  . SUMAtriptan (IMITREX) 50 MG tablet Take 1-2 tablets at the onset of headache and may repeat in 2 hours if headache persists or recurs. Max 4 tablets per day. 20 tablet 0  . SYMBICORT 80-4.5 MCG/ACT inhaler Use 2 puffs two times daily 10.2 g 1  . TRINTELLIX 10 MG TABS Take 1 tablet by mouth  daily 90 tablet 0   No current facility-administered medications on file prior to visit.     Past Surgical History  Procedure Laterality Date  . Tubal ligation      Review of Systems  Constitutional: Negative for fever and chills.  Gastrointestinal: Positive for abdominal pain.  Genitourinary: Positive for frequency, vaginal discharge and dyspareunia. Negative for dysuria, urgency and hematuria.      Objective:    BP 124/84 mmHg  Pulse  65  Resp 18  Ht 5\' 1"  (1.549 m)  Wt 199 lb 12.8 oz (90.629 kg)  BMI 37.77 kg/m2  SpO2 98% Nursing note and vital signs reviewed.  Physical Exam  Constitutional: She is oriented to person, place, and time. She appears well-developed and well-nourished. No distress.  Cardiovascular: Normal rate, regular rhythm, normal heart sounds and intact distal pulses.   Pulmonary/Chest: Effort normal and breath sounds normal.  Genitourinary: There is no rash, tenderness, lesion or injury on the right labia. There is no rash, tenderness, lesion or injury on the left labia. No erythema, tenderness or bleeding in the vagina. No foreign body around the vagina.  No signs of injury around the vagina. Vaginal discharge found.  Neurological: She is alert and oriented to person, place, and time.  Skin: Skin is warm and dry.  Psychiatric: She has a normal mood and affect. Her behavior is normal. Judgment and thought content normal.        Assessment & Plan:   Problem List Items Addressed This Visit      Other   Vaginal discharge - Primary    No obvious injury upon examination. Wet prep obtained. Obtain RPR, HIV, HSV 1 & 2, urinalysis, and Gc/Chlamydia probe to rule out other STIs. Low concern for PID. Treatment pending lab results.       Relevant Orders   GC/chlamydia probe amp, urine   HIV antibody   RPR   HSV 1 antibody, IgG   HSV 2 antibody, IgG   Urinalysis (Completed)   Wet prep, genital (Completed)

## 2015-10-02 NOTE — Assessment & Plan Note (Signed)
No obvious injury upon examination. Wet prep obtained. Obtain RPR, HIV, HSV 1 & 2, urinalysis, and Gc/Chlamydia probe to rule out other STIs. Low concern for PID. Treatment pending lab results.

## 2015-10-02 NOTE — Telephone Encounter (Signed)
Please inform the patient that the wet prep test was positive for bacterial vaginosis. Therefore I have sent in a prescription for metronidazole to her pharamcy. We will be back in touch once we get other results.

## 2015-10-02 NOTE — Progress Notes (Signed)
Pre visit review using our clinic review tool, if applicable. No additional management support is needed unless otherwise documented below in the visit note. 

## 2015-10-03 ENCOUNTER — Telehealth: Payer: Self-pay | Admitting: Family

## 2015-10-03 LAB — RPR

## 2015-10-03 LAB — HIV ANTIBODY (ROUTINE TESTING W REFLEX): HIV 1&2 Ab, 4th Generation: NONREACTIVE

## 2015-10-03 LAB — HSV 2 ANTIBODY, IGG: HSV 2 Glycoprotein G Ab, IgG: 7.7 IV — ABNORMAL HIGH

## 2015-10-03 LAB — HSV 1 ANTIBODY, IGG: HSV 1 Glycoprotein G Ab, IgG: 6.84 IV — ABNORMAL HIGH

## 2015-10-03 NOTE — Telephone Encounter (Signed)
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 While not currently active there is no treatment that is necessary at this time. If she develops oral or genital outbreaks we can treat her with antiviral medications. All of her other blood work is negative to this point.

## 2015-10-03 NOTE — Telephone Encounter (Signed)
Patient is requesting call back in regards to why she had a bacterial infection

## 2015-10-03 NOTE — Telephone Encounter (Signed)
LVM for pt to call back.

## 2015-10-04 NOTE — Telephone Encounter (Signed)
Pt aware of results 

## 2015-10-04 NOTE — Telephone Encounter (Signed)
Tried calling pt. LVM for her to call back.

## 2015-10-05 ENCOUNTER — Telehealth: Payer: Self-pay

## 2015-10-05 DIAGNOSIS — F329 Major depressive disorder, single episode, unspecified: Secondary | ICD-10-CM

## 2015-10-05 DIAGNOSIS — F419 Anxiety disorder, unspecified: Secondary | ICD-10-CM

## 2015-10-05 DIAGNOSIS — F32A Depression, unspecified: Secondary | ICD-10-CM

## 2015-10-05 MED ORDER — CLONAZEPAM 1 MG PO TABS: 1.0000 mg | ORAL_TABLET | Freq: Two times a day (BID) | ORAL | Status: DC

## 2015-10-05 NOTE — Telephone Encounter (Signed)
Pharmacy sent refill request for clonazepam. Last refill was 8/8 with 2 refills. Please advise.

## 2015-10-05 NOTE — Telephone Encounter (Signed)
Medication refilled

## 2015-10-08 ENCOUNTER — Telehealth: Payer: Self-pay | Admitting: Family

## 2015-10-08 DIAGNOSIS — R894 Abnormal immunological findings in specimens from other organs, systems and tissues: Secondary | ICD-10-CM

## 2015-10-08 NOTE — Telephone Encounter (Signed)
Pt called request to speak to the assistant concern about the test result that she got. Pt was wondering if we can do another to confirm this, because she is uncertain with the result? Please advise.

## 2015-10-09 NOTE — Telephone Encounter (Signed)
Called pt back. Informed her that another blood test was put in to confirm results. Pt aware.

## 2015-10-11 ENCOUNTER — Telehealth: Payer: Self-pay | Admitting: Family

## 2015-10-11 NOTE — Telephone Encounter (Signed)
Pt request to speak to the assistant concern about antibiotic that she was on last week (she wants to know why she was on it?) please call her back

## 2015-10-16 MED ORDER — BUTALBITAL-APAP-CAFF-COD 50-325-40-30 MG PO CAPS: 1.0000 | ORAL_CAPSULE | ORAL | Status: DC | PRN

## 2015-10-16 NOTE — Telephone Encounter (Signed)
Talked to pt. Fioricet sent to [facility]. Explained to pt why she was taking the antibiotic last week.

## 2015-10-16 NOTE — Telephone Encounter (Signed)
Patient is following up on this. Please give the patient a call   Patient is also asking for a PA to get her butalbital-acetaminophen-caffeine (FIORICET WITH CODEINE) 50-325-40-30 MG per capsule [[id number]] from [facility]

## 2015-10-25 ENCOUNTER — Telehealth: Payer: Self-pay | Admitting: Family

## 2015-10-25 NOTE — Telephone Encounter (Signed)
Patient states she is out of clonazepam.  She is waiting on [facility].  Would like to know what she needs to do while she is waiting.

## 2015-10-30 ENCOUNTER — Telehealth: Payer: Self-pay | Admitting: Family

## 2015-11-02 ENCOUNTER — Telehealth: Payer: Self-pay | Admitting: Family

## 2015-11-02 DIAGNOSIS — M71372 Other bursal cyst, left ankle and foot: Secondary | ICD-10-CM

## 2015-11-02 DIAGNOSIS — Z01419 Encounter for gynecological examination (general) (routine) without abnormal findings: Secondary | ICD-10-CM

## 2015-11-02 MED ORDER — ACETAMINOPHEN-CODEINE #3 300-30 MG PO TABS: 1.0000 | ORAL_TABLET | Freq: Four times a day (QID) | ORAL | Status: DC | PRN

## 2015-11-02 NOTE — Telephone Encounter (Signed)
Pt request refill for TYLENOL #3, please help she is out of this medication.

## 2015-11-02 NOTE — Telephone Encounter (Signed)
Medication printed and signed.

## 2015-11-02 NOTE — Telephone Encounter (Signed)
Please advise last refill 9/7

## 2015-11-05 NOTE — Telephone Encounter (Signed)
Noted! Thank you

## 2015-11-05 NOTE — Addendum Note (Signed)
Addended by: Delice Bison E on: 11/05/2015 11:16 AM   Modules accepted: Orders

## 2015-11-05 NOTE — Telephone Encounter (Signed)
Pt aware that rx is filled and ready for pick up. States that she also has some vaginal itching but no break outs of herpes. I told pt that I would put in a referral to GYN.

## 2015-11-16 ENCOUNTER — Telehealth: Payer: Self-pay | Admitting: Family

## 2015-11-16 NOTE — Telephone Encounter (Signed)
Please advise 

## 2015-11-16 NOTE — Telephone Encounter (Signed)
Pt request to speak to the assistant today if possible, pt stated she came in to see [name] and he mention something about medication ADHD but she didn't see anything sent to [facility]. Pt also wondering if [name] can give her something to help her sleep---this on goes to [facility] if ok. Please give her a call back  [phone]

## 2015-11-18 NOTE — Telephone Encounter (Signed)
I do not recall discussing anything regarding ADHD from her last office visit and she will need formal testing and prescriptions to be written by psychiatry if appropriate. What has she tried for sleep so far?

## 2015-11-19 NOTE — Telephone Encounter (Signed)
I have not prescribed any medications for ADHD for her so I am not sure what medication she is referring to. As for sleep she can start with over the counter medications including benedryl, unisom, and melatonin.

## 2015-11-19 NOTE — Telephone Encounter (Signed)
Patient states her pharmacy has a script for ADHD.  Patient is upset that this is prescribed to her.  She does not want anything for ADHD.  She states she wants something for sleep.  I asked her what she has tried for sleep so far and she states she has not tried anything.

## 2015-11-20 ENCOUNTER — Encounter: Payer: Self-pay | Admitting: Obstetrics & Gynecology

## 2015-11-21 NOTE — Telephone Encounter (Signed)
Tried to call patient back.  Phone is ringing out of service.

## 2015-11-26 ENCOUNTER — Telehealth: Payer: Self-pay | Admitting: Family

## 2015-11-26 NOTE — Telephone Encounter (Signed)
We will discuss when she comes in for an office visit.

## 2015-11-26 NOTE — Telephone Encounter (Signed)
Please advise 

## 2015-11-26 NOTE — Telephone Encounter (Signed)
Pt called and wants to know if Catherine [name] can increase her  clonazePAM (KLONOPIN) 1 MG tablet [9638]       clonazePAM (KLONOPIN) 1 MG tablet [id number]

## 2015-11-28 ENCOUNTER — Ambulatory Visit: Payer: Self-pay | Admitting: Family

## 2015-11-28 DIAGNOSIS — Z0289 Encounter for other administrative examinations: Secondary | ICD-10-CM

## 2015-12-11 NOTE — Telephone Encounter (Signed)
Error

## 2015-12-11 NOTE — Telephone Encounter (Signed)
In Error

## 2015-12-14 ENCOUNTER — Other Ambulatory Visit (HOSPITAL_COMMUNITY)
Admission: RE | Admit: 2015-12-14 | Discharge: 2015-12-14 | Disposition: A | Discharge status: Home / Self Care | Payer: Medicare Other | Source: Ambulatory Visit | Attending: Obstetrics & Gynecology | Admitting: Obstetrics & Gynecology

## 2015-12-14 ENCOUNTER — Ambulatory Visit (INDEPENDENT_AMBULATORY_CARE_PROVIDER_SITE_OTHER): Payer: Medicare Other | Admitting: Obstetrics & Gynecology

## 2015-12-14 ENCOUNTER — Encounter: Payer: Self-pay | Admitting: Obstetrics & Gynecology

## 2015-12-14 VITALS — BP 150/84 | HR 64 | Temp 98.5°F | Ht 61.0 in | Wt 197.1 lb

## 2015-12-14 DIAGNOSIS — E669 Obesity, unspecified: Secondary | ICD-10-CM | POA: Insufficient documentation

## 2015-12-14 DIAGNOSIS — Z113 Encounter for screening for infections with a predominantly sexual mode of transmission: Secondary | ICD-10-CM

## 2015-12-14 DIAGNOSIS — Z1151 Encounter for screening for human papillomavirus (HPV): Secondary | ICD-10-CM | POA: Insufficient documentation

## 2015-12-14 DIAGNOSIS — Z124 Encounter for screening for malignant neoplasm of cervix: Secondary | ICD-10-CM

## 2015-12-14 DIAGNOSIS — Z01419 Encounter for gynecological examination (general) (routine) without abnormal findings: Secondary | ICD-10-CM | POA: Insufficient documentation

## 2015-12-14 NOTE — Addendum Note (Signed)
Addended by: Michel Harrow on: 12/14/2015 11:34 AM   Modules accepted: Orders

## 2015-12-14 NOTE — Progress Notes (Signed)
Subjective:    Catherine [name] is a 59 y.o. D AA P2 (13 and 71 yo kids, 56 grands)  female who presents for an annual exam. The patient has no complaints today. She is requesting some female condoms.  The patient is not currently sexually active. GYN screening history: last pap: was normal. The patient wears seatbelts: yes. The patient participates in regular exercise: no. Has the patient ever been transfused or tattooed?: no. The patient reports that there is not domestic violence in her life.   Menstrual History: OB History    No data available      Menarche age: 66  No LMP recorded. Patient is postmenopausal.    The following portions of the patient's history were reviewed and updated as appropriate: allergies, current medications, past family history, past medical history, past social history, past surgical history and problem list.  Review of Systems Pertinent items noted in HPI and remainder of comprehensive ROS otherwise negative.  She has had a flu vaccine this season. She lives with her 72 yo mom. Her mammogram is UTD and normal. Declines a colonoscopy. Objective:    BP 150/84 mmHg  Pulse 64  Temp(Src) 98.5 F (36.9 C) (Oral)  Ht 5\' 1"  (1.549 m)  Wt 197 lb 1.6 oz (89.404 kg)  BMI 37.26 kg/m2  General Appearance:    Alert, cooperative, no distress, appears stated age  Head:    Normocephalic, without obvious abnormality, atraumatic  Eyes:    PERRL, conjunctiva/corneas clear, EOM's intact, fundi    benign, both eyes  Ears:    Normal TM's and external ear canals, both ears  Nose:   Nares normal, septum midline, mucosa normal, no drainage    or sinus tenderness  Throat:   Lips, mucosa, and tongue normal; teeth and gums normal  Neck:   Supple, symmetrical, trachea midline, no adenopathy;    thyroid:  no enlargement/tenderness/nodules; no carotid   bruit or JVD  Back:     Symmetric, no curvature, ROM normal, no CVA tenderness  Lungs:     Clear to auscultation bilaterally,  respirations unlabored  Chest Wall:    No tenderness or deformity   Heart:    Regular rate and rhythm, S1 and S2 normal, no murmur, rub   or gallop  Breast Exam:    No tenderness, masses, or nipple abnormality  Abdomen:     Soft, non-tender, bowel sounds active all four quadrants,    no masses, no organomegaly, obese  Genitalia:    Normal female without lesion, discharge or tenderness, moderate atrophy, small normal cervix, NSSA, NT, no palpable masses     Extremities:   Extremities normal, atraumatic, no cyanosis or edema  Pulses:   2+ and symmetric all extremities  Skin:   Skin color, texture, turgor normal, no rashes or lesions  Lymph nodes:   Cervical, supraclavicular, and axillary nodes normal  Neurologic:   CNII-XII intact, normal strength, sensation and reflexes    throughout  .    Assessment:    Healthy female exam.    Plan:     Breast self exam technique reviewed and patient encouraged to perform self-exam monthly. Chlamydia specimen. GC specimen. Thin prep Pap smear. with cotesting Cervical cultures per her request

## 2015-12-18 ENCOUNTER — Telehealth: Payer: Self-pay

## 2015-12-18 LAB — GC/CHLAMYDIA PROBE AMP (~~LOC~~) NOT AT ARMC
Chlamydia: NEGATIVE
Neisseria Gonorrhea: NEGATIVE

## 2015-12-18 LAB — CYTOLOGY - PAP

## 2015-12-27 ENCOUNTER — Telehealth: Payer: Self-pay | Admitting: *Deleted

## 2015-12-27 DIAGNOSIS — F419 Anxiety disorder, unspecified: Secondary | ICD-10-CM

## 2015-12-27 DIAGNOSIS — F32A Depression, unspecified: Secondary | ICD-10-CM

## 2015-12-27 DIAGNOSIS — F329 Major depressive disorder, single episode, unspecified: Secondary | ICD-10-CM

## 2015-12-27 MED ORDER — CLONAZEPAM 1 MG PO TABS: 1.0000 mg | ORAL_TABLET | Freq: Two times a day (BID) | ORAL | Status: DC

## 2015-12-27 NOTE — Telephone Encounter (Signed)
Tried to call pt back to let her know [name] approve rx # on file has been disconnected. No other number to call but rx has been fax to [place]...[name]

## 2015-12-27 NOTE — Telephone Encounter (Signed)
Receive call pt is requesting refills on her clonazepam [facility]...[name]

## 2015-12-27 NOTE — Telephone Encounter (Signed)
Medication refilled

## 2016-01-18 ENCOUNTER — Telehealth: Payer: Self-pay | Admitting: Family

## 2016-01-18 NOTE — Telephone Encounter (Signed)
Pt called stated we need to start PA on butalbital so she can get her medication. Please help

## 2016-01-18 NOTE — Telephone Encounter (Signed)
Per pt, PA is not required. Pt is requesting a refill of Butalbital to [place]

## 2016-01-21 NOTE — Telephone Encounter (Signed)
Patient has called back in regards.  Please follow up.

## 2016-01-21 NOTE — Telephone Encounter (Signed)
Last refill was 10/16/15. Please advise on refill

## 2016-01-22 MED ORDER — BUTALBITAL-APAP-CAFF-COD 50-325-40-30 MG PO CAPS: 1.0000 | ORAL_CAPSULE | Freq: Two times a day (BID) | ORAL | Status: DC | PRN

## 2016-01-22 NOTE — Telephone Encounter (Signed)
Pt aware.

## 2016-01-22 NOTE — Telephone Encounter (Signed)
Pt never pick up rx. Voiding script...Catherine [name]

## 2016-01-22 NOTE — Telephone Encounter (Signed)
Medication refilled to be faxed.  

## 2016-02-12 ENCOUNTER — Telehealth: Payer: Self-pay | Admitting: Family

## 2016-02-12 DIAGNOSIS — F419 Anxiety disorder, unspecified: Secondary | ICD-10-CM

## 2016-02-12 DIAGNOSIS — F329 Major depressive disorder, single episode, unspecified: Secondary | ICD-10-CM

## 2016-02-12 DIAGNOSIS — F32A Depression, unspecified: Secondary | ICD-10-CM

## 2016-02-12 NOTE — Telephone Encounter (Signed)
Please advise 

## 2016-02-12 NOTE — Telephone Encounter (Signed)
Patient is calling regarding clonazePAM (KLONOPIN) 1 MG tablet [id number]  States that [facility] informed her that the cost of x60 and x90 are the same. She is asking that the script be changed to x90

## 2016-02-13 ENCOUNTER — Other Ambulatory Visit: Payer: Self-pay | Admitting: Family

## 2016-02-13 NOTE — Telephone Encounter (Signed)
Please inform patient that I am willing to do this, however I will not be filling the medication as frequently as this would increase the dose of her medication. This would mean that 90 pills will last her 45 days. I want to be sure she is aware of this before we submit it.

## 2016-02-14 ENCOUNTER — Telehealth: Payer: Self-pay

## 2016-02-14 DIAGNOSIS — F32A Depression, unspecified: Secondary | ICD-10-CM

## 2016-02-14 DIAGNOSIS — F329 Major depressive disorder, single episode, unspecified: Secondary | ICD-10-CM

## 2016-02-14 DIAGNOSIS — F419 Anxiety disorder, unspecified: Secondary | ICD-10-CM

## 2016-02-14 NOTE — Telephone Encounter (Signed)
Recd rx refill request for clonazepam tab from [facility]-----please advise, thanks

## 2016-02-14 NOTE — Telephone Encounter (Signed)
Pt aware.

## 2016-02-15 ENCOUNTER — Other Ambulatory Visit: Payer: Self-pay | Admitting: Family

## 2016-02-15 MED ORDER — CLONAZEPAM 1 MG PO TABS: 1.0000 mg | ORAL_TABLET | Freq: Two times a day (BID) | ORAL | Status: DC

## 2016-02-15 NOTE — Addendum Note (Signed)
Addended by: Mauricio Po D on: 02/15/2016 09:25 PM   Modules accepted: Orders

## 2016-02-15 NOTE — Telephone Encounter (Signed)
Medication refilled

## 2016-03-13 ENCOUNTER — Other Ambulatory Visit: Payer: Self-pay | Admitting: Family

## 2016-03-20 ENCOUNTER — Other Ambulatory Visit: Payer: Self-pay | Admitting: Family

## 2016-03-25 ENCOUNTER — Telehealth: Payer: Self-pay | Admitting: Family

## 2016-03-25 DIAGNOSIS — M71372 Other bursal cyst, left ankle and foot: Secondary | ICD-10-CM

## 2016-03-25 MED ORDER — ACETAMINOPHEN-CODEINE #3 300-30 MG PO TABS: 1.0000 | ORAL_TABLET | Freq: Four times a day (QID) | ORAL | Status: DC | PRN

## 2016-03-25 NOTE — Telephone Encounter (Signed)
Last refill for tylenol #3 was 11/02/15. No record of you ever filling tegratol not even listed on the history.

## 2016-03-25 NOTE — Telephone Encounter (Signed)
Tylenol 3 refilled. Carbamezapine has never been filled by me and will not be filled without further evaluation.

## 2016-03-25 NOTE — Telephone Encounter (Signed)
Pt called request refill for TYLENOL #3 and tegratol (pt does not remember the milligram or when [name] gave her last, please make sure). Please advise, pt is out and she has an appt on 03/27/16 (does she need to keep this appt?)

## 2016-03-26 NOTE — Telephone Encounter (Signed)
Tried to call pt and the number listed is not a working number. Tried 2 times.

## 2016-03-27 ENCOUNTER — Ambulatory Visit: Payer: Self-pay | Admitting: Family

## 2016-03-31 ENCOUNTER — Ambulatory Visit: Payer: Medicare Other | Admitting: Family

## 2016-04-03 ENCOUNTER — Encounter: Payer: Self-pay | Admitting: Family

## 2016-04-03 ENCOUNTER — Other Ambulatory Visit: Payer: Medicare Other

## 2016-04-03 ENCOUNTER — Ambulatory Visit (INDEPENDENT_AMBULATORY_CARE_PROVIDER_SITE_OTHER): Payer: Medicare Other | Admitting: Family

## 2016-04-03 VITALS — BP 128/84 | HR 57 | Temp 98.1°F | Resp 16 | Ht 61.0 in | Wt 198.0 lb

## 2016-04-03 DIAGNOSIS — M799 Soft tissue disorder, unspecified: Secondary | ICD-10-CM | POA: Insufficient documentation

## 2016-04-03 DIAGNOSIS — R300 Dysuria: Secondary | ICD-10-CM | POA: Diagnosis not present

## 2016-04-03 DIAGNOSIS — G43809 Other migraine, not intractable, without status migrainosus: Secondary | ICD-10-CM | POA: Diagnosis not present

## 2016-04-03 DIAGNOSIS — M7989 Other specified soft tissue disorders: Secondary | ICD-10-CM | POA: Diagnosis not present

## 2016-04-03 LAB — POCT URINALYSIS DIPSTICK
Bilirubin, UA: NEGATIVE
Blood, UA: NEGATIVE
Glucose, UA: NEGATIVE
Ketones, UA: NEGATIVE
Leukocytes, UA: NEGATIVE
Nitrite, UA: NEGATIVE
Protein, UA: NEGATIVE
Spec Grav, UA: 1.01
Urobilinogen, UA: NEGATIVE
pH, UA: 6

## 2016-04-03 MED ORDER — SUMATRIPTAN SUCCINATE 50 MG PO TABS: ORAL_TABLET | ORAL | Status: DC

## 2016-04-03 MED ORDER — SULFAMETHOXAZOLE-TRIMETHOPRIM 800-160 MG PO TABS: 1.0000 | ORAL_TABLET | Freq: Two times a day (BID) | ORAL | Status: DC

## 2016-04-03 MED ORDER — BUTALBITAL-APAP-CAFF-COD 50-325-40-30 MG PO CAPS: 1.0000 | ORAL_CAPSULE | Freq: Two times a day (BID) | ORAL | Status: DC | PRN

## 2016-04-03 NOTE — Assessment & Plan Note (Signed)
Soft tissue lesion present with patient requested referral to podiatry. Referral placed.

## 2016-04-03 NOTE — Progress Notes (Signed)
Subjective:    Patient ID: Catherine [name], female    DOB: 1956-03-03, 60 y.o.   MRN: [id number]  Chief Complaint  Patient presents with  . Medication Refill    fioricet and metranidozole    HPI:  Catherine [name] is a 60 y.o. female who  has a past medical history of Hypertension; CAD (coronary artery disease); Anxiety; and Obesity. and presents today for a follow up office visit.   1.) Migraines - Currently maintained on sumatriptan and Fioricet with codeine. Reports that her migraine headaches remain labile. Frequency of the headaches occur about 3 times per week. Headaches described as throbbing with mild sensistivity to light with no sensitivity to sound. Occasional nausea.   2.) Foot problem - Associated symptom of a cyst located on her foot has been there for several months. Described as burning and hurting located in her left foot. No modifying factors that make it better or worse. Would like referral to podiatry.   3.) Dysuria - Associated symptom of dysuria with occasional frequency has been going on for about 1 week. No fevers, hematuria or back pain.    Allergies  Allergen Reactions  . Ciprofloxacin Itching     Current Outpatient Prescriptions on File Prior to Visit  Medication Sig Dispense Refill  . acetaminophen-codeine (TYLENOL #3) 300-30 MG tablet Take 1 tablet by mouth every 6 (six) hours as needed for moderate pain or severe pain. 90 tablet 0  . albuterol (PROVENTIL HFA;VENTOLIN HFA) 108 (90 BASE) MCG/ACT inhaler Inhale 1-2 puffs into the lungs every 6 (six) hours as needed for wheezing or shortness of breath.    Marland Kitchen amLODipine (NORVASC) 10 MG tablet Take 10 mg by mouth every morning.    Marland Kitchen aspirin 81 MG chewable tablet Chew 81 mg by mouth daily.    . carvedilol (COREG) 6.25 MG tablet Take 6.25 mg by mouth 2 (two) times daily with a meal.    . Cholecalciferol (VITAMIN D PO) Take 1 tablet by mouth daily.    . clonazePAM (KLONOPIN) 1 MG tablet Take 1 tablet (1 mg total)  by mouth 2 (two) times daily. 90 tablet 1  . hydrocortisone 2.5 % lotion Apply topically 2 (two) times daily. 59 mL 0  . metroNIDAZOLE (FLAGYL) 500 MG tablet Take 1 tablet (500 mg total) by mouth 2 (two) times daily. 14 tablet 0  . omeprazole (PRILOSEC) 20 MG capsule Take 20 mg by mouth daily before breakfast.    . rosuvastatin (CRESTOR) 10 MG tablet Take 10 mg by mouth at bedtime.    . SYMBICORT 80-4.5 MCG/ACT inhaler Use 2 puffs two times daily 20.4 g 1  . TRINTELLIX 10 MG TABS Take 1 tablet by mouth  daily 90 tablet 0   No current facility-administered medications on file prior to visit.    Review of Systems  Constitutional: Negative for fever and chills.  Respiratory: Negative for chest tightness and shortness of breath.   Cardiovascular: Negative for chest pain, palpitations and leg swelling.  Genitourinary: Positive for dysuria, urgency and frequency. Negative for hematuria.  Neurological: Positive for headaches.      Objective:    BP 128/84 mmHg  Pulse 57  Temp(Src) 98.1 F (36.7 C) (Oral)  Resp 16  Ht 5\' 1"  (1.549 m)  Wt 198 lb (89.812 kg)  BMI 37.43 kg/m2  SpO2 96% Nursing note and vital signs reviewed.  Physical Exam  Constitutional: She is oriented to person, place, and time. She appears well-developed and well-nourished. No distress.  Cardiovascular: Normal rate, regular rhythm, normal heart sounds and intact distal pulses.   Pulmonary/Chest: Effort normal and breath sounds normal.  Abdominal: There is no tenderness. There is CVA tenderness.  Neurological: She is alert and oriented to person, place, and time.  Skin: Skin is warm and dry.  Psychiatric: She has a normal mood and affect. Her behavior is normal. Judgment and thought content normal.       Assessment & Plan:   Problem List Items Addressed This Visit      Cardiovascular and Mediastinum   Migraine - Primary    Symptoms and exam consistent with migraine headaches adequately managed with Imitrex and  Fioricet with codeine. Denies worse headache of her life. Continue current dosage of Imitrex and Fioricet with codeine. Follow-up if symptoms worsen or no longer controlled with current regimen.      Relevant Medications   SUMAtriptan (IMITREX) 50 MG tablet   butalbital-acetaminophen-caffeine (FIORICET WITH CODEINE) 50-325-40-30 MG capsule     Other   Dysuria    In office urinalysis negative for leukocytes, nitrites, and hematuria. Urine sent for culture. Start Bactrim. Encourage drink fluids. Follow-up if symptoms worsen or fail to improve prior to urine culture results.      Relevant Medications   sulfamethoxazole-trimethoprim (BACTRIM DS,SEPTRA DS) 800-160 MG tablet   Other Relevant Orders   POCT urinalysis dipstick (Completed)   Urine culture   Soft tissue lesion of foot    Soft tissue lesion present with patient requested referral to podiatry. Referral placed.       Relevant Orders   Ambulatory referral to Podiatry       I am having Catherine [name] start on sulfamethoxazole-trimethoprim. I am also having her maintain her aspirin, carvedilol, omeprazole, rosuvastatin, amLODipine, hydrocortisone, Cholecalciferol (VITAMIN D PO), albuterol, TRINTELLIX, metroNIDAZOLE, SYMBICORT, clonazePAM, acetaminophen-codeine, SUMAtriptan, and butalbital-acetaminophen-caffeine.   Meds ordered this encounter  Medications  . SUMAtriptan (IMITREX) 50 MG tablet    Sig: Take 1-2 tablets at the onset of headache and may repeat in 2 hours if headache persists or recurs. Max 4 tablets per day.    Dispense:  60 tablet    Refill:  0    Order Specific Question:  Supervising Provider    Answer:  Pricilla Holm A [id number]  . butalbital-acetaminophen-caffeine (FIORICET WITH CODEINE) 50-325-40-30 MG capsule    Sig: Take 1 capsule by mouth 2 (two) times daily as needed for headache.    Dispense:  60 capsule    Refill:  0    Order Specific Question:  Supervising Provider    Answer:  Pricilla Holm A  [id number]  . sulfamethoxazole-trimethoprim (BACTRIM DS,SEPTRA DS) 800-160 MG tablet    Sig: Take 1 tablet by mouth 2 (two) times daily.    Dispense:  6 tablet    Refill:  0    Order Specific Question:  Supervising Provider    Answer:  Pricilla Holm A [id number]     Follow-up: Return in about 3 months (around 07/03/2016).  Mauricio Po, FNP

## 2016-04-03 NOTE — Assessment & Plan Note (Signed)
Symptoms and exam consistent with migraine headaches adequately managed with Imitrex and Fioricet with codeine. Denies worse headache of her life. Continue current dosage of Imitrex and Fioricet with codeine. Follow-up if symptoms worsen or no longer controlled with current regimen.

## 2016-04-03 NOTE — Patient Instructions (Addendum)
Thank you for choosing [facility].  Summary/Instructions: Please continue to take her medications as prescribed.  Your urine was sent for culture and we will be in touch with the results.  A referral was sent to podiatry for your foot.  Your prescription(s) have been submitted to your pharmacy or been printed and provided for you. Please take as directed and contact our office if you believe you are having problem(s) with the medication(s) or have any questions.   If your symptoms worsen or fail to improve, please contact our office for further instruction, or in case of emergency go directly to the emergency room at the closest medical facility.

## 2016-04-03 NOTE — Assessment & Plan Note (Signed)
In office urinalysis negative for leukocytes, nitrites, and hematuria. Urine sent for culture. Start Bactrim. Encourage drink fluids. Follow-up if symptoms worsen or fail to improve prior to urine culture results.

## 2016-04-03 NOTE — Progress Notes (Signed)
Pre visit review using our clinic review tool, if applicable. No additional management support is needed unless otherwise documented below in the visit note. 

## 2016-04-06 LAB — URINE CULTURE: Colony Count: >=100000

## 2016-04-07 ENCOUNTER — Telehealth: Payer: Self-pay | Admitting: Family

## 2016-04-07 NOTE — Telephone Encounter (Signed)
LVM for pt to call back.

## 2016-04-07 NOTE — Telephone Encounter (Signed)
Please inform patient that her urine culture was positive for a urinary tract infection with the medication prescribed being appropriate for her infection.

## 2016-04-08 ENCOUNTER — Other Ambulatory Visit: Payer: Self-pay

## 2016-04-08 DIAGNOSIS — R300 Dysuria: Secondary | ICD-10-CM

## 2016-04-08 MED ORDER — SULFAMETHOXAZOLE-TRIMETHOPRIM 800-160 MG PO TABS: 1.0000 | ORAL_TABLET | Freq: Two times a day (BID) | ORAL | Status: DC

## 2016-04-08 NOTE — Telephone Encounter (Signed)
Called pharmacy and they verified that they did receive the rx the first time and they had already told the pt that. LVM letting pt know.

## 2016-04-08 NOTE — Telephone Encounter (Signed)
Mrs. Beames called back to follow up on call she had got. Adivse of below note but no med in med tab showing being sent. Please f/u with patient.

## 2016-04-08 NOTE — Telephone Encounter (Signed)
She was sent Bactrum on day of visit.

## 2016-04-08 NOTE — Telephone Encounter (Signed)
Patient called back in to advise that [facility] is telling her that they did not get the script sent in on 04/03/2016. Please call [facility] on [address] to give a verbal for bactrum

## 2016-04-08 NOTE — Telephone Encounter (Signed)
Which medication needed to be sent in

## 2016-04-13 ENCOUNTER — Other Ambulatory Visit: Payer: Self-pay | Admitting: Family

## 2016-04-14 ENCOUNTER — Telehealth: Payer: Self-pay | Admitting: Family

## 2016-04-14 DIAGNOSIS — F419 Anxiety disorder, unspecified: Secondary | ICD-10-CM

## 2016-04-14 DIAGNOSIS — F329 Major depressive disorder, single episode, unspecified: Secondary | ICD-10-CM

## 2016-04-14 DIAGNOSIS — F32A Depression, unspecified: Secondary | ICD-10-CM

## 2016-04-14 NOTE — Telephone Encounter (Signed)
Patient called stating she needs refills on all her medications and that she needs pain medication.

## 2016-04-15 NOTE — Telephone Encounter (Signed)
Pt called back in again about these refills

## 2016-04-16 MED ORDER — CARVEDILOL 6.25 MG PO TABS: 6.2500 mg | ORAL_TABLET | Freq: Two times a day (BID) | ORAL | Status: DC

## 2016-04-16 MED ORDER — CLONAZEPAM 1 MG PO TABS: 1.0000 mg | ORAL_TABLET | Freq: Two times a day (BID) | ORAL | Status: DC

## 2016-04-16 MED ORDER — VENLAFAXINE HCL ER 37.5 MG PO CP24: 37.5000 mg | ORAL_CAPSULE | Freq: Every day | ORAL | Status: DC

## 2016-04-16 MED ORDER — OMEPRAZOLE 20 MG PO CPDR: 20.0000 mg | DELAYED_RELEASE_CAPSULE | Freq: Every day | ORAL | Status: DC

## 2016-04-16 MED ORDER — AMLODIPINE BESYLATE 10 MG PO TABS: 10.0000 mg | ORAL_TABLET | Freq: Every morning | ORAL | Status: DC

## 2016-04-16 NOTE — Telephone Encounter (Signed)
Refills sent

## 2016-04-16 NOTE — Telephone Encounter (Signed)
Patient called to ask that we cancel the venlafaxin script and replace it with trintellix

## 2016-04-17 NOTE — Telephone Encounter (Signed)
Patient was ada met that Effexor works via phone. We will try it once again.

## 2016-04-21 ENCOUNTER — Encounter: Payer: Medicare Other | Admitting: Podiatry

## 2016-04-21 ENCOUNTER — Encounter: Payer: Self-pay | Admitting: Podiatry

## 2016-04-21 ENCOUNTER — Ambulatory Visit (INDEPENDENT_AMBULATORY_CARE_PROVIDER_SITE_OTHER): Payer: Medicare Other

## 2016-04-21 DIAGNOSIS — M79672 Pain in left foot: Secondary | ICD-10-CM | POA: Diagnosis not present

## 2016-04-21 MED ORDER — TRIAMCINOLONE ACETONIDE 10 MG/ML IJ SUSP
10.0000 mg | Freq: Once | INTRAMUSCULAR | Status: AC
  Administered 2016-04-21: 10 mg

## 2016-04-21 NOTE — Progress Notes (Deleted)
   Subjective:    Patient ID: Catherine [name], female    DOB: January 24, 1956, 60 y.o.   MRN: [id number]  HPI Chief Complaint  Patient presents with  . Callouses    Left foot; 2nd toe-top of toe; 5th toe-medial  . Toe Pain    Left foot; 2nd toe; numbness; pt stated, "Has numbness in toe"; x1-2 yrs      Review of Systems  Constitutional: Positive for diaphoresis.  HENT: Positive for hearing loss, sinus pressure and tinnitus.   Cardiovascular: Positive for palpitations.  Neurological: Positive for dizziness and headaches.  All other systems reviewed and are negative.      Objective:   Physical Exam        Assessment & Plan:

## 2016-04-21 NOTE — Progress Notes (Signed)
This encounter was created in error - please disregard.

## 2016-04-23 ENCOUNTER — Ambulatory Visit: Payer: Medicare Other | Admitting: Podiatry

## 2016-04-29 ENCOUNTER — Other Ambulatory Visit: Payer: Self-pay

## 2016-04-29 DIAGNOSIS — Z1231 Encounter for screening mammogram for malignant neoplasm of breast: Secondary | ICD-10-CM

## 2016-05-01 ENCOUNTER — Telehealth: Payer: Self-pay | Admitting: Family

## 2016-05-01 NOTE — Telephone Encounter (Signed)
Pt called stated she is not taking venlafaxine XR (EFFEXOR XR) 37.5 MG 24 hr capsule due to side effect : dizzy,nervouuse, jittery. Please call her back

## 2016-05-01 NOTE — Telephone Encounter (Signed)
Please advise, thanks.

## 2016-05-05 NOTE — Telephone Encounter (Signed)
Please stop taking Effexor and make an office appointment. No further medication changes will be made without an office visit. I am also going to recommend she establish with another provider because I do not believe that I can safely and adequately treat her.

## 2016-05-05 NOTE — Telephone Encounter (Signed)
Pt called back and wanted to know what advise was given. Pt given advise below. Explained that Catherine [name] feels like pt would be better suited under another providers care.  Pt did eventually state understanding to the advise and to the change in PCP.

## 2016-05-05 NOTE — Telephone Encounter (Signed)
Pt called to check up on this. Please help

## 2016-05-08 ENCOUNTER — Encounter: Payer: Self-pay | Admitting: Podiatry

## 2016-05-08 ENCOUNTER — Ambulatory Visit (INDEPENDENT_AMBULATORY_CARE_PROVIDER_SITE_OTHER): Payer: Medicare Other

## 2016-05-08 ENCOUNTER — Ambulatory Visit (INDEPENDENT_AMBULATORY_CARE_PROVIDER_SITE_OTHER): Payer: Medicare Other | Admitting: Podiatry

## 2016-05-08 VITALS — BP 151/78 | HR 69 | Resp 12

## 2016-05-08 DIAGNOSIS — M79672 Pain in left foot: Secondary | ICD-10-CM

## 2016-05-08 DIAGNOSIS — M7752 Other enthesopathy of left foot: Secondary | ICD-10-CM

## 2016-05-08 DIAGNOSIS — M779 Enthesopathy, unspecified: Secondary | ICD-10-CM

## 2016-05-08 MED ORDER — HYDROCODONE-ACETAMINOPHEN 5-325 MG PO TABS: 1.0000 | ORAL_TABLET | Freq: Four times a day (QID) | ORAL | Status: DC | PRN

## 2016-05-08 MED ORDER — TRIAMCINOLONE ACETONIDE 10 MG/ML IJ SUSP
10.0000 mg | Freq: Once | INTRAMUSCULAR | Status: AC
  Administered 2016-05-08: 10 mg

## 2016-05-08 NOTE — Progress Notes (Signed)
   Subjective:    Patient ID: Catherine [name], female    DOB: [birth year], 60 y.o.   MRN: [id number]  HPI Chief Complaint  Patient presents with  . Foot Pain    ''LT TOP OF THE FOOT HAVE A KNOT/PAINFUL.''    PT STATED LT TOP OF THE FOOT HAVE A KNOT/PAINFUL FOR ABOUT 6 MONTHS. FOOT IS GETTING WORSE ESPECIALLY WHEN WEARING SHOES OR PRESSURE. TRIED NO TREATMENT.  Review of Systems  Cardiovascular: Positive for leg swelling.  Genitourinary: Positive for frequency.  Musculoskeletal: Positive for joint swelling.       Objective:   Physical Exam        Assessment & Plan:

## 2016-05-08 NOTE — Progress Notes (Signed)
Subjective:     Patient ID: Catherine [name], female   DOB: 03-07-1956, 60 y.o.   MRN: [id number]  HPI patient presents stating I'm having a lot of pain on top of my left foot and it makes it hard to wear shoe gear comfortably and it's been present for at least 3 or 4 months   Review of Systems  All other systems reviewed and are negative.      Objective:   Physical Exam  Constitutional: She is oriented to person, place, and time.  Cardiovascular: Intact distal pulses.   Musculoskeletal: Normal range of motion.  Neurological: She is oriented to person, place, and time.  Skin: Skin is warm.  Nursing note and vitals reviewed.  neurovascular status found to be intact muscle strength adequate range of motion within normal limits with patient found to have inflammation and pain on top of the left foot in the midtarsal joint. There is fluid buildup and is very tender when pressed and there is been a previous history of this problem. Patient has good digital perfusion is well oriented 3     Assessment:     Inflammatory tendinitis of the left dorsal foot with possibility for arthritic condition    Plan:     H&P and x-rays reviewed with patient. Administered injection of the dorsal tissue complex 3 mg Kenalog 5 mg Xylocaine placed on heat and ice therapy wider-type shoes and reappoint if symptoms persist  X-ray report indicated some roughness of the bone with probable spur formation noted dorsal left foot

## 2016-05-11 ENCOUNTER — Other Ambulatory Visit: Payer: Self-pay | Admitting: Family

## 2016-05-12 ENCOUNTER — Ambulatory Visit
Admission: RE | Admit: 2016-05-12 | Discharge: 2016-05-12 | Disposition: A | Discharge status: Home / Self Care | Payer: Medicare Other | Source: Ambulatory Visit

## 2016-05-12 DIAGNOSIS — Z1231 Encounter for screening mammogram for malignant neoplasm of breast: Secondary | ICD-10-CM

## 2016-05-15 ENCOUNTER — Ambulatory Visit (INDEPENDENT_AMBULATORY_CARE_PROVIDER_SITE_OTHER): Payer: Medicare Other | Admitting: Internal Medicine

## 2016-05-15 ENCOUNTER — Encounter: Payer: Self-pay | Admitting: Internal Medicine

## 2016-05-15 VITALS — BP 132/70 | HR 59 | Temp 97.8°F | Resp 14 | Ht 61.0 in | Wt 199.8 lb

## 2016-05-15 DIAGNOSIS — F32A Depression, unspecified: Secondary | ICD-10-CM

## 2016-05-15 DIAGNOSIS — F418 Other specified anxiety disorders: Secondary | ICD-10-CM

## 2016-05-15 DIAGNOSIS — F329 Major depressive disorder, single episode, unspecified: Secondary | ICD-10-CM

## 2016-05-15 DIAGNOSIS — F419 Anxiety disorder, unspecified: Secondary | ICD-10-CM

## 2016-05-15 MED ORDER — DULOXETINE HCL 30 MG PO CPEP: 30.0000 mg | ORAL_CAPSULE | Freq: Every day | ORAL | Status: DC

## 2016-05-15 NOTE — Progress Notes (Signed)
Pre visit review using our clinic review tool, if applicable. No additional management support is needed unless otherwise documented below in the visit note. 

## 2016-05-15 NOTE — Assessment & Plan Note (Signed)
Since she has been stable on only clonazepam since she adamantly denies that she ever took trintellix in the past. Will not increase her clonazepam but did discuss with her in detail that this is not a good medicine for anxiety. She takes it twice a day whether she needs it or not and generally she states if she misses that she gets anxious for it but cannot state any panic or anxiety attacks. She did not recognize cymbalta and will start as she also appears to have some complex pain issues and this may help with those as well. Recommended psych consult as she does seem to have problems with many medicines but she declines.

## 2016-05-15 NOTE — Progress Notes (Signed)
   Subjective:    Patient ID: Catherine [name], female    DOB: 11/29/1956, 60 y.o.   MRN: [id number]  HPI The patient is a 60 YO female coming in for side effects with effexor. She previously was prescribed trintellix previously which is documented to be working. She does state today that she was never taking that medicine. She just got it and threw it away.  She was having dizziness and balance problems on the effexor and took it for about 1 week. She then stopped taking it 1-2 weeks ago now. She is feeling mostly improved but not 100% better. She initially states that she has never tried anything but clonazepam for her anxiety but as I name off medications for anxiety she states that she has tried them all and did not like them. Then I asked if she had ever been to an anxiety specialist and she states that she was not going to a psychiatrist. She has been to one in the past. She did not say anything about it but when asked it if was a bad experience she says yes. She then asks to increase clonazepam.   Review of Systems  Constitutional: Negative.   Respiratory: Negative.   Cardiovascular: Negative.   Gastrointestinal: Negative.   Psychiatric/Behavioral: Positive for dysphoric mood, decreased concentration and agitation. Negative for sleep disturbance. The patient is not nervous/anxious.       Objective:   Physical Exam  Constitutional: She appears well-developed and well-nourished.  HENT:  Head: Normocephalic and atraumatic.  Eyes: EOM are normal.  Cardiovascular: Normal rate and regular rhythm.   Pulmonary/Chest: Effort normal and breath sounds normal.  Abdominal: Soft.  Neurological: She is alert. Coordination normal.  Skin: Skin is warm and dry.  Psychiatric:  She contradicts herself several times during our conversation and is withdrawn and not engaged.    Filed Vitals:   05/15/16 0905  BP: 132/70  Pulse: 59  Temp: 97.8 F (36.6 C)  TempSrc: Oral  Resp: 14  Height: 5\' 1"   (1.549 m)  Weight: 199 lb 12.8 oz (90.629 kg)  SpO2: 97%      Assessment & Plan:  Visit time 25 minutes: greater than 50% of which was spent in face to face with the patient in counseling and coordination of care. Counseling on her mental illness and the appropriate way to use clonazepam as well as the benefit of control of her mental illness so she can feel good during the day and not worried all the time.

## 2016-05-15 NOTE — Patient Instructions (Addendum)
We have sent in a medicine called cymbalta (also called duloxetine) which can help with anxiety and can also help with pain for some people.   Take 1 pill daily for the first 3-4 weeks then call the office to let us know how you are doing.   If you have any side effects call the office for advice and stop taking the medicine.   You can stop at the front desk or call to ask about switching doctors to see if there are any options that you like.   I do strongly recommend to consider seeing a psychiatrist again as they are not all bad and they are the experts for anxiety like yours that is difficult to help.

## 2016-05-20 ENCOUNTER — Telehealth: Payer: Self-pay | Admitting: Family

## 2016-05-20 ENCOUNTER — Encounter: Payer: Self-pay | Admitting: Gastroenterology

## 2016-05-20 NOTE — Telephone Encounter (Signed)
Patient called to advise that [facility] is sending in a formulary exception request for fioricet.

## 2016-05-29 ENCOUNTER — Other Ambulatory Visit (INDEPENDENT_AMBULATORY_CARE_PROVIDER_SITE_OTHER): Payer: Medicare Other

## 2016-05-29 ENCOUNTER — Encounter: Payer: Self-pay | Admitting: Family

## 2016-05-29 ENCOUNTER — Ambulatory Visit (INDEPENDENT_AMBULATORY_CARE_PROVIDER_SITE_OTHER): Payer: Medicare Other | Admitting: Family

## 2016-05-29 ENCOUNTER — Telehealth: Payer: Self-pay | Admitting: Family

## 2016-05-29 VITALS — BP 120/80 | HR 63 | Temp 97.9°F | Resp 14 | Ht 61.0 in | Wt 196.0 lb

## 2016-05-29 DIAGNOSIS — R809 Proteinuria, unspecified: Secondary | ICD-10-CM

## 2016-05-29 DIAGNOSIS — F419 Anxiety disorder, unspecified: Secondary | ICD-10-CM

## 2016-05-29 DIAGNOSIS — F418 Other specified anxiety disorders: Secondary | ICD-10-CM | POA: Diagnosis not present

## 2016-05-29 DIAGNOSIS — F32A Depression, unspecified: Secondary | ICD-10-CM

## 2016-05-29 DIAGNOSIS — F329 Major depressive disorder, single episode, unspecified: Secondary | ICD-10-CM

## 2016-05-29 LAB — RENAL FUNCTION PANEL
Albumin: 4.3 g/dL (ref 3.5–5.2)
BUN: 6 mg/dL (ref 6–23)
CO2: 29 mEq/L (ref 19–32)
Calcium: 9.6 mg/dL (ref 8.4–10.5)
Chloride: 105 mEq/L (ref 96–112)
Creatinine, Ser: 0.79 mg/dL (ref 0.40–1.20)
GFR: 94.97 mL/min (ref >60.00)
Glucose, Bld: 98 mg/dL (ref 70–99)
Phosphorus: 2.6 mg/dL (ref 2.3–4.6)
Potassium: 3.1 mEq/L — ABNORMAL LOW (ref 3.5–5.1)
Sodium: 140 mEq/L (ref 135–145)

## 2016-05-29 LAB — URINALYSIS, ROUTINE W REFLEX MICROSCOPIC
Bilirubin Urine: NEGATIVE
Hgb urine dipstick: NEGATIVE
Ketones, ur: NEGATIVE
Nitrite: NEGATIVE
Specific Gravity, Urine: 1.01 (ref 1.000–1.030)
Total Protein, Urine: NEGATIVE
Urine Glucose: NEGATIVE
Urobilinogen, UA: 0.2 (ref 0.0–1.0)
pH: 7 (ref 5.0–8.0)

## 2016-05-29 NOTE — Telephone Encounter (Signed)
Please inform patient that her urinalysis and kidney function are normal with no evidence of protein in her urine. Therefore no changes are currently indicated at this time.

## 2016-05-29 NOTE — Progress Notes (Signed)
Subjective:    Patient ID: Catherine [name], female    DOB: 1956-06-12, 61 y.o.   MRN: [id number]  Chief Complaint  Patient presents with  . Proteinuria    home health nurse checked urine and saw that she has 2+ protein, was concerned and told her to see PCP    HPI:  Catherine [name] is a 60 y.o. female who  has a past medical history of Hypertension; CAD (coronary artery disease); Anxiety; and Obesity. and presents today for a follow up office visit.    1.) Protein in urine - Recently completed a home health assessment and was noted to have 2+ protein in her urine on a urine dipstick. Instructed to follow up with primary care for further assessment with the recommendations for further evaluation of kidney function.  Allergies  Allergen Reactions  . Ciprofloxacin Itching  . Codeine Nausea Only  . Other Nausea Only    Darvocet     Current Outpatient Prescriptions on File Prior to Visit  Medication Sig Dispense Refill  . acetaminophen-codeine (TYLENOL #3) 300-30 MG tablet Take 1 tablet by mouth every 6 (six) hours as needed for moderate pain or severe pain. 90 tablet 0  . albuterol (PROVENTIL HFA;VENTOLIN HFA) 108 (90 BASE) MCG/ACT inhaler Inhale 1-2 puffs into the lungs every 6 (six) hours as needed for wheezing or shortness of breath.    Marland Kitchen amLODipine (NORVASC) 10 MG tablet Take 1 tablet (10 mg total) by mouth every morning. 90 tablet 1  . aspirin 81 MG chewable tablet Chew 81 mg by mouth daily.    . butalbital-acetaminophen-caffeine (FIORICET WITH CODEINE) 50-325-40-30 MG capsule Take 1 capsule by mouth 2 (two) times daily as needed for headache. 60 capsule 0  . carvedilol (COREG) 6.25 MG tablet Take 1 tablet (6.25 mg total) by mouth 2 (two) times daily with a meal. 90 tablet 1  . Cholecalciferol (VITAMIN D PO) Take 1 tablet by mouth daily.    . clonazePAM (KLONOPIN) 1 MG tablet Take 1 tablet (1 mg total) by mouth 2 (two) times daily. 90 tablet 1  . DULoxetine (CYMBALTA) 30 MG  capsule Take 1 capsule (30 mg total) by mouth daily. 30 capsule 1  . omeprazole (PRILOSEC) 20 MG capsule Take 1 capsule (20 mg total) by mouth daily before breakfast. 90 capsule 1  . rosuvastatin (CRESTOR) 10 MG tablet Take 10 mg by mouth at bedtime.    . SYMBICORT 80-4.5 MCG/ACT inhaler Use 2 puffs two times daily 20.4 g 3   No current facility-administered medications on file prior to visit.    Review of Systems  Constitutional: Negative for fever and chills.  Genitourinary: Negative for dysuria, urgency, frequency, flank pain, decreased urine volume and dyspareunia.      Objective:    BP 120/80 mmHg  Pulse 63  Temp(Src) 97.9 F (36.6 C) (Oral)  Resp 14  Ht 5\' 1"  (1.549 m)  Wt 196 lb (88.905 kg)  BMI 37.05 kg/m2  SpO2 96% Nursing note and vital signs reviewed.  Physical Exam  Constitutional: She is oriented to person, place, and time. She appears well-developed and well-nourished. No distress.  Cardiovascular: Normal rate, regular rhythm, normal heart sounds and intact distal pulses.   Pulmonary/Chest: Effort normal and breath sounds normal.  Neurological: She is alert and oriented to person, place, and time.  Skin: Skin is warm and dry.  Psychiatric: She has a normal mood and affect. Her behavior is normal. Judgment and thought content normal.  Assessment & Plan:   Problem List Items Addressed This Visit      Other   Anxiety and depression   Proteinuria - Primary    2+ protein noted in urine during home screening. No symptoms of infection or kidney disease. Obtain UA and renal panel. Consider change of blood pressure medication to include ACE/ARB. Follow up pending blood work.       Relevant Orders   Urinalysis, Routine w reflex microscopic (Completed)   Renal Function Panel (Completed)      I have discontinued Ms. Stopka's hydrocortisone and SUMAtriptan. I am also having her maintain her aspirin, rosuvastatin, Cholecalciferol (VITAMIN D PO), albuterol,  acetaminophen-codeine, butalbital-acetaminophen-caffeine, carvedilol, amLODipine, omeprazole, clonazePAM, SYMBICORT, and DULoxetine.    Follow-up: Return if symptoms worsen or fail to improve.   Mauricio Po, FNP

## 2016-05-29 NOTE — Progress Notes (Signed)
Pre visit review using our clinic review tool, if applicable. No additional management support is needed unless otherwise documented below in the visit note. 

## 2016-05-29 NOTE — Assessment & Plan Note (Signed)
2+ protein noted in urine during home screening. No symptoms of infection or kidney disease. Obtain UA and renal panel. Consider change of blood pressure medication to include ACE/ARB. Follow up pending blood work.

## 2016-05-29 NOTE — Patient Instructions (Addendum)
Thank you for choosing [facility].  Summary/Instructions:  Please continue to take your medication as prescribed.  We will send in a refill of your clonazepam.   Further treatment pending your blood work results.   Your prescription(s) have been submitted to your pharmacy or been printed and provided for you. Please take as directed and contact our office if you believe you are having problem(s) with the medication(s) or have any questions.  Please stop by the lab on the basement level of the building for your blood work. Your results will be released to [facility] (or called to you) after review, usually within 72 hours after test completion. If any changes need to be made, you will be notified at that same time.  If your symptoms worsen or fail to improve, please contact our office for further instruction, or in case of emergency go directly to the emergency room at the closest medical facility.

## 2016-05-30 ENCOUNTER — Other Ambulatory Visit: Payer: Self-pay | Admitting: Internal Medicine

## 2016-05-30 NOTE — Telephone Encounter (Signed)
Pt aware of results 

## 2016-06-12 ENCOUNTER — Telehealth: Payer: Self-pay | Admitting: Family

## 2016-06-12 DIAGNOSIS — F32A Depression, unspecified: Secondary | ICD-10-CM

## 2016-06-12 DIAGNOSIS — F419 Anxiety disorder, unspecified: Secondary | ICD-10-CM

## 2016-06-12 DIAGNOSIS — F329 Major depressive disorder, single episode, unspecified: Secondary | ICD-10-CM

## 2016-06-12 MED ORDER — CLONAZEPAM 1 MG PO TABS: 1.0000 mg | ORAL_TABLET | Freq: Two times a day (BID) | ORAL | Status: DC

## 2016-06-12 NOTE — Telephone Encounter (Signed)
Patient is requesting refill on klonopin °

## 2016-06-12 NOTE — Telephone Encounter (Signed)
Last refill was 04/16/16

## 2016-06-12 NOTE — Telephone Encounter (Signed)
Medication printed to be faxed.  

## 2016-06-12 NOTE — Telephone Encounter (Signed)
Ok with me 

## 2016-06-12 NOTE — Telephone Encounter (Signed)
I am not currently accepting inter-office transfers.

## 2016-06-12 NOTE — Telephone Encounter (Signed)
Pt is requesting to transfer from [facility] to [facility] is this ok?

## 2016-06-13 NOTE — Telephone Encounter (Signed)
Rx faxed

## 2016-06-26 ENCOUNTER — Other Ambulatory Visit: Payer: Self-pay | Admitting: Family

## 2016-07-01 ENCOUNTER — Other Ambulatory Visit: Payer: Self-pay | Admitting: Family

## 2016-07-03 ENCOUNTER — Ambulatory Visit (AMBULATORY_SURGERY_CENTER): Payer: Self-pay

## 2016-07-03 VITALS — Ht 61.5 in | Wt 198.0 lb

## 2016-07-03 DIAGNOSIS — Z1211 Encounter for screening for malignant neoplasm of colon: Secondary | ICD-10-CM

## 2016-07-03 MED ORDER — SUPREP BOWEL PREP KIT 17.5-3.13-1.6 GM/177ML PO SOLN: 1.0000 | Freq: Once | ORAL | Status: DC

## 2016-07-03 NOTE — Progress Notes (Signed)
No allergies to eggs or soy No past problems with anesthesia No diet meds No home oxygen  No internet 

## 2016-07-17 ENCOUNTER — Encounter: Payer: Self-pay | Admitting: Gastroenterology

## 2016-07-17 ENCOUNTER — Ambulatory Visit (AMBULATORY_SURGERY_CENTER): Payer: Medicare Other | Admitting: Gastroenterology

## 2016-07-17 VITALS — BP 125/77 | HR 54 | Temp 98.6°F | Resp 15 | Ht 61.5 in | Wt 198.0 lb

## 2016-07-17 DIAGNOSIS — Z1211 Encounter for screening for malignant neoplasm of colon: Secondary | ICD-10-CM

## 2016-07-17 DIAGNOSIS — K635 Polyp of colon: Secondary | ICD-10-CM

## 2016-07-17 DIAGNOSIS — D123 Benign neoplasm of transverse colon: Secondary | ICD-10-CM

## 2016-07-17 NOTE — Progress Notes (Signed)
A and O x3. Report to RN. Tolerated MAC anesthesia well.   Called into procedure room to assist doctor with pat. Ernestine Conrad, RN

## 2016-07-17 NOTE — Patient Instructions (Signed)
YOU HAD AN ENDOSCOPIC PROCEDURE TODAY AT THE [facility]:   Refer to the procedure report that was given to you for any specific questions about what was found during the examination.  If the procedure report does not answer your questions, please call your gastroenterologist to clarify.  If you requested that your care partner not be given the details of your procedure findings, then the procedure report has been included in a sealed envelope for you to review at your convenience later.  YOU SHOULD EXPECT: Some feelings of bloating in the abdomen. Passage of more gas than usual.  Walking can help get rid of the air that was put into your GI tract during the procedure and reduce the bloating. If you had a lower endoscopy (such as a colonoscopy or flexible sigmoidoscopy) you may notice spotting of blood in your stool or on the toilet paper. If you underwent a bowel prep for your procedure, you may not have a normal bowel movement for a few days.  Please Note:  You might notice some irritation and congestion in your nose or some drainage.  This is from the oxygen used during your procedure.  There is no need for concern and it should clear up in a day or so.  SYMPTOMS TO REPORT IMMEDIATELY:   Following lower endoscopy (colonoscopy or flexible sigmoidoscopy):  Excessive amounts of blood in the stool  Significant tenderness or worsening of abdominal pains  Swelling of the abdomen that is new, acute  Fever of 100F or higher   For urgent or emergent issues, a gastroenterologist can be reached at any hour by calling [phone].   DIET: Your first meal following the procedure should be a small meal and then it is ok to progress to your normal diet. Heavy or fried foods are harder to digest and may make you feel nauseous or bloated.  Likewise, meals heavy in dairy and vegetables can increase bloating.  Drink plenty of fluids but you should avoid alcoholic beverages for 24  hours.  ACTIVITY:  You should plan to take it easy for the rest of today and you should NOT DRIVE or use heavy machinery until tomorrow (because of the sedation medicines used during the test).    FOLLOW UP: Our staff will call the number listed on your records the next business day following your procedure to check on you and address any questions or concerns that you may have regarding the information given to you following your procedure. If we do not reach you, we will leave a message.  However, if you are feeling well and you are not experiencing any problems, there is no need to return our call.  We will assume that you have returned to your regular daily activities without incident.  If any biopsies were taken you will be contacted by phone or by letter within the next 1-3 weeks.  Please call us at [phone] if you have not heard about the biopsies in 3 weeks.    SIGNATURES/CONFIDENTIALITY: You and/or your care partner have signed paperwork which will be entered into your electronic medical record.  These signatures attest to the fact that that the information above on your After Visit Summary has been reviewed and is understood.  Full responsibility of the confidentiality of this discharge information lies with you and/or your care-partner. 

## 2016-07-17 NOTE — Progress Notes (Signed)
Patient given 5 preparation H with lidocaine wipes. Patient given all paperwork in envelope.

## 2016-07-17 NOTE — Op Note (Signed)
[facility] Patient Name: Catherine [name] Procedure Date: 07/17/2016 10:42 AM MRN: [id number] Endoscopist: Mauri Pole , MD Age: 60 Referring MD:  Date of Birth: 1956/06/14 Gender: Female Account #: [id number] Procedure:                Colonoscopy Indications:              Screening for colorectal malignant neoplasm, This                            is the patient's first colonoscopy Medicines:                Monitored Anesthesia Care Procedure:                Pre-Anesthesia Assessment:                           - Prior to the procedure, a History and Physical                            was performed, and patient medications and                            allergies were reviewed. The patient's tolerance of                            previous anesthesia was also reviewed. The risks                            and benefits of the procedure and the sedation                            options and risks were discussed with the patient.                            All questions were answered, and informed consent                            was obtained. Prior Anticoagulants: The patient has                            taken no previous anticoagulant or antiplatelet                            agents. ASA Grade Assessment: II - A patient with                            mild systemic disease. After reviewing the risks                            and benefits, the patient was deemed in                            satisfactory condition to undergo the procedure.  After obtaining informed consent, the colonoscope                            was passed under direct vision. Throughout the                            procedure, the patient's blood pressure, pulse, and                            oxygen saturations were monitored continuously. The                            Model CF-HQ190L [id number]) scope was introduced                            through the anus  and advanced to the the terminal                            ileum, with identification of the appendiceal                            orifice and IC valve. The colonoscopy was performed                            without difficulty. The patient tolerated the                            procedure well. The quality of the bowel                            preparation was adequate. The ileocecal valve,                            appendiceal orifice, and rectum were photographed. Scope In: 10:51:06 AM Scope Out: 11:07:29 AM Scope Withdrawal Time: 0 hours 10 minutes 44 seconds  Total Procedure Duration: 0 hours 16 minutes 23 seconds  Findings:                 The perianal and digital rectal examinations were                            normal.                           A 9 mm polyp was found in the transverse colon. The                            polyp was pedunculated. The polyp was removed with                            a hot snare. Resection and retrieval were complete.                           Multiple small-mouthed diverticula were found in  the sigmoid colon and descending colon.                           The exam was otherwise without abnormality. Complications:            No immediate complications. Estimated Blood Loss:     Estimated blood loss: none. Impression:               - One 9 mm polyp in the transverse colon, removed                            with a hot snare. Resected and retrieved.                           - Diverticulosis in the sigmoid colon and in the                            descending colon.                           - The examination was otherwise normal. Recommendation:           - Patient has a contact number available for                            emergencies. The signs and symptoms of potential                            delayed complications were discussed with the                            patient. Return to normal activities  tomorrow.                            Written discharge instructions were provided to the                            patient.                           - Resume previous diet.                           - Continue present medications.                           - Await pathology results.                           - No ibuprofen, naproxen, or other non-steroidal                            anti-inflammatory drugs.                           - Repeat colonoscopy in 5 years for surveillance. Mauri Pole, MD 07/17/2016 11:12:53 AM This report has been signed electronically.

## 2016-07-18 ENCOUNTER — Telehealth: Payer: Self-pay | Admitting: *Deleted

## 2016-07-18 ENCOUNTER — Ambulatory Visit: Payer: Medicare Other | Admitting: Internal Medicine

## 2016-07-18 NOTE — Telephone Encounter (Signed)
  Follow up Call-  Call back number 07/17/2016  Post procedure Call Back phone  # [phone]  Permission to leave phone message Yes  Some recent data might be hidden     Patient questions:  Do you have a fever, pain , or abdominal swelling? No. Pain Score  0 *  Have you tolerated food without any problems? Yes.    Have you been able to return to your normal activities? Yes.    Do you have any questions about your discharge instructions: Diet   No Medications  No Follow up visit  No  Do you have questions or concerns about your Care? Yes.    Actions: * If pain score is 4 or above: No action needed, pain <4.  Pt states that she has frequent left shoulder pain, which was worse yesterday after laying on that left side for the procedure.  It is "better today."  I encouraged her to take Tylenol as needed for this.

## 2016-07-23 ENCOUNTER — Telehealth: Payer: Self-pay

## 2016-07-23 ENCOUNTER — Encounter: Payer: Self-pay | Admitting: Gastroenterology

## 2016-07-23 DIAGNOSIS — F419 Anxiety disorder, unspecified: Secondary | ICD-10-CM

## 2016-07-23 DIAGNOSIS — F329 Major depressive disorder, single episode, unspecified: Secondary | ICD-10-CM

## 2016-07-23 DIAGNOSIS — F32A Depression, unspecified: Secondary | ICD-10-CM

## 2016-07-23 MED ORDER — CLONAZEPAM 1 MG PO TABS: 1.0000 mg | ORAL_TABLET | Freq: Two times a day (BID) | ORAL | 1 refills | Status: DC

## 2016-07-23 NOTE — Telephone Encounter (Signed)
Requesting for refill of klonopin last refill was 05/2216

## 2016-07-23 NOTE — Telephone Encounter (Signed)
Medication printed to be faxed.  

## 2016-07-28 ENCOUNTER — Telehealth: Payer: Self-pay | Admitting: Gastroenterology

## 2016-07-28 NOTE — Telephone Encounter (Signed)
Advised the results have not been reviewed by her GI yet, but there is no report of cancer. This makes the patient happy and she thanks me for the phone call.

## 2016-08-01 ENCOUNTER — Telehealth: Payer: Self-pay | Admitting: Emergency Medicine

## 2016-08-01 DIAGNOSIS — F419 Anxiety disorder, unspecified: Secondary | ICD-10-CM

## 2016-08-01 DIAGNOSIS — F329 Major depressive disorder, single episode, unspecified: Secondary | ICD-10-CM

## 2016-08-01 DIAGNOSIS — F32A Depression, unspecified: Secondary | ICD-10-CM

## 2016-08-01 NOTE — Telephone Encounter (Signed)
Patient called and needs a prescription refill on clonazePAM (KLONOPIN) 1 MG tablet. Pharmacy is [facility]. Please advise thanks.

## 2016-08-04 MED ORDER — CLONAZEPAM 1 MG PO TABS: 1.0000 mg | ORAL_TABLET | Freq: Two times a day (BID) | ORAL | 1 refills | Status: DC

## 2016-08-04 NOTE — Addendum Note (Signed)
Addended by: Mauricio Po D on: 08/04/2016 09:40 PM   Modules accepted: Orders

## 2016-08-04 NOTE — Telephone Encounter (Signed)
Last refill was 07/23/16 but was sent to local pharmacy.

## 2016-08-04 NOTE — Telephone Encounter (Signed)
Medication refilled to be faxed.  

## 2016-08-05 NOTE — Telephone Encounter (Signed)
Medication faxed

## 2016-09-29 ENCOUNTER — Telehealth: Payer: Self-pay | Admitting: Family

## 2016-09-29 DIAGNOSIS — F32A Depression, unspecified: Secondary | ICD-10-CM

## 2016-09-29 DIAGNOSIS — F419 Anxiety disorder, unspecified: Secondary | ICD-10-CM

## 2016-09-29 DIAGNOSIS — F329 Major depressive disorder, single episode, unspecified: Secondary | ICD-10-CM

## 2016-09-29 NOTE — Telephone Encounter (Signed)
Patient would like a call back in regard to why [name] cut the dosage of clonazepam back.

## 2016-10-01 NOTE — Telephone Encounter (Signed)
Pt states that the quantity has changed from 90 tablets to 60 tablets and she was wanting to know if she can get 90 tablets sent in again. Please advise

## 2016-10-01 NOTE — Telephone Encounter (Signed)
It was changed as 60 is a 1 month supply of medications with refills. The other was 45 days at a time.

## 2016-10-06 MED ORDER — CLONAZEPAM 1 MG PO TABS: 1.0000 mg | ORAL_TABLET | Freq: Two times a day (BID) | ORAL | 1 refills | Status: DC

## 2016-10-06 NOTE — Telephone Encounter (Signed)
Pt is requesting a refill of klonopin. Please advise

## 2016-10-06 NOTE — Telephone Encounter (Signed)
Medication printed to be faxed.  

## 2016-10-06 NOTE — Telephone Encounter (Signed)
Please call patient back in regard at [phone].

## 2016-10-10 ENCOUNTER — Telehealth: Payer: Self-pay | Admitting: *Deleted

## 2016-10-10 DIAGNOSIS — G43809 Other migraine, not intractable, without status migrainosus: Secondary | ICD-10-CM

## 2016-10-10 MED ORDER — BUTALBITAL-APAP-CAFF-COD 50-325-40-30 MG PO CAPS: 1.0000 | ORAL_CAPSULE | Freq: Two times a day (BID) | ORAL | 0 refills | Status: DC | PRN

## 2016-10-10 NOTE — Telephone Encounter (Signed)
Pt is calling again about this refill.

## 2016-10-10 NOTE — Telephone Encounter (Signed)
Pt left msg on triage requesting refill on her fioricet...[name]

## 2016-10-10 NOTE — Telephone Encounter (Signed)
Medication printed and faxed.  

## 2016-10-13 MED ORDER — BUTALBITAL-APAP-CAFF-COD 50-325-40-30 MG PO CAPS: 1.0000 | ORAL_CAPSULE | Freq: Two times a day (BID) | ORAL | 0 refills | Status: DC | PRN

## 2016-10-13 NOTE — Telephone Encounter (Signed)
Pt called stating [facility] does not have this med. Please check and resend.

## 2016-10-13 NOTE — Telephone Encounter (Signed)
Called [facility] spoke w/pharmacist Al gave verbal authorization from 10/20...Catherine [name]

## 2016-10-13 NOTE — Addendum Note (Signed)
Addended by: Delice Bison E on: 10/13/2016 02:52 PM   Modules accepted: Orders

## 2016-10-28 ENCOUNTER — Other Ambulatory Visit: Payer: Self-pay | Admitting: Family

## 2016-10-28 ENCOUNTER — Ambulatory Visit (INDEPENDENT_AMBULATORY_CARE_PROVIDER_SITE_OTHER): Payer: Medicare Other

## 2016-10-28 DIAGNOSIS — Z23 Encounter for immunization: Secondary | ICD-10-CM | POA: Diagnosis not present

## 2016-11-07 ENCOUNTER — Other Ambulatory Visit: Payer: Self-pay | Admitting: Family

## 2016-11-07 DIAGNOSIS — F329 Major depressive disorder, single episode, unspecified: Secondary | ICD-10-CM

## 2016-11-07 DIAGNOSIS — F32A Depression, unspecified: Secondary | ICD-10-CM

## 2016-11-07 DIAGNOSIS — F419 Anxiety disorder, unspecified: Secondary | ICD-10-CM

## 2016-11-10 NOTE — Telephone Encounter (Signed)
Last refill was 10/06/16 with 1 refill

## 2016-11-11 ENCOUNTER — Other Ambulatory Visit: Payer: Self-pay

## 2016-11-11 ENCOUNTER — Telehealth: Payer: Self-pay

## 2016-11-11 DIAGNOSIS — G43809 Other migraine, not intractable, without status migrainosus: Secondary | ICD-10-CM

## 2016-11-11 MED ORDER — OMEPRAZOLE 20 MG PO CPDR: DELAYED_RELEASE_CAPSULE | ORAL | 1 refills | Status: DC

## 2016-11-11 MED ORDER — CARVEDILOL 6.25 MG PO TABS: ORAL_TABLET | ORAL | 1 refills | Status: DC

## 2016-11-11 MED ORDER — DULOXETINE HCL 30 MG PO CPEP: 30.0000 mg | ORAL_CAPSULE | Freq: Every day | ORAL | 1 refills | Status: DC

## 2016-11-11 MED ORDER — AMLODIPINE BESYLATE 10 MG PO TABS: 10.0000 mg | ORAL_TABLET | Freq: Every morning | ORAL | 1 refills | Status: DC

## 2016-11-11 MED ORDER — BUTALBITAL-APAP-CAFF-COD 50-325-40-30 MG PO CAPS: 1.0000 | ORAL_CAPSULE | Freq: Two times a day (BID) | ORAL | 0 refills | Status: DC | PRN

## 2016-11-11 NOTE — Telephone Encounter (Signed)
Pt called requesting refill of all her medications to [facility] [place]

## 2016-12-08 ENCOUNTER — Telehealth: Payer: Self-pay

## 2016-12-08 NOTE — Telephone Encounter (Signed)
Pt states that she is in pain due to arthritis and would like if you could send tylenol 3 in. I let her know that we have not seen her in 6 months and that she would need to be seen. She stated that she called a few days ago and she is aware she needs an appointment but her ride through [employer] stated it was not urgent for her to get a ride that day. She stated that she "gives up on trying to get a ride" that "nobody cares enough". I let her know I would try to talk with you to see what you would be willing to do until she could get it but I could not make any promises. Please advise on if you could send her something in until she can get a ride in. She states you have to set up a ride 3-4 days out.

## 2016-12-09 ENCOUNTER — Ambulatory Visit: Payer: Medicare Other | Admitting: Family

## 2016-12-10 MED ORDER — ACETAMINOPHEN-CODEINE #3 300-30 MG PO TABS: 1.0000 | ORAL_TABLET | ORAL | 0 refills | Status: DC | PRN

## 2016-12-10 NOTE — Telephone Encounter (Signed)
OV is needed for additional as it has been 6 months since her last office visit.

## 2016-12-10 NOTE — Telephone Encounter (Signed)
Pt aware.

## 2016-12-10 NOTE — Addendum Note (Signed)
Addended by: Mauricio Po D on: 12/10/2016 08:39 AM   Modules accepted: Orders

## 2016-12-11 NOTE — Telephone Encounter (Signed)
Pharmacist left msg on triage stating pt states rx for Tylenol # 3 suppose to been called to pharmacy, but we never received. Called pharmacy back spoke w/Kelly inform rx was faxed yesterday. She stated they never received. Gave verbal for Tylenol # 3...[name]

## 2016-12-16 ENCOUNTER — Other Ambulatory Visit: Payer: Self-pay | Admitting: Family

## 2016-12-16 DIAGNOSIS — F32A Depression, unspecified: Secondary | ICD-10-CM

## 2016-12-16 DIAGNOSIS — F419 Anxiety disorder, unspecified: Secondary | ICD-10-CM

## 2016-12-16 DIAGNOSIS — F329 Major depressive disorder, single episode, unspecified: Secondary | ICD-10-CM

## 2016-12-16 NOTE — Telephone Encounter (Signed)
Last refill was 11/11/16

## 2016-12-18 NOTE — Telephone Encounter (Signed)
Faxed script back to [facility]...[name]

## 2016-12-27 ENCOUNTER — Emergency Department (HOSPITAL_COMMUNITY): Payer: Medicare Other

## 2016-12-27 ENCOUNTER — Emergency Department (HOSPITAL_COMMUNITY)
Admission: EM | Admit: 2016-12-27 | Discharge: 2016-12-27 | Disposition: A | Discharge status: Home / Self Care | Payer: Medicare Other | Attending: Emergency Medicine | Admitting: Emergency Medicine

## 2016-12-27 ENCOUNTER — Encounter (HOSPITAL_COMMUNITY): Payer: Self-pay | Admitting: *Deleted

## 2016-12-27 DIAGNOSIS — Z79899 Other long term (current) drug therapy: Secondary | ICD-10-CM | POA: Insufficient documentation

## 2016-12-27 DIAGNOSIS — J069 Acute upper respiratory infection, unspecified: Secondary | ICD-10-CM | POA: Diagnosis not present

## 2016-12-27 DIAGNOSIS — I10 Essential (primary) hypertension: Secondary | ICD-10-CM | POA: Diagnosis not present

## 2016-12-27 DIAGNOSIS — R42 Dizziness and giddiness: Secondary | ICD-10-CM | POA: Diagnosis present

## 2016-12-27 DIAGNOSIS — I2510 Atherosclerotic heart disease of native coronary artery without angina pectoris: Secondary | ICD-10-CM | POA: Diagnosis not present

## 2016-12-27 DIAGNOSIS — Z7982 Long term (current) use of aspirin: Secondary | ICD-10-CM | POA: Diagnosis not present

## 2016-12-27 HISTORY — DX: Dizziness and giddiness: R42

## 2016-12-27 LAB — CBC
HCT: 38.1 % (ref 36.0–46.0)
Hemoglobin: 12.6 g/dL (ref 12.0–15.0)
MCH: 27 pg (ref 26.0–34.0)
MCHC: 33.1 g/dL (ref 30.0–36.0)
MCV: 81.8 fL (ref 78.0–100.0)
Platelets: 267 10*3/uL (ref 150–400)
RBC: 4.66 MIL/uL (ref 3.87–5.11)
RDW: 14.1 % (ref 11.5–15.5)
WBC: 3.8 10*3/uL — ABNORMAL LOW (ref 4.0–10.5)

## 2016-12-27 LAB — CBG MONITORING, ED
Glucose-Capillary: 89 mg/dL (ref 65–99)
Glucose-Capillary: 95 mg/dL (ref 65–99)

## 2016-12-27 LAB — BASIC METABOLIC PANEL
Anion gap: 9 (ref 5–15)
BUN: 5 mg/dL — ABNORMAL LOW (ref 6–20)
CO2: 24 mmol/L (ref 22–32)
Calcium: 9.6 mg/dL (ref 8.9–10.3)
Chloride: 106 mmol/L (ref 101–111)
Creatinine, Ser: 0.84 mg/dL (ref 0.44–1.00)
GFR calc Af Amer: >60 mL/min (ref >60)
GFR calc non Af Amer: >60 mL/min (ref >60)
Glucose, Bld: 97 mg/dL (ref 65–99)
Potassium: 3.1 mmol/L — ABNORMAL LOW (ref 3.5–5.1)
Sodium: 139 mmol/L (ref 135–145)

## 2016-12-27 LAB — URINALYSIS, ROUTINE W REFLEX MICROSCOPIC
Bacteria, UA: NONE SEEN
Bilirubin Urine: NEGATIVE
Glucose, UA: NEGATIVE mg/dL
Hgb urine dipstick: NEGATIVE
Ketones, ur: NEGATIVE mg/dL
Nitrite: NEGATIVE
Protein, ur: 30 mg/dL — AB
Specific Gravity, Urine: 1.015 (ref 1.005–1.030)
pH: 6 (ref 5.0–8.0)

## 2016-12-27 MED ORDER — POTASSIUM CHLORIDE CRYS ER 20 MEQ PO TBCR
20.0000 meq | EXTENDED_RELEASE_TABLET | Freq: Once | ORAL | Status: AC
  Administered 2016-12-27: 20 meq via ORAL
  Filled 2016-12-27: qty 1

## 2016-12-27 MED ORDER — AZITHROMYCIN 250 MG PO TABS: ORAL_TABLET | ORAL | 0 refills | Status: DC

## 2016-12-27 NOTE — ED Notes (Addendum)
Pt stated she feels a little dizzy now after her dizzy spell this morning at 11 :30 am. Michela Pitcher her head got really swimmy and she had to hold onto the rail and sat down. Pt stated that she gets dizzy and then it goes away. She says it makes her nauseated.

## 2016-12-27 NOTE — Discharge Instructions (Signed)
Please read and follow all provided instructions.  Your diagnoses today include:  1. Upper respiratory tract infection, unspecified type     Tests performed today include: Vital signs. See below for your results today.   Medications prescribed:  Take as prescribed   Home care instructions:  Follow any educational materials contained in this packet.  Follow-up instructions: Please follow-up with your primary care provider for further evaluation of symptoms and treatment   Return instructions:  Please return to the Emergency Department if you do not get better, if you get worse, or new symptoms OR  - Fever (temperature greater than 101.11F)  - Bleeding that does not stop with holding pressure to the area    -Severe pain (please note that you may be more sore the day after your accident)  - Chest Pain  - Difficulty breathing  - Severe nausea or vomiting  - Inability to tolerate food and liquids  - Passing out  - Skin becoming red around your wounds  - Change in mental status (confusion or lethargy)  - New numbness or weakness    Please return if you have any other emergent concerns.  Additional Information:  Your vital signs today were: BP 123/77 (BP Location: Right Arm)    Pulse 64    Temp 97.9 F (36.6 C) (Oral)    Resp 18    SpO2 97%  If your blood pressure (BP) was elevated above 135/85 this visit, please have this repeated by your doctor within one month. ---------------

## 2016-12-27 NOTE — ED Triage Notes (Signed)
Pt states acute onset dizziness this am, lasting for several minutes.  She was taking out trash and was able to sit down when the sensation came on.  Denies chest pain, sob, headache, ear pain, etc.  States hx of same several years ago and was told she had vertigo.

## 2016-12-27 NOTE — ED Provider Notes (Signed)
[facility] DEPT Provider Note   CSN: [id number] Arrival date & time: 12/27/16  1303  History   Chief Complaint Chief Complaint  Patient presents with  . Dizziness    HPI Catherine [name] is a 61 y.o. female.  HPI  61 y.o. female with a hx of CAD, HTN, presents to the Emergency Department today complaining of dizziness earlier today. Pt states that she was ambulating from driveway and felt room spinning briefly. Notes duration 2-3 seconds. Sat down with resolution of symptoms. Has experienced in past with vertigo. Denies symptoms currently. No headaches. No vision changes. No N/V. No CP/SOB/ABD pain. Notes URI symptoms x several days with productive cough and sinus congestion. No fevers at home. Non umbness/tingling. No unsetyady gait. No other symptoms noted.   Past Medical History:  Diagnosis Date  . Anxiety   . CAD (coronary artery disease)   . Hypertension   . Obesity   . Vertigo     Patient Active Problem List   Diagnosis Date Noted  . Proteinuria 05/29/2016  . Dysuria 04/03/2016  . Soft tissue lesion of foot 04/03/2016  . Obesity   . Vaginal discharge 10/02/2015  . Other bursal cyst, left ankle and foot 07/19/2015  . Anxiety and depression 05/04/2015  . LEUKOCYTOSIS 10/31/2010  . HYPOKALEMIA 09/18/2010  . GERD 09/18/2010  . TRANSAMINASES, SERUM, ELEVATED 04/08/2010  . DEPRESSION, PROLONGED 12/04/2009  . ANEMIA 11/07/2009  . CHEST PAIN, ATYPICAL 03/28/2009  . SEDIMENTATION RATE, ELEVATED 01/05/2009  . ABSCESS, TOOTH 12/27/2008  . HEPATIC CYST 12/25/2008  . Migraine 05/27/2007  . HYPERLIPIDEMIA 03/10/2007  . CORONARY ARTERY DISEASE 02/27/2007  . ANXIETY DISORDER, GENERALIZED 10/12/2006  . VERTIGO, BENIGN PAROXYSMAL POSITION 10/12/2006  . Essential hypertension 10/12/2006  . DENTAL CARIES 10/12/2006  . INSOMNIA 10/12/2006    Past Surgical History:  Procedure Laterality Date  . TUBAL LIGATION      OB History    No data available       Home  Medications    Prior to Admission medications   Medication Sig Start Date End Date Taking? Authorizing Provider  acetaminophen-codeine (TYLENOL #3) 300-30 MG tablet Take 1 tablet by mouth every 4 (four) hours as needed for moderate pain. 12/10/16   Golden Circle, FNP  albuterol (PROVENTIL HFA;VENTOLIN HFA) 108 (90 BASE) MCG/ACT inhaler Inhale 1-2 puffs into the lungs every 6 (six) hours as needed for wheezing or shortness of breath.    Historical Provider, MD  amLODipine (NORVASC) 10 MG tablet Take 1 tablet (10 mg total) by mouth every morning. 11/11/16   Golden Circle, FNP  aspirin 81 MG chewable tablet Chew 81 mg by mouth daily.    Historical Provider, MD  butalbital-acetaminophen-caffeine (FIORICET WITH CODEINE) 50-325-40-30 MG capsule Take 1 capsule by mouth 2 (two) times daily as needed for headache. 11/11/16   Golden Circle, FNP  carvedilol (COREG) 6.25 MG tablet TAKE 1 TABLET BY MOUTH TWO  TIMES DAILY WITH A MEAL 11/11/16   Golden Circle, FNP  Cholecalciferol (VITAMIN D PO) Take 1 tablet by mouth daily.    Historical Provider, MD  clonazePAM Bobbye Charleston) 1 MG tablet Take 1 tablet by mouth twice daily 12/16/16   Golden Circle, FNP  DULoxetine (CYMBALTA) 30 MG capsule Take 1 capsule (30 mg total) by mouth daily. 11/11/16   Golden Circle, FNP  omeprazole (PRILOSEC) 20 MG capsule TAKE 1 CAPSULE BY MOUTH  DAILY BEFORE BREAKFAST 11/11/16   Golden Circle, FNP  rosuvastatin (CRESTOR) 10  MG tablet Take 10 mg by mouth at bedtime.    Historical Provider, MD  SYMBICORT 80-4.5 MCG/ACT inhaler Use 2 puffs two times daily 05/12/16   Golden Circle, FNP    Family History Family History  Problem Relation Age of Onset  . Kidney disease Mother   . Seizures Mother   . Heart failure Father   . Colon cancer Neg Hx     Social History Social History  Substance Use Topics  . Smoking status: Never Smoker  . Smokeless tobacco: Never Used  . Alcohol use No   Allergies     Ciprofloxacin; Codeine; and Other  Review of Systems Review of Systems ROS reviewed and all are negative for acute change except as noted in the HPI.  Physical Exam Updated Vital Signs BP 141/87 (BP Location: Left Arm)   Pulse 72   Temp 98.3 F (36.8 C) (Oral)   Resp 16   SpO2 95%   Physical Exam  Constitutional: She is oriented to person, place, and time. Vital signs are normal. She appears well-developed and well-nourished. No distress.  HENT:  Head: Normocephalic and atraumatic.  Right Ear: Hearing, tympanic membrane, external ear and ear canal normal.  Left Ear: Hearing, tympanic membrane, external ear and ear canal normal.  Nose: Nose normal.  Mouth/Throat: Uvula is midline, oropharynx is clear and moist and mucous membranes are normal. No trismus in the jaw. No oropharyngeal exudate, posterior oropharyngeal erythema or tonsillar abscesses.  Eyes: Conjunctivae and EOM are normal. Pupils are equal, round, and reactive to light.  Neck: Normal range of motion. Neck supple. No tracheal deviation present.  Cardiovascular: Normal rate, regular rhythm, S1 normal, S2 normal, normal heart sounds, intact distal pulses and normal pulses.   Pulmonary/Chest: Effort normal and breath sounds normal. No respiratory distress. She has no decreased breath sounds. She has no wheezes. She has no rhonchi. She has no rales.  Abdominal: Normal appearance and bowel sounds are normal. There is no tenderness.  Musculoskeletal: Normal range of motion.  Neurological: She is alert and oriented to person, place, and time. She has normal strength. No cranial nerve deficit or sensory deficit.  Cranial Nerves:  II: Pupils equal, round, reactive to light III,IV, VI: ptosis not present, extra-ocular motions intact bilaterally  V,VII: smile symmetric, facial light touch sensation equal VIII: hearing grossly normal bilaterally  IX,X: midline uvula rise  XI: bilateral shoulder shrug equal and strong XII:  midline tongue extension Negative pronator drift. Finger to nose exam unremarkable Normal gait.   Skin: Skin is warm and dry.  Psychiatric: She has a normal mood and affect. Her speech is normal and behavior is normal. Thought content normal.  Nursing note and vitals reviewed.  ED Treatments / Results  Labs (all labs ordered are listed, but only abnormal results are displayed) Labs Reviewed  BASIC METABOLIC PANEL - Abnormal; Notable for the following:       Result Value   Potassium 3.1 (*)    BUN 5 (*)    All other components within normal limits  CBC - Abnormal; Notable for the following:    WBC 3.8 (*)    All other components within normal limits  URINALYSIS, ROUTINE W REFLEX MICROSCOPIC - Abnormal; Notable for the following:    APPearance HAZY (*)    Protein, ur 30 (*)    Leukocytes, UA TRACE (*)    Squamous Epithelial / LPF 0-5 (*)    All other components within normal limits  CBG MONITORING,  ED  CBG MONITORING, ED   EKG  EKG Interpretation None      Radiology Dg Chest 2 View  Result Date: 12/27/2016 CLINICAL DATA:  61 year old presenting with acute onset of cough and vertigo which began earlier today. Current history of hypertension and coronary artery disease. EXAM: CHEST  2 VIEW COMPARISON:  CT chest 11/29/2012. Chest x-rays 11/05/2012, 02/22/2011 and earlier. FINDINGS: Cardiac silhouette upper normal in size to slightly enlarged but stable. Thoracic aorta tortuous, unchanged. Hilar and mediastinal contours otherwise unremarkable. Linear atelectasis in both lower lobes. Prominent bronchovascular markings diffusely and moderate central peribronchial thickening, more so than on the prior examinations. No confluent airspace consolidation. No pleural effusions. Mild degenerative changes involving the thoracic spine and thoracolumbar dextroscoliosis. IMPRESSION: Moderate changes of acute bronchitis and/or asthma without focal airspace pneumonia. Mild linear atelectasis in the  lower lobes. Electronically Signed   By: Evangeline Dakin M.D.   On: 12/27/2016 19:17    Procedures Procedures (including critical care time)  Medications Ordered in ED Medications - No data to display   Initial Impression / Assessment and Plan / ED Course  I have reviewed the triage vital signs and the nursing notes.  Pertinent labs & imaging results that were available during my care of the patient were reviewed by me and considered in my medical decision making (see chart for details).  Clinical Course    Final Clinical Impressions(s) / ED Diagnoses  {I have reviewed and evaluated the relevant laboratory values. {I have reviewed and evaluated the relevant imaging studies.  {I have reviewed the relevant previous healthcare records.  {I obtained HPI from historian.   ED Course:  Assessment: Pt is a 60yF who presents with dizziness with onset this AM. <2 seconds. Resolved. No symptoms in ED. Noted URI symptoms with productive cough x several days. On exam, pt in NAD. Nontoxic/nonseptic appearing. VSS. Afebrile. Lungs CTA. Heart RRR. Abdomen nontender soft. CN evaluated and unremarkable. Gait normal. Labs unreamrkable. CXR shows bronchitic changes. Likely sinusitis vs URI causing symptoms. Will Rx Azithromycin. Doubt neurological etiology based on exam. Plan is to DC home with follow up to PCP. At time of discharge, Patient is in no acute distress. Vital Signs are stable. Patient is able to ambulate. Patient able to tolerate PO.   Disposition/Plan:  DC Home Additional Verbal discharge instructions given and discussed with patient.  Pt Instructed to f/u with PCP in the next week for evaluation and treatment of symptoms. Return precautions given Pt acknowledges and agrees with plan  Supervising Physician Nat Christen, MD  Final diagnoses:  Upper respiratory tract infection, unspecified type    New Prescriptions New Prescriptions   No medications on file     Shary Decamp,  PA-C 12/27/16 2014    Nat Christen, MD 12/28/16 [id number]

## 2016-12-27 NOTE — ED Notes (Signed)
Pt transported to Xray. 

## 2016-12-30 ENCOUNTER — Encounter: Payer: Self-pay | Admitting: Family

## 2016-12-30 ENCOUNTER — Ambulatory Visit (INDEPENDENT_AMBULATORY_CARE_PROVIDER_SITE_OTHER): Payer: Medicare Other | Admitting: Family

## 2016-12-30 ENCOUNTER — Other Ambulatory Visit (INDEPENDENT_AMBULATORY_CARE_PROVIDER_SITE_OTHER): Payer: Medicare Other

## 2016-12-30 VITALS — BP 124/84 | HR 89 | Temp 98.1°F | Resp 16 | Ht 61.5 in | Wt 197.8 lb

## 2016-12-30 DIAGNOSIS — R42 Dizziness and giddiness: Secondary | ICD-10-CM | POA: Diagnosis not present

## 2016-12-30 DIAGNOSIS — J0100 Acute maxillary sinusitis, unspecified: Secondary | ICD-10-CM | POA: Insufficient documentation

## 2016-12-30 DIAGNOSIS — J329 Chronic sinusitis, unspecified: Secondary | ICD-10-CM | POA: Insufficient documentation

## 2016-12-30 DIAGNOSIS — E876 Hypokalemia: Secondary | ICD-10-CM

## 2016-12-30 LAB — BASIC METABOLIC PANEL
BUN: 6 mg/dL (ref 6–23)
CO2: 28 mEq/L (ref 19–32)
Calcium: 9.5 mg/dL (ref 8.4–10.5)
Chloride: 104 mEq/L (ref 96–112)
Creatinine, Ser: 0.72 mg/dL (ref 0.40–1.20)
GFR: 105.5 mL/min (ref >60.00)
Glucose, Bld: 89 mg/dL (ref 70–99)
Potassium: 3.2 mEq/L — ABNORMAL LOW (ref 3.5–5.1)
Sodium: 139 mEq/L (ref 135–145)

## 2016-12-30 NOTE — Assessment & Plan Note (Signed)
Noted to have hypokalemia with most recent blood work and given a dose of potassium. Not currently maintained on any diuretics or medications associated with hypokalemia. Obtain basic metabolic profile. Consider potassium supplementation or addition of Ace/ARB pseudocysts with potassium and blood pressure management.

## 2016-12-30 NOTE — Progress Notes (Signed)
Subjective:    Patient ID: Catherine [name], female    DOB: Mar 14, 1956, 61 y.o.   MRN: [id number]  Chief Complaint  Patient presents with  . Hospitalization Follow-up    was seen in the hospital for dizziness and was told she had low potassium, still having some dizziness and body cramps    HPI:  Catherine [name] is a 60 y.o. female who  has a past medical history of Anxiety; CAD (coronary artery disease); Hypertension; Obesity; and Vertigo. and presents today for a follow up office visit.   This is a new problem. Recently seen in the evaluated for dizziness which was resolved upon discharge from the ED. Also noted to have either an URI or sinusitis and started on Azithromycin. Her potassium was noted to be slightly low at 3.1. She continues to have some dizziness on occasion. Reports starting to take the medication as prescribed. Continues to experience the associated symptoms of sinus congestion, cough, and mild sore throat. No fevers. Describes occasional dizziness like the room is spinning at times. Indicates she drinks plenty of water averaging about 1 gallon per day.   Allergies  Allergen Reactions  . Codeine Other (See Comments)    Patient states that it makes her sleepy. She is taking fioricet with codeine for pain.  . Other Nausea Only    Darvocet  . Ciprofloxacin Itching      Outpatient Medications Prior to Visit  Medication Sig Dispense Refill  . acetaminophen-codeine (TYLENOL #3) 300-30 MG tablet Take 1 tablet by mouth every 4 (four) hours as needed for moderate pain. 30 tablet 0  . albuterol (PROVENTIL HFA;VENTOLIN HFA) 108 (90 BASE) MCG/ACT inhaler Inhale 1-2 puffs into the lungs every 6 (six) hours as needed for wheezing or shortness of breath.    Marland Kitchen amLODipine (NORVASC) 10 MG tablet Take 1 tablet (10 mg total) by mouth every morning. 90 tablet 1  . aspirin 81 MG chewable tablet Chew 81 mg by mouth daily.    Marland Kitchen azithromycin (ZITHROMAX Z-PAK) 250 MG tablet Take as written 6  each 0  . butalbital-acetaminophen-caffeine (FIORICET WITH CODEINE) 50-325-40-30 MG capsule Take 1 capsule by mouth 2 (two) times daily as needed for headache. 60 capsule 0  . carvedilol (COREG) 6.25 MG tablet TAKE 1 TABLET BY MOUTH TWO  TIMES DAILY WITH A MEAL 180 tablet 1  . Cholecalciferol (VITAMIN D PO) Take 1 tablet by mouth daily.    . clonazePAM (KLONOPIN) 1 MG tablet Take 1 tablet by mouth twice daily 60 tablet 0  . DULoxetine (CYMBALTA) 30 MG capsule Take 1 capsule (30 mg total) by mouth daily. (Patient taking differently: Take 30 mg by mouth daily as needed (for anxiety). ) 90 capsule 1  . guaifenesin (ROBITUSSIN) 100 MG/5ML syrup Take 200 mg by mouth 3 (three) times daily as needed for cough.    Marland Kitchen omeprazole (PRILOSEC) 20 MG capsule TAKE 1 CAPSULE BY MOUTH  DAILY BEFORE BREAKFAST 90 capsule 1  . SYMBICORT 80-4.5 MCG/ACT inhaler Use 2 puffs two times daily 20.4 g 3  . rosuvastatin (CRESTOR) 10 MG tablet Take 10 mg by mouth at bedtime.     No facility-administered medications prior to visit.       Past Surgical History:  Procedure Laterality Date  . TUBAL LIGATION        Past Medical History:  Diagnosis Date  . Anxiety   . CAD (coronary artery disease)   . Hypertension   . Obesity   . Vertigo  Review of Systems  Constitutional: Negative for chills and fever.  HENT: Positive for congestion and sinus pressure. Negative for sore throat.   Respiratory: Positive for cough. Negative for chest tightness and shortness of breath.   Cardiovascular: Negative for chest pain, palpitations and leg swelling.  Neurological: Positive for dizziness. Negative for weakness and headaches.      Objective:    BP 124/84 (BP Location: Left Arm, Patient Position: Sitting, Cuff Size: Large)   Pulse 89   Temp 98.1 F (36.7 C) (Oral)   Resp 16   Ht 5' 1.5" (1.562 m)   Wt 197 lb 12.8 oz (89.7 kg)   SpO2 98%   BMI 36.77 kg/m  Nursing note and vital signs reviewed.  Physical Exam    Constitutional: She is oriented to person, place, and time. She appears well-developed and well-nourished. No distress.  HENT:  Right Ear: Hearing, tympanic membrane, external ear and ear canal normal.  Left Ear: Hearing, tympanic membrane, external ear and ear canal normal.  Nose: Right sinus exhibits maxillary sinus tenderness and frontal sinus tenderness. Left sinus exhibits maxillary sinus tenderness and frontal sinus tenderness.  Mouth/Throat: Uvula is midline, oropharynx is clear and moist and mucous membranes are normal.  Eyes: Conjunctivae and EOM are normal. Pupils are equal, round, and reactive to light.  Neck: Neck supple.  Cardiovascular: Normal rate, regular rhythm, normal heart sounds and intact distal pulses.   Pulmonary/Chest: Effort normal and breath sounds normal.  Neurological: She is alert and oriented to person, place, and time. No cranial nerve deficit.  Skin: Skin is warm and dry.  Psychiatric: She has a normal mood and affect. Her behavior is normal. Judgment and thought content normal.       Assessment & Plan:   Problem List Items Addressed This Visit      Respiratory   Sinusitis, acute maxillary - Primary    Symptoms and exam consistent with sinusitis. Continue previously prescribed azithromycin. Start Flonase as needed for congestion. Follow up if symptoms worsen or do not improve.         Other   HYPOKALEMIA    Noted to have hypokalemia with most recent blood work and given a dose of potassium. Not currently maintained on any diuretics or medications associated with hypokalemia. Obtain basic metabolic profile. Consider potassium supplementation or addition of Ace/ARB pseudocysts with potassium and blood pressure management.      Relevant Orders   Basic Metabolic Panel (BMET) (Completed)   Dizziness    Dizziness with symptoms consistent with vertigo although cannot rule our sinus / nasal congestion as a contributing source of symptoms. Start meclizine  as needed. Encouraged to continue drinking water. Consider referral to physical therapy if symptoms of vertigo continue. Continue current dosage of azithromycin.           I have discontinued Ms. Hodzic's rosuvastatin. I am also having her maintain her aspirin, Cholecalciferol (VITAMIN D PO), albuterol, SYMBICORT, butalbital-acetaminophen-caffeine, amLODipine, carvedilol, DULoxetine, omeprazole, acetaminophen-codeine, clonazePAM, guaifenesin, and azithromycin.   Follow-up: Return in about 3 months (around 03/30/2017), or if symptoms worsen or fail to improve.   Mauricio Po, FNP

## 2016-12-30 NOTE — Assessment & Plan Note (Deleted)
Symptoms and exam consistent with sinusitis. Continue previously prescribed azithromycin. Start Flonase as needed for congestion. Follow up if symptoms worsen or do not improve.

## 2016-12-30 NOTE — Patient Instructions (Signed)
Thank you for choosing [facility].  SUMMARY AND INSTRUCTIONS:  Please use meclazine as needed for dizziness.  Start Flonase for nasal congestion.   Continue to drink water as you have been.  We will refill your clonazepam when due.   Medication:  Your prescription(s) have been submitted to your pharmacy or been printed and provided for you. Please take as directed and contact our office if you believe you are having problem(s) with the medication(s) or have any questions.  Labs:  Please stop by the lab on the lower level of the building for your blood work. Your results will be released to [facility] (or called to you) after review, usually within 72 hours after test completion. If any changes need to be made, you will be notified at that same time.  1.) The lab is open from 7:30am to 5:30 pm Monday-Friday 2.) No appointment is necessary 3.) Fasting (if needed) is 6-8 hours after food and drink; black coffee and water are okay   Imaging / Radiology:  Please stop by radiology on the basement level of the building for your x-rays. Your results will be released to [facility] (or called to you) after review, usually within 72 hours after test completion. If any treatments or changes are necessary, you will be notified at that same time.  Referrals:  Referrals have been made during this visit. You should expect to hear back from our schedulers in about 7-10 days in regards to establishing an appointment with the specialists we discussed.   Follow up:  If your symptoms worsen or fail to improve, please contact our office for further instruction, or in case of emergency go directly to the emergency room at the closest medical facility.

## 2016-12-30 NOTE — Assessment & Plan Note (Signed)
Symptoms and exam consistent with sinusitis. Continue previously prescribed azithromycin. Start Flonase as needed for congestion. Follow up if symptoms worsen or do not improve.

## 2016-12-30 NOTE — Assessment & Plan Note (Signed)
Dizziness with symptoms consistent with vertigo although cannot rule our sinus / nasal congestion as a contributing source of symptoms. Start meclizine as needed. Encouraged to continue drinking water. Consider referral to physical therapy if symptoms of vertigo continue. Continue current dosage of azithromycin.

## 2017-01-01 ENCOUNTER — Telehealth: Payer: Self-pay | Admitting: Emergency Medicine

## 2017-01-01 NOTE — Telephone Encounter (Signed)
Pt called and stated when she was seen in the ER they gave her some injections to take. She said they made her stomach hurt and doesn't want to continue to take the ones she has. She wants to know if that will be ok. Please advise thanks.

## 2017-01-01 NOTE — Telephone Encounter (Signed)
I do not see any injections that she received or was prescribed.

## 2017-01-12 ENCOUNTER — Telehealth: Payer: Self-pay | Admitting: *Deleted

## 2017-01-12 NOTE — Telephone Encounter (Signed)
Rec'd call pt requesting refill on her Tylenol #3...Catherine [name]

## 2017-01-13 NOTE — Telephone Encounter (Signed)
Ok to refill with 60 tablets and no refills.

## 2017-01-14 ENCOUNTER — Telehealth: Payer: Self-pay | Admitting: *Deleted

## 2017-01-14 MED ORDER — FLUTICASONE PROPIONATE 50 MCG/ACT NA SUSP: 2.0000 | Freq: Every day | NASAL | 6 refills | Status: DC

## 2017-01-14 MED ORDER — AMOXICILLIN 500 MG PO CAPS: 500.0000 mg | ORAL_CAPSULE | Freq: Three times a day (TID) | ORAL | 0 refills | Status: DC

## 2017-01-14 MED ORDER — ACETAMINOPHEN-CODEINE #3 300-30 MG PO TABS: 1.0000 | ORAL_TABLET | ORAL | 0 refills | Status: DC | PRN

## 2017-01-14 NOTE — Telephone Encounter (Signed)
Pt called back again and forgot to mention to Lorre Nick about wanting a RX for flonase. Also her pharmacy is not allowing her to have the potassium [name] wanted her to get. Please Arnoldo Hooker, MS

## 2017-01-14 NOTE — Addendum Note (Signed)
Addended by: Earnstine Regal on: 01/14/2017 09:12 AM   Modules accepted: Orders

## 2017-01-14 NOTE — Telephone Encounter (Signed)
Notified pt w/MD response. Pt states she explain that to [facility], but they told her they would need an order from MD since they deliver her meds to her. Inform pt will hold msg until [name] return back on Tues to see if he want to rx potassium, and give her a call back w/his response...Catherine [name]

## 2017-01-14 NOTE — Telephone Encounter (Signed)
Notified pt refill has been approved and called into [facility]...Catherine [name]

## 2017-01-14 NOTE — Telephone Encounter (Signed)
Azithromycin added to allergies Amoxicillin sent to pharmacy Flonase sent to pharmacy  Her potassium has been low. I do not see a potassium prescription on her list.  Looks like greg wanted her to take otc potassium  - not a prescription.

## 2017-01-14 NOTE — Telephone Encounter (Signed)
Rec'd call pt states she was rx azithromycin at the ER, and when she saw [name] on 1/9 he inform her to take the zpack that was given to her. Pt states she was not able to take med it made her sick. N/V so she had stop taking. Still have cough/nasal congestion Requesting MD to rx something else. [name] is out office pls advise...[name]

## 2017-01-20 ENCOUNTER — Other Ambulatory Visit: Payer: Self-pay | Admitting: Family

## 2017-01-20 MED ORDER — POTASSIUM CHLORIDE ER 10 MEQ PO TBCR: 10.0000 meq | EXTENDED_RELEASE_TABLET | Freq: Every day | ORAL | 0 refills | Status: DC

## 2017-01-20 NOTE — Addendum Note (Signed)
Addended by: Mauricio Po D on: 01/20/2017 10:41 PM   Modules accepted: Orders

## 2017-01-20 NOTE — Telephone Encounter (Signed)
Potassium supplement sent to pharmacy. 

## 2017-01-21 NOTE — Telephone Encounter (Signed)
Notified pt Catherine [name] sent potassium to [facility]...Catherine [name]

## 2017-01-22 ENCOUNTER — Telehealth: Payer: Self-pay | Admitting: *Deleted

## 2017-01-22 NOTE — Telephone Encounter (Signed)
Patient would also like to know if [name] could start back giving her a 90 pills of clonazepam instead of 60.

## 2017-01-22 NOTE — Telephone Encounter (Signed)
Pt left msg on triage stating Dr. Quay Burow rx her some antibiotic, and the bottle spilled over in the sink. Wanting to know will Catherine [name] give her another antibiotic...[name]

## 2017-01-23 NOTE — Telephone Encounter (Signed)
She is prescribed clonazepam twice daily which is 60 pills per month. There is no need for 90 unless she is wishing to increase her dosage which will require an office visit.

## 2017-01-23 NOTE — Telephone Encounter (Signed)
Pt called again about this °

## 2017-01-26 ENCOUNTER — Other Ambulatory Visit: Payer: Self-pay | Admitting: Family

## 2017-01-26 DIAGNOSIS — F32A Depression, unspecified: Secondary | ICD-10-CM

## 2017-01-26 DIAGNOSIS — F419 Anxiety disorder, unspecified: Secondary | ICD-10-CM

## 2017-01-26 DIAGNOSIS — F329 Major depressive disorder, single episode, unspecified: Secondary | ICD-10-CM

## 2017-01-26 NOTE — Telephone Encounter (Signed)
Last refill:12/16/16

## 2017-01-26 NOTE — Telephone Encounter (Signed)
Pt is now saying that she use to get the 90 but all she wants is the 60 called in of the clonazepam   Pharmacy  [facility]

## 2017-01-27 NOTE — Telephone Encounter (Signed)
Faxed

## 2017-01-27 NOTE — Telephone Encounter (Signed)
Rx sent 

## 2017-02-03 ENCOUNTER — Telehealth: Payer: Self-pay | Admitting: *Deleted

## 2017-02-03 NOTE — Telephone Encounter (Signed)
Pt left msg on triage stating she still have cough w/chest congestion. Wanting to get a refill on the amoxillin that was rx...[name]

## 2017-02-04 MED ORDER — AMOXICILLIN-POT CLAVULANATE 875-125 MG PO TABS: 1.0000 | ORAL_TABLET | Freq: Two times a day (BID) | ORAL | 0 refills | Status: DC

## 2017-02-04 NOTE — Telephone Encounter (Signed)
Notified pt w/Greg response.../lmb 

## 2017-02-04 NOTE — Telephone Encounter (Signed)
Augmentin sent to pharmacy. If not improved will need OV.

## 2017-02-05 NOTE — Telephone Encounter (Signed)
Please advise patient that I am going to recommend she seek a new primary provider as I do not feel that I am able to provide the treatment that she is seeking. She is free to transfer to any other provider available.

## 2017-02-05 NOTE — Telephone Encounter (Signed)
Patient called in and stated she was not going to take the abx you sent it. It was too strong. She has never took It before but because the mg is so high she does not want to take it. I explained to her that you was trying to knock the cold out since she just got off abx not that long ago. She states you need to just send in what she took before. I informed her I would send the message her but she may just end up needing to make an office visit. Please follow up with patient.Thank you.

## 2017-02-06 ENCOUNTER — Telehealth: Payer: Self-pay | Admitting: Emergency Medicine

## 2017-02-06 NOTE — Telephone Encounter (Signed)
I called patient to inform her of notes. She just wanted to talk down about [name] and our office. But at the same time did not want to even think about going over to another doctor office or to see any other PCP. As I informed her he was trying to help with the abx he sent her, she wanted to say he was trying to hurt her and make her sick. She ended up just hanging up on me.

## 2017-02-06 NOTE — Telephone Encounter (Signed)
Pt call back requesting Amoxicillin 500 mg (Dr. Quay Burow sent in on 01/14/2017 when [name] was out).

## 2017-02-09 ENCOUNTER — Other Ambulatory Visit: Payer: Self-pay | Admitting: Family

## 2017-02-09 NOTE — Telephone Encounter (Signed)
Patient is requesting all her medication to be refilled because she is leaving the practice. ??

## 2017-02-09 NOTE — Telephone Encounter (Signed)
Patient called in again this morning and said she would find a new pcp since we can't give her the medication she needs.

## 2017-02-09 NOTE — Telephone Encounter (Signed)
If the medication did not work the last time it is unlikely that it will work again.

## 2017-02-10 NOTE — Telephone Encounter (Signed)
Dismissal form and letter for pt have been done. Please advise on refills

## 2017-02-10 NOTE — Telephone Encounter (Signed)
Routing to greg, please advise, thanks 

## 2017-02-11 ENCOUNTER — Other Ambulatory Visit: Payer: Self-pay | Admitting: Family

## 2017-02-11 DIAGNOSIS — F32A Depression, unspecified: Secondary | ICD-10-CM

## 2017-02-11 DIAGNOSIS — F329 Major depressive disorder, single episode, unspecified: Secondary | ICD-10-CM

## 2017-02-11 DIAGNOSIS — F419 Anxiety disorder, unspecified: Secondary | ICD-10-CM

## 2017-02-11 NOTE — Telephone Encounter (Signed)
Ok to fill medications needing refills for 30 days.

## 2017-02-12 ENCOUNTER — Telehealth: Payer: Self-pay | Admitting: Family

## 2017-02-12 NOTE — Telephone Encounter (Signed)
Patient dismissed from [facility] by Terri Piedra FNP, effective February 10, 2017. Dismissal letter sent out by certified / registered mail. DAJ

## 2017-02-13 NOTE — Telephone Encounter (Signed)
Faxed

## 2017-02-17 NOTE — Telephone Encounter (Signed)
Received signed domestic return receipt verifying delivery of certified letter on February 14, 2017. Article number [id number] [place]

## 2017-03-10 ENCOUNTER — Other Ambulatory Visit: Payer: Self-pay | Admitting: Family

## 2017-03-10 DIAGNOSIS — F419 Anxiety disorder, unspecified: Secondary | ICD-10-CM

## 2017-03-10 DIAGNOSIS — F32A Depression, unspecified: Secondary | ICD-10-CM

## 2017-03-10 DIAGNOSIS — F329 Major depressive disorder, single episode, unspecified: Secondary | ICD-10-CM

## 2017-03-18 ENCOUNTER — Other Ambulatory Visit: Payer: Self-pay | Admitting: Family

## 2017-03-18 DIAGNOSIS — F419 Anxiety disorder, unspecified: Secondary | ICD-10-CM

## 2017-03-18 DIAGNOSIS — F329 Major depressive disorder, single episode, unspecified: Secondary | ICD-10-CM

## 2017-03-18 DIAGNOSIS — F32A Depression, unspecified: Secondary | ICD-10-CM

## 2017-03-24 ENCOUNTER — Other Ambulatory Visit: Payer: Self-pay | Admitting: Family

## 2017-04-03 ENCOUNTER — Other Ambulatory Visit: Payer: Self-pay | Admitting: Nurse Practitioner

## 2017-04-03 DIAGNOSIS — Z1231 Encounter for screening mammogram for malignant neoplasm of breast: Secondary | ICD-10-CM

## 2017-04-15 ENCOUNTER — Other Ambulatory Visit: Payer: Self-pay | Admitting: Family

## 2017-05-14 ENCOUNTER — Ambulatory Visit: Payer: Medicare Other

## 2017-05-22 ENCOUNTER — Other Ambulatory Visit: Payer: Self-pay | Admitting: Family

## 2017-05-22 DIAGNOSIS — G43809 Other migraine, not intractable, without status migrainosus: Secondary | ICD-10-CM

## 2017-05-25 ENCOUNTER — Other Ambulatory Visit: Payer: Self-pay | Admitting: Family

## 2017-05-25 DIAGNOSIS — G43809 Other migraine, not intractable, without status migrainosus: Secondary | ICD-10-CM

## 2017-05-29 ENCOUNTER — Ambulatory Visit
Admission: RE | Admit: 2017-05-29 | Discharge: 2017-05-29 | Disposition: A | Discharge status: Home / Self Care | Payer: Medicare Other | Source: Ambulatory Visit | Attending: Nurse Practitioner | Admitting: Nurse Practitioner

## 2017-05-29 DIAGNOSIS — Z1231 Encounter for screening mammogram for malignant neoplasm of breast: Secondary | ICD-10-CM

## 2017-07-15 ENCOUNTER — Other Ambulatory Visit: Payer: Self-pay | Admitting: Family

## 2017-07-22 ENCOUNTER — Other Ambulatory Visit: Payer: Self-pay | Admitting: Family

## 2017-10-23 ENCOUNTER — Ambulatory Visit: Payer: Medicare Other | Admitting: Family Medicine

## 2017-10-27 ENCOUNTER — Ambulatory Visit (INDEPENDENT_AMBULATORY_CARE_PROVIDER_SITE_OTHER): Payer: Medicare Other | Admitting: Family Medicine

## 2017-10-27 ENCOUNTER — Encounter: Payer: Self-pay | Admitting: Family Medicine

## 2017-10-27 VITALS — BP 148/82 | HR 68 | Temp 98.3°F | Resp 16 | Ht 61.5 in | Wt 193.8 lb

## 2017-10-27 DIAGNOSIS — Z23 Encounter for immunization: Secondary | ICD-10-CM

## 2017-10-27 DIAGNOSIS — R350 Frequency of micturition: Secondary | ICD-10-CM | POA: Diagnosis not present

## 2017-10-27 DIAGNOSIS — M546 Pain in thoracic spine: Secondary | ICD-10-CM

## 2017-10-27 LAB — POCT URINALYSIS DIP (MANUAL ENTRY)
Bilirubin, UA: NEGATIVE
Blood, UA: NEGATIVE
Glucose, UA: NEGATIVE mg/dL
Ketones, POC UA: NEGATIVE mg/dL
Nitrite, UA: NEGATIVE
Protein Ur, POC: NEGATIVE mg/dL
Spec Grav, UA: <=1.005 — AB (ref 1.010–1.025)
Urobilinogen, UA: 0.2 E.U./dL
pH, UA: 6.5 (ref 5.0–8.0)

## 2017-10-27 NOTE — Progress Notes (Signed)
11/6/20186:38 PM  Catherine [name] January 27, 1956, 61 y.o. female [id number]  Chief Complaint  Patient presents with  . New Patient (Initial Visit)    back pain, onset March/April of this year, fell on back while pushing mom to bus stop, fell on thick piece of black ice.  Pain level 10/10, not taking any medication for back pain.      HPI:   Patient is a 61 y.o. female who presents today for lower thoracic back pain that started after she slipped on black ice this spring, she had xrays that showed no fracture and very mild DDD. She reports that pain is mostly left sided, does not radiate. Constant, 10/10, sharp pain. She states that NSAIDs have not been helpful, previous physician discussed concerns about giving her opiates given her current rx of clonazepam. She has not tried any other treatment modalities.   She is also c/o of a couple of days of urinary frequency, no dysuria or hematuria.  Depression screen PHQ 2/9 10/27/2017  Decreased Interest 0  Down, Depressed, Hopeless 0  PHQ - 2 Score 0  Some recent data might be hidden    Allergies  Allergen Reactions  . Codeine Other (See Comments)    Patient states that it makes her sleepy. She is taking fioricet with codeine for pain.  . Other Nausea Only    Darvocet  . Azithromycin Nausea And Vomiting  . Ciprofloxacin Itching    Prior to Admission medications   Medication Sig Start Date End Date Taking? Authorizing Provider  acetaminophen-codeine (TYLENOL #3) 300-30 MG tablet Take 1 tablet by mouth every 4 (four) hours as needed for moderate pain. 01/14/17  Yes Golden Circle, FNP  albuterol (PROVENTIL HFA;VENTOLIN HFA) 108 (90 BASE) MCG/ACT inhaler Inhale 1-2 puffs into the lungs every 6 (six) hours as needed for wheezing or shortness of breath.   Yes [provider]  amLODipine (NORVASC) 10 MG tablet Take 1 tablet (10 mg total) by mouth every morning. 02/11/17  Yes Golden Circle, FNP  aspirin 81 MG chewable tablet Chew  81 mg by mouth daily.   Yes [provider]  carvedilol (COREG) 6.25 MG tablet TAKE 1 TABLET BY MOUTH TWO  TIMES DAILY WITH A MEAL 11/11/16  Yes Golden Circle, FNP  Cholecalciferol (VITAMIN D PO) Take 1 tablet by mouth daily.   Yes [provider]  fluticasone (FLONASE) 50 MCG/ACT nasal spray Place 2 sprays into both nostrils daily. 01/14/17  Yes Burns, Claudina Lick, MD  omeprazole (PRILOSEC) 20 MG capsule TAKE 1 CAPSULE BY MOUTH  DAILY BEFORE BREAKFAST 11/11/16  Yes Golden Circle, FNP  potassium chloride (K-DUR) 10 MEQ tablet Take 1 tablet (10 mEq total) by mouth daily. 01/20/17  Yes Golden Circle, FNP  SYMBICORT 80-4.5 MCG/ACT inhaler Use 2 puffs TWICE DAILY (120/4=30) 01/20/17  Yes Golden Circle, FNP  amoxicillin (AMOXIL) 500 MG capsule Take 1 capsule (500 mg total) by mouth 3 (three) times daily. Patient not taking: Reported on 10/27/2017 01/14/17   Binnie Rail, MD  amoxicillin-clavulanate (AUGMENTIN) 875-125 MG tablet Take 1 tablet by mouth 2 (two) times daily. Patient not taking: Reported on 10/27/2017 02/04/17   Golden Circle, FNP  butalbital-acetaminophen-caffeine (FIORICET WITH CODEINE) [phone] MG capsule Take 1 capsule by mouth 2 (two) times daily as needed for headache. Patient not taking: Reported on 10/27/2017 11/11/16   Golden Circle, FNP  clonazePAM (KLONOPIN) 1 MG tablet take 1 TABLET BY MOUTH TWICE DAILY Patient  not taking: Reported on 10/27/2017 02/12/17   Golden Circle, FNP  DULoxetine (CYMBALTA) 30 MG capsule Take 1 capsule (30 mg total) by mouth daily. Patient not taking: Reported on 10/27/2017 02/11/17   Golden Circle, FNP  guaifenesin (ROBITUSSIN) 100 MG/5ML syrup Take 200 mg by mouth 3 (three) times daily as needed for cough.    [provider]    Past Medical History:  Diagnosis Date  . Anxiety   . CAD (coronary artery disease)   . Hypertension   . Obesity   . Vertigo     Past Surgical History:  Procedure  Laterality Date  . TUBAL LIGATION      Social History   Tobacco Use  . Smoking status: Never Smoker  . Smokeless tobacco: Never Used  Substance Use Topics  . Alcohol use: No    Family History  Problem Relation Age of Onset  . Kidney disease Mother   . Seizures Mother   . Heart failure Father   . Colon cancer Neg Hx   . Breast cancer Neg Hx     Review of Systems  Constitutional: Negative for chills and fever.  Gastrointestinal: Negative for abdominal pain, nausea and vomiting.  Musculoskeletal: Positive for back pain and falls.  Neurological: Negative for tingling and focal weakness.     OBJECTIVE:  Blood pressure (!) 148/82, pulse 68, temperature 98.3 F (36.8 C), temperature source Oral, resp. rate 16, height 5' 1.5" (1.562 m), weight 193 lb 12.8 oz (87.9 kg), SpO2 98 %.  Physical Exam  Constitutional: She is oriented to person, place, and time and well-developed, well-nourished, and in no distress.  HENT:  Head: Normocephalic and atraumatic.  Mouth/Throat: Oropharynx is clear and moist. No oropharyngeal exudate.  Eyes: EOM are normal. Pupils are equal, round, and reactive to light. No scleral icterus.  Neck: Neck supple.  Cardiovascular: Normal rate, regular rhythm and normal heart sounds. Exam reveals no gallop and no friction rub.  No murmur heard. Pulmonary/Chest: Effort normal and breath sounds normal. She has no wheezes. She has no rales.  Abdominal: There is no CVA tenderness.  Musculoskeletal: She exhibits no edema.       Thoracic back: She exhibits tenderness and spasm. She exhibits no bony tenderness.  Neurological: She is alert and oriented to person, place, and time. She has normal strength and normal reflexes. She has a normal Straight Leg Raise Test. Gait normal.  Skin: Skin is warm and dry.      Results for orders placed or performed in visit on 10/27/17 (from the past 24 hour(s))  POCT urinalysis dipstick     Status: Abnormal   Collection Time:  10/27/17 10:30 AM  Result Value Ref Range   Color, UA colorless (A) yellow   Clarity, UA cloudy (A) clear   Glucose, UA negative negative mg/dL   Bilirubin, UA negative negative   Ketones, POC UA negative negative mg/dL   Spec Grav, UA <=1.005 (A) 1.010 - 1.025   Blood, UA negative negative   pH, UA 6.5 5.0 - 8.0   Protein Ur, POC negative negative mg/dL   Urobilinogen, UA 0.2 0.2 or 1.0 E.U./dL   Nitrite, UA Negative Negative   Leukocytes, UA Moderate (2+) (A) Negative    No results found.   ASSESSMENT and PLAN  1. Lower thoracic back pain Discussed conservative measures, NSAIDs, ice/heat, referring to physical therapy - Ambulatory referral to Physical Therapy  2. Urinary frequency - POCT urinalysis dipstick - abnormal, sending for  cx - Urine Culture  3. Need for prophylactic vaccination and inoculation against influenza - Flu Vaccine QUAD 36+ mos IM - given today  Return if symptoms worsen or fail to improve.    Rutherford Guys, MD [facility], [place] [address] Ph.  [phone] Fax [phone]

## 2017-10-27 NOTE — Patient Instructions (Signed)
     IF you received an x-ray today, you will receive an invoice from [facility]. Please contact [facility] at [phone] with questions or concerns regarding your invoice.   IF you received labwork today, you will receive an invoice from [facility]. Please contact [facility] at [phone] with questions or concerns regarding your invoice.   Our billing staff will not be able to assist you with questions regarding bills from these companies.  You will be contacted with the lab results as soon as they are available. The fastest way to get your results is to activate your My Chart account. Instructions are located on the last page of this paperwork. If you have not heard from us regarding the results in 2 weeks, please contact this office.     

## 2017-10-28 LAB — URINE CULTURE: Organism ID, Bacteria: NO GROWTH

## 2017-10-29 ENCOUNTER — Telehealth: Payer: Self-pay | Admitting: Family Medicine

## 2017-10-29 NOTE — Telephone Encounter (Signed)
She needs to be seen to establish care. I thought I was just seeing her for her back pain, not that she was going to establish with me. We did not talk about these meds except for acknowledging that she takes them.Thanks

## 2017-10-29 NOTE — Telephone Encounter (Signed)
Pt would like a refill on:  Clonazepam Symbicort Fiorcet  Please advise.    Copied from [place] [id number]. Topic: Inquiry >> Oct 27, 2017 12:57 PM Ether Griffins B wrote: Reason for CRM: pt just received two insurance cards in the mail from [facility] with her pcp as [place]. Pt saw [place] today and pt states she wouldnt refill her meds bc she didn't know if she would be her pcp. Shes wanting to know why [employer] would appoint [place] as her pcp. Pt also wants to know about her medicine being refilled.

## 2017-10-29 NOTE — Telephone Encounter (Signed)
Patient is calling to get her urine culture results- assured patient if anything was wrong she would have been notified. She wants Dr Pamella Pert to be her primary care doctor. She wants to know if the doctor will handle her prescriptions for Klonopin and Symbicort.  Told patient I would relay her concerns and the office would be back in touch with the response to her questions. Contact [phone]

## 2017-10-30 NOTE — Telephone Encounter (Signed)
Pt would like to set up appt to establish care with Dr. Pamella Pert.  Please reach out to her to schedule.

## 2017-11-03 ENCOUNTER — Telehealth: Payer: Self-pay | Admitting: Family Medicine

## 2017-11-03 NOTE — Telephone Encounter (Signed)
Pt. Called to request result of urine culture of 11/6.  Reported she has been waiting a week.  Advised that the MD hasn't reviewed / signed off on the culture report yet.  The pt. Verbalized being very upset about having to wait.  Phonecall to the Flow Coordinator @ [facility].  Confirmed that Dr. Pamella Pert needs to review and sign off on the UC, before results will be given.    Informed pt. that nurse in office was made aware of the pt's frustration in awaiting results of UC. Stated she expects a call back today, and doesn't want to wait another day.  Acknowledged pt's. feelings of frustration, waiting on results.  Advised will send message to office with high priority.  Pt. Agreed.

## 2017-11-03 NOTE — Telephone Encounter (Signed)
Please apologize for the delay, I reviewed the urine culture, which was negative, on 10/28/17. I thought I had sent a not for patient to be called but obviously did not do that correctly. Thanks

## 2017-11-04 ENCOUNTER — Ambulatory Visit: Payer: Medicare Other | Admitting: Physical Therapy

## 2017-11-04 NOTE — Telephone Encounter (Signed)
Patient notified of results and also apologized for the delay. She was very understanding- she stated she was just worried that something was wrong. She asked about becoming a patient and I told her she would need to schedule a physical and then could discuss her medication management at that appointment. She will call back at her convenience to schedule.

## 2017-11-04 NOTE — Telephone Encounter (Signed)
Sent her to nurse traige

## 2017-11-10 ENCOUNTER — Ambulatory Visit: Payer: Medicare Other | Attending: Family Medicine

## 2017-11-19 ENCOUNTER — Other Ambulatory Visit: Payer: Self-pay | Admitting: Internal Medicine

## 2017-11-27 DIAGNOSIS — G43009 Migraine without aura, not intractable, without status migrainosus: Secondary | ICD-10-CM | POA: Diagnosis not present

## 2017-11-27 DIAGNOSIS — I10 Essential (primary) hypertension: Secondary | ICD-10-CM | POA: Diagnosis not present

## 2017-11-27 DIAGNOSIS — M150 Primary generalized (osteo)arthritis: Secondary | ICD-10-CM | POA: Diagnosis not present

## 2017-11-27 DIAGNOSIS — E782 Mixed hyperlipidemia: Secondary | ICD-10-CM | POA: Diagnosis not present

## 2017-11-27 DIAGNOSIS — Z79899 Other long term (current) drug therapy: Secondary | ICD-10-CM | POA: Diagnosis not present

## 2017-12-13 ENCOUNTER — Emergency Department (HOSPITAL_COMMUNITY)
Admission: EM | Admit: 2017-12-13 | Discharge: 2017-12-13 | Disposition: A | Discharge status: Home / Self Care | Payer: Medicare Other | Attending: Emergency Medicine | Admitting: Emergency Medicine

## 2017-12-13 ENCOUNTER — Other Ambulatory Visit: Payer: Self-pay

## 2017-12-13 ENCOUNTER — Encounter (HOSPITAL_COMMUNITY): Payer: Self-pay | Admitting: *Deleted

## 2017-12-13 DIAGNOSIS — R220 Localized swelling, mass and lump, head: Secondary | ICD-10-CM | POA: Diagnosis present

## 2017-12-13 DIAGNOSIS — I10 Essential (primary) hypertension: Secondary | ICD-10-CM | POA: Insufficient documentation

## 2017-12-13 DIAGNOSIS — Z7982 Long term (current) use of aspirin: Secondary | ICD-10-CM | POA: Insufficient documentation

## 2017-12-13 DIAGNOSIS — L02811 Cutaneous abscess of head [any part, except face]: Secondary | ICD-10-CM | POA: Insufficient documentation

## 2017-12-13 MED ORDER — DOXYCYCLINE HYCLATE 100 MG PO CAPS: 100.0000 mg | ORAL_CAPSULE | Freq: Two times a day (BID) | ORAL | 0 refills | Status: AC

## 2017-12-13 MED ORDER — LIDOCAINE-EPINEPHRINE 1 %-1:100000 IJ SOLN
10.0000 mL | Freq: Once | INTRAMUSCULAR | Status: DC
  Filled 2017-12-13: qty 10

## 2017-12-13 MED ORDER — ACETAMINOPHEN 325 MG PO TABS
650.0000 mg | ORAL_TABLET | Freq: Once | ORAL | Status: AC
  Administered 2017-12-13: 650 mg via ORAL
  Filled 2017-12-13: qty 2

## 2017-12-13 MED ORDER — LIDOCAINE-EPINEPHRINE (PF) 2 %-1:200000 IJ SOLN: 10.0000 mL | Freq: Once | INTRAMUSCULAR | Status: DC

## 2017-12-13 NOTE — ED Notes (Signed)
Declined W/C at D/C and was escorted to lobby by RN. 

## 2017-12-13 NOTE — ED Triage Notes (Signed)
The pt is c/o a bump on her head where she tied her scarf too tight  2 days ago now she reports that it is draining.  She still has a scarf on

## 2017-12-13 NOTE — ED Provider Notes (Signed)
[facility] EMERGENCY DEPARTMENT Provider Note   CSN: [id number] Arrival date & time: 12/13/17  1526     History   Chief Complaint Chief Complaint  Patient presents with  . bump on head    HPI Catherine [name] is a 61 y.o. female.  HPI  Patient is a 61 y.o. female with a history of HTN, CAD, and anxiety presenting for a bump on the head in the hairline.  Patient reports that for the past 3-3 days she has noticed a growing bump in the midline of her forehead on her hairline.  Patient reports that this is where her scarf usually sits on her head and feels that has been irritating her scalp.  Patient denies any surrounding erythema.  Patient reports that today she compressed the bump and purulence was expressed from it.  Patient denies any fever or chills.  Patient denies any other similar infections.  Patient is not diabetic.  No history of immunocompromise state.  Past Medical History:  Diagnosis Date  . Anxiety   . CAD (coronary artery disease)   . Hypertension   . Obesity   . Vertigo     Patient Active Problem List   Diagnosis Date Noted  . Sinusitis, acute maxillary 12/30/2016  . Dizziness 12/30/2016  . Sinusitis 12/30/2016  . Proteinuria 05/29/2016  . Dysuria 04/03/2016  . Soft tissue lesion of foot 04/03/2016  . Obesity   . Vaginal discharge 10/02/2015  . Other bursal cyst, left ankle and foot 07/19/2015  . Anxiety and depression 05/04/2015  . LEUKOCYTOSIS 10/31/2010  . HYPOKALEMIA 09/18/2010  . GERD 09/18/2010  . TRANSAMINASES, SERUM, ELEVATED 04/08/2010  . DEPRESSION, PROLONGED 12/04/2009  . ANEMIA 11/07/2009  . CHEST PAIN, ATYPICAL 03/28/2009  . SEDIMENTATION RATE, ELEVATED 01/05/2009  . ABSCESS, TOOTH 12/27/2008  . HEPATIC CYST 12/25/2008  . Migraine 05/27/2007  . HYPERLIPIDEMIA 03/10/2007  . CORONARY ARTERY DISEASE 02/27/2007  . ANXIETY DISORDER, GENERALIZED 10/12/2006  . VERTIGO, BENIGN PAROXYSMAL POSITION 10/12/2006  . Essential  hypertension 10/12/2006  . DENTAL CARIES 10/12/2006  . INSOMNIA 10/12/2006    Past Surgical History:  Procedure Laterality Date  . TUBAL LIGATION      OB History    No data available       Home Medications    Prior to Admission medications   Medication Sig Start Date End Date Taking? Authorizing Provider  acetaminophen-codeine (TYLENOL #3) 300-30 MG tablet Take 1 tablet by mouth every 4 (four) hours as needed for moderate pain. 01/14/17   Golden Circle, FNP  albuterol (PROVENTIL HFA;VENTOLIN HFA) 108 (90 BASE) MCG/ACT inhaler Inhale 1-2 puffs into the lungs every 6 (six) hours as needed for wheezing or shortness of breath.    [provider]  amLODipine (NORVASC) 10 MG tablet Take 1 tablet (10 mg total) by mouth every morning. 02/11/17   Golden Circle, FNP  amoxicillin (AMOXIL) 500 MG capsule Take 1 capsule (500 mg total) by mouth 3 (three) times daily. Patient not taking: Reported on 10/27/2017 01/14/17   Binnie Rail, MD  amoxicillin-clavulanate (AUGMENTIN) 875-125 MG tablet Take 1 tablet by mouth 2 (two) times daily. Patient not taking: Reported on 10/27/2017 02/04/17   Golden Circle, FNP  aspirin 81 MG chewable tablet Chew 81 mg by mouth daily.    [provider]  butalbital-acetaminophen-caffeine (FIORICET WITH CODEINE) 50-325-40-30 MG capsule Take 1 capsule by mouth 2 (two) times daily as needed for headache. Patient not taking: Reported on 10/27/2017 11/11/16  Golden Circle, FNP  carvedilol (COREG) 6.25 MG tablet TAKE 1 TABLET BY MOUTH TWO  TIMES DAILY WITH A MEAL 11/11/16   Golden Circle, FNP  Cholecalciferol (VITAMIN D PO) Take 1 tablet by mouth daily.    [provider]  clonazePAM (KLONOPIN) 1 MG tablet take 1 TABLET BY MOUTH TWICE DAILY Patient not taking: Reported on 10/27/2017 02/12/17   Golden Circle, FNP  doxycycline (VIBRAMYCIN) 100 MG capsule Take 1 capsule (100 mg total) by mouth 2 (two) times daily for 5 days. 12/13/17  12/18/17  Langston Masker B, PA-C  DULoxetine (CYMBALTA) 30 MG capsule Take 1 capsule (30 mg total) by mouth daily. Patient not taking: Reported on 10/27/2017 02/11/17   Golden Circle, FNP  fluticasone [facility]) 50 MCG/ACT nasal spray Place 2 sprays into both nostrils daily. 01/14/17   Binnie Rail, MD  guaifenesin (ROBITUSSIN) 100 MG/5ML syrup Take 200 mg by mouth 3 (three) times daily as needed for cough.    [provider]  omeprazole (PRILOSEC) 20 MG capsule TAKE 1 CAPSULE BY MOUTH  DAILY BEFORE BREAKFAST 11/11/16   Golden Circle, FNP  potassium chloride (K-DUR) 10 MEQ tablet Take 1 tablet (10 mEq total) by mouth daily. 01/20/17   Golden Circle, FNP  SYMBICORT 80-4.5 MCG/ACT inhaler Use 2 puffs TWICE DAILY (120/4=30) 01/20/17   Golden Circle, FNP    Family History Family History  Problem Relation Age of Onset  . Kidney disease Mother   . Seizures Mother   . Heart failure Father   . Colon cancer Neg Hx   . Breast cancer Neg Hx     Social History Social History   Tobacco Use  . Smoking status: Never Smoker  . Smokeless tobacco: Never Used  Substance Use Topics  . Alcohol use: No  . Drug use: No     Allergies   Codeine; Other; Azithromycin; and Ciprofloxacin   Review of Systems Review of Systems  Constitutional: Negative for chills and fever.  Gastrointestinal: Negative for nausea and vomiting.  Skin: Positive for color change.     Physical Exam Updated Vital Signs BP (!) 157/85 (BP Location: Right Arm)   Pulse 72   Temp 98.5 F (36.9 C) (Oral)   Resp 18   Ht 5\' 1"  (1.549 m)   Wt 86.2 kg (190 lb)   SpO2 100%   BMI 35.90 kg/m   Physical Exam  Constitutional: She appears well-developed and well-nourished. No distress.  Sitting comfortably in bed.  HENT:  Head: Normocephalic and atraumatic.  Mouth/Throat: Oropharynx is clear and moist.  Eyes: Conjunctivae are normal. Right eye exhibits no discharge. Left eye exhibits no discharge.    EOMs normal to gross examination.  Neck: Normal range of motion. Neck supple.  Cardiovascular: Normal rate, regular rhythm and normal heart sounds.  Pulmonary/Chest: Effort normal and breath sounds normal.  Normal respiratory effort. Patient converses comfortably. No audible wheeze or stridor.  Abdominal: She exhibits no distension.  Musculoskeletal: Normal range of motion.  Lymphadenopathy:    She has no cervical adenopathy.  Neurological: She is alert.  Cranial nerves intact to gross observation. Patient moves extremities without difficulty.  Skin: Skin is warm and dry. She is not diaphoretic.  There is approximately 1.5 cm area of fluctuance in the midline of the forehead at the hairline.  There is scabbing over this area.  There is an area of purulence noted.  No facial swelling, facial area edema, or facial edema.  Psychiatric: She has a normal mood and affect. Her behavior is normal. Judgment and thought content normal.  Nursing note and vitals reviewed.    ED Treatments / Results  Labs (all labs ordered are listed, but only abnormal results are displayed) Labs Reviewed - No data to display  EKG  EKG Interpretation None       Radiology No results found.  Procedures .Marland KitchenIncision and Drainage Date/Time: 12/13/2017 6:04 PM Performed by: Albesa Seen, PA-C Authorized by: Albesa Seen, PA-C   Consent:    Consent obtained:  Verbal   Consent given by:  Patient   Risks discussed:  Bleeding, incomplete drainage and infection   Alternatives discussed:  No treatment Location:    Type:  Abscess   Location:  Head   Head location:  Scalp Pre-procedure details:    Skin preparation:  Betadine Anesthesia (see MAR for exact dosages):    Anesthesia method:  Local infiltration   Local anesthetic:  Lidocaine 2% WITH epi Procedure type:    Complexity:  Simple Procedure details:    Incision types:  Stab incision   Incision depth:  Dermal   Scalpel blade:  11   Wound  management:  Probed and deloculated and irrigated with saline   Drainage:  Purulent   Drainage amount:  Scant   Wound treatment:  Wound left open   Packing materials:  None Post-procedure details:    Patient tolerance of procedure:  Tolerated well, no immediate complications   (including critical care time)  Medications Ordered in ED Medications  lidocaine-EPINEPHrine (XYLOCAINE W/EPI) 2 %-1:200000 (PF) injection 10 mL (not administered)     Initial Impression / Assessment and Plan / ED Course  I have reviewed the triage vital signs and the nursing notes.  Pertinent labs & imaging results that were available during my care of the patient were reviewed by me and considered in my medical decision making (see chart for details).      Final Clinical Impressions(s) / ED Diagnoses   Final diagnoses:  Abscess of scalp   Patient is nontoxic-appearing, afebrile, and in distress.  Patient exhibits a small abscess on the hairline.  This is amenable to I&D.  I&D performed and scant purulence expressed.  Instructed patient on performing warm compresses.  Patient to finish a course of doxycycline for treatment.  Return precautions given for any erythema, or redemonstration of abscess.  Patient to follow-up with a wound check with her primary care provider later this week.  Patient is understanding and agrees with the plan of care.  ED Discharge Orders        Ordered    doxycycline (VIBRAMYCIN) 100 MG capsule  2 times daily     12/13/17 1729       Tamala Julian 12/13/17 Marica Otter, MD 12/14/17 1341

## 2017-12-13 NOTE — Discharge Instructions (Addendum)
Please see the information and instructions below regarding your visit.  Your diagnoses today include:  1. Abscess     Abscesses form when an infection in your skin starts to collect bacteria and white blood cells, walling it off from the rest of your body to protect you from a bigger infection. Risk factors for this type of infection include:  ?Break in the skin ?Diabetes ?Swollen areas  Sometimes the infection starts to spread to surrounding tissue, causing redness and swelling. We call this cellulitis.   Tests performed today include: See side panel of your discharge paperwork for testing performed today. Vital signs are listed at the bottom of these instructions.   Medications prescribed:    Take any prescribed medications only as prescribed, and any over the counter medications only as directed on the packaging.  1. Antibiotic. Doxycycline. This can make you sensitive to the sun, so I recommend wearing a hat or sunscreen if you will be out in the sun for long periods of time.  2. Pain. I recommend taking Tylenol for pain. You may take 650 mg every 6 hours as needed. Do not exceed 4 g of Tylenol in one day.  Home care instructions:  Please follow any educational materials contained in this packet.   Some things that may promote healing of your wound and infection include:  Keep the infected area clean and dry. You can take a shower or bath, but be sure to pat the area dry with a towel afterward. Do not put any antibiotic ointments or creams on the area. Reapply a dry gauze dressing any time the bandage has become soaked with drainage, or after cleansing the wound.  Apply warm compresses to the wound 3-4 times daily to encourage drainage.   Return instructions:  Please return to the Emergency Department if you experience worsening symptoms. You should return for reevaluation of your infection if you notice spreading redness, increased swelling, an abscess develops, or you  develop signs and symptoms of a systemic illness such as fever and chills.  Please return if you have any other emergent concerns.  Additional Information:   Your vital signs today were: BP (!) 157/85 (BP Location: Right Arm)    Pulse 72    Temp 98.5 F (36.9 C) (Oral)    Resp 18    Ht 5\' 1"  (1.549 m)    Wt 86.2 kg (190 lb)    SpO2 100%    BMI 35.90 kg/m  If your blood pressure (BP) was elevated on multiple readings during this visit above 130 for the top number or above 80 for the bottom number, please have this repeated by your primary care provider within one month. --------------  Thank you for allowing [place] to participate in your care today.

## 2018-02-23 DIAGNOSIS — H6690 Otitis media, unspecified, unspecified ear: Secondary | ICD-10-CM | POA: Diagnosis not present

## 2018-02-23 DIAGNOSIS — H6990 Unspecified Eustachian tube disorder, unspecified ear: Secondary | ICD-10-CM | POA: Diagnosis not present

## 2018-02-23 DIAGNOSIS — H8143 Vertigo of central origin, bilateral: Secondary | ICD-10-CM | POA: Diagnosis not present

## 2018-03-14 ENCOUNTER — Emergency Department (HOSPITAL_COMMUNITY): Payer: Medicare Other

## 2018-03-14 ENCOUNTER — Emergency Department (HOSPITAL_COMMUNITY)
Admission: EM | Admit: 2018-03-14 | Discharge: 2018-03-14 | Disposition: A | Discharge status: Home / Self Care | Payer: Medicare Other | Attending: Emergency Medicine | Admitting: Emergency Medicine

## 2018-03-14 ENCOUNTER — Other Ambulatory Visit: Payer: Self-pay

## 2018-03-14 ENCOUNTER — Encounter (HOSPITAL_COMMUNITY): Payer: Self-pay | Admitting: Emergency Medicine

## 2018-03-14 DIAGNOSIS — Z7982 Long term (current) use of aspirin: Secondary | ICD-10-CM | POA: Insufficient documentation

## 2018-03-14 DIAGNOSIS — I10 Essential (primary) hypertension: Secondary | ICD-10-CM | POA: Diagnosis not present

## 2018-03-14 DIAGNOSIS — R52 Pain, unspecified: Secondary | ICD-10-CM | POA: Diagnosis not present

## 2018-03-14 DIAGNOSIS — I2510 Atherosclerotic heart disease of native coronary artery without angina pectoris: Secondary | ICD-10-CM | POA: Insufficient documentation

## 2018-03-14 DIAGNOSIS — Z79899 Other long term (current) drug therapy: Secondary | ICD-10-CM | POA: Insufficient documentation

## 2018-03-14 DIAGNOSIS — R1032 Left lower quadrant pain: Secondary | ICD-10-CM | POA: Insufficient documentation

## 2018-03-14 DIAGNOSIS — R0781 Pleurodynia: Secondary | ICD-10-CM | POA: Diagnosis not present

## 2018-03-14 DIAGNOSIS — R109 Unspecified abdominal pain: Secondary | ICD-10-CM

## 2018-03-14 LAB — URINALYSIS, ROUTINE W REFLEX MICROSCOPIC
Bilirubin Urine: NEGATIVE
Glucose, UA: NEGATIVE mg/dL
Hgb urine dipstick: NEGATIVE
Ketones, ur: NEGATIVE mg/dL
Nitrite: NEGATIVE
Protein, ur: NEGATIVE mg/dL
Specific Gravity, Urine: 1.004 — ABNORMAL LOW (ref 1.005–1.030)
pH: 7 (ref 5.0–8.0)

## 2018-03-14 LAB — I-STAT CHEM 8, ED
BUN: 10 mg/dL (ref 6–20)
Calcium, Ion: 1.21 mmol/L (ref 1.15–1.40)
Chloride: 105 mmol/L (ref 101–111)
Creatinine, Ser: 0.8 mg/dL (ref 0.44–1.00)
Glucose, Bld: 87 mg/dL (ref 65–99)
HCT: 41 % (ref 36.0–46.0)
Hemoglobin: 13.9 g/dL (ref 12.0–15.0)
Potassium: 3.9 mmol/L (ref 3.5–5.1)
Sodium: 142 mmol/L (ref 135–145)
TCO2: 25 mmol/L (ref 22–32)

## 2018-03-14 MED ORDER — PREDNISONE 50 MG PO TABS: 50.0000 mg | ORAL_TABLET | Freq: Every day | ORAL | 0 refills | Status: DC

## 2018-03-14 MED ORDER — NITROFURANTOIN MONOHYD MACRO 100 MG PO CAPS: 100.0000 mg | ORAL_CAPSULE | Freq: Two times a day (BID) | ORAL | 0 refills | Status: DC

## 2018-03-14 MED ORDER — TRAMADOL HCL 50 MG PO TABS: 50.0000 mg | ORAL_TABLET | Freq: Four times a day (QID) | ORAL | 0 refills | Status: DC | PRN

## 2018-03-14 NOTE — ED Notes (Signed)
Merry Proud, [place] in to see pt at triage.

## 2018-03-14 NOTE — ED Provider Notes (Signed)
Patient placed in Quick Look pathway, seen and evaluated   Chief Complaint:   HPI:    62 year old female presents today with complaints of left side pain.  Patient notes that she suffered a fall on the ice in March 2018.  She notes since that time she has had left lateral rib and side pain.  She notes she is been seen by her primary care provider and was told that she "does not have any protein in her kidney".  She is concerned about what this means.  She notes pain has remained constant worse with movement and palpation.  She denies any abdominal pain, denies any lower back pain.   ROS: Side pain (one)  Physical Exam:   Gen: No distress  Neuro: Awake and Alert  Skin: Warm    Focused Exam: Tenderness palpation of left lateral ribs, very minor, lung expansion normal, abdomen soft nontender no midline lower back pain   Initiation of care has begun. The patient has been counseled on the process, plan, and necessity for staying for the completion/evaluation, and the remainder of the medical screening examination     Okey Regal, Hershal Coria 03/14/18 1543    Gareth Morgan, MD 03/15/18 [phone]

## 2018-03-14 NOTE — Discharge Instructions (Addendum)
Return here as needed.  Follow-up with your primary doctor.  Use ice and heat over the area that is sore

## 2018-03-14 NOTE — ED Triage Notes (Signed)
C/o L side pain since falling in ice March 2018.  Reports foul smelling urine x 2 weeks.

## 2018-03-14 NOTE — ED Provider Notes (Signed)
[facility] EMERGENCY DEPARTMENT Provider Note   CSN: [id number] Arrival date & time: 03/14/18  1451     History   Chief Complaint Chief Complaint  Patient presents with  . side pain    HPI Carmesha Carnevale is a 62 y.o. female.  HPI Patient presents to the emergency department with left side pain following a fall that occurred 1-1/2 years ago.  Patient states that she was seen by her primary doctor following the fall and the x-rays did not show any abnormalities she states the pain is been off and on since that time.  Patient states that nothing seems to make the condition better palpation of the area makes the pain worse.  The patient denies chest pain, shortness of breath, headache,blurred vision, neck pain, fever, cough, weakness, numbness, dizziness, anorexia, edema, abdominal pain, nausea, vomiting, diarrhea, rash,dysuria, hematemesis, bloody stool, near syncope, or syncope. Past Medical History:  Diagnosis Date  . Anxiety   . CAD (coronary artery disease)   . Hypertension   . Obesity   . Vertigo     Patient Active Problem List   Diagnosis Date Noted  . Sinusitis, acute maxillary 12/30/2016  . Dizziness 12/30/2016  . Sinusitis 12/30/2016  . Proteinuria 05/29/2016  . Dysuria 04/03/2016  . Soft tissue lesion of foot 04/03/2016  . Obesity   . Vaginal discharge 10/02/2015  . Other bursal cyst, left ankle and foot 07/19/2015  . Anxiety and depression 05/04/2015  . LEUKOCYTOSIS 10/31/2010  . HYPOKALEMIA 09/18/2010  . GERD 09/18/2010  . TRANSAMINASES, SERUM, ELEVATED 04/08/2010  . DEPRESSION, PROLONGED 12/04/2009  . ANEMIA 11/07/2009  . CHEST PAIN, ATYPICAL 03/28/2009  . SEDIMENTATION RATE, ELEVATED 01/05/2009  . ABSCESS, TOOTH 12/27/2008  . HEPATIC CYST 12/25/2008  . Migraine 05/27/2007  . HYPERLIPIDEMIA 03/10/2007  . CORONARY ARTERY DISEASE 02/27/2007  . ANXIETY DISORDER, GENERALIZED 10/12/2006  . VERTIGO, BENIGN PAROXYSMAL POSITION 10/12/2006    . Essential hypertension 10/12/2006  . DENTAL CARIES 10/12/2006  . INSOMNIA 10/12/2006    Past Surgical History:  Procedure Laterality Date  . TUBAL LIGATION       OB History   None      Home Medications    Prior to Admission medications   Medication Sig Start Date End Date Taking? Authorizing Provider  acetaminophen-codeine (TYLENOL #3) 300-30 MG tablet Take 1 tablet by mouth every 4 (four) hours as needed for moderate pain. 01/14/17   Golden Circle, FNP  albuterol (PROVENTIL HFA;VENTOLIN HFA) 108 (90 BASE) MCG/ACT inhaler Inhale 1-2 puffs into the lungs every 6 (six) hours as needed for wheezing or shortness of breath.    [provider]  amLODipine (NORVASC) 10 MG tablet Take 1 tablet (10 mg total) by mouth every morning. 02/11/17   Golden Circle, FNP  amoxicillin (AMOXIL) 500 MG capsule Take 1 capsule (500 mg total) by mouth 3 (three) times daily. Patient not taking: Reported on 10/27/2017 01/14/17   Binnie Rail, MD  amoxicillin-clavulanate (AUGMENTIN) 875-125 MG tablet Take 1 tablet by mouth 2 (two) times daily. Patient not taking: Reported on 10/27/2017 02/04/17   Golden Circle, FNP  aspirin 81 MG chewable tablet Chew 81 mg by mouth daily.    [provider]  butalbital-acetaminophen-caffeine (FIORICET WITH CODEINE) 50-325-40-30 MG capsule Take 1 capsule by mouth 2 (two) times daily as needed for headache. Patient not taking: Reported on 10/27/2017 11/11/16   Golden Circle, FNP  carvedilol (COREG) 6.25 MG tablet TAKE 1 TABLET BY MOUTH  TWO  TIMES DAILY WITH A MEAL 11/11/16   Golden Circle, FNP  Cholecalciferol (VITAMIN D PO) Take 1 tablet by mouth daily.    [provider]  clonazePAM (KLONOPIN) 1 MG tablet take 1 TABLET BY MOUTH TWICE DAILY Patient not taking: Reported on 10/27/2017 02/12/17   Golden Circle, FNP  DULoxetine (CYMBALTA) 30 MG capsule Take 1 capsule (30 mg total) by mouth daily. Patient not taking: Reported on  10/27/2017 02/11/17   Golden Circle, FNP  fluticasone [facility]) 50 MCG/ACT nasal spray Place 2 sprays into both nostrils daily. 01/14/17   Binnie Rail, MD  guaifenesin (ROBITUSSIN) 100 MG/5ML syrup Take 200 mg by mouth 3 (three) times daily as needed for cough.    [provider]  omeprazole (PRILOSEC) 20 MG capsule TAKE 1 CAPSULE BY MOUTH  DAILY BEFORE BREAKFAST 11/11/16   Golden Circle, FNP  potassium chloride (K-DUR) 10 MEQ tablet Take 1 tablet (10 mEq total) by mouth daily. 01/20/17   Golden Circle, FNP  SYMBICORT 80-4.5 MCG/ACT inhaler Use 2 puffs TWICE DAILY (120/4=30) 01/20/17   Golden Circle, FNP    Family History Family History  Problem Relation Age of Onset  . Kidney disease Mother   . Seizures Mother   . Heart failure Father   . Colon cancer Neg Hx   . Breast cancer Neg Hx     Social History Social History   Tobacco Use  . Smoking status: Never Smoker  . Smokeless tobacco: Never Used  Substance Use Topics  . Alcohol use: No  . Drug use: No     Allergies   Codeine; Other; Azithromycin; and Ciprofloxacin   Review of Systems Review of Systems All other systems negative except as documented in the HPI. All pertinent positives and negatives as reviewed in the HPI.  Physical Exam Updated Vital Signs BP (!) 150/88 (BP Location: Right Arm)   Pulse 70   Temp 98.2 F (36.8 C) (Oral)   Resp 14   Ht 5\' 1"  (1.549 m)   Wt 87.5 kg (193 lb)   SpO2 100%   BMI 36.47 kg/m   Physical Exam  Constitutional: She is oriented to person, place, and time. She appears well-developed and well-nourished. No distress.  HENT:  Head: Normocephalic and atraumatic.  Mouth/Throat: Oropharynx is clear and moist.  Eyes: Pupils are equal, round, and reactive to light.  Neck: Normal range of motion. Neck supple.  Cardiovascular: Normal rate, regular rhythm and normal heart sounds. Exam reveals no gallop and no friction rub.  No murmur heard. Pulmonary/Chest:  Effort normal and breath sounds normal. No respiratory distress. She has no wheezes.        Abdominal: Soft. Bowel sounds are normal. She exhibits no distension. There is no tenderness.  Neurological: She is alert and oriented to person, place, and time. She exhibits normal muscle tone. Coordination normal.  Skin: Skin is warm and dry. Capillary refill takes less than 2 seconds. No rash noted. No erythema.  Psychiatric: She has a normal mood and affect. Her behavior is normal.  Nursing note and vitals reviewed.    ED Treatments / Results  Labs (all labs ordered are listed, but only abnormal results are displayed) Labs Reviewed  URINALYSIS, ROUTINE W REFLEX MICROSCOPIC - Abnormal; Notable for the following components:      Result Value   APPearance HAZY (*)    Specific Gravity, Urine 1.004 (*)    Leukocytes, UA SMALL (*)  Bacteria, UA RARE (*)    Squamous Epithelial / LPF 6-30 (*)    All other components within normal limits  I-STAT CHEM 8, ED    EKG None  Radiology Dg Ribs Unilateral W/chest Left  Result Date: 03/14/2018 CLINICAL DATA:  Patient fell 1 year ago.  Left axillary pain. EXAM: LEFT RIBS AND CHEST - 3+ VIEW COMPARISON:  December 27, 2016 FINDINGS: No fracture or other bone lesions are seen involving the ribs. There is no evidence of pneumothorax or pleural effusion. Both lungs are clear. Heart size and mediastinal contours are within normal limits. IMPRESSION: Negative. Electronically Signed   By: Dorise Bullion III M.D   On: 03/14/2018 16:08    Procedures Procedures (including critical care time)  Medications Ordered in ED Medications - No data to display   Initial Impression / Assessment and Plan / ED Course  I have reviewed the triage vital signs and the nursing notes.  Pertinent labs & imaging results that were available during my care of the patient were reviewed by me and considered in my medical decision making (see chart for details).      Patient has had this off and on pain over the last year and a half since this fall.  The patient states that it does seem to get better at times patient is advised he will need to follow-up with her primary doctor is the immediately to some further workup on this issue I did advise her that we will treat her with pain control and anti-inflammatory type medication.  She also will be treated for mild UTI based on her urinalysis.  Patient agrees with plan and all questions were answered  Final Clinical Impressions(s) / ED Diagnoses   Final diagnoses:  None    ED Discharge Orders    None       Dalia Heading, PA-C 03/15/18 0013    Malvin Johns, MD 03/15/18 1606

## 2018-03-22 DIAGNOSIS — R42 Dizziness and giddiness: Secondary | ICD-10-CM | POA: Diagnosis not present

## 2018-03-22 DIAGNOSIS — H8110 Benign paroxysmal vertigo, unspecified ear: Secondary | ICD-10-CM | POA: Diagnosis not present

## 2018-03-22 DIAGNOSIS — H9192 Unspecified hearing loss, left ear: Secondary | ICD-10-CM | POA: Diagnosis not present

## 2018-04-19 ENCOUNTER — Other Ambulatory Visit: Payer: Self-pay | Admitting: Nurse Practitioner

## 2018-04-19 DIAGNOSIS — Z1231 Encounter for screening mammogram for malignant neoplasm of breast: Secondary | ICD-10-CM

## 2018-05-03 DIAGNOSIS — G441 Vascular headache, not elsewhere classified: Secondary | ICD-10-CM | POA: Diagnosis not present

## 2018-05-03 DIAGNOSIS — Z79899 Other long term (current) drug therapy: Secondary | ICD-10-CM | POA: Diagnosis not present

## 2018-05-03 DIAGNOSIS — M5442 Lumbago with sciatica, left side: Secondary | ICD-10-CM | POA: Diagnosis not present

## 2018-05-03 DIAGNOSIS — J449 Chronic obstructive pulmonary disease, unspecified: Secondary | ICD-10-CM | POA: Diagnosis not present

## 2018-05-03 DIAGNOSIS — M545 Low back pain: Secondary | ICD-10-CM | POA: Diagnosis not present

## 2018-05-03 DIAGNOSIS — I10 Essential (primary) hypertension: Secondary | ICD-10-CM | POA: Diagnosis not present

## 2018-05-10 DIAGNOSIS — M545 Low back pain: Secondary | ICD-10-CM | POA: Diagnosis not present

## 2018-05-20 DIAGNOSIS — M545 Low back pain: Secondary | ICD-10-CM | POA: Diagnosis not present

## 2018-05-24 DIAGNOSIS — M4807 Spinal stenosis, lumbosacral region: Secondary | ICD-10-CM | POA: Diagnosis not present

## 2018-05-31 ENCOUNTER — Ambulatory Visit
Admission: RE | Admit: 2018-05-31 | Discharge: 2018-05-31 | Disposition: A | Discharge status: Home / Self Care | Payer: Medicare Other | Source: Ambulatory Visit | Attending: Nurse Practitioner | Admitting: Nurse Practitioner

## 2018-05-31 DIAGNOSIS — Z1231 Encounter for screening mammogram for malignant neoplasm of breast: Secondary | ICD-10-CM

## 2018-06-01 DIAGNOSIS — M545 Low back pain: Secondary | ICD-10-CM | POA: Diagnosis not present

## 2018-06-01 DIAGNOSIS — M5416 Radiculopathy, lumbar region: Secondary | ICD-10-CM | POA: Diagnosis not present

## 2018-06-03 DIAGNOSIS — J449 Chronic obstructive pulmonary disease, unspecified: Secondary | ICD-10-CM | POA: Diagnosis not present

## 2018-06-04 DIAGNOSIS — M48061 Spinal stenosis, lumbar region without neurogenic claudication: Secondary | ICD-10-CM | POA: Diagnosis not present

## 2018-07-01 DIAGNOSIS — M48061 Spinal stenosis, lumbar region without neurogenic claudication: Secondary | ICD-10-CM | POA: Diagnosis not present

## 2018-07-03 DIAGNOSIS — J449 Chronic obstructive pulmonary disease, unspecified: Secondary | ICD-10-CM | POA: Diagnosis not present

## 2018-07-16 DIAGNOSIS — M48061 Spinal stenosis, lumbar region without neurogenic claudication: Secondary | ICD-10-CM | POA: Diagnosis not present

## 2018-08-03 DIAGNOSIS — I10 Essential (primary) hypertension: Secondary | ICD-10-CM | POA: Diagnosis not present

## 2018-08-03 DIAGNOSIS — E782 Mixed hyperlipidemia: Secondary | ICD-10-CM | POA: Diagnosis not present

## 2018-08-03 DIAGNOSIS — R5382 Chronic fatigue, unspecified: Secondary | ICD-10-CM | POA: Diagnosis not present

## 2018-08-03 DIAGNOSIS — M4727 Other spondylosis with radiculopathy, lumbosacral region: Secondary | ICD-10-CM | POA: Diagnosis not present

## 2018-08-03 DIAGNOSIS — J449 Chronic obstructive pulmonary disease, unspecified: Secondary | ICD-10-CM | POA: Diagnosis not present

## 2018-08-03 DIAGNOSIS — M545 Low back pain: Secondary | ICD-10-CM | POA: Diagnosis not present

## 2018-08-03 DIAGNOSIS — Z79899 Other long term (current) drug therapy: Secondary | ICD-10-CM | POA: Diagnosis not present

## 2018-09-03 DIAGNOSIS — J449 Chronic obstructive pulmonary disease, unspecified: Secondary | ICD-10-CM | POA: Diagnosis not present

## 2018-09-04 DIAGNOSIS — M5416 Radiculopathy, lumbar region: Secondary | ICD-10-CM | POA: Diagnosis not present

## 2018-09-08 ENCOUNTER — Other Ambulatory Visit: Payer: Self-pay | Admitting: Orthopedic Surgery

## 2018-09-14 ENCOUNTER — Encounter (HOSPITAL_COMMUNITY): Payer: Self-pay

## 2018-09-14 ENCOUNTER — Encounter (HOSPITAL_COMMUNITY)
Admission: RE | Admit: 2018-09-14 | Discharge: 2018-09-14 | Disposition: A | Discharge status: Home / Self Care | Payer: Medicare Other | Source: Ambulatory Visit | Attending: Orthopedic Surgery | Admitting: Orthopedic Surgery

## 2018-09-14 ENCOUNTER — Other Ambulatory Visit: Payer: Self-pay

## 2018-09-14 DIAGNOSIS — K219 Gastro-esophageal reflux disease without esophagitis: Secondary | ICD-10-CM | POA: Diagnosis not present

## 2018-09-14 DIAGNOSIS — Z8249 Family history of ischemic heart disease and other diseases of the circulatory system: Secondary | ICD-10-CM | POA: Diagnosis not present

## 2018-09-14 DIAGNOSIS — M48061 Spinal stenosis, lumbar region without neurogenic claudication: Secondary | ICD-10-CM | POA: Diagnosis not present

## 2018-09-14 DIAGNOSIS — Z841 Family history of disorders of kidney and ureter: Secondary | ICD-10-CM | POA: Diagnosis not present

## 2018-09-14 DIAGNOSIS — Z7982 Long term (current) use of aspirin: Secondary | ICD-10-CM | POA: Diagnosis not present

## 2018-09-14 DIAGNOSIS — Z01812 Encounter for preprocedural laboratory examination: Secondary | ICD-10-CM | POA: Insufficient documentation

## 2018-09-14 DIAGNOSIS — R9431 Abnormal electrocardiogram [ECG] [EKG]: Secondary | ICD-10-CM | POA: Insufficient documentation

## 2018-09-14 DIAGNOSIS — M5416 Radiculopathy, lumbar region: Secondary | ICD-10-CM | POA: Diagnosis not present

## 2018-09-14 DIAGNOSIS — Z7951 Long term (current) use of inhaled steroids: Secondary | ICD-10-CM | POA: Diagnosis not present

## 2018-09-14 DIAGNOSIS — Z881 Allergy status to other antibiotic agents status: Secondary | ICD-10-CM | POA: Diagnosis not present

## 2018-09-14 DIAGNOSIS — Z01810 Encounter for preprocedural cardiovascular examination: Secondary | ICD-10-CM

## 2018-09-14 DIAGNOSIS — Z820 Family history of epilepsy and other diseases of the nervous system: Secondary | ICD-10-CM | POA: Diagnosis not present

## 2018-09-14 DIAGNOSIS — I10 Essential (primary) hypertension: Secondary | ICD-10-CM

## 2018-09-14 DIAGNOSIS — I2510 Atherosclerotic heart disease of native coronary artery without angina pectoris: Secondary | ICD-10-CM

## 2018-09-14 DIAGNOSIS — J449 Chronic obstructive pulmonary disease, unspecified: Secondary | ICD-10-CM | POA: Insufficient documentation

## 2018-09-14 DIAGNOSIS — M4316 Spondylolisthesis, lumbar region: Secondary | ICD-10-CM | POA: Diagnosis not present

## 2018-09-14 DIAGNOSIS — Z79899 Other long term (current) drug therapy: Secondary | ICD-10-CM | POA: Insufficient documentation

## 2018-09-14 DIAGNOSIS — D649 Anemia, unspecified: Secondary | ICD-10-CM | POA: Diagnosis not present

## 2018-09-14 HISTORY — DX: Unspecified osteoarthritis, unspecified site: M19.90

## 2018-09-14 HISTORY — DX: Chronic obstructive pulmonary disease, unspecified: J44.9

## 2018-09-14 HISTORY — DX: Depression, unspecified: F32.A

## 2018-09-14 HISTORY — DX: Headache: R51

## 2018-09-14 HISTORY — DX: Spinal stenosis, lumbar region without neurogenic claudication: M48.061

## 2018-09-14 HISTORY — DX: Headache, unspecified: R51.9

## 2018-09-14 HISTORY — DX: Major depressive disorder, single episode, unspecified: F32.9

## 2018-09-14 HISTORY — DX: Presence of spectacles and contact lenses: Z97.3

## 2018-09-14 HISTORY — DX: Spondylolisthesis, lumbar region: M43.16

## 2018-09-14 LAB — CBC WITH DIFFERENTIAL/PLATELET
Abs Immature Granulocytes: 0 10*3/uL (ref 0.0–0.1)
Basophils Absolute: 0 10*3/uL (ref 0.0–0.1)
Basophils Relative: 1 %
Eosinophils Absolute: 0.1 10*3/uL (ref 0.0–0.7)
Eosinophils Relative: 3 %
HCT: 42.7 % (ref 36.0–46.0)
Hemoglobin: 13.3 g/dL (ref 12.0–15.0)
Immature Granulocytes: 0 %
Lymphocytes Relative: 35 %
Lymphs Abs: 1.3 10*3/uL (ref 0.7–4.0)
MCH: 27 pg (ref 26.0–34.0)
MCHC: 31.1 g/dL (ref 30.0–36.0)
MCV: 86.6 fL (ref 78.0–100.0)
Monocytes Absolute: 0.4 10*3/uL (ref 0.1–1.0)
Monocytes Relative: 12 %
Neutro Abs: 1.8 10*3/uL (ref 1.7–7.7)
Neutrophils Relative %: 49 %
Platelets: 247 10*3/uL (ref 150–400)
RBC: 4.93 MIL/uL (ref 3.87–5.11)
RDW: 13.4 % (ref 11.5–15.5)
WBC: 3.7 10*3/uL — ABNORMAL LOW (ref 4.0–10.5)

## 2018-09-14 LAB — COMPREHENSIVE METABOLIC PANEL
ALT: 19 U/L (ref 0–44)
AST: 29 U/L (ref 15–41)
Albumin: 4.1 g/dL (ref 3.5–5.0)
Alkaline Phosphatase: 92 U/L (ref 38–126)
Anion gap: 9 (ref 5–15)
BUN: 6 mg/dL — ABNORMAL LOW (ref 8–23)
CO2: 26 mmol/L (ref 22–32)
Calcium: 9.7 mg/dL (ref 8.9–10.3)
Chloride: 106 mmol/L (ref 98–111)
Creatinine, Ser: 0.85 mg/dL (ref 0.44–1.00)
GFR calc Af Amer: >60 mL/min (ref >60)
GFR calc non Af Amer: >60 mL/min (ref >60)
Glucose, Bld: 92 mg/dL (ref 70–99)
Potassium: 3.2 mmol/L — ABNORMAL LOW (ref 3.5–5.1)
Sodium: 141 mmol/L (ref 135–145)
Total Bilirubin: 0.6 mg/dL (ref 0.3–1.2)
Total Protein: 8.1 g/dL (ref 6.5–8.1)

## 2018-09-14 LAB — APTT: aPTT: 28 seconds (ref 24–36)

## 2018-09-14 LAB — URINALYSIS, ROUTINE W REFLEX MICROSCOPIC
Bilirubin Urine: NEGATIVE
Glucose, UA: NEGATIVE mg/dL
Hgb urine dipstick: NEGATIVE
Ketones, ur: NEGATIVE mg/dL
Leukocytes, UA: NEGATIVE
Nitrite: NEGATIVE
Protein, ur: NEGATIVE mg/dL
Specific Gravity, Urine: 1.003 — ABNORMAL LOW (ref 1.005–1.030)
pH: 7 (ref 5.0–8.0)

## 2018-09-14 LAB — PROTIME-INR
INR: 1.05
Prothrombin Time: 13.6 seconds (ref 11.4–15.2)

## 2018-09-14 LAB — ABO/RH: ABO/RH(D): A POS

## 2018-09-14 LAB — SURGICAL PCR SCREEN
MRSA, PCR: NEGATIVE
Staphylococcus aureus: NEGATIVE

## 2018-09-14 LAB — TYPE AND SCREEN
ABO/RH(D): A POS
Antibody Screen: NEGATIVE

## 2018-09-14 NOTE — Pre-Procedure Instructions (Addendum)
   Taite Baldassari  09/14/2018    [place], [place] - [address] [place] [address] Phone: [phone] Fax: [place], [place], [place] [address] Phone: [phone] Fax: [phone]   Your procedure is scheduled on Thursday, September 16, 2018.  Report to [facility] Admitting at 9:00 A.M. (per MD)  Call this number if you have problems the morning of surgery:  [phone]   Remember:  Do not eat or drink after midnight Wednesday, September 15, 2018  Take these medicines the morning of surgery with A SIP OF WATER : amLODipine (NORVASC), carvedilol (COREG), clonazePAM (KLONOPIN), gabapentin (NEURONTIN),  omeprazole (PRILOSEC), SYMBICORT  Inhaler If needed: Tylenol #3 for pain albuterol (PROVENTIL)  Nebulizer for wheezing or shortness of breath, albuterol (PROVENTIL HFA;VENTOLIN HFA) 108 (90 BASE) MCG/ACT inhaler for wheezing or shortness of breath ( Bring inhaler in with you on day of surgery) Stop taking Aspirin (unless advised otherwise by your surgeon), vitamins, fish oil and herbal medications. Do not take any NSAIDs ie: Ibuprofen, Advil, Naproxen (Aleve), Motrin, diclofenac (VOLTAREN), BC and Goody Powder; stop now  Do not wear jewelry, make-up or nail polish.  Do not wear lotions, powders, or perfumes, or deodorant.  Do not shave 48 hours prior to surgery.   Do not bring valuables to the hospital.  [facility] is not responsible for any belongings or valuables.  Contacts, dentures or bridgework may not be worn into surgery.  Leave your suitcase in the car.  After surgery it may be brought to your room. Patients discharged the day of surgery will not be allowed to drive home.  Special instructions:Shower the night before and morning of surgery with CHG. Please read over the following fact sheets that you were  given. Pain Booklet, Coughing and Deep Breathing, MRSA Information and Surgical Site Infection Prevention

## 2018-09-14 NOTE — Progress Notes (Signed)
Pt denies SOB, chest pain, and being under the care of a cardiologist. Pt stated that she has not seen a cardiologist since 2011 when an echo was performed. Pt stated that she had both a cardiac cath and echo > 10 years ago. Pt denies having an EKG and chest x ray within the last year. Pt denies recent labs. Pt to contact surgeon for pre-op Aspirin instructions. Pt PCP is Selina Cooley, NP, at [facility] ( [address]). Pt chart forwarded to anesthesia for review of cardiac history.

## 2018-09-15 NOTE — Progress Notes (Addendum)
Anesthesia Chart Review:  Case:  [id number] Date/Time:  09/16/18 1153   Procedure:  LEFT-SIDED LUMBAR 4-5 TRANSFORAMINAL LUMBAR INTERBODY FUSION WITH INSTRUMENTATION AND ALLOGRAFT (Left )   Anesthesia type:  General   Pre-op diagnosis:  LEFT LEG PAIN SECONDARY TO SPINAL STENOSIS AND A SPONDYLOLISTHESIS AT L4-L5   Location:  [facility] OR ROOM 05 / [place] OR   Surgeon:  Phylliss Bob, MD      DISCUSSION: 62 yo female never smoker. Pertinent hx includes HTN, Anxiety, Depression, COPD, ?CAD.  Review of records in [place] shows an admission July 2011 for chest pain. Per discharge summary troponin negative x 3. She did have subtle increase in t wave inversions in V2-V4 and cards consulted. Cath performed that showed no significant coronary disease and normal EF. She also had an echo at that time showing EF 55-60%, normal wall motion, grade 1dd, mild pulm htn.   Per pt she does not currently follow with cardiology. Denies any cardiopulmonary complaints. Denies CP. Anticipate she can proceed as planned barring acute status change.  VS: BP 137/81   Pulse 67   Temp 36.9 C   Resp 20   Ht 5' (1.524 m)   Wt 84.7 kg   SpO2 97%   BMI 36.46 kg/m   PROVIDERS: Vonna Drafts, FNP is PCP   LABS: Labs reviewed: Acceptable for surgery. (all labs ordered are listed, but only abnormal results are displayed)  Labs Reviewed  CBC WITH DIFFERENTIAL/PLATELET - Abnormal; Notable for the following components:      Result Value   WBC 3.7 (*)    All other components within normal limits  COMPREHENSIVE METABOLIC PANEL - Abnormal; Notable for the following components:   Potassium 3.2 (*)    BUN 6 (*)    All other components within normal limits  URINALYSIS, ROUTINE W REFLEX MICROSCOPIC - Abnormal; Notable for the following components:   Color, Urine STRAW (*)    Specific Gravity, Urine 1.003 (*)    All other components within normal limits  SURGICAL PCR SCREEN  APTT  PROTIME-INR  TYPE AND SCREEN  ABO/RH      IMAGES: CHEST  2 VIEW 12/27/2016  COMPARISON:  CT chest 11/29/2012. Chest x-rays 11/05/2012, 02/22/2011 and earlier.  FINDINGS: Cardiac silhouette upper normal in size to slightly enlarged but stable. Thoracic aorta tortuous, unchanged. Hilar and mediastinal contours otherwise unremarkable. Linear atelectasis in both lower lobes. Prominent bronchovascular markings diffusely and moderate central peribronchial thickening, more so than on the prior examinations. No confluent airspace consolidation. No pleural effusions. Mild degenerative changes involving the thoracic spine and thoracolumbar dextroscoliosis.  IMPRESSION: Moderate changes of acute bronchitis and/or asthma without focal airspace pneumonia. Mild linear atelectasis in the lower lobes.    EKG: 09/14/18: Normal sinus rhythm. Nonspecific T wave abnormality. No significant change.  CV: Cath 06/13/2010: Impression: 1.  Normal left ventricular function. 2.  No significant coronary obstructive disease with the midportion of the left anterior descending dipping intramyocardially without evidence for fixed obstruction or significant muscle bridging.  Echo 06/12/2010: Study conclusions: Left ventricle: The cavity size is normal.  Wall thickness was increased in a pattern of mild LVH.  There is mild concentric hypertrophy.  Systolic function was normal.  The estimated ejection fraction was in the range of 55 to 60%.  Wall motion was normal; there was no regional wall motion abnormality.  Doppler parameters are consistent with a normal left ventricular relaxation (grade 1 diastolic function). Aortic valve: Trileaflet; mildly thickened, mildly calcified leaflets.  Mild regurgitation. Lateral valve: Calcified annulus.  Mild regurgitation. Right ventricle: Systolic pressure was increased. Atrial septum: No defect repeat foraminal valve was identified. Tricuspid valve: Mild-moderate regurgitation. Pulmonary arteries: PA peak  pressure: 35 mmHg. Impressions: The right ventricular systolic pressure was increased consistent with mild pulmonary hypertension.  Past Medical History:  Diagnosis Date  . Anxiety   . Arthritis   . CAD (coronary artery disease)   . COPD (chronic obstructive pulmonary disease) ([place])   . Depression   . Headache    migraines  . Hypertension   . Lumbar spinal stenosis   . Obesity   . Spondylolisthesis at L4-L5 level   . Vertigo   . Wears glasses     Past Surgical History:  Procedure Laterality Date  . CARDIAC CATHETERIZATION    . COLONOSCOPY W/ BIOPSIES AND POLYPECTOMY    . MULTIPLE TOOTH EXTRACTIONS    . TUBAL LIGATION      MEDICATIONS: . acetaminophen-codeine (TYLENOL #3) 300-30 MG tablet  . albuterol (PROVENTIL HFA;VENTOLIN HFA) 108 (90 BASE) MCG/ACT inhaler  . albuterol (PROVENTIL) (2.5 MG/3ML) 0.083% nebulizer solution  . amLODipine (NORVASC) 10 MG tablet  . aspirin 81 MG chewable tablet  . BUPAP 50-300 MG TABS  . carvedilol (COREG) 6.25 MG tablet  . Cholecalciferol (VITAMIN D PO)  . clonazePAM (KLONOPIN) 1 MG tablet  . diclofenac (VOLTAREN) 75 MG EC tablet  . diclofenac sodium (VOLTAREN) 1 % GEL  . DULoxetine (CYMBALTA) 30 MG capsule  . fluticasone (FLONASE) 50 MCG/ACT nasal spray  . gabapentin (NEURONTIN) 300 MG capsule  . methocarbamol (ROBAXIN) 500 MG tablet  . omeprazole (PRILOSEC) 20 MG capsule  . potassium chloride (K-DUR) 10 MEQ tablet  . rosuvastatin (CRESTOR) 10 MG tablet  . SYMBICORT 80-4.5 MCG/ACT inhaler  . traMADol (ULTRAM) 50 MG tablet   No current facility-administered medications for this encounter.      Wynonia Musty [facility]/Anesthesiology Phone [phone] 09/15/2018 10:05 AM

## 2018-09-16 ENCOUNTER — Inpatient Hospital Stay (HOSPITAL_COMMUNITY)
Admission: RE | Admit: 2018-09-16 | Discharge: 2018-09-17 | DRG: 460 | Disposition: A | Discharge status: Home / Self Care | Payer: Medicare Other | Source: Ambulatory Visit | Attending: Orthopedic Surgery | Admitting: Orthopedic Surgery

## 2018-09-16 ENCOUNTER — Inpatient Hospital Stay (HOSPITAL_COMMUNITY): Payer: Medicare Other | Admitting: Anesthesiology

## 2018-09-16 ENCOUNTER — Inpatient Hospital Stay (HOSPITAL_COMMUNITY): Payer: Medicare Other | Admitting: Physician Assistant

## 2018-09-16 ENCOUNTER — Inpatient Hospital Stay (HOSPITAL_COMMUNITY): Payer: Medicare Other

## 2018-09-16 ENCOUNTER — Encounter (HOSPITAL_COMMUNITY): Payer: Self-pay

## 2018-09-16 ENCOUNTER — Inpatient Hospital Stay (HOSPITAL_COMMUNITY)
Admission: RE | Disposition: A | Discharge status: Home / Self Care | Payer: Self-pay | Source: Ambulatory Visit | Attending: Orthopedic Surgery

## 2018-09-16 DIAGNOSIS — M4316 Spondylolisthesis, lumbar region: Secondary | ICD-10-CM | POA: Diagnosis present

## 2018-09-16 DIAGNOSIS — Z7951 Long term (current) use of inhaled steroids: Secondary | ICD-10-CM | POA: Diagnosis not present

## 2018-09-16 DIAGNOSIS — Z841 Family history of disorders of kidney and ureter: Secondary | ICD-10-CM

## 2018-09-16 DIAGNOSIS — I10 Essential (primary) hypertension: Secondary | ICD-10-CM | POA: Diagnosis present

## 2018-09-16 DIAGNOSIS — M4326 Fusion of spine, lumbar region: Secondary | ICD-10-CM | POA: Diagnosis not present

## 2018-09-16 DIAGNOSIS — Z6836 Body mass index (BMI) 36.0-36.9, adult: Secondary | ICD-10-CM | POA: Diagnosis not present

## 2018-09-16 DIAGNOSIS — M5416 Radiculopathy, lumbar region: Secondary | ICD-10-CM | POA: Diagnosis not present

## 2018-09-16 DIAGNOSIS — I2510 Atherosclerotic heart disease of native coronary artery without angina pectoris: Secondary | ICD-10-CM | POA: Diagnosis not present

## 2018-09-16 DIAGNOSIS — Z881 Allergy status to other antibiotic agents status: Secondary | ICD-10-CM

## 2018-09-16 DIAGNOSIS — K219 Gastro-esophageal reflux disease without esophagitis: Secondary | ICD-10-CM | POA: Diagnosis present

## 2018-09-16 DIAGNOSIS — Z419 Encounter for procedure for purposes other than remedying health state, unspecified: Secondary | ICD-10-CM

## 2018-09-16 DIAGNOSIS — M4800 Spinal stenosis, site unspecified: Secondary | ICD-10-CM | POA: Diagnosis present

## 2018-09-16 DIAGNOSIS — Z8249 Family history of ischemic heart disease and other diseases of the circulatory system: Secondary | ICD-10-CM | POA: Diagnosis not present

## 2018-09-16 DIAGNOSIS — M79605 Pain in left leg: Secondary | ICD-10-CM | POA: Diagnosis present

## 2018-09-16 DIAGNOSIS — D649 Anemia, unspecified: Secondary | ICD-10-CM | POA: Diagnosis present

## 2018-09-16 DIAGNOSIS — J449 Chronic obstructive pulmonary disease, unspecified: Secondary | ICD-10-CM | POA: Diagnosis present

## 2018-09-16 DIAGNOSIS — Z820 Family history of epilepsy and other diseases of the nervous system: Secondary | ICD-10-CM | POA: Diagnosis not present

## 2018-09-16 DIAGNOSIS — M48061 Spinal stenosis, lumbar region without neurogenic claudication: Secondary | ICD-10-CM | POA: Diagnosis not present

## 2018-09-16 DIAGNOSIS — Z7982 Long term (current) use of aspirin: Secondary | ICD-10-CM

## 2018-09-16 HISTORY — PX: TRANSFORAMINAL LUMBAR INTERBODY FUSION (TLIF) WITH PEDICLE SCREW FIXATION 1 LEVEL: SHX6141

## 2018-09-16 SURGERY — TRANSFORAMINAL LUMBAR INTERBODY FUSION (TLIF) WITH PEDICLE SCREW FIXATION 1 LEVEL
Anesthesia: General | Site: Spine Lumbar | Laterality: Left

## 2018-09-16 MED ORDER — FLUTICASONE PROPIONATE 50 MCG/ACT NA SUSP
2.0000 | Freq: Every day | NASAL | Status: DC
  Administered 2018-09-16 – 2018-09-17 (×2): 2 via NASAL
  Filled 2018-09-16: qty 16

## 2018-09-16 MED ORDER — PROPOFOL 10 MG/ML IV BOLUS
INTRAVENOUS | Status: DC | PRN
  Administered 2018-09-16: 120 mg via INTRAVENOUS
  Administered 2018-09-16: 30 mg via INTRAVENOUS

## 2018-09-16 MED ORDER — ALBUTEROL SULFATE (2.5 MG/3ML) 0.083% IN NEBU: 2.5000 mg | INHALATION_SOLUTION | RESPIRATORY_TRACT | Status: DC | PRN

## 2018-09-16 MED ORDER — PHENYLEPHRINE 40 MCG/ML (10ML) SYRINGE FOR IV PUSH (FOR BLOOD PRESSURE SUPPORT)
PREFILLED_SYRINGE | INTRAVENOUS | Status: AC
  Filled 2018-09-16: qty 10

## 2018-09-16 MED ORDER — EPHEDRINE SULFATE-NACL 50-0.9 MG/10ML-% IV SOSY
PREFILLED_SYRINGE | INTRAVENOUS | Status: DC | PRN
  Administered 2018-09-16 (×3): 5 mg via INTRAVENOUS

## 2018-09-16 MED ORDER — OXYCODONE HCL 5 MG/5ML PO SOLN: 5.0000 mg | Freq: Once | ORAL | Status: AC | PRN

## 2018-09-16 MED ORDER — METHOCARBAMOL 500 MG PO TABS
500.0000 mg | ORAL_TABLET | Freq: Three times a day (TID) | ORAL | Status: DC | PRN
  Administered 2018-09-16 – 2018-09-17 (×2): 500 mg via ORAL
  Filled 2018-09-16: qty 1

## 2018-09-16 MED ORDER — ROCURONIUM BROMIDE 50 MG/5ML IV SOSY
PREFILLED_SYRINGE | INTRAVENOUS | Status: AC
  Filled 2018-09-16: qty 5

## 2018-09-16 MED ORDER — SODIUM CHLORIDE 0.9 % IV SOLN
INTRAVENOUS | Status: DC | PRN
  Administered 2018-09-16: 50 ug/min via INTRAVENOUS
  Administered 2018-09-16: 80 ug/min via INTRAVENOUS

## 2018-09-16 MED ORDER — ONDANSETRON HCL 4 MG/2ML IJ SOLN
INTRAMUSCULAR | Status: DC | PRN
  Administered 2018-09-16: 4 mg via INTRAVENOUS

## 2018-09-16 MED ORDER — FENTANYL CITRATE (PF) 100 MCG/2ML IJ SOLN
INTRAMUSCULAR | Status: AC
  Filled 2018-09-16: qty 2

## 2018-09-16 MED ORDER — PHENYLEPHRINE 40 MCG/ML (10ML) SYRINGE FOR IV PUSH (FOR BLOOD PRESSURE SUPPORT): PREFILLED_SYRINGE | INTRAVENOUS | Status: DC | PRN

## 2018-09-16 MED ORDER — CLONAZEPAM 1 MG PO TABS
1.0000 mg | ORAL_TABLET | Freq: Two times a day (BID) | ORAL | Status: DC
  Administered 2018-09-17: 1 mg via ORAL
  Filled 2018-09-16 (×2): qty 1

## 2018-09-16 MED ORDER — DULOXETINE HCL 30 MG PO CPEP
30.0000 mg | ORAL_CAPSULE | Freq: Every day | ORAL | Status: DC
  Administered 2018-09-16 – 2018-09-17 (×2): 30 mg via ORAL
  Filled 2018-09-16: qty 1

## 2018-09-16 MED ORDER — CARVEDILOL 6.25 MG PO TABS
6.2500 mg | ORAL_TABLET | Freq: Two times a day (BID) | ORAL | Status: DC
  Administered 2018-09-16: 6.25 mg via ORAL
  Filled 2018-09-16: qty 1

## 2018-09-16 MED ORDER — ACETAMINOPHEN 325 MG PO TABS: 650.0000 mg | ORAL_TABLET | ORAL | Status: DC | PRN

## 2018-09-16 MED ORDER — CEFAZOLIN SODIUM-DEXTROSE 1-4 GM/50ML-% IV SOLN
1.0000 g | Freq: Three times a day (TID) | INTRAVENOUS | Status: AC
  Administered 2018-09-16: 1 g via INTRAVENOUS
  Filled 2018-09-16 (×2): qty 50

## 2018-09-16 MED ORDER — OXYCODONE HCL 5 MG PO TABS
ORAL_TABLET | ORAL | Status: AC
  Filled 2018-09-16: qty 1

## 2018-09-16 MED ORDER — DIAZEPAM 5 MG PO TABS
5.0000 mg | ORAL_TABLET | Freq: Four times a day (QID) | ORAL | Status: DC | PRN
  Administered 2018-09-17: 5 mg via ORAL
  Filled 2018-09-16: qty 1

## 2018-09-16 MED ORDER — ONDANSETRON HCL 4 MG PO TABS: 4.0000 mg | ORAL_TABLET | Freq: Four times a day (QID) | ORAL | Status: DC | PRN

## 2018-09-16 MED ORDER — MIDAZOLAM HCL 2 MG/2ML IJ SOLN
INTRAMUSCULAR | Status: AC
  Filled 2018-09-16: qty 2

## 2018-09-16 MED ORDER — POTASSIUM CHLORIDE IN NACL 20-0.9 MEQ/L-% IV SOLN
INTRAVENOUS | Status: DC
  Administered 2018-09-16 – 2018-09-17 (×2): via INTRAVENOUS
  Filled 2018-09-16 (×2): qty 1000

## 2018-09-16 MED ORDER — BUPIVACAINE-EPINEPHRINE 0.25% -1:200000 IJ SOLN
INTRAMUSCULAR | Status: DC | PRN
  Administered 2018-09-16: 20 mL
  Administered 2018-09-16: 9 mL

## 2018-09-16 MED ORDER — ONDANSETRON HCL 4 MG/2ML IJ SOLN: 4.0000 mg | Freq: Four times a day (QID) | INTRAMUSCULAR | Status: DC | PRN

## 2018-09-16 MED ORDER — DEXAMETHASONE SODIUM PHOSPHATE 10 MG/ML IJ SOLN
INTRAMUSCULAR | Status: AC
  Filled 2018-09-16: qty 1

## 2018-09-16 MED ORDER — OXYCODONE HCL 5 MG PO TABS
5.0000 mg | ORAL_TABLET | Freq: Once | ORAL | Status: AC | PRN
  Administered 2018-09-16: 5 mg via ORAL

## 2018-09-16 MED ORDER — FENTANYL CITRATE (PF) 250 MCG/5ML IJ SOLN
INTRAMUSCULAR | Status: AC
  Filled 2018-09-16: qty 5

## 2018-09-16 MED ORDER — PROPOFOL 500 MG/50ML IV EMUL
INTRAVENOUS | Status: DC | PRN
  Administered 2018-09-16: 75 ug/kg/min via INTRAVENOUS

## 2018-09-16 MED ORDER — DICLOFENAC SODIUM 75 MG PO TBEC
75.0000 mg | DELAYED_RELEASE_TABLET | Freq: Two times a day (BID) | ORAL | Status: DC
  Administered 2018-09-16 – 2018-09-17 (×2): 75 mg via ORAL
  Filled 2018-09-16 (×3): qty 1

## 2018-09-16 MED ORDER — LIDOCAINE 2% (20 MG/ML) 5 ML SYRINGE
INTRAMUSCULAR | Status: DC | PRN
  Administered 2018-09-16: 80 mg via INTRAVENOUS

## 2018-09-16 MED ORDER — PANTOPRAZOLE SODIUM 40 MG PO TBEC
40.0000 mg | DELAYED_RELEASE_TABLET | Freq: Every day | ORAL | Status: DC
  Administered 2018-09-17: 40 mg via ORAL
  Filled 2018-09-16: qty 1

## 2018-09-16 MED ORDER — AMLODIPINE BESYLATE 10 MG PO TABS
10.0000 mg | ORAL_TABLET | Freq: Every morning | ORAL | Status: DC
  Administered 2018-09-17: 10 mg via ORAL
  Filled 2018-09-16: qty 1

## 2018-09-16 MED ORDER — BUPIVACAINE LIPOSOME 1.3 % IJ SUSP
20.0000 mL | INTRAMUSCULAR | Status: DC
  Filled 2018-09-16: qty 20

## 2018-09-16 MED ORDER — SODIUM CHLORIDE 0.9 % IV SOLN: 250.0000 mL | INTRAVENOUS | Status: DC

## 2018-09-16 MED ORDER — ZOLPIDEM TARTRATE 5 MG PO TABS: 5.0000 mg | ORAL_TABLET | Freq: Every evening | ORAL | Status: DC | PRN

## 2018-09-16 MED ORDER — MENTHOL 3 MG MT LOZG: 1.0000 | LOZENGE | OROMUCOSAL | Status: DC | PRN

## 2018-09-16 MED ORDER — SUCCINYLCHOLINE CHLORIDE 200 MG/10ML IV SOSY
PREFILLED_SYRINGE | INTRAVENOUS | Status: AC
  Filled 2018-09-16: qty 10

## 2018-09-16 MED ORDER — CEFAZOLIN SODIUM-DEXTROSE 2-4 GM/100ML-% IV SOLN
2.0000 g | INTRAVENOUS | Status: AC
  Administered 2018-09-16: 2 g via INTRAVENOUS
  Filled 2018-09-16: qty 100

## 2018-09-16 MED ORDER — DEXAMETHASONE SODIUM PHOSPHATE 10 MG/ML IJ SOLN
INTRAMUSCULAR | Status: DC | PRN
  Administered 2018-09-16: 5 mg via INTRAVENOUS

## 2018-09-16 MED ORDER — BUPIVACAINE-EPINEPHRINE (PF) 0.25% -1:200000 IJ SOLN
INTRAMUSCULAR | Status: AC
  Filled 2018-09-16: qty 30

## 2018-09-16 MED ORDER — LACTATED RINGERS IV SOLN
INTRAVENOUS | Status: DC | PRN
  Administered 2018-09-16 (×2): via INTRAVENOUS

## 2018-09-16 MED ORDER — CARVEDILOL 3.125 MG PO TABS
ORAL_TABLET | ORAL | Status: AC
  Filled 2018-09-16: qty 2

## 2018-09-16 MED ORDER — SODIUM CHLORIDE 0.9% FLUSH: 3.0000 mL | INTRAVENOUS | Status: DC | PRN

## 2018-09-16 MED ORDER — THROMBIN 20000 UNITS EX SOLR
CUTANEOUS | Status: DC | PRN
  Administered 2018-09-16: 20 mL via TOPICAL

## 2018-09-16 MED ORDER — FENTANYL CITRATE (PF) 100 MCG/2ML IJ SOLN
25.0000 ug | INTRAMUSCULAR | Status: DC | PRN
  Administered 2018-09-16 (×2): 25 ug via INTRAVENOUS
  Administered 2018-09-16: 50 ug via INTRAVENOUS

## 2018-09-16 MED ORDER — SUCCINYLCHOLINE CHLORIDE 20 MG/ML IJ SOLN
INTRAMUSCULAR | Status: DC | PRN
  Administered 2018-09-16: 120 mg via INTRAVENOUS

## 2018-09-16 MED ORDER — THROMBIN (RECOMBINANT) 20000 UNITS EX SOLR
CUTANEOUS | Status: AC
  Filled 2018-09-16: qty 20000

## 2018-09-16 MED ORDER — PROPOFOL 10 MG/ML IV BOLUS
INTRAVENOUS | Status: AC
  Filled 2018-09-16: qty 20

## 2018-09-16 MED ORDER — 0.9 % SODIUM CHLORIDE (POUR BTL) OPTIME
TOPICAL | Status: DC | PRN
  Administered 2018-09-16 (×3): 1000 mL

## 2018-09-16 MED ORDER — ACETAMINOPHEN 650 MG RE SUPP: 650.0000 mg | RECTAL | Status: DC | PRN

## 2018-09-16 MED ORDER — FENTANYL CITRATE (PF) 100 MCG/2ML IJ SOLN
INTRAMUSCULAR | Status: DC | PRN
  Administered 2018-09-16 (×2): 50 ug via INTRAVENOUS
  Administered 2018-09-16: 100 ug via INTRAVENOUS

## 2018-09-16 MED ORDER — ONDANSETRON HCL 4 MG/2ML IJ SOLN
INTRAMUSCULAR | Status: AC
  Filled 2018-09-16: qty 2

## 2018-09-16 MED ORDER — METHYLENE BLUE 0.5 % INJ SOLN
INTRAVENOUS | Status: AC
  Filled 2018-09-16: qty 10

## 2018-09-16 MED ORDER — CARVEDILOL 6.25 MG PO TABS
6.2500 mg | ORAL_TABLET | Freq: Two times a day (BID) | ORAL | Status: DC
  Administered 2018-09-17 (×2): 6.25 mg via ORAL
  Filled 2018-09-16 (×2): qty 1

## 2018-09-16 MED ORDER — MOMETASONE FURO-FORMOTEROL FUM 100-5 MCG/ACT IN AERO
2.0000 | INHALATION_SPRAY | Freq: Two times a day (BID) | RESPIRATORY_TRACT | Status: DC
  Administered 2018-09-16 – 2018-09-17 (×2): 2 via RESPIRATORY_TRACT
  Filled 2018-09-16: qty 8.8

## 2018-09-16 MED ORDER — SUGAMMADEX SODIUM 200 MG/2ML IV SOLN
INTRAVENOUS | Status: DC | PRN
  Administered 2018-09-16: 150 mg via INTRAVENOUS

## 2018-09-16 MED ORDER — BUPIVACAINE LIPOSOME 1.3 % IJ SUSP
INTRAMUSCULAR | Status: DC | PRN
  Administered 2018-09-16: 20 mL

## 2018-09-16 MED ORDER — METHYLENE BLUE 0.5 % INJ SOLN
INTRAVENOUS | Status: DC | PRN
  Administered 2018-09-16: .5 mL via INTRADERMAL

## 2018-09-16 MED ORDER — SODIUM CHLORIDE 0.9% FLUSH
3.0000 mL | Freq: Two times a day (BID) | INTRAVENOUS | Status: DC
  Administered 2018-09-16 – 2018-09-17 (×2): 3 mL via INTRAVENOUS

## 2018-09-16 MED ORDER — METHOCARBAMOL 500 MG PO TABS
ORAL_TABLET | ORAL | Status: AC
  Filled 2018-09-16: qty 1

## 2018-09-16 MED ORDER — ONDANSETRON HCL 4 MG/2ML IJ SOLN: 4.0000 mg | Freq: Once | INTRAMUSCULAR | Status: DC | PRN

## 2018-09-16 MED ORDER — ACETAMINOPHEN 10 MG/ML IV SOLN
1000.0000 mg | INTRAVENOUS | Status: AC
  Administered 2018-09-16: 1000 mg via INTRAVENOUS
  Filled 2018-09-16: qty 100

## 2018-09-16 MED ORDER — ALBUTEROL SULFATE HFA 108 (90 BASE) MCG/ACT IN AERS: 1.0000 | INHALATION_SPRAY | Freq: Four times a day (QID) | RESPIRATORY_TRACT | Status: DC | PRN

## 2018-09-16 MED ORDER — ACETAMINOPHEN 325 MG PO TABS: 325.0000 mg | ORAL_TABLET | ORAL | Status: DC | PRN

## 2018-09-16 MED ORDER — MEPERIDINE HCL 50 MG/ML IJ SOLN: 6.2500 mg | INTRAMUSCULAR | Status: DC | PRN

## 2018-09-16 MED ORDER — EPHEDRINE 5 MG/ML INJ
INTRAVENOUS | Status: AC
  Filled 2018-09-16: qty 10

## 2018-09-16 MED ORDER — POTASSIUM CHLORIDE ER 10 MEQ PO TBCR
10.0000 meq | EXTENDED_RELEASE_TABLET | Freq: Every day | ORAL | Status: DC
  Administered 2018-09-16 – 2018-09-17 (×2): 10 meq via ORAL
  Filled 2018-09-16 (×3): qty 1

## 2018-09-16 MED ORDER — MIDAZOLAM HCL 5 MG/5ML IJ SOLN
INTRAMUSCULAR | Status: DC | PRN
  Administered 2018-09-16: 2 mg via INTRAVENOUS

## 2018-09-16 MED ORDER — GABAPENTIN 300 MG PO CAPS
300.0000 mg | ORAL_CAPSULE | Freq: Three times a day (TID) | ORAL | Status: DC
  Administered 2018-09-16 – 2018-09-17 (×3): 300 mg via ORAL
  Filled 2018-09-16 (×3): qty 1

## 2018-09-16 MED ORDER — LIDOCAINE 2% (20 MG/ML) 5 ML SYRINGE
INTRAMUSCULAR | Status: AC
  Filled 2018-09-16: qty 5

## 2018-09-16 MED ORDER — ROCURONIUM BROMIDE 10 MG/ML (PF) SYRINGE
PREFILLED_SYRINGE | INTRAVENOUS | Status: DC | PRN
  Administered 2018-09-16: 40 mg via INTRAVENOUS

## 2018-09-16 MED ORDER — ALUM & MAG HYDROXIDE-SIMETH 200-200-20 MG/5ML PO SUSP: 30.0000 mL | Freq: Four times a day (QID) | ORAL | Status: DC | PRN

## 2018-09-16 MED ORDER — ROSUVASTATIN CALCIUM 10 MG PO TABS
10.0000 mg | ORAL_TABLET | Freq: Every day | ORAL | Status: DC
  Administered 2018-09-17: 10 mg via ORAL
  Filled 2018-09-16: qty 1

## 2018-09-16 MED ORDER — OXYCODONE-ACETAMINOPHEN 5-325 MG PO TABS
1.0000 | ORAL_TABLET | ORAL | Status: DC | PRN
  Administered 2018-09-16 – 2018-09-17 (×5): 2 via ORAL
  Filled 2018-09-16 (×6): qty 2

## 2018-09-16 MED ORDER — POVIDONE-IODINE 7.5 % EX SOLN
Freq: Once | CUTANEOUS | Status: DC
  Filled 2018-09-16: qty 118

## 2018-09-16 MED ORDER — ACETAMINOPHEN 160 MG/5ML PO SOLN: 325.0000 mg | ORAL | Status: DC | PRN

## 2018-09-16 MED ORDER — PHENOL 1.4 % MT LIQD: 1.0000 | OROMUCOSAL | Status: DC | PRN

## 2018-09-16 SURGICAL SUPPLY — 83 items
BENZOIN TINCTURE PRP APPL 2/3 (GAUZE/BANDAGES/DRESSINGS) ×2 IMPLANT
BLADE CLIPPER SURG (BLADE) IMPLANT
BONE VIVIGEN FORMABLE 10CC (Bone Implant) ×2 IMPLANT
BUR PRESCISION 1.7 ELITE (BURR) ×2 IMPLANT
BUR ROUND FLUTED 5 RND (BURR) ×2 IMPLANT
BUR ROUND PRECISION 4.0 (BURR) IMPLANT
BUR SABER RD CUTTING 3.0 (BURR) IMPLANT
CAGE CONCORDE BULLET 9X11X23 (Cage) ×1 IMPLANT
CAGE SPNL PRLL BLT NOSE 23X9 (Cage) ×1 IMPLANT
CARTRIDGE OIL MAESTRO DRILL (MISCELLANEOUS) ×1 IMPLANT
CONT SPEC 4OZ CLIKSEAL STRL BL (MISCELLANEOUS) IMPLANT
COVER MAYO STAND STRL (DRAPES) ×2 IMPLANT
COVER SURGICAL LIGHT HANDLE (MISCELLANEOUS) ×2 IMPLANT
DIFFUSER DRILL AIR PNEUMATIC (MISCELLANEOUS) ×2 IMPLANT
DRAIN CHANNEL 15F RND FF W/TCR (WOUND CARE) IMPLANT
DRAPE C-ARM 42X72 X-RAY (DRAPES) ×2 IMPLANT
DRAPE C-ARMOR (DRAPES) ×2 IMPLANT
DRAPE POUCH INSTRU U-SHP 10X18 (DRAPES) ×2 IMPLANT
DRAPE SURG 17X23 STRL (DRAPES) ×8 IMPLANT
DURAPREP 26ML APPLICATOR (WOUND CARE) ×2 IMPLANT
ELECT BLADE 4.0 EZ CLEAN MEGAD (MISCELLANEOUS) ×2
ELECT CAUTERY BLADE 6.4 (BLADE) ×2 IMPLANT
ELECT REM PT RETURN 9FT ADLT (ELECTROSURGICAL) ×2
ELECTRODE BLDE 4.0 EZ CLN MEGD (MISCELLANEOUS) ×1 IMPLANT
ELECTRODE REM PT RTRN 9FT ADLT (ELECTROSURGICAL) ×1 IMPLANT
EVACUATOR SILICONE 100CC (DRAIN) IMPLANT
FEE INTRAOP MONITOR IMPULS NCS (MISCELLANEOUS) ×1 IMPLANT
FILTER STRAW FLUID ASPIR (MISCELLANEOUS) IMPLANT
GAUZE 4X4 16PLY RFD (DISPOSABLE) ×2 IMPLANT
GAUZE SPONGE 4X4 12PLY STRL (GAUZE/BANDAGES/DRESSINGS) ×2 IMPLANT
GLOVE BIO SURGEON STRL SZ7 (GLOVE) ×6 IMPLANT
GLOVE BIO SURGEON STRL SZ8 (GLOVE) ×6 IMPLANT
GLOVE BIOGEL PI IND STRL 7.0 (GLOVE) ×4 IMPLANT
GLOVE BIOGEL PI IND STRL 8 (GLOVE) ×2 IMPLANT
GLOVE BIOGEL PI INDICATOR 7.0 (GLOVE) ×4
GLOVE BIOGEL PI INDICATOR 8 (GLOVE) ×2
GLOVE SURG SS PI 8.0 STRL IVOR (GLOVE) ×4 IMPLANT
GOWN STRL REUS W/ TWL LRG LVL3 (GOWN DISPOSABLE) ×3 IMPLANT
GOWN STRL REUS W/ TWL XL LVL3 (GOWN DISPOSABLE) ×2 IMPLANT
GOWN STRL REUS W/TWL 2XL LVL3 (GOWN DISPOSABLE) ×2 IMPLANT
GOWN STRL REUS W/TWL LRG LVL3 (GOWN DISPOSABLE) ×3
GOWN STRL REUS W/TWL XL LVL3 (GOWN DISPOSABLE) ×2
INTRAOP MONITOR FEE IMPULS NCS (MISCELLANEOUS) ×1
INTRAOP MONITOR FEE IMPULSE (MISCELLANEOUS) ×1
IV CATH 14GX2 1/4 (CATHETERS) ×2 IMPLANT
KIT BASIN OR (CUSTOM PROCEDURE TRAY) ×2 IMPLANT
KIT POSITION SURG JACKSON T1 (MISCELLANEOUS) ×2 IMPLANT
KIT TURNOVER KIT B (KITS) ×2 IMPLANT
MARKER SKIN DUAL TIP RULER LAB (MISCELLANEOUS) ×2 IMPLANT
NDL SAFETY ECLIPSE 18X1.5 (NEEDLE) ×1 IMPLANT
NEEDLE 22X1 1/2 (OR ONLY) (NEEDLE) ×2 IMPLANT
NEEDLE HYPO 18GX1.5 SHARP (NEEDLE) ×1
NEEDLE HYPO 25GX1X1/2 BEV (NEEDLE) ×2 IMPLANT
NEEDLE SPNL 18GX3.5 QUINCKE PK (NEEDLE) ×4 IMPLANT
NS IRRIG 1000ML POUR BTL (IV SOLUTION) ×2 IMPLANT
OIL CARTRIDGE MAESTRO DRILL (MISCELLANEOUS) ×2
PACK LAMINECTOMY ORTHO (CUSTOM PROCEDURE TRAY) ×2 IMPLANT
PACK UNIVERSAL I (CUSTOM PROCEDURE TRAY) ×2 IMPLANT
PAD ARMBOARD 7.5X6 YLW CONV (MISCELLANEOUS) ×4 IMPLANT
PATTIES SURGICAL .5 X1 (DISPOSABLE) ×2 IMPLANT
PATTIES SURGICAL .5X1.5 (GAUZE/BANDAGES/DRESSINGS) IMPLANT
PROBE PEDCLE PROBE MAGSTM DISP (MISCELLANEOUS) ×2 IMPLANT
ROD PRE BENT EXP 40MM (Rod) ×4 IMPLANT
SCREW SET SINGLE INNER (Screw) ×8 IMPLANT
SCREW VIPER CORT FIX 6.00X30 (Screw) ×4 IMPLANT
SCREW VIPER CORT FIX 6X35 (Screw) ×4 IMPLANT
SPONGE INTESTINAL PEANUT (DISPOSABLE) ×2 IMPLANT
SPONGE SURGIFOAM ABS GEL 100 (HEMOSTASIS) ×2 IMPLANT
STRIP CLOSURE SKIN 1/2X4 (GAUZE/BANDAGES/DRESSINGS) ×2 IMPLANT
SURGIFLO W/THROMBIN 8M KIT (HEMOSTASIS) IMPLANT
SUT MNCRL AB 4-0 PS2 18 (SUTURE) ×2 IMPLANT
SUT VIC AB 0 CT1 18XCR BRD 8 (SUTURE) ×1 IMPLANT
SUT VIC AB 0 CT1 8-18 (SUTURE) ×1
SUT VIC AB 1 CT1 18XCR BRD 8 (SUTURE) ×1 IMPLANT
SUT VIC AB 1 CT1 8-18 (SUTURE) ×1
SUT VIC AB 2-0 CT2 18 VCP726D (SUTURE) ×2 IMPLANT
SYR 20CC LL (SYRINGE) ×4 IMPLANT
SYR BULB IRRIGATION 50ML (SYRINGE) ×2 IMPLANT
SYR CONTROL 10ML LL (SYRINGE) ×6 IMPLANT
SYR TB 1ML LUER SLIP (SYRINGE) ×2 IMPLANT
TAPE CLOTH SURG 4X10 WHT LF (GAUZE/BANDAGES/DRESSINGS) ×2 IMPLANT
TRAY FOLEY MTR SLVR 16FR STAT (SET/KITS/TRAYS/PACK) ×2 IMPLANT
YANKAUER SUCT BULB TIP NO VENT (SUCTIONS) ×2 IMPLANT

## 2018-09-16 NOTE — Anesthesia Procedure Notes (Signed)
Procedure Name: Intubation Date/Time: 09/16/2018 11:51 AM Performed by: Jenne Campus, CRNA Pre-anesthesia Checklist: Patient identified, Emergency Drugs available, Suction available and Patient being monitored Patient Re-evaluated:Patient Re-evaluated prior to induction Oxygen Delivery Method: Circle System Utilized Preoxygenation: Pre-oxygenation with 100% oxygen Induction Type: IV induction, Cricoid Pressure applied and Rapid sequence Laryngoscope Size: Miller and 2 Grade View: Grade I Tube type: Oral Tube size: 7.0 mm Number of attempts: 1 Airway Equipment and Method: Stylet and Oral airway Placement Confirmation: ETT inserted through vocal cords under direct vision,  positive ETCO2 and breath sounds checked- equal and bilateral Secured at: 21 cm Tube secured with: Tape Dental Injury: Teeth and Oropharynx as per pre-operative assessment

## 2018-09-16 NOTE — Transfer of Care (Signed)
Immediate Anesthesia Transfer of Care Note  Patient: Catherine [name]  Procedure(s) Performed: LEFT-SIDED LUMBAR 4-5 TRANSFORAMINAL LUMBAR INTERBODY FUSION WITH INSTRUMENTATION AND ALLOGRAFT (Left Spine Lumbar)  Patient Location: PACU  Anesthesia Type:General  Level of Consciousness: oriented, drowsy and patient cooperative  Airway & Oxygen Therapy: Patient Spontanous Breathing and Patient connected to face mask oxygen  Post-op Assessment: Report given to RN and Post -op Vital signs reviewed and stable  Post vital signs: Reviewed  Last Vitals:  Vitals Value Taken Time  BP 110/72 09/16/2018  4:02 PM  Temp    Pulse 73 09/16/2018  4:05 PM  Resp 9 09/16/2018  4:05 PM  SpO2 97 % 09/16/2018  4:05 PM  Vitals shown include unvalidated device data.  Last Pain:  Vitals:   09/16/18 0904  PainSc: 8       Patients Stated Pain Goal: 0 (15/40/08 6761)  Complications: No apparent anesthesia complications

## 2018-09-16 NOTE — Op Note (Signed)
MEDICAL RECORD NO.: [id number]   PHYSICIAN:  Phylliss Bob, MD      DATE OF BIRTH:  1956/04/25   DATE OF PROCEDURE:  09/16/2018                               OPERATIVE REPORT     PREOPERATIVE DIAGNOSES: 1. Left-sided lumbar radiculopathy. 2. L4-5 spinal stenosis. 3. L4-5 spondylolisthesis   POSTOPERATIVE DIAGNOSES: 1. Left-sided lumbar radiculopathy. 2. L4-5 spinal stenosis. 3. L4-5 spondylolisthesis   PROCEDURES: 1. L4/5 decompression 2. Left-sided L4-5 transforaminal lumbar interbody fusion. 3. Right-sided L4-5 posterolateral fusion. 4. Insertion of interbody device x1 (12mm Concorde intervertebral spacer). 5. Placement of segmental posterior instrumentation L4, L5 bilaterally  6. Use of local autograft. 7. Use of morselized allograft - Vivigen 8. Intraoperative use of fluoroscopy.   SURGEON:  Phylliss Bob, MD.   ASSISTANT:  None   ANESTHESIA:  General endotracheal anesthesia.   COMPLICATIONS:  None.   DISPOSITION:  Stable.   ESTIMATED BLOOD LOSS:  50cc   INDICATIONS FOR SURGERY:  Briefly,  Catherine [name] is a pleasant 62 year old female who did present to me with severe and ongoing pain in the leftt leg. I did feel that the symptoms were secondary to the findings noted above.   The patient failed conservative care and did wish to proceed with the procedure  noted above.   OPERATIVE DETAILS:  On  09/16/2018, the patient was brought to surgery and general endotracheal anesthesia was administered.  The patient was placed prone on a well-padded flat Jackson bed with a spinal frame.  Antibiotics were given and a time-out procedure was performed. The back was prepped and draped in the usual fashion.  A midline incision was made overlying the L4-5 intervertebral spaces.  The fascia was incised at the midline.  The paraspinal musculature was bluntly swept laterally.  Anatomic landmarks for the pedicles were exposed. Using fluoroscopy, I did cannulate the L5 pedicles  bilaterally, using a medial to lateral cortical trajectory technique.  Then, on the left side, I did cannulate the L4 and L5 pedicles, also using a medial to lateral technique.  At this point, 6 x 35 mm screws were placed into the right pedicles, and a 40 mm rod was placed into the tulip heads of the screw, and saps were also placed.  Distraction was then applied across the L4-5 intervertebral space, and the caps were then provisionally tightened.  On the left side, bone wax was placed into the cannulated pedicle holes.  I then proceeded with the decompressive aspect of the procedure at the L4-5 level.  A partial facetectomy was performed bilaterally at L4-5, decompressing the L4-5 intervertebral space.  I was very pleased with the decompression. With the assistant holding medial retraction of the traversing left L5 nerve, I did perform an annulotomy at the posterolateral aspect of the L4-5 intervertebral space.  I then used a series of curettes and pituitary rongeurs to perform a thorough and complete intervertebral diskectomy.  The intervertebral space was then liberally packed with autograft as well as allograft in the form of Vivigen, as was the appropriate-sized intervertebral spacer (11 x 23 mm).  The spacer was then tamped into position in the usual fashion.  I was very pleased with the press-fit of the spacer.  I then placed 6 x 11mm mm screws on the left at L4 and L5. A 45-mm rod was then placed and caps were placed. The distraction  was then released on the contralateral right side.  All caps were then locked.  The wound was copiously irrigated with a total of approximately 3 L prior to placing the bone graft.  Additional autograft and allograft was then packed into the posterolateral gutter on the right side to help aid in the success of the fusion.  The wound was  explored for any undue bleeding and there was no substantial bleeding encountered.  Gel-Foam was placed over the laminectomy  site.  The wound was then closed in layers using #1 Vicryl followed by 2-0 Vicryl, followed by 4-0 Monocryl.  Benzoin and Steri-Strips were applied followed by sterile dressing.     Of note, did use triggered EMG to test the screws on the left, and there is no screw the tested below 12 mA. There was no sustained abnormal EMG activity noted throughout the surgery.       Phylliss Bob, MD

## 2018-09-16 NOTE — H&P (Signed)
PREOPERATIVE H&P  Chief Complaint: Left leg pain  HPI: Catherine [name] is a 62 y.o. female who presents with ongoing pain in the left leg  MRI reveals stenosis at L4/5, and xrays reveal a spondylolisthesis   Patient has failed multiple forms of conservative care and continues to have pain (see office notes for additional details regarding the patient's full course of treatment)  Past Medical History:  Diagnosis Date  . Anxiety   . Arthritis   . CAD (coronary artery disease)   . COPD (chronic obstructive pulmonary disease) ([place])   . Depression   . Headache    migraines  . Hypertension   . Lumbar spinal stenosis   . Obesity   . Spondylolisthesis at L4-L5 level   . Vertigo   . Wears glasses    Past Surgical History:  Procedure Laterality Date  . CARDIAC CATHETERIZATION    . COLONOSCOPY W/ BIOPSIES AND POLYPECTOMY    . MULTIPLE TOOTH EXTRACTIONS    . TUBAL LIGATION     Social History   Socioeconomic History  . Marital status: Single    Spouse name: Not on file  . Number of children: 2  . Years of education: 9  . Highest education level: Not on file  Occupational History  . Occupation: Disability    Comment: Heart Disease  Social Needs  . Financial resource strain: Not on file  . Food insecurity:    Worry: Not on file    Inability: Not on file  . Transportation needs:    Medical: Not on file    Non-medical: Not on file  Tobacco Use  . Smoking status: Never Smoker  . Smokeless tobacco: Never Used  Substance and Sexual Activity  . Alcohol use: No  . Drug use: No  . Sexual activity: Yes    Birth control/protection: Surgical  Lifestyle  . Physical activity:    Days per week: Not on file    Minutes per session: Not on file  . Stress: Not on file  Relationships  . Social connections:    Talks on phone: Not on file    Gets together: Not on file    Attends religious service: Not on file    Active member of club or organization: Not on file    Attends  meetings of clubs or organizations: Not on file    Relationship status: Not on file  Other Topics Concern  . Not on file  Social History Narrative   Tends to her mother.   Denies religous beliefs effecting health care.    Family History  Problem Relation Age of Onset  . Kidney disease Mother   . Seizures Mother   . Heart failure Father   . Colon cancer Neg Hx   . Breast cancer Neg Hx    Allergies  Allergen Reactions  . Other Nausea Only and Other (See Comments)    Darvocet  . Azithromycin Nausea And Vomiting  . Ciprofloxacin Itching   Prior to Admission medications   Medication Sig Start Date End Date Taking? Authorizing Provider  acetaminophen-codeine (TYLENOL #3) 300-30 MG tablet Take 1 tablet by mouth every 4 (four) hours as needed for moderate pain. Patient taking differently: Take 1 tablet by mouth every 6 (six) hours as needed for moderate pain.  01/14/17  Yes Golden Circle, FNP  albuterol (PROVENTIL HFA;VENTOLIN HFA) 108 (90 BASE) MCG/ACT inhaler Inhale 1-2 puffs into the lungs every 6 (six) hours as needed for wheezing or  shortness of breath.   Yes [provider]  albuterol (PROVENTIL) (2.5 MG/3ML) 0.083% nebulizer solution Inhale 2.5 mg into the lungs every 4 (four) hours as needed for wheezing or shortness of breath.  06/02/18  Yes [provider]  amLODipine (NORVASC) 10 MG tablet Take 1 tablet (10 mg total) by mouth every morning. 02/11/17  Yes Golden Circle, FNP  aspirin 81 MG chewable tablet Chew 81 mg by mouth daily.   Yes [provider]  BUPAP 50-300 MG TABS Take 1 tablet by mouth every 8 (eight) hours as needed for headache. 09/01/18  Yes [provider]  carvedilol (COREG) 6.25 MG tablet TAKE 1 TABLET BY MOUTH TWO  TIMES DAILY WITH A MEAL Patient taking differently: Take 6.25 mg by mouth 2 (two) times daily with a meal.  11/11/16  Yes Golden Circle, FNP  Cholecalciferol (VITAMIN D PO) Take 1 tablet by mouth daily.   Yes  [provider]  clonazePAM (KLONOPIN) 1 MG tablet take 1 TABLET BY MOUTH TWICE DAILY Patient taking differently: Take 1 mg by mouth 2 (two) times daily.  02/12/17  Yes Golden Circle, FNP  diclofenac (VOLTAREN) 75 MG EC tablet Take 75 mg by mouth 2 (two) times daily.   Yes [provider]  diclofenac sodium (VOLTAREN) 1 % GEL Apply 1 application topically 4 (four) times daily.   Yes [provider]  gabapentin (NEURONTIN) 300 MG capsule Take 300 mg by mouth 3 (three) times daily.   Yes [provider]  methocarbamol (ROBAXIN) 500 MG tablet Take 500 mg by mouth every 8 (eight) hours as needed for muscle spasms.   Yes [provider]  omeprazole (PRILOSEC) 20 MG capsule TAKE 1 CAPSULE BY MOUTH  DAILY BEFORE BREAKFAST Patient taking differently: Take 20 mg by mouth daily.  11/11/16  Yes Golden Circle, FNP  potassium chloride (K-DUR) 10 MEQ tablet Take 1 tablet (10 mEq total) by mouth daily. 01/20/17  Yes Golden Circle, FNP  rosuvastatin (CRESTOR) 10 MG tablet Take 10 mg by mouth daily.   Yes [provider]  SYMBICORT 80-4.5 MCG/ACT inhaler Use 2 puffs TWICE DAILY (120/4=30) Patient taking differently: Inhale 2 puffs into the lungs 2 (two) times daily.  01/20/17  Yes Golden Circle, FNP  DULoxetine (CYMBALTA) 30 MG capsule Take 1 capsule (30 mg total) by mouth daily. Patient not taking: Reported on 10/27/2017 02/11/17   Golden Circle, FNP  fluticasone [facility]) 50 MCG/ACT nasal spray Place 2 sprays into both nostrils daily. Patient not taking: Reported on 09/09/2018 01/14/17   Binnie Rail, MD  traMADol (ULTRAM) 50 MG tablet Take 1 tablet (50 mg total) by mouth every 6 (six) hours as needed for severe pain. Patient not taking: Reported on 09/09/2018 03/14/18   Dalia Heading, PA-C     All other systems have been reviewed and were otherwise negative with the exception of those mentioned in the HPI and as above.  Physical  Exam: There were no vitals filed for this visit.  There is no height or weight on file to calculate BMI.  General: Alert, no acute distress Cardiovascular: No pedal edema Respiratory: No cyanosis, no use of accessory musculature Skin: No lesions in the area of chief complaint Neurologic: Sensation intact distally Psychiatric: Patient is competent for consent with normal mood and affect Lymphatic: No axillary or cervical lymphadenopathy  MUSCULOSKELETAL: + SLR on the left  Assessment/Plan: LEFT LEG PAIN SECONDARY TO SPINAL STENOSIS AND A  SPONDYLOLISTHESIS AT L4-L5 Plan for Procedure(s): LEFT-SIDED LUMBAR 4-5 TRANSFORAMINAL LUMBAR INTERBODY FUSION WITH INSTRUMENTATION AND ALLOGRAFT   Sinclair Ship, MD 09/16/2018 6:43 AM

## 2018-09-16 NOTE — Anesthesia Preprocedure Evaluation (Addendum)
Anesthesia Evaluation  Patient identified by MRN, date of birth, ID band Patient awake    Reviewed: Allergy & Precautions, H&P , NPO status , Patient's Chart, lab work & pertinent test results, reviewed documented beta blocker date and time   Airway Mallampati: II  TM Distance: >3 FB Neck ROM: full    Dental no notable dental hx. (+) Edentulous Upper, Edentulous Lower, Dental Advisory Given   Pulmonary COPD,  COPD inhaler,    Pulmonary exam normal breath sounds clear to auscultation       Cardiovascular Exercise Tolerance: Good hypertension, Pt. on medications and Pt. on home beta blockers + CAD   Rhythm:regular Rate:Normal  EKG: 09/14/18: Normal sinus rhythm. Nonspecific T wave abnormality. No significant change.  Cath 06/13/2010: 1.  Normal left ventricular function. 2.  No significant coronary obstructive disease with the midportion of the left anterior descending dipping intramyocardially without evidence for fixed obstruction or significant muscle bridging.  Echo 06/12/2010: Left ventricle: The cavity size is normal.  Wall thickness was increased in a pattern of mild LVH.  There is mild concentric hypertrophy.  Systolic function was normal.  The estimated ejection fraction was in the range of 55 to 60%.  Wall motion was normal; there was no regional wall motion abnormality.  Doppler parameters are consistent with a normal left ventricular relaxation (grade 1 diastolic function).   Neuro/Psych  Headaches, PSYCHIATRIC DISORDERS Anxiety Depression    GI/Hepatic Neg liver ROS, GERD  Medicated and Poorly Controlled,  Endo/Other  Morbid obesity  Renal/GU negative Renal ROS  negative genitourinary   Musculoskeletal  (+) Arthritis , Osteoarthritis,    Abdominal   Peds  Hematology negative hematology ROS (+) anemia ,   Anesthesia Other Findings   Reproductive/Obstetrics negative OB ROS                            Anesthesia Physical Anesthesia Plan  ASA: III  Anesthesia Plan: General   Post-op Pain Management:    Induction: Intravenous  PONV Risk Score and Plan: 3 and Ondansetron, Dexamethasone and Treatment may vary due to age or medical condition  Airway Management Planned: Oral ETT  Additional Equipment:   Intra-op Plan:   Post-operative Plan: Extubation in OR  Informed Consent: I have reviewed the patients History and Physical, chart, labs and discussed the procedure including the risks, benefits and alternatives for the proposed anesthesia with the patient or authorized representative who has indicated his/her understanding and acceptance.   Dental Advisory Given  Plan Discussed with: CRNA, Anesthesiologist and Surgeon  Anesthesia Plan Comments: (  )        Anesthesia Quick Evaluation

## 2018-09-16 NOTE — Progress Notes (Signed)
Pt on hold-waiting for a bed. Pt does not want her daughter to come into pacu for a visit. Daughter updated thru volunteer.

## 2018-09-17 ENCOUNTER — Other Ambulatory Visit: Payer: Self-pay

## 2018-09-17 MED FILL — Thrombin (Recombinant) For Soln 20000 Unit: CUTANEOUS | Qty: 1 | Status: AC

## 2018-09-17 NOTE — Anesthesia Postprocedure Evaluation (Signed)
Anesthesia Post Note  Patient: Catherine [name]  Procedure(s) Performed: LEFT-SIDED LUMBAR 4-5 TRANSFORAMINAL LUMBAR INTERBODY FUSION WITH INSTRUMENTATION AND ALLOGRAFT (Left Spine Lumbar)     Patient location during evaluation: PACU Anesthesia Type: General Level of consciousness: awake and alert Pain management: pain level controlled Vital Signs Assessment: post-procedure vital signs reviewed and stable Respiratory status: spontaneous breathing, nonlabored ventilation, respiratory function stable and patient connected to nasal cannula oxygen Cardiovascular status: blood pressure returned to baseline and stable Postop Assessment: no apparent nausea or vomiting Anesthetic complications: no    Last Vitals:  Vitals:   09/17/18 1211 09/17/18 1651  BP: 116/66 (!) 95/51  Pulse: 65 68  Resp: 18 18  Temp: 36.8 C 36.6 C  SpO2: 98% 96%    Last Pain:  Vitals:   09/17/18 1651  TempSrc: Oral  PainSc:    Pain Goal: Patients Stated Pain Goal: 0 (09/17/18 0232)               Vikas Wegmann

## 2018-09-17 NOTE — Progress Notes (Addendum)
Spoke with Physician and patient is medically cleared for discharge on today.   Patient's 3 in 1 has not been delivered to her room as of now.   Patient explained about her discharge for today, was not thrilled but educated on being medically stable and cleared for discharge relate to admission need.

## 2018-09-17 NOTE — Evaluation (Signed)
Occupational Therapy Evaluation Patient Details Name: Catherine [name] MRN: [id number] DOB: July 24, 1956 Today's Date: 09/17/2018    History of Present Illness Pt is a 62 y/o female s/p L4/5 decompression and fusion. Pt has a PMH including Anxiety, Arthritis, CAD, COPD, Depression, Headache, Hypertension, Lumbar spinal stenosis, Obesity, Vertigo, and Wears glasses.    Clinical Impression   PTA Pt independent in ADL and mobility. Back handout provided and reviewed adls in detail. Pt educated on: clothing between brace, never sleep in brace, set an alarm at night for medication, avoid sitting for long periods of time, correct bed positioning for sleeping, correct sequence for bed mobility, avoiding lifting more than 5 pounds and never wash directly over incision. TOday Pt required mod A for donning the brace sitting EOB, supervision for transfers and grooming tasks. Poor recall of back precautions and required cues throughout. MOd to max A for LB ADL - daughter present throughout session and plans on assisting mother at dc. OT will follow acutely. Next session to focus on independence donning brace, and bed mobility.      Follow Up Recommendations  Supervision/Assistance - 24 hour(initially)    Equipment Recommendations  3 in 1 bedside commode    Recommendations for Other Services       Precautions / Restrictions Precautions Precautions: Back Precaution Booklet Issued: Yes (comment) Precaution Comments: reviewed back precautions and handout in full Required Braces or Orthoses: Spinal Brace Spinal Brace: Thoracolumbosacral orthotic;Applied in sitting position Restrictions Weight Bearing Restrictions: No      Mobility Bed Mobility Overal bed mobility: Needs Assistance Bed Mobility: Sidelying to Sit   Sidelying to sit: Min guard       General bed mobility comments: use of bed rail to assist with trunk elevation  Transfers Overall transfer level: Needs assistance Equipment used:  None Transfers: Sit to/from Stand Sit to Stand: Min guard         General transfer comment: min guard for safety    Balance Overall balance assessment: Mild deficits observed, not formally tested                                         ADL either performed or assessed with clinical judgement   ADL Overall ADL's : Needs assistance/impaired Eating/Feeding: Independent;Sitting   Grooming: Supervision/safety;Standing;Wash/dry hands;Wash/dry face   Upper Body Bathing: Supervision/ safety;Sitting   Lower Body Bathing: Moderate assistance;With caregiver independent assisting;Sitting/lateral leans Lower Body Bathing Details (indicate cue type and reason): educated in long handle sponge Upper Body Dressing : Moderate assistance;Sitting Upper Body Dressing Details (indicate cue type and reason): to don brace, required verbal step by step instructions Lower Body Dressing: Moderate assistance;Sit to/from stand   Toilet Transfer: Supervision/safety;Ambulation;Min guard(BSC over toilet) Toilet Transfer Details (indicate cue type and reason): initially min guard, progressed to supervision Toileting- Clothing Manipulation and Hygiene: Set up;Sit to/from Nurse, children's Details (indicate cue type and reason): educated on use of 3 in 1 as shower chair; pt declined practicing tub transfer this session Functional mobility during ADLs: Min guard;Supervision/safety(pushing IV pole) General ADL Comments: Educated on compensatory strategies for ADL to maintain back precautions     Vision         Perception     Praxis      Pertinent Vitals/Pain Pain Assessment: 0-10 Pain Score: 5  Pain Location: incision site Pain Descriptors / Indicators: Grimacing;Sore Pain Intervention(s):  Premedicated before session;Repositioned     Hand Dominance Right   Extremity/Trunk Assessment Upper Extremity Assessment Upper Extremity Assessment: Overall WFL for tasks  assessed   Lower Extremity Assessment Lower Extremity Assessment: Defer to PT evaluation   Cervical / Trunk Assessment Cervical / Trunk Assessment: Other exceptions Cervical / Trunk Exceptions: s/p lumbar sx   Communication Communication Communication: No difficulties   Cognition Arousal/Alertness: Awake/alert Behavior During Therapy: WFL for tasks assessed/performed Overall Cognitive Status: Impaired/Different from baseline Area of Impairment: Safety/judgement                         Safety/Judgement: Decreased awareness of safety;Decreased awareness of deficits     General Comments: Pt required verbal cues throughout session for back precautions   General Comments  daughter present throughout session    Exercises     Shoulder Instructions      Home Living Family/patient expects to be discharged to:: Private residence Living Arrangements: Alone;Children(daughter is staying with her) Available Help at Discharge: Family;Available 24 hours/day Type of Home: Apartment Home Access: Level entry     Home Layout: One level     Bathroom Shower/Tub: Tub/shower unit;Curtain   Biochemist, clinical: Standard Bathroom Accessibility: Yes How Accessible: Accessible via walker Home Equipment: None          Prior Functioning/Environment Level of Independence: Independent                 OT Problem List: Decreased activity tolerance;Impaired balance (sitting and/or standing);Decreased safety awareness;Decreased knowledge of use of DME or AE;Decreased knowledge of precautions;Obesity;Pain      OT Treatment/Interventions: Self-care/ADL training;DME and/or AE instruction;Therapeutic activities;Patient/family education;Balance training    OT Goals(Current goals can be found in the care plan section) Acute Rehab OT Goals Patient Stated Goal: "get pain down" OT Goal Formulation: With patient/family Time For Goal Achievement: 10/01/18 Potential to Achieve Goals:  Good ADL Goals Pt Will Perform Lower Body Bathing: with min guard assist;with caregiver independent in assisting;sit to/from stand Pt Will Perform Lower Body Dressing: with min guard assist;with caregiver independent in assisting;sit to/from stand Pt Will Transfer to Toilet: with modified independence;ambulating Pt Will Perform Toileting - Clothing Manipulation and hygiene: with modified independence;sit to/from stand Additional ADL Goal #1: Pt will demonstrate bed mobility prior to/after ADL activity maintaining back precautions at supervision level  OT Frequency: Min 2X/week   Barriers to D/C:            Co-evaluation              AM-PAC PT "6 Clicks" Daily Activity     Outcome Measure Help from another person eating meals?: None Help from another person taking care of personal grooming?: None Help from another person toileting, which includes using toliet, bedpan, or urinal?: A Little Help from another person bathing (including washing, rinsing, drying)?: A Little Help from another person to put on and taking off regular upper body clothing?: A Lot Help from another person to put on and taking off regular lower body clothing?: A Lot 6 Click Score: 18   End of Session Equipment Utilized During Treatment: Back brace;Gait belt Nurse Communication: Mobility status  Activity Tolerance: Patient tolerated treatment well Patient left: Other (comment)(in hallway walking with PT)  OT Visit Diagnosis: Unsteadiness on feet (R26.81);Other abnormalities of gait and mobility (R26.89);Pain Pain - part of body: (back)                Time: 4332-9518 OT Time Calculation (  min): 24 min Charges:  OT General Charges $OT Visit: 1 Visit OT Evaluation $OT Eval Moderate Complexity: 1 Mod OT Treatments $Self Care/Home Management : 8-22 mins  Hulda Humphrey OTR/L Acute Rehabilitation Services Pager: [phone] Office: [place] 09/17/2018, 12:28 PM

## 2018-09-17 NOTE — Care Management Note (Signed)
Case Management Note  Patient Details  Name: Catherine [name] MRN: [id number] Date of Birth: [birth year]  Subjective/Objective:    Pt is a 62 y/o female s/p L4/5 decompression and fusion.  PTA, pt independent, lives at home alone.                  Action/Plan: Pt's daughter currently staying with her and can provide assistance at dc.  PT/OT recommending no OP follow up; 3 in 1 BSC requested from [facility] for home use.  DC planned for 9/28.    Expected Discharge Date:  09/17/18               Expected Discharge Plan:  Home/Self Care  In-House Referral:     Discharge planning Services  CM Consult  Post Acute Care Choice:    Choice offered to:     DME Arranged:  3-N-1 DME Agency:     HH Arranged:    HH Agency:     Status of Service:  Completed, signed off  If discussed at [employer] of Stay Meetings, dates discussed:    Additional Comments:  Reinaldo Raddle, RN, BSN  Trauma/Neuro ICU Case Manager [phone]

## 2018-09-17 NOTE — Progress Notes (Signed)
Orthopedic Tech Progress Note Patient Details:  Catherine [name] August 06, 1956 [id number] Called [employer] for brace order. Patient ID: Catherine [name], female   DOB: [birth year], 62 y.o.   MRN: [id number]   Braulio Bosch 09/17/2018, 9:31 AM

## 2018-09-17 NOTE — Evaluation (Signed)
Physical Therapy Evaluation Patient Details Name: Catherine [name] MRN: [id number] DOB: 02-24-1956 Today's Date: 09/17/2018   History of Present Illness  Pt is a 62 y/o female s/p L4/5 decompression and fusion. Pt has a PMH including Anxiety, Arthritis, CAD, COPD, Depression, Headache, Hypertension, Lumbar spinal stenosis, Obesity, Vertigo, and Wears glasses.   Clinical Impression  Pt admitted with/for lumbar fusion.  Pt needing reinforcement of all education.  Pt currently limited functionally due to the problems listed below.  (see problems list.)  Pt will benefit from PT to maximize function and safety to be able to get home safely with available assist .     Follow Up Recommendations No PT follow up    Equipment Recommendations  3in1 (PT)    Recommendations for Other Services       Precautions / Restrictions Precautions Precautions: Back Precaution Booklet Issued: Yes (comment) Precaution Comments: reviewed back precautions and handout in full Required Braces or Orthoses: Spinal Brace Spinal Brace: Thoracolumbosacral orthotic;Applied in sitting position Restrictions Weight Bearing Restrictions: No      Mobility  Bed Mobility Overal bed mobility: Needs Assistance Bed Mobility: Sidelying to Sit   Sidelying to sit: Min guard       General bed mobility comments: use of bed rail to assist with trunk elevation  Transfers Overall transfer level: Needs assistance Equipment used: None Transfers: Sit to/from Stand Sit to Stand: Min guard         General transfer comment: min guard for safety  Ambulation/Gait Ambulation/Gait assistance: Min guard Gait Distance (Feet): 150 Feet Assistive device: Rolling walker (2 wheeled);None Gait Pattern/deviations: Step-through pattern Gait velocity: slower Gait velocity interpretation: <1.8 ft/sec, indicate of risk for recurrent falls General Gait Details: steady, but tentative with/without AD.  pt does not want a RW and should  progress to not needing an AD quickly.  Stairs Stairs: (deferred by patient)          Wheelchair Mobility    Modified Rankin (Stroke Patients Only)       Balance Overall balance assessment: Mild deficits observed, not formally tested                                           Pertinent Vitals/Pain Pain Assessment: 0-10 Pain Score: 5  Pain Location: incision site Pain Descriptors / Indicators: Grimacing;Sore Pain Intervention(s): Premedicated before session;Monitored during session    [place] expects to be discharged to:: Private residence Living Arrangements: Alone;Children(daughter is staying with her) Available Help at Discharge: Family;Available 24 hours/day Type of Home: Apartment Home Access: Level entry     Home Layout: One level Home Equipment: None      Prior Function Level of Independence: Independent               Hand Dominance   Dominant Hand: Right    Extremity/Trunk Assessment   Upper Extremity Assessment Upper Extremity Assessment: Overall WFL for tasks assessed    Lower Extremity Assessment Lower Extremity Assessment: Overall WFL for tasks assessed    Cervical / Trunk Assessment Cervical / Trunk Assessment: Other exceptions Cervical / Trunk Exceptions: s/p lumbar sx  Communication   Communication: No difficulties  Cognition Arousal/Alertness: Awake/alert Behavior During Therapy: WFL for tasks assessed/performed Overall Cognitive Status: Impaired/Different from baseline Area of Impairment: Safety/judgement  Safety/Judgement: Decreased awareness of safety;Decreased awareness of deficits     General Comments: Pt required verbal cues throughout session for back precautions      General Comments General comments (skin integrity, edema, etc.): Completed education with pt.  pt not totally accepting on the "do's and don'ts"    Exercises     Assessment/Plan     PT Assessment Patient needs continued PT services  PT Problem List Decreased mobility;Decreased activity tolerance;Decreased knowledge of use of DME;Decreased knowledge of precautions;Pain       PT Treatment Interventions Gait training;Functional mobility training;Therapeutic activities;Patient/family education    PT Goals (Current goals can be found in the Care Plan section)  Acute Rehab PT Goals Patient Stated Goal: "get pain down" PT Goal Formulation: With patient Time For Goal Achievement: 09/20/18 Potential to Achieve Goals: Good    Frequency Min 5X/week   Barriers to discharge        Co-evaluation               AM-PAC PT "6 Clicks" Daily Activity  Outcome Measure Difficulty turning over in bed (including adjusting bedclothes, sheets and blankets)?: A Little Difficulty moving from lying on back to sitting on the side of the bed? : A Little Difficulty sitting down on and standing up from a chair with arms (e.g., wheelchair, bedside commode, etc,.)?: A Little Help needed moving to and from a bed to chair (including a wheelchair)?: A Little Help needed walking in hospital room?: A Little Help needed climbing 3-5 steps with a railing? : A Little 6 Click Score: 18    End of Session Equipment Utilized During Treatment: Back brace Activity Tolerance: Patient tolerated treatment well;Patient limited by pain Patient left: in bed;with call bell/phone within reach;with family/visitor present Nurse Communication: Mobility status PT Visit Diagnosis: Other abnormalities of gait and mobility (R26.89);Pain Pain - part of body: (back)    Time: 0768-0881 PT Time Calculation (min) (ACUTE ONLY): 24 min   Charges:   PT Evaluation $PT Eval Low Complexity: 1 Low PT Treatments $Gait Training: 8-22 mins        09/17/2018  Donnella Sham, PT Acute Rehabilitation Services [phone]  (pager) [phone]  (office)  Catherine [name] 09/17/2018, 1:04 PM

## 2018-09-17 NOTE — Progress Notes (Signed)
    Patient doing well  Patient denies leg pain Moderate back pain Has been ambulating   Physical Exam: Vitals:   09/16/18 2200 09/17/18 0442  BP:  113/64  Pulse:  63  Resp:  19  Temp:  98.1 F (36.7 C)  SpO2: 90% 93%    Dressing in place NVI  POD #1 s/p L4/5 decompression and fusion, doing well  - up with PT/OT, encourage ambulation - Percocet for pain, Valium for muscle spasms - likely d/c home today with f/u in 2 weeks, depending on PT/OT progress

## 2018-09-17 NOTE — Progress Notes (Signed)
Tried to call Case Management but no answer. Pt called her ride and is now ready to go. No equipment here. Called [facility] and they are stating that "they cannot see an order for the equipment". Stating that the liasion has to fax or send the order over.  Nurse asked if equipment can be delivered to address provided by the patient. Was told that they do not deliver but they can ship it to an address left and it usually takes 3-5 business days.  Nurse explained all of this to patient. Patient is going to leave.  Patient IV removed and taken down to car.  Discharge completed.

## 2018-09-20 ENCOUNTER — Encounter (HOSPITAL_COMMUNITY): Payer: Self-pay | Admitting: Orthopedic Surgery

## 2018-09-20 MED FILL — Sodium Chloride IV Soln 0.9%: INTRAVENOUS | Qty: 1000 | Status: AC

## 2018-09-20 MED FILL — Heparin Sodium (Porcine) Inj 1000 Unit/ML: INTRAMUSCULAR | Qty: 30 | Status: AC

## 2018-09-22 NOTE — Discharge Summary (Signed)
Patient ID: Catherine [name] MRN: [id number] DOB/AGE: 30-Oct-1956 62 y.o.  Admit date: 09/16/2018 Discharge date: 09/17/2018  Admission Diagnoses:  Active Problems:   Spinal stenosis   Discharge Diagnoses:  Same  Past Medical History:  Diagnosis Date  . Anxiety   . Arthritis   . CAD (coronary artery disease)   . COPD (chronic obstructive pulmonary disease) ([place])   . Depression   . Headache    migraines  . Hypertension   . Lumbar spinal stenosis   . Obesity   . Spondylolisthesis at L4-L5 level   . Vertigo   . Wears glasses     Surgeries: Procedure(s): LEFT-SIDED LUMBAR 4-5 TRANSFORAMINAL LUMBAR INTERBODY FUSION WITH INSTRUMENTATION AND ALLOGRAFT on 09/16/2018   Consultants: None  Discharged Condition: Improved  Hospital Course: Catherine [name] is an 62 y.o. female who was admitted 09/16/2018 for operative treatment of radiculoapthy. Patient has severe unremitting pain that affects sleep, daily activities, and work/hobbies. After pre-op clearance the patient was taken to the operating room on 09/16/2018 and underwent  Procedure(s): LEFT-SIDED LUMBAR 4-5 TRANSFORAMINAL LUMBAR INTERBODY FUSION WITH INSTRUMENTATION AND ALLOGRAFT.    Patient was given perioperative antibiotics:  Anti-infectives (From admission, onward)   Start     Dose/Rate Route Frequency Ordered Stop   09/16/18 2115  ceFAZolin (ANCEF) IVPB 1 g/50 mL premix     1 g 100 mL/hr over 30 Minutes Intravenous Every 8 hours 09/16/18 2103 09/17/18 1314   09/16/18 0845  ceFAZolin (ANCEF) IVPB 2g/100 mL premix     2 g 200 mL/hr over 30 Minutes Intravenous On call to O.R. 09/16/18 0836 09/16/18 1213       Patient was given sequential compression devices, early ambulation to prevent DVT.  Patient benefited maximally from hospital stay and there were no complications.    Recent vital signs: BP (!) 95/51 (BP Location: Right Arm)   Pulse 68   Temp 97.8 F (36.6 C) (Oral)   Resp 18   Ht 5' (1.524 m)   Wt 84.7 kg    SpO2 96%   BMI 36.46 kg/m    Discharge Medications:   Allergies as of 09/17/2018      Reactions   Other Nausea Only, Other (See Comments)   Darvocet   Azithromycin Nausea And Vomiting   Ciprofloxacin Itching      Medication List    TAKE these medications   albuterol (2.5 MG/3ML) 0.083% nebulizer solution Commonly known as:  PROVENTIL Inhale 2.5 mg into the lungs every 4 (four) hours as needed for wheezing or shortness of breath.   albuterol 108 (90 Base) MCG/ACT inhaler Commonly known as:  PROVENTIL HFA;VENTOLIN HFA Inhale 1-2 puffs into the lungs every 6 (six) hours as needed for wheezing or shortness of breath.   amLODipine 10 MG tablet Commonly known as:  NORVASC Take 1 tablet (10 mg total) by mouth every morning.   aspirin 81 MG chewable tablet Chew 81 mg by mouth daily.   BUPAP 50-300 MG Tabs Generic drug:  Butalbital-Acetaminophen Take 1 tablet by mouth every 8 (eight) hours as needed for headache.   carvedilol 6.25 MG tablet Commonly known as:  COREG TAKE 1 TABLET BY MOUTH TWO  TIMES DAILY WITH A MEAL What changed:    how much to take  how to take this  when to take this  additional instructions   clonazePAM 1 MG tablet Commonly known as:  KLONOPIN take 1 TABLET BY MOUTH TWICE DAILY   diclofenac 75 MG EC tablet  Commonly known as:  VOLTAREN Take 75 mg by mouth 2 (two) times daily.   diclofenac sodium 1 % Gel Commonly known as:  VOLTAREN Apply 1 application topically 4 (four) times daily.   DULoxetine 30 MG capsule Commonly known as:  CYMBALTA Take 1 capsule (30 mg total) by mouth daily.   fluticasone 50 MCG/ACT nasal spray Commonly known as:  FLONASE Place 2 sprays into both nostrils daily.   gabapentin 300 MG capsule Commonly known as:  NEURONTIN Take 300 mg by mouth 3 (three) times daily.   methocarbamol 500 MG tablet Commonly known as:  ROBAXIN Take 500 mg by mouth every 8 (eight) hours as needed for muscle spasms.   omeprazole 20  MG capsule Commonly known as:  PRILOSEC TAKE 1 CAPSULE BY MOUTH  DAILY BEFORE BREAKFAST What changed:    how much to take  how to take this  when to take this  additional instructions   potassium chloride 10 MEQ tablet Commonly known as:  K-DUR Take 1 tablet (10 mEq total) by mouth daily.   rosuvastatin 10 MG tablet Commonly known as:  CRESTOR Take 10 mg by mouth daily.   SYMBICORT 80-4.5 MCG/ACT inhaler Generic drug:  budesonide-formoterol Use 2 puffs TWICE DAILY (120/4=30) What changed:  See the new instructions.   VITAMIN D PO Take 1 tablet by mouth daily.       Diagnostic Studies: Dg Lumbar Spine 2-3 Views  Result Date: 09/16/2018 CLINICAL DATA:  L4-5 TLIF EXAM: DG C-ARM 61-120 MIN; LUMBAR SPINE - 2-3 VIEW COMPARISON:  None. FLUOROSCOPY TIME:  Radiation Exposure Index (as provided by the fluoroscopic device): 26.29 mGy If the device does not provide the exposure index: Fluoroscopy Time:  54 seconds Number of Acquired Images:  9 FINDINGS: Initial images demonstrate surgical instruments over the L4-5 interspace. Pedicle placement was then noted bilaterally at L4 and L5 with interbody fusion and posterior fixation. IMPRESSION: L4-5 fusion. Electronically Signed   By: Inez Catalina M.D.   On: 09/16/2018 15:37   Dg Lumbar Spine 1 View  Result Date: 09/16/2018 CLINICAL DATA:  Localization for L4-5 lumbar fusion EXAM: LUMBAR SPINE - 1 VIEW COMPARISON:  MR lumbar spine of 05/20/2017 FINDINGS: The more cephalad of the 2 needles located posteriorly is directed toward the upper spinous process of L3 with the second directed toward the spinous process posteriorly of L4. IMPRESSION: Needles directed toward the spinous processes of L3 and L4 posteriorly. Electronically Signed   By: Ivar Drape M.D.   On: 09/16/2018 15:59   Dg C-arm 1-60 Min  Result Date: 09/16/2018 CLINICAL DATA:  L4-5 TLIF EXAM: DG C-ARM 61-120 MIN; LUMBAR SPINE - 2-3 VIEW COMPARISON:  None. FLUOROSCOPY TIME:   Radiation Exposure Index (as provided by the fluoroscopic device): 26.29 mGy If the device does not provide the exposure index: Fluoroscopy Time:  54 seconds Number of Acquired Images:  9 FINDINGS: Initial images demonstrate surgical instruments over the L4-5 interspace. Pedicle placement was then noted bilaterally at L4 and L5 with interbody fusion and posterior fixation. IMPRESSION: L4-5 fusion. Electronically Signed   By: Inez Catalina M.D.   On: 09/16/2018 15:37   Dg C-arm 1-60 Min  Result Date: 09/16/2018 CLINICAL DATA:  L4-5 TLIF EXAM: DG C-ARM 61-120 MIN; LUMBAR SPINE - 2-3 VIEW COMPARISON:  None. FLUOROSCOPY TIME:  Radiation Exposure Index (as provided by the fluoroscopic device): 26.29 mGy If the device does not provide the exposure index: Fluoroscopy Time:  54 seconds Number of Acquired Images:  9 FINDINGS: Initial images demonstrate surgical instruments over the L4-5 interspace. Pedicle placement was then noted bilaterally at L4 and L5 with interbody fusion and posterior fixation. IMPRESSION: L4-5 fusion. Electronically Signed   By: Inez Catalina M.D.   On: 09/16/2018 15:37    Disposition:    POD #1 s/p L4/5 decompression and fusion, doing well  - up with PT/OT, encourage ambulation - Percocet for pain, Valium for muscle spasms -Written scripts for pain signed and in chart -D/C instructions sheet printed and in chart -D/C today  -F/U in office 2 weeks   Signed: Justice Britain 09/22/2018, 2:02 PM

## 2018-09-29 DIAGNOSIS — Z9889 Other specified postprocedural states: Secondary | ICD-10-CM | POA: Diagnosis not present

## 2018-10-01 DIAGNOSIS — M4800 Spinal stenosis, site unspecified: Secondary | ICD-10-CM | POA: Diagnosis not present

## 2018-10-03 DIAGNOSIS — J449 Chronic obstructive pulmonary disease, unspecified: Secondary | ICD-10-CM | POA: Diagnosis not present

## 2018-10-06 DIAGNOSIS — M4800 Spinal stenosis, site unspecified: Secondary | ICD-10-CM | POA: Diagnosis not present

## 2018-10-23 ENCOUNTER — Emergency Department (HOSPITAL_COMMUNITY): Payer: Medicare Other

## 2018-10-23 ENCOUNTER — Encounter (HOSPITAL_COMMUNITY): Payer: Self-pay | Admitting: Emergency Medicine

## 2018-10-23 ENCOUNTER — Emergency Department (HOSPITAL_COMMUNITY)
Admission: EM | Admit: 2018-10-23 | Discharge: 2018-10-23 | Disposition: A | Discharge status: Home / Self Care | Payer: Medicare Other | Attending: Emergency Medicine | Admitting: Emergency Medicine

## 2018-10-23 DIAGNOSIS — X58XXXA Exposure to other specified factors, initial encounter: Secondary | ICD-10-CM | POA: Insufficient documentation

## 2018-10-23 DIAGNOSIS — Y929 Unspecified place or not applicable: Secondary | ICD-10-CM | POA: Insufficient documentation

## 2018-10-23 DIAGNOSIS — Y939 Activity, unspecified: Secondary | ICD-10-CM | POA: Diagnosis not present

## 2018-10-23 DIAGNOSIS — S39012A Strain of muscle, fascia and tendon of lower back, initial encounter: Secondary | ICD-10-CM | POA: Insufficient documentation

## 2018-10-23 DIAGNOSIS — Y999 Unspecified external cause status: Secondary | ICD-10-CM | POA: Insufficient documentation

## 2018-10-23 DIAGNOSIS — W19XXXA Unspecified fall, initial encounter: Secondary | ICD-10-CM

## 2018-10-23 DIAGNOSIS — S3992XA Unspecified injury of lower back, initial encounter: Secondary | ICD-10-CM | POA: Diagnosis not present

## 2018-10-23 DIAGNOSIS — J449 Chronic obstructive pulmonary disease, unspecified: Secondary | ICD-10-CM | POA: Diagnosis not present

## 2018-10-23 DIAGNOSIS — Z79899 Other long term (current) drug therapy: Secondary | ICD-10-CM | POA: Insufficient documentation

## 2018-10-23 DIAGNOSIS — I10 Essential (primary) hypertension: Secondary | ICD-10-CM | POA: Insufficient documentation

## 2018-10-23 NOTE — Discharge Instructions (Addendum)
Follow-up with your bone surgeon.  Take your medications as previously prescribed. Return for new numbness, weakness or other concerns.

## 2018-10-23 NOTE — ED Notes (Signed)
Patient transported to CT 

## 2018-10-23 NOTE — ED Notes (Signed)
Patient verbalizes understanding of discharge instructions. Opportunity for questioning and answers were provided. Ambulatory at discharge in NAD.  

## 2018-10-23 NOTE — ED Provider Notes (Signed)
[facility] EMERGENCY DEPARTMENT Provider Note   CSN: [id number] Arrival date & time: 10/23/18  1658     History   Chief Complaint Chief Complaint  Patient presents with  . Back Pain    HPI Catherine [name] is a 62 y.o. female.  Patient with recent lumbar fusion L4-L5 by orthopedics due to spinal stenosis presents after a fall.  Patient fell asleep and fell on her left lower back and has had pain with movement since.  Patient is concerned she may have injured her surgery site.  No fevers or chills.  No new weakness or numbness in her lower extremities.     Past Medical History:  Diagnosis Date  . Anxiety   . Arthritis   . CAD (coronary artery disease)   . COPD (chronic obstructive pulmonary disease) ([place])   . Depression   . Headache    migraines  . Hypertension   . Lumbar spinal stenosis   . Obesity   . Spondylolisthesis at L4-L5 level   . Vertigo   . Wears glasses     Patient Active Problem List   Diagnosis Date Noted  . Spinal stenosis 09/16/2018  . Sinusitis, acute maxillary 12/30/2016  . Dizziness 12/30/2016  . Sinusitis 12/30/2016  . Proteinuria 05/29/2016  . Dysuria 04/03/2016  . Soft tissue lesion of foot 04/03/2016  . Obesity   . Vaginal discharge 10/02/2015  . Other bursal cyst, left ankle and foot 07/19/2015  . Anxiety and depression 05/04/2015  . LEUKOCYTOSIS 10/31/2010  . HYPOKALEMIA 09/18/2010  . GERD 09/18/2010  . TRANSAMINASES, SERUM, ELEVATED 04/08/2010  . DEPRESSION, PROLONGED 12/04/2009  . ANEMIA 11/07/2009  . CHEST PAIN, ATYPICAL 03/28/2009  . SEDIMENTATION RATE, ELEVATED 01/05/2009  . ABSCESS, TOOTH 12/27/2008  . HEPATIC CYST 12/25/2008  . Migraine 05/27/2007  . HYPERLIPIDEMIA 03/10/2007  . CORONARY ARTERY DISEASE 02/27/2007  . ANXIETY DISORDER, GENERALIZED 10/12/2006  . VERTIGO, BENIGN PAROXYSMAL POSITION 10/12/2006  . Essential hypertension 10/12/2006  . DENTAL CARIES 10/12/2006  . INSOMNIA 10/12/2006     Past Surgical History:  Procedure Laterality Date  . CARDIAC CATHETERIZATION    . COLONOSCOPY W/ BIOPSIES AND POLYPECTOMY    . MULTIPLE TOOTH EXTRACTIONS    . TRANSFORAMINAL LUMBAR INTERBODY FUSION (TLIF) WITH PEDICLE SCREW FIXATION 1 LEVEL Left 09/16/2018   Procedure: LEFT-SIDED LUMBAR 4-5 TRANSFORAMINAL LUMBAR INTERBODY FUSION WITH INSTRUMENTATION AND ALLOGRAFT;  Surgeon: Phylliss Bob, MD;  Location: [facility];  Service: Orthopedics;  Laterality: Left;  . TUBAL LIGATION       OB History   None      Home Medications    Prior to Admission medications   Medication Sig Start Date End Date Taking? Authorizing Provider  albuterol (PROVENTIL HFA;VENTOLIN HFA) 108 (90 BASE) MCG/ACT inhaler Inhale 1-2 puffs into the lungs every 6 (six) hours as needed for wheezing or shortness of breath.    [provider]  albuterol (PROVENTIL) (2.5 MG/3ML) 0.083% nebulizer solution Inhale 2.5 mg into the lungs every 4 (four) hours as needed for wheezing or shortness of breath.  06/02/18   [provider]  amLODipine (NORVASC) 10 MG tablet Take 1 tablet (10 mg total) by mouth every morning. 02/11/17   Golden Circle, FNP  aspirin 81 MG chewable tablet Chew 81 mg by mouth daily.    [provider]  BUPAP 50-300 MG TABS Take 1 tablet by mouth every 8 (eight) hours as needed for headache. 09/01/18   [provider]  carvedilol (COREG) 6.25  MG tablet TAKE 1 TABLET BY MOUTH TWO  TIMES DAILY WITH A MEAL Patient taking differently: Take 6.25 mg by mouth 2 (two) times daily with a meal.  11/11/16   Golden Circle, FNP  Cholecalciferol (VITAMIN D PO) Take 1 tablet by mouth daily.    [provider]  clonazePAM (KLONOPIN) 1 MG tablet take 1 TABLET BY MOUTH TWICE DAILY Patient taking differently: Take 1 mg by mouth 2 (two) times daily.  02/12/17   Golden Circle, FNP  diclofenac (VOLTAREN) 75 MG EC tablet Take 75 mg by mouth 2 (two) times daily.    [provider]  diclofenac sodium (VOLTAREN) 1 % GEL Apply 1 application topically 4 (four) times daily.    [provider]  DULoxetine (CYMBALTA) 30 MG capsule Take 1 capsule (30 mg total) by mouth daily. Patient not taking: Reported on 10/27/2017 02/11/17   Golden Circle, FNP  fluticasone [facility]) 50 MCG/ACT nasal spray Place 2 sprays into both nostrils daily. Patient not taking: Reported on 09/09/2018 01/14/17   Binnie Rail, MD  gabapentin (NEURONTIN) 300 MG capsule Take 300 mg by mouth 3 (three) times daily.    [provider]  methocarbamol (ROBAXIN) 500 MG tablet Take 500 mg by mouth every 8 (eight) hours as needed for muscle spasms.    [provider]  omeprazole (PRILOSEC) 20 MG capsule TAKE 1 CAPSULE BY MOUTH  DAILY BEFORE BREAKFAST Patient taking differently: Take 20 mg by mouth daily.  11/11/16   Golden Circle, FNP  potassium chloride (K-DUR) 10 MEQ tablet Take 1 tablet (10 mEq total) by mouth daily. 01/20/17   Golden Circle, FNP  rosuvastatin (CRESTOR) 10 MG tablet Take 10 mg by mouth daily.    [provider]  SYMBICORT 80-4.5 MCG/ACT inhaler Use 2 puffs TWICE DAILY (120/4=30) Patient taking differently: Inhale 2 puffs into the lungs 2 (two) times daily.  01/20/17   Golden Circle, FNP    Family History Family History  Problem Relation Age of Onset  . Kidney disease Mother   . Seizures Mother   . Heart failure Father   . Colon cancer Neg Hx   . Breast cancer Neg Hx     Social History Social History   Tobacco Use  . Smoking status: Never Smoker  . Smokeless tobacco: Never Used  Substance Use Topics  . Alcohol use: No  . Drug use: No     Allergies   Other; Azithromycin; and Ciprofloxacin   Review of Systems Review of Systems  Constitutional: Negative for chills and fever.  HENT: Negative for congestion.   Eyes: Negative for visual disturbance.  Respiratory: Negative for shortness of breath.   Cardiovascular:  Negative for chest pain.  Gastrointestinal: Negative for abdominal pain and vomiting.  Genitourinary: Negative for dysuria and flank pain.  Musculoskeletal: Positive for back pain. Negative for neck pain and neck stiffness.  Skin: Negative for rash.  Neurological: Negative for weakness, light-headedness and headaches.     Physical Exam Updated Vital Signs BP 136/88 (BP Location: Right Arm)   Pulse 68   Temp 98.2 F (36.8 C) (Oral)   Resp 18   SpO2 96%   Physical Exam  Constitutional: She is oriented to person, place, and time. She appears well-developed and well-nourished.  HENT:  Head: Normocephalic and atraumatic.  Eyes: Right eye exhibits no discharge. Left eye exhibits no discharge.  Neck: Neck supple. No tracheal deviation present.  Cardiovascular: Normal rate.  Pulmonary/Chest: Effort normal.  Abdominal: She exhibits no distension.  Musculoskeletal: She exhibits tenderness. She exhibits no edema.  Patient has tenderness lumbar spine lower paraspinal.  No significant midline tenderness.  Surgical wound healing well no external sign of infection.  Neurological: She is alert and oriented to person, place, and time.  Patient has equal strength lower extremities bilateral.  Skin: Skin is warm. No rash noted.  Psychiatric: She has a normal mood and affect.  Nursing note and vitals reviewed.    ED Treatments / Results  Labs (all labs ordered are listed, but only abnormal results are displayed) Labs Reviewed - No data to display  EKG None  Radiology Ct Lumbar Spine Wo Contrast  Result Date: 10/23/2018 CLINICAL DATA:  Recent back surgery.  Fall from chair. EXAM: CT LUMBAR SPINE WITHOUT CONTRAST TECHNIQUE: Multidetector CT imaging of the lumbar spine was performed without intravenous contrast administration. Multiplanar CT image reconstructions were also generated. COMPARISON:  CT AP 07/31/2011 Lumbar spine radiograph 09/16/2018 Lumbar spine MRI 05/20/2018 FINDINGS:  Segmentation: 5 lumbar type vertebrae. Alignment: Normal Vertebrae: L4-L5 PLIF. No perihardware lucency. No fractures of the lumbar spine. Paraspinal and other soft tissues: Incompletely visualized hypoattenuating focus of the lower right hepatic lobe. This corresponds to previously demonstrated cyst. Disc levels: The T12-L4 disc levels are normal. L4-L5: Postfusion changes. There is unchanged mild bilateral foraminal narrowing. L5-S1: Minimal disc bulge without stenosis. IMPRESSION: 1. Status post L4-L5 PLIF without hardware adverse features. 2. No acute fracture or static subluxation of the lumbar spine. Electronically Signed   By: Ulyses Jarred M.D.   On: 10/23/2018 18:59    Procedures Procedures (including critical care time)  Medications Ordered in ED Medications - No data to display   Initial Impression / Assessment and Plan / ED Course  I have reviewed the triage vital signs and the nursing notes.  Pertinent labs & imaging results that were available during my care of the patient were reviewed by me and considered in my medical decision making (see chart for details).    Patient presents after low risk fall and recent lumbar surgery.  CT scan performed in no acute findings.  Patient denies any new neurologic symptoms.  Patient stable for close outpatient follow-up with orthopedics.  Results and differential diagnosis were discussed with the patient/parent/guardian. Xrays were independently reviewed by myself.  Close follow up outpatient was discussed, comfortable with the plan.   Medications - No data to display  Vitals:   10/23/18 1706  BP: 136/88  Pulse: 68  Resp: 18  Temp: 98.2 F (36.8 C)  TempSrc: Oral  SpO2: 96%    Final diagnoses:  Fall, initial encounter  Lumbar strain, initial encounter     Final Clinical Impressions(s) / ED Diagnoses   Final diagnoses:  Fall, initial encounter  Lumbar strain, initial encounter    ED Discharge Orders    None        Elnora Morrison, MD 10/23/18 2002

## 2018-10-23 NOTE — ED Notes (Signed)
Pt in CT.

## 2018-10-23 NOTE — ED Triage Notes (Signed)
Pt had lumbar surgery 9/26 with MD Dumonski. Pt states she was sleeping in a chair and slid out. Pt states she has pain to her lumbar spine states it is 8/10. Pt had no pain before she fell out of the chair.

## 2018-10-23 NOTE — ED Notes (Addendum)
Pt is in CT, CT aware that pt can be returned to D[facility] after imaging

## 2018-11-01 DIAGNOSIS — M545 Low back pain: Secondary | ICD-10-CM | POA: Diagnosis not present

## 2018-11-03 DIAGNOSIS — J449 Chronic obstructive pulmonary disease, unspecified: Secondary | ICD-10-CM | POA: Diagnosis not present

## 2018-11-16 DIAGNOSIS — I10 Essential (primary) hypertension: Secondary | ICD-10-CM | POA: Diagnosis not present

## 2018-11-16 DIAGNOSIS — Z0000 Encounter for general adult medical examination without abnormal findings: Secondary | ICD-10-CM | POA: Diagnosis not present

## 2018-11-16 DIAGNOSIS — R5383 Other fatigue: Secondary | ICD-10-CM | POA: Diagnosis not present

## 2018-11-16 DIAGNOSIS — D72819 Decreased white blood cell count, unspecified: Secondary | ICD-10-CM | POA: Diagnosis not present

## 2018-11-16 DIAGNOSIS — E782 Mixed hyperlipidemia: Secondary | ICD-10-CM | POA: Diagnosis not present

## 2018-12-03 DIAGNOSIS — J449 Chronic obstructive pulmonary disease, unspecified: Secondary | ICD-10-CM | POA: Diagnosis not present

## 2018-12-13 DIAGNOSIS — M545 Low back pain: Secondary | ICD-10-CM | POA: Diagnosis not present

## 2019-01-03 DIAGNOSIS — J449 Chronic obstructive pulmonary disease, unspecified: Secondary | ICD-10-CM | POA: Diagnosis not present

## 2019-02-03 DIAGNOSIS — J449 Chronic obstructive pulmonary disease, unspecified: Secondary | ICD-10-CM | POA: Diagnosis not present

## 2019-02-15 DIAGNOSIS — M79605 Pain in left leg: Secondary | ICD-10-CM | POA: Diagnosis not present

## 2019-02-15 DIAGNOSIS — G4700 Insomnia, unspecified: Secondary | ICD-10-CM | POA: Diagnosis not present

## 2019-02-15 DIAGNOSIS — M545 Low back pain: Secondary | ICD-10-CM | POA: Diagnosis not present

## 2019-03-29 ENCOUNTER — Other Ambulatory Visit: Payer: Self-pay

## 2019-03-29 ENCOUNTER — Ambulatory Visit (INDEPENDENT_AMBULATORY_CARE_PROVIDER_SITE_OTHER): Payer: Medicare Other | Admitting: Registered Nurse

## 2019-03-29 ENCOUNTER — Encounter: Payer: Self-pay | Admitting: Registered Nurse

## 2019-03-29 VITALS — BP 128/58 | Ht 61.5 in | Wt 186.0 lb

## 2019-03-29 DIAGNOSIS — Z0000 Encounter for general adult medical examination without abnormal findings: Secondary | ICD-10-CM | POA: Diagnosis not present

## 2019-03-29 NOTE — Progress Notes (Signed)
Presents today for [employer] Visit Today's visit took place via telephone. Patient is aware that this is a substitute for the in-person AWV.  Date of last exam: Hospital admission and surgery on 09/16/18 for L4-5 fusion, ED admission on 10/23/18 for fall  Interpreter used for this visit? No  Patient Care Team: Vonna Drafts, FNP as PCP - General (Nurse Practitioner)   Other items to address today: Discussed anxiety and clonazepam rx, discussed concerns regarding COVID-19    ADVANCE DIRECTIVES: Discussed:Yes - Patient updated in Fall 2019, will bring to office for next visit. On File: No Materials Provided: No  Immunization status:  Immunization History  Administered Date(s) Administered  . Influenza Whole 11/28/2010  . Influenza,inj,Quad PF,6+ Mos 08/29/2015, 10/28/2016, 10/27/2017  . Td 04/04/2010      Functional Status Survey:     6CIT Screen 03/29/2019  What Year? 0 points  What month? 0 points  What time? 0 points  Count back from 20 0 points  Repeat phrase 0 points         Home Environment: Pt lives in apartment with 75yo son and 11 yo autistic grandson. Feels safe and comfortable at home. Feels she can rely on son for assistance with errands and help at home. No throw rugs, no grab bars in showers. Pt has no issues climbing stairs.    Patient Active Problem List   Diagnosis Date Noted  . Spinal stenosis 09/16/2018  . Sinusitis, acute maxillary 12/30/2016  . Dizziness 12/30/2016  . Sinusitis 12/30/2016  . Proteinuria 05/29/2016  . Dysuria 04/03/2016  . Soft tissue lesion of foot 04/03/2016  . Obesity   . Vaginal discharge 10/02/2015  . Other bursal cyst, left ankle and foot 07/19/2015  . Anxiety and depression 05/04/2015  . LEUKOCYTOSIS 10/31/2010  . HYPOKALEMIA 09/18/2010  . GERD 09/18/2010  . TRANSAMINASES, SERUM, ELEVATED 04/08/2010  . DEPRESSION, PROLONGED 12/04/2009  . ANEMIA 11/07/2009  . CHEST PAIN, ATYPICAL  03/28/2009  . SEDIMENTATION RATE, ELEVATED 01/05/2009  . ABSCESS, TOOTH 12/27/2008  . HEPATIC CYST 12/25/2008  . Migraine 05/27/2007  . HYPERLIPIDEMIA 03/10/2007  . CORONARY ARTERY DISEASE 02/27/2007  . ANXIETY DISORDER, GENERALIZED 10/12/2006  . VERTIGO, BENIGN PAROXYSMAL POSITION 10/12/2006  . Essential hypertension 10/12/2006  . DENTAL CARIES 10/12/2006  . INSOMNIA 10/12/2006     Past Medical History:  Diagnosis Date  . Anxiety   . Arthritis   . CAD (coronary artery disease)   . COPD (chronic obstructive pulmonary disease) ([place])   . Depression   . Headache    migraines  . Hypertension   . Lumbar spinal stenosis   . Obesity   . Spondylolisthesis at L4-L5 level   . Vertigo   . Wears glasses      Past Surgical History:  Procedure Laterality Date  . CARDIAC CATHETERIZATION    . COLONOSCOPY W/ BIOPSIES AND POLYPECTOMY    . MULTIPLE TOOTH EXTRACTIONS    . TRANSFORAMINAL LUMBAR INTERBODY FUSION (TLIF) WITH PEDICLE SCREW FIXATION 1 LEVEL Left 09/16/2018   Procedure: LEFT-SIDED LUMBAR 4-5 TRANSFORAMINAL LUMBAR INTERBODY FUSION WITH INSTRUMENTATION AND ALLOGRAFT;  Surgeon: Phylliss Bob, MD;  Location: [place];  Service: Orthopedics;  Laterality: Left;  . TUBAL LIGATION       Family History  Problem Relation Age of Onset  . Kidney disease Mother   . Seizures Mother   . Heart failure Father   . Colon cancer Neg Hx   . Breast cancer Neg Hx  Social History   Socioeconomic History  . Marital status: Single    Spouse name: Not on file  . Number of children: 2  . Years of education: 9  . Highest education level: Not on file  Occupational History  . Occupation: Disability    Comment: Heart Disease  Social Needs  . Financial resource strain: Not on file  . Food insecurity:    Worry: Not on file    Inability: Not on file  . Transportation needs:    Medical: Not on file    Non-medical: Not on file  Tobacco Use  . Smoking status: Never Smoker  . Smokeless  tobacco: Never Used  Substance and Sexual Activity  . Alcohol use: No  . Drug use: No  . Sexual activity: Yes    Birth control/protection: Surgical  Lifestyle  . Physical activity:    Days per week: Not on file    Minutes per session: Not on file  . Stress: Not on file  Relationships  . Social connections:    Talks on phone: Not on file    Gets together: Not on file    Attends religious service: Not on file    Active member of club or organization: Not on file    Attends meetings of clubs or organizations: Not on file    Relationship status: Not on file  . Intimate partner violence:    Fear of current or ex partner: Not on file    Emotionally abused: Not on file    Physically abused: Not on file    Forced sexual activity: Not on file  Other Topics Concern  . Not on file  Social History Narrative   Tends to her mother.   Denies religous beliefs effecting health care.      Allergies  Allergen Reactions  . Other Nausea Only and Other (See Comments)    Darvocet  . Azithromycin Nausea And Vomiting  . Ciprofloxacin Itching     Prior to Admission medications   Medication Sig Start Date End Date Taking? Authorizing Provider  albuterol (PROVENTIL HFA;VENTOLIN HFA) 108 (90 BASE) MCG/ACT inhaler Inhale 1-2 puffs into the lungs every 6 (six) hours as needed for wheezing or shortness of breath.    [provider]  albuterol (PROVENTIL) (2.5 MG/3ML) 0.083% nebulizer solution Inhale 2.5 mg into the lungs every 4 (four) hours as needed for wheezing or shortness of breath.  06/02/18   [provider]  amLODipine (NORVASC) 10 MG tablet Take 1 tablet (10 mg total) by mouth every morning. 02/11/17   Golden Circle, FNP  aspirin 81 MG chewable tablet Chew 81 mg by mouth daily.    [provider]  BUPAP 50-300 MG TABS Take 1 tablet by mouth every 8 (eight) hours as needed for headache. 09/01/18   [provider]  carvedilol (COREG) 6.25 MG tablet TAKE 1  TABLET BY MOUTH TWO  TIMES DAILY WITH A MEAL Patient taking differently: Take 6.25 mg by mouth 2 (two) times daily with a meal.  11/11/16   Golden Circle, FNP  Cholecalciferol (VITAMIN D PO) Take 1 tablet by mouth daily.    [provider]  clonazePAM (KLONOPIN) 1 MG tablet take 1 TABLET BY MOUTH TWICE DAILY Patient taking differently: Take 1 mg by mouth 2 (two) times daily.  02/12/17   Golden Circle, FNP  diclofenac (VOLTAREN) 75 MG EC tablet Take 75 mg by mouth 2 (two) times daily.    [provider]  diclofenac sodium (VOLTAREN) 1 % GEL Apply 1 application topically 4 (four) times daily.    [provider]  DULoxetine (CYMBALTA) 30 MG capsule Take 1 capsule (30 mg total) by mouth daily. Patient not taking: Reported on 10/27/2017 02/11/17   Golden Circle, FNP  fluticasone [facility]) 50 MCG/ACT nasal spray Place 2 sprays into both nostrils daily. Patient not taking: Reported on 09/09/2018 01/14/17   Binnie Rail, MD  gabapentin (NEURONTIN) 300 MG capsule Take 300 mg by mouth 3 (three) times daily.    [provider]  methocarbamol (ROBAXIN) 500 MG tablet Take 500 mg by mouth every 8 (eight) hours as needed for muscle spasms.    [provider]  omeprazole (PRILOSEC) 20 MG capsule TAKE 1 CAPSULE BY MOUTH  DAILY BEFORE BREAKFAST Patient taking differently: Take 20 mg by mouth daily.  11/11/16   Golden Circle, FNP  potassium chloride (K-DUR) 10 MEQ tablet Take 1 tablet (10 mEq total) by mouth daily. 01/20/17   Golden Circle, FNP  rosuvastatin (CRESTOR) 10 MG tablet Take 10 mg by mouth daily.    [provider]  SYMBICORT 80-4.5 MCG/ACT inhaler Use 2 puffs TWICE DAILY (120/4=30) Patient taking differently: Inhale 2 puffs into the lungs 2 (two) times daily.  01/20/17   Golden Circle, FNP     Depression screen [facility] 2/9 03/29/2019 10/27/2017  Decreased Interest 0 0  Down, Depressed, Hopeless 0 0  PHQ - 2 Score 0 0  Some recent  data might be hidden     Fall Risk  03/29/2019 10/27/2017  Falls in the past year? 1 Yes  Number falls in past yr: 0 1  Injury with Fall? 1 Yes  Comment - injured back  Risk for fall due to : History of fall(s) -  Follow up Falls evaluation completed;Falls prevention discussed;Education provided -      PHYSICAL EXAM: BP (!) 128/58   Ht 5' 1.5" (1.562 m)   Wt 186 lb (84.4 kg)   BMI 34.58 kg/m  This is patient reported data. This visit was conducted over the telephone with the patient.  Wt Readings from Last 3 Encounters:  03/29/19 186 lb (84.4 kg)  09/16/18 186 lb 11.2 oz (84.7 kg)  09/14/18 186 lb 11.2 oz (84.7 kg)      Education/Counseling provided regarding diet and exercise, prevention of chronic diseases, smoking/tobacco cessation, if applicable, and reviewed "Covered Medicare Preventive Services."   ASSESSMENT/PLAN:  Conducted AWV with patient over the telephone. Patient understands that this visit is taking place of an in-person visit.  Discussed clonazepam rx with patient - she had concerns about one month supply and expressed interest in 3 month supply. I encouraged her to discuss with her PCP this possibility. Pt expressed some concern regarding knowledge of COVID - 37. We discussed common sxs and when to contact a healthcare provider.   There are no diagnoses linked to this encounter.

## 2019-04-01 NOTE — Addendum Note (Signed)
Addended by: Maximiano Coss on: 04/01/2019 08:33 AM   Modules accepted: Level of Service

## 2019-04-11 DIAGNOSIS — M545 Low back pain: Secondary | ICD-10-CM | POA: Diagnosis not present

## 2019-04-19 DIAGNOSIS — M545 Low back pain: Secondary | ICD-10-CM | POA: Diagnosis not present

## 2019-04-20 ENCOUNTER — Other Ambulatory Visit: Payer: Self-pay | Admitting: Orthopedic Surgery

## 2019-04-20 DIAGNOSIS — M545 Low back pain, unspecified: Secondary | ICD-10-CM

## 2019-04-26 ENCOUNTER — Other Ambulatory Visit: Payer: Self-pay | Admitting: Nurse Practitioner

## 2019-04-26 DIAGNOSIS — Z1231 Encounter for screening mammogram for malignant neoplasm of breast: Secondary | ICD-10-CM

## 2019-05-02 DIAGNOSIS — M545 Low back pain: Secondary | ICD-10-CM | POA: Diagnosis not present

## 2019-05-17 DIAGNOSIS — R10819 Abdominal tenderness, unspecified site: Secondary | ICD-10-CM | POA: Diagnosis not present

## 2019-05-17 DIAGNOSIS — G4700 Insomnia, unspecified: Secondary | ICD-10-CM | POA: Diagnosis not present

## 2019-05-17 DIAGNOSIS — R197 Diarrhea, unspecified: Secondary | ICD-10-CM | POA: Diagnosis not present

## 2019-05-17 DIAGNOSIS — G43009 Migraine without aura, not intractable, without status migrainosus: Secondary | ICD-10-CM | POA: Diagnosis not present

## 2019-05-17 DIAGNOSIS — E782 Mixed hyperlipidemia: Secondary | ICD-10-CM | POA: Diagnosis not present

## 2019-05-17 DIAGNOSIS — D72819 Decreased white blood cell count, unspecified: Secondary | ICD-10-CM | POA: Diagnosis not present

## 2019-05-21 ENCOUNTER — Emergency Department (HOSPITAL_COMMUNITY)
Admission: EM | Admit: 2019-05-21 | Discharge: 2019-05-21 | Disposition: A | Discharge status: Home / Self Care | Payer: Medicare Other | Attending: Emergency Medicine | Admitting: Emergency Medicine

## 2019-05-21 ENCOUNTER — Emergency Department (HOSPITAL_COMMUNITY): Payer: Medicare Other

## 2019-05-21 ENCOUNTER — Other Ambulatory Visit: Payer: Self-pay

## 2019-05-21 ENCOUNTER — Encounter (HOSPITAL_COMMUNITY): Payer: Self-pay | Admitting: Emergency Medicine

## 2019-05-21 DIAGNOSIS — R109 Unspecified abdominal pain: Secondary | ICD-10-CM | POA: Diagnosis not present

## 2019-05-21 DIAGNOSIS — J449 Chronic obstructive pulmonary disease, unspecified: Secondary | ICD-10-CM | POA: Diagnosis not present

## 2019-05-21 DIAGNOSIS — M545 Low back pain, unspecified: Secondary | ICD-10-CM

## 2019-05-21 DIAGNOSIS — I2510 Atherosclerotic heart disease of native coronary artery without angina pectoris: Secondary | ICD-10-CM | POA: Insufficient documentation

## 2019-05-21 DIAGNOSIS — R1012 Left upper quadrant pain: Secondary | ICD-10-CM | POA: Diagnosis not present

## 2019-05-21 DIAGNOSIS — R1032 Left lower quadrant pain: Secondary | ICD-10-CM | POA: Insufficient documentation

## 2019-05-21 DIAGNOSIS — Z79899 Other long term (current) drug therapy: Secondary | ICD-10-CM | POA: Diagnosis not present

## 2019-05-21 DIAGNOSIS — Z7982 Long term (current) use of aspirin: Secondary | ICD-10-CM | POA: Diagnosis not present

## 2019-05-21 DIAGNOSIS — I10 Essential (primary) hypertension: Secondary | ICD-10-CM | POA: Diagnosis not present

## 2019-05-21 DIAGNOSIS — M5127 Other intervertebral disc displacement, lumbosacral region: Secondary | ICD-10-CM | POA: Diagnosis not present

## 2019-05-21 DIAGNOSIS — K5730 Diverticulosis of large intestine without perforation or abscess without bleeding: Secondary | ICD-10-CM | POA: Diagnosis not present

## 2019-05-21 LAB — CBC
HCT: 41.2 % (ref 36.0–46.0)
Hemoglobin: 13 g/dL (ref 12.0–15.0)
MCH: 26.8 pg (ref 26.0–34.0)
MCHC: 31.6 g/dL (ref 30.0–36.0)
MCV: 84.9 fL (ref 80.0–100.0)
Platelets: 233 10*3/uL (ref 150–400)
RBC: 4.85 MIL/uL (ref 3.87–5.11)
RDW: 13.3 % (ref 11.5–15.5)
WBC: 2.9 10*3/uL — ABNORMAL LOW (ref 4.0–10.5)
nRBC: 0 % (ref 0.0–0.2)

## 2019-05-21 LAB — COMPREHENSIVE METABOLIC PANEL
ALT: 17 U/L (ref 0–44)
AST: 26 U/L (ref 15–41)
Albumin: 4 g/dL (ref 3.5–5.0)
Alkaline Phosphatase: 103 U/L (ref 38–126)
Anion gap: 9 (ref 5–15)
BUN: 8 mg/dL (ref 8–23)
CO2: 26 mmol/L (ref 22–32)
Calcium: 9.7 mg/dL (ref 8.9–10.3)
Chloride: 104 mmol/L (ref 98–111)
Creatinine, Ser: 0.84 mg/dL (ref 0.44–1.00)
GFR calc Af Amer: >60 mL/min (ref >60)
GFR calc non Af Amer: >60 mL/min (ref >60)
Glucose, Bld: 98 mg/dL (ref 70–99)
Potassium: 3.5 mmol/L (ref 3.5–5.1)
Sodium: 139 mmol/L (ref 135–145)
Total Bilirubin: 0.5 mg/dL (ref 0.3–1.2)
Total Protein: 7.6 g/dL (ref 6.5–8.1)

## 2019-05-21 LAB — URINALYSIS, ROUTINE W REFLEX MICROSCOPIC
Bilirubin Urine: NEGATIVE
Glucose, UA: NEGATIVE mg/dL
Hgb urine dipstick: NEGATIVE
Ketones, ur: NEGATIVE mg/dL
Leukocytes,Ua: NEGATIVE
Nitrite: NEGATIVE
Protein, ur: NEGATIVE mg/dL
Specific Gravity, Urine: 1.002 — ABNORMAL LOW (ref 1.005–1.030)
pH: 7 (ref 5.0–8.0)

## 2019-05-21 LAB — LIPASE, BLOOD: Lipase: 32 U/L (ref 11–51)

## 2019-05-21 MED ORDER — SODIUM CHLORIDE 0.9% FLUSH: 3.0000 mL | Freq: Once | INTRAVENOUS | Status: DC

## 2019-05-21 MED ORDER — IOHEXOL 300 MG/ML  SOLN
100.0000 mL | Freq: Once | INTRAMUSCULAR | Status: AC | PRN
  Administered 2019-05-21: 100 mL via INTRAVENOUS

## 2019-05-21 MED ORDER — HYDROCODONE-ACETAMINOPHEN 5-325 MG PO TABS: 1.0000 | ORAL_TABLET | Freq: Four times a day (QID) | ORAL | 0 refills | Status: DC | PRN

## 2019-05-21 NOTE — ED Notes (Signed)
Patient verbalizes understanding of discharge instructions. Opportunity for questioning and answers were provided. Armband removed by staff, pt discharged from ED.  

## 2019-05-21 NOTE — ED Provider Notes (Signed)
[place] EMERGENCY DEPARTMENT Provider Note   CSN: [id number] Arrival date & time: 05/21/19  1334    History   Chief Complaint Chief Complaint  Patient presents with  . Flank Pain  . Abdominal Pain  . Diarrhea    HPI Catherine [name] is a 63 y.o. female with history of hypertension, COPD who presents with left lower quadrant, left flank pain with associated diarrhea for the past few months.  Patient denies any nausea, vomiting, fevers.  Patient has had some difficulty urinating since her back surgery in September 2019, however she is no bowel or bladder incontinence.  She denies any saddle anesthesia.  CT was ordered by her surgeon in June to assess her lumbar spine.     HPI  Past Medical History:  Diagnosis Date  . Anxiety   . Arthritis   . CAD (coronary artery disease)   . COPD (chronic obstructive pulmonary disease) ([place])   . Depression   . Headache    migraines  . Hypertension   . Lumbar spinal stenosis   . Obesity   . Spondylolisthesis at L4-L5 level   . Vertigo   . Wears glasses     Patient Active Problem List   Diagnosis Date Noted  . Spinal stenosis 09/16/2018  . Sinusitis, acute maxillary 12/30/2016  . Dizziness 12/30/2016  . Sinusitis 12/30/2016  . Proteinuria 05/29/2016  . Dysuria 04/03/2016  . Soft tissue lesion of foot 04/03/2016  . Obesity   . Vaginal discharge 10/02/2015  . Other bursal cyst, left ankle and foot 07/19/2015  . Anxiety and depression 05/04/2015  . LEUKOCYTOSIS 10/31/2010  . HYPOKALEMIA 09/18/2010  . GERD 09/18/2010  . TRANSAMINASES, SERUM, ELEVATED 04/08/2010  . DEPRESSION, PROLONGED 12/04/2009  . ANEMIA 11/07/2009  . CHEST PAIN, ATYPICAL 03/28/2009  . SEDIMENTATION RATE, ELEVATED 01/05/2009  . ABSCESS, TOOTH 12/27/2008  . HEPATIC CYST 12/25/2008  . Migraine 05/27/2007  . HYPERLIPIDEMIA 03/10/2007  . CORONARY ARTERY DISEASE 02/27/2007  . ANXIETY DISORDER, GENERALIZED 10/12/2006  . VERTIGO, BENIGN  PAROXYSMAL POSITION 10/12/2006  . Essential hypertension 10/12/2006  . DENTAL CARIES 10/12/2006  . INSOMNIA 10/12/2006    Past Surgical History:  Procedure Laterality Date  . CARDIAC CATHETERIZATION    . COLONOSCOPY W/ BIOPSIES AND POLYPECTOMY    . MULTIPLE TOOTH EXTRACTIONS    . TRANSFORAMINAL LUMBAR INTERBODY FUSION (TLIF) WITH PEDICLE SCREW FIXATION 1 LEVEL Left 09/16/2018   Procedure: LEFT-SIDED LUMBAR 4-5 TRANSFORAMINAL LUMBAR INTERBODY FUSION WITH INSTRUMENTATION AND ALLOGRAFT;  Surgeon: Phylliss Bob, MD;  Location: [place];  Service: Orthopedics;  Laterality: Left;  . TUBAL LIGATION       OB History   No obstetric history on file.      Home Medications    Prior to Admission medications   Medication Sig Start Date End Date Taking? Authorizing Provider  albuterol (PROVENTIL HFA;VENTOLIN HFA) 108 (90 BASE) MCG/ACT inhaler Inhale 1-2 puffs into the lungs every 6 (six) hours as needed for wheezing or shortness of breath.   Yes [provider]  albuterol (PROVENTIL) (2.5 MG/3ML) 0.083% nebulizer solution Inhale 2.5 mg into the lungs every 4 (four) hours as needed for wheezing or shortness of breath.  06/02/18  Yes [provider]  amLODipine (NORVASC) 10 MG tablet Take 1 tablet (10 mg total) by mouth every morning. 02/11/17  Yes Golden Circle, FNP  aspirin 81 MG chewable tablet Chew 81 mg by mouth daily.   Yes [provider]  BUPAP 50-300 MG TABS Take  1 tablet by mouth every 8 (eight) hours as needed for headache. 09/01/18  Yes [provider]  carvedilol (COREG) 6.25 MG tablet TAKE 1 TABLET BY MOUTH TWO  TIMES DAILY WITH A MEAL Patient taking differently: Take 6.25 mg by mouth 2 (two) times daily with a meal.  11/11/16  Yes Golden Circle, FNP  Cholecalciferol (VITAMIN D PO) Take 1 tablet by mouth daily.   Yes [provider]  clonazePAM (KLONOPIN) 1 MG tablet take 1 TABLET BY MOUTH TWICE DAILY Patient taking differently: Take 1 mg  by mouth 2 (two) times daily.  02/12/17  Yes Golden Circle, FNP  diclofenac sodium (VOLTAREN) 1 % GEL Apply 1 application topically 4 (four) times daily as needed (pain).    Yes [provider]  gabapentin (NEURONTIN) 300 MG capsule Take 300 mg by mouth 3 (three) times daily as needed (pain).    Yes [provider]  meloxicam (MOBIC) 15 MG tablet Take 15 mg by mouth daily as needed for pain. 12/27/18  Yes [provider]  methocarbamol (ROBAXIN) 500 MG tablet Take 500 mg by mouth every 8 (eight) hours as needed for muscle spasms.   Yes [provider]  omeprazole (PRILOSEC) 20 MG capsule TAKE 1 CAPSULE BY MOUTH  DAILY BEFORE BREAKFAST Patient taking differently: Take 20 mg by mouth daily.  11/11/16  Yes Golden Circle, FNP  potassium chloride (K-DUR) 10 MEQ tablet Take 1 tablet (10 mEq total) by mouth daily. 01/20/17  Yes Golden Circle, FNP  rosuvastatin (CRESTOR) 10 MG tablet Take 10 mg by mouth daily.   Yes [provider]  SYMBICORT 80-4.5 MCG/ACT inhaler Use 2 puffs TWICE DAILY (120/4=30) Patient taking differently: Inhale 2 puffs into the lungs 2 (two) times daily.  01/20/17  Yes Golden Circle, FNP  zolpidem (AMBIEN) 5 MG tablet Take 5 mg by mouth at bedtime as needed for sleep. 03/14/19  Yes [provider]  DULoxetine (CYMBALTA) 30 MG capsule Take 1 capsule (30 mg total) by mouth daily. Patient not taking: Reported on 10/27/2017 02/11/17   Golden Circle, FNP  fluticasone [facility]) 50 MCG/ACT nasal spray Place 2 sprays into both nostrils daily. Patient not taking: Reported on 09/09/2018 01/14/17   Binnie Rail, MD  HYDROcodone-acetaminophen (NORCO/VICODIN) 5-325 MG tablet Take 1 tablet by mouth every 6 (six) hours as needed for severe pain. 05/21/19   Frederica Kuster, PA-C    Family History Family History  Problem Relation Age of Onset  . Kidney disease Mother   . Seizures Mother   . Heart failure Father   . Colon cancer  Neg Hx   . Breast cancer Neg Hx     Social History Social History   Tobacco Use  . Smoking status: Never Smoker  . Smokeless tobacco: Never Used  Substance Use Topics  . Alcohol use: No  . Drug use: No     Allergies   Other; Azithromycin; and Ciprofloxacin   Review of Systems Review of Systems  Constitutional: Negative for chills and fever.  HENT: Negative for facial swelling and sore throat.   Respiratory: Negative for shortness of breath.   Cardiovascular: Negative for chest pain.  Gastrointestinal: Positive for abdominal pain and diarrhea. Negative for nausea and vomiting.  Genitourinary: Positive for flank pain. Negative for dysuria.  Musculoskeletal: Positive for back pain.  Skin: Negative for rash and wound.  Neurological: Negative for headaches.  Psychiatric/Behavioral: The patient is not nervous/anxious.  Physical Exam Updated Vital Signs BP 115/84   Pulse 61   Temp 98.6 F (37 C) (Oral)   Resp 18   Ht 5\' 1"  (1.549 m)   Wt 83 kg   SpO2 98%   BMI 34.58 kg/m   Physical Exam Vitals signs and nursing note reviewed.  Constitutional:      General: She is not in acute distress.    Appearance: She is well-developed. She is not diaphoretic.  HENT:     Head: Normocephalic and atraumatic.     Mouth/Throat:     Pharynx: No oropharyngeal exudate.  Eyes:     General: No scleral icterus.       Right eye: No discharge.        Left eye: No discharge.     Conjunctiva/sclera: Conjunctivae normal.     Pupils: Pupils are equal, round, and reactive to light.  Neck:     Musculoskeletal: Normal range of motion and neck supple.     Thyroid: No thyromegaly.  Cardiovascular:     Rate and Rhythm: Normal rate and regular rhythm.     Heart sounds: Normal heart sounds. No murmur. No friction rub. No gallop.   Pulmonary:     Effort: Pulmonary effort is normal. No respiratory distress.     Breath sounds: Normal breath sounds. No stridor. No wheezing or rales.   Abdominal:     General: Bowel sounds are normal. There is no distension.     Palpations: Abdomen is soft.     Tenderness: There is abdominal tenderness in the left upper quadrant. There is no guarding or rebound.    Lymphadenopathy:     Cervical: No cervical adenopathy.  Skin:    General: Skin is warm and dry.     Coloration: Skin is not pale.     Findings: No rash.  Neurological:     Mental Status: She is alert.     Coordination: Coordination normal.     Comments: CN 3-12 intact; normal sensation throughout; 5/5 strength in all 4 extremities; equal bilateral grip strength      ED Treatments / Results  Labs (all labs ordered are listed, but only abnormal results are displayed) Labs Reviewed  CBC - Abnormal; Notable for the following components:      Result Value   WBC 2.9 (*)    All other components within normal limits  URINALYSIS, ROUTINE W REFLEX MICROSCOPIC - Abnormal; Notable for the following components:   Color, Urine COLORLESS (*)    Specific Gravity, Urine 1.002 (*)    All other components within normal limits  LIPASE, BLOOD  COMPREHENSIVE METABOLIC PANEL    EKG None  Radiology Ct Abdomen Pelvis W Contrast  Result Date: 05/21/2019 CLINICAL DATA:  Left lower quadrant abdominal and flank pain EXAM: CT ABDOMEN AND PELVIS WITH CONTRAST TECHNIQUE: Multidetector CT imaging of the abdomen and pelvis was performed using the standard protocol following bolus administration of intravenous contrast. CONTRAST:  133mL OMNIPAQUE IOHEXOL 300 MG/ML  SOLN COMPARISON:  05/21/2019 FINDINGS: Lower chest: No acute abnormality. Hepatobiliary: Mild hepatic steatosis with diffuse hypoattenuation of the liver parenchyma. Scattered hepatic hypodensities, largest in the central liver measures 2.6 cm favored to represent hepatic cysts. No biliary dilatation. Patent hepatic and portal veins. Gallbladder biliary system unremarkable. Common bile duct nondilated. Pancreas: Unremarkable. No  pancreatic ductal dilatation or surrounding inflammatory changes. Spleen: Normal in size without focal abnormality. Adrenals/Urinary Tract: Adrenal glands are unremarkable. Kidneys are normal, without renal calculi, focal lesion,  or hydronephrosis. Bladder is unremarkable. Stomach/Bowel: Negative for bowel obstruction, significant dilatation, ileus, free air. Normal appearing appendix. Scattered colonic diverticulosis. Distal colon is collapsed. No definite acute inflammatory process. No free fluid, fluid collection, abscess, ascites, hemorrhage, or hematoma. Vascular/Lymphatic: Mild atherosclerosis tortuosity of the aortoiliac vessels. Mesenteric and renal vasculature appear patent. No veno-occlusive process. No bulky adenopathy. Reproductive: Uterus and bilateral adnexa are unremarkable. Other: No abdominal wall hernia or abnormality. No abdominopelvic ascites. Musculoskeletal: Previous lower lumbar fusion. Additional lumbar degenerative changes. No acute osseous finding. IMPRESSION: No acute intra-abdominopelvic finding. Diverticulosis without acute inflammatory process Electronically Signed   By: Jerilynn Mages.  Shick M.D.   On: 05/21/2019 16:22   Ct L-spine No Charge  Result Date: 05/21/2019 CLINICAL DATA:  Left side low back pain. History of prior lumbar surgery. EXAM: CT LUMBAR SPINE WITHOUT CONTRAST TECHNIQUE: Multidetector CT imaging of the lumbar spine was performed without intravenous contrast administration. Multiplanar CT image reconstructions were also generated. COMPARISON:  MRI lumbar spine 04/19/2019. CT lumbar spine 10/23/2018. FINDINGS: Segmentation: Standard. Alignment: Maintained. Vertebrae: No fracture or focal lesion. The patient is status post L4-5 discectomy and fusion as described below. Paraspinal and other soft tissues: See report of dedicated abdomen and pelvis CT scan this same day. Disc levels: T12-L1: Negative. L1-2: Negative. L2-3: Negative. L3-4: Shallow left paracentral protrusion without  stenosis. L4-5: The patient is status post laminectomy and fusion. There has been some subsidence of the interbody spacer into the L4-5 endplates. There has been partial resorption of the interbody spacer compared to the prior CT. No fusion across the disc interspace is identified although there is a small amount of incorporation of the spacer into the superior endplate of L5. No lucency about the pedicle screws is identified. As on the prior CT, the right pedicle screws breach the periphery of the L4 and L5 vertebral bodies. L5-S1: Mild disc bulge without stenosis. IMPRESSION: No acute finding. Status post L4-5 discectomy and fusion. There has been partial resorption of the interbody spacer and no bridging bone is seen across the disc interspace. There is some incorporation of the interbody spacer into the superior endplate of L5. Mild subsidence of the spacer into the endplates is noted. Pedicle screws are intact without loosening. Electronically Signed   By: Inge Rise M.D.   On: 05/21/2019 16:20    Procedures Procedures (including critical care time)  Medications Ordered in ED Medications  iohexol (OMNIPAQUE) 300 MG/ML solution 100 mL (100 mLs Intravenous Contrast Given 05/21/19 1529)     Initial Impression / Assessment and Plan / ED Course  I have reviewed the triage vital signs and the nursing notes.  Pertinent labs & imaging results that were available during my care of the patient were reviewed by me and considered in my medical decision making (see chart for details).        Patient presenting with a several month history of left flank and left lower quadrant pain.  She has had associated diarrhea.  Labs are unremarkable.  CT abdomen pelvis shows diverticulosis without acute inflammatory process.  CT lumbar shows s/p L4-5 discectomy and fusion with partial resorption of the interbody spacer and no bridging bone across the disc interspace; some incorporation of the interbody spacer  into the superior endplate of L5; some mild subsidence of the spacer into the endplates noted.  Patient will follow-up with orthopedic surgeon for this.  No findings to explain left flank pain, question nerve pain. No evidence of cauda equina. Will discharge with short  course of Norco. Return precautions discussed. Patient vitals stable throughout ED course and discharged in satisfactory condition.    Final Clinical Impressions(s) / ED Diagnoses   Final diagnoses:  Low back pain  Flank pain  Left lower quadrant abdominal pain    ED Discharge Orders         Ordered    HYDROcodone-acetaminophen (NORCO/VICODIN) 5-325 MG tablet  Every 6 hours PRN     05/21/19 1639           Frederica Kuster, PA-C 05/22/19 Howard, Ankit, MD 05/22/19 1004

## 2019-05-21 NOTE — ED Notes (Signed)
Urine culture collected and sent down to main lab 

## 2019-05-21 NOTE — Discharge Instructions (Addendum)
Take 1 Vicodin every 6 hours as needed for severe pain.  Do not combine this with Tylenol #3.  Do not drive or operate machinery while taking this medication.  Please follow-up with your doctor for further evaluation and treatment of this.  Please follow-up with Dr. Lynann Bologna to discuss the results of the CT of your lumbar spine (you do not need to repeat this on 05/30/2019).  The results are below.  Please return to the emergency department if you develop any new or worsening symptoms.  CT lumbar spine without contrast: Status post L4-5 discectomy and fusion. There has been partial resorption of the interbody spacer and no bridging bone is seen across the disc interspace. There is some incorporation of the interbody spacer into the superior endplate of L5. Mild subsidence of the spacer into the endplates is noted. Pedicle screws are intact without loosening.  Do not drink alcohol, drive, operate machinery or participate in any other potentially dangerous activities while taking opiate pain medication as it may make you sleepy. Do not take this medication with any other sedating medications, either prescription or over-the-counter. If you were prescribed Percocet or Vicodin, do not take these with acetaminophen (Tylenol) as it is already contained within these medications and overdose of Tylenol is dangerous.   This medication is an opiate (or narcotic) pain medication and can be habit forming.  Use it as little as possible to achieve adequate pain control.  Do not use or use it with extreme caution if you have a history of opiate abuse or dependence. This medication is intended for your use only - do not give any to anyone else and keep it in a secure place where nobody else, especially children, have access to it. It will also cause or worsen constipation, so you may want to consider taking an over-the-counter stool softener while you are taking this medication.

## 2019-05-21 NOTE — ED Triage Notes (Signed)
Pt. Stated, This has been going on for a couple of months, I will have left side pain and it will go to the front and then I will have bouts of diarrhea, go away and come back a couple of times. Then ok until it comes back again.

## 2019-05-21 NOTE — ED Notes (Signed)
Patient transported to CT 

## 2019-05-27 DIAGNOSIS — M545 Low back pain: Secondary | ICD-10-CM | POA: Diagnosis not present

## 2019-05-30 ENCOUNTER — Other Ambulatory Visit: Payer: Medicare Other

## 2019-05-31 DIAGNOSIS — Z09 Encounter for follow-up examination after completed treatment for conditions other than malignant neoplasm: Secondary | ICD-10-CM | POA: Diagnosis not present

## 2019-05-31 DIAGNOSIS — R109 Unspecified abdominal pain: Secondary | ICD-10-CM | POA: Diagnosis not present

## 2019-06-01 DIAGNOSIS — Z79899 Other long term (current) drug therapy: Secondary | ICD-10-CM | POA: Diagnosis not present

## 2019-06-01 DIAGNOSIS — Z981 Arthrodesis status: Secondary | ICD-10-CM | POA: Diagnosis not present

## 2019-06-01 DIAGNOSIS — M545 Low back pain: Secondary | ICD-10-CM | POA: Diagnosis not present

## 2019-06-01 DIAGNOSIS — G8929 Other chronic pain: Secondary | ICD-10-CM | POA: Diagnosis not present

## 2019-06-01 DIAGNOSIS — M47816 Spondylosis without myelopathy or radiculopathy, lumbar region: Secondary | ICD-10-CM | POA: Diagnosis not present

## 2019-06-05 ENCOUNTER — Encounter (HOSPITAL_COMMUNITY): Payer: Self-pay | Admitting: Emergency Medicine

## 2019-06-05 ENCOUNTER — Other Ambulatory Visit: Payer: Self-pay

## 2019-06-05 ENCOUNTER — Emergency Department (HOSPITAL_COMMUNITY)
Admission: EM | Admit: 2019-06-05 | Discharge: 2019-06-05 | Disposition: A | Discharge status: Home / Self Care | Payer: Medicare Other | Attending: Emergency Medicine | Admitting: Emergency Medicine

## 2019-06-05 DIAGNOSIS — Z7982 Long term (current) use of aspirin: Secondary | ICD-10-CM | POA: Diagnosis not present

## 2019-06-05 DIAGNOSIS — Z79899 Other long term (current) drug therapy: Secondary | ICD-10-CM | POA: Insufficient documentation

## 2019-06-05 DIAGNOSIS — I10 Essential (primary) hypertension: Secondary | ICD-10-CM | POA: Diagnosis not present

## 2019-06-05 DIAGNOSIS — L02411 Cutaneous abscess of right axilla: Secondary | ICD-10-CM | POA: Diagnosis not present

## 2019-06-05 DIAGNOSIS — L02419 Cutaneous abscess of limb, unspecified: Secondary | ICD-10-CM

## 2019-06-05 DIAGNOSIS — I2510 Atherosclerotic heart disease of native coronary artery without angina pectoris: Secondary | ICD-10-CM | POA: Diagnosis not present

## 2019-06-05 DIAGNOSIS — J449 Chronic obstructive pulmonary disease, unspecified: Secondary | ICD-10-CM | POA: Insufficient documentation

## 2019-06-05 MED ORDER — LIDOCAINE-EPINEPHRINE (PF) 2 %-1:200000 IJ SOLN
10.0000 mL | Freq: Once | INTRAMUSCULAR | Status: AC
  Administered 2019-06-05: 10 mL
  Filled 2019-06-05: qty 20

## 2019-06-05 MED ORDER — DOXYCYCLINE HYCLATE 100 MG PO CAPS: 100.0000 mg | ORAL_CAPSULE | Freq: Two times a day (BID) | ORAL | 0 refills | Status: AC

## 2019-06-05 MED ORDER — HYDROCODONE-ACETAMINOPHEN 5-325 MG PO TABS
1.0000 | ORAL_TABLET | Freq: Once | ORAL | Status: AC
  Administered 2019-06-05: 12:00:00 1 via ORAL
  Filled 2019-06-05: qty 1

## 2019-06-05 NOTE — ED Provider Notes (Signed)
[facility] EMERGENCY DEPARTMENT Provider Note   CSN: [id number] Arrival date & time: 06/05/19  1114     History   Chief Complaint Chief Complaint  Patient presents with  . Abscess    HPI Catherine [name] is a 63 y.o. female.     HPI  Patient is a 63 year old female with past medical history of hypertension, COPD, CAD, anxiety presenting for pain and swelling on the right axilla.  Patient reports that she first noticed the swelling 2 days ago.  Patient reports that the area is primarily firm but not red.  She denies any drainage from the site.  She denies any history of similar occurrences.  She denies any fevers, chills, joint pain, nausea, vomiting, or rashes.  Patient denies any breast lumps or breast induration.  Patient denies home remedies for her symptoms. Pt is not diabetic or prediabetic.  Past Medical History:  Diagnosis Date  . Anxiety   . Arthritis   . CAD (coronary artery disease)   . COPD (chronic obstructive pulmonary disease) ([place])   . Depression   . Headache    migraines  . Hypertension   . Lumbar spinal stenosis   . Obesity   . Spondylolisthesis at L4-L5 level   . Vertigo   . Wears glasses     Patient Active Problem List   Diagnosis Date Noted  . Spinal stenosis 09/16/2018  . Sinusitis, acute maxillary 12/30/2016  . Dizziness 12/30/2016  . Sinusitis 12/30/2016  . Proteinuria 05/29/2016  . Dysuria 04/03/2016  . Soft tissue lesion of foot 04/03/2016  . Obesity   . Vaginal discharge 10/02/2015  . Other bursal cyst, left ankle and foot 07/19/2015  . Anxiety and depression 05/04/2015  . LEUKOCYTOSIS 10/31/2010  . HYPOKALEMIA 09/18/2010  . GERD 09/18/2010  . TRANSAMINASES, SERUM, ELEVATED 04/08/2010  . DEPRESSION, PROLONGED 12/04/2009  . ANEMIA 11/07/2009  . CHEST PAIN, ATYPICAL 03/28/2009  . SEDIMENTATION RATE, ELEVATED 01/05/2009  . ABSCESS, TOOTH 12/27/2008  . HEPATIC CYST 12/25/2008  . Migraine 05/27/2007  .  HYPERLIPIDEMIA 03/10/2007  . CORONARY ARTERY DISEASE 02/27/2007  . ANXIETY DISORDER, GENERALIZED 10/12/2006  . VERTIGO, BENIGN PAROXYSMAL POSITION 10/12/2006  . Essential hypertension 10/12/2006  . DENTAL CARIES 10/12/2006  . INSOMNIA 10/12/2006    Past Surgical History:  Procedure Laterality Date  . CARDIAC CATHETERIZATION    . COLONOSCOPY W/ BIOPSIES AND POLYPECTOMY    . MULTIPLE TOOTH EXTRACTIONS    . TRANSFORAMINAL LUMBAR INTERBODY FUSION (TLIF) WITH PEDICLE SCREW FIXATION 1 LEVEL Left 09/16/2018   Procedure: LEFT-SIDED LUMBAR 4-5 TRANSFORAMINAL LUMBAR INTERBODY FUSION WITH INSTRUMENTATION AND ALLOGRAFT;  Surgeon: Phylliss Bob, MD;  Location: [facility];  Service: Orthopedics;  Laterality: Left;  . TUBAL LIGATION       OB History   No obstetric history on file.      Home Medications    Prior to Admission medications   Medication Sig Start Date End Date Taking? Authorizing Provider  albuterol (PROVENTIL HFA;VENTOLIN HFA) 108 (90 BASE) MCG/ACT inhaler Inhale 1-2 puffs into the lungs every 6 (six) hours as needed for wheezing or shortness of breath.    [provider]  albuterol (PROVENTIL) (2.5 MG/3ML) 0.083% nebulizer solution Inhale 2.5 mg into the lungs every 4 (four) hours as needed for wheezing or shortness of breath.  06/02/18   [provider]  amLODipine (NORVASC) 10 MG tablet Take 1 tablet (10 mg total) by mouth every morning. 02/11/17   Golden Circle, FNP  aspirin 81  MG chewable tablet Chew 81 mg by mouth daily.    [provider]  BUPAP 50-300 MG TABS Take 1 tablet by mouth every 8 (eight) hours as needed for headache. 09/01/18   [provider]  carvedilol (COREG) 6.25 MG tablet TAKE 1 TABLET BY MOUTH TWO  TIMES DAILY WITH A MEAL Patient taking differently: Take 6.25 mg by mouth 2 (two) times daily with a meal.  11/11/16   Golden Circle, FNP  Cholecalciferol (VITAMIN D PO) Take 1 tablet by mouth daily.    [provider]   clonazePAM (KLONOPIN) 1 MG tablet take 1 TABLET BY MOUTH TWICE DAILY Patient taking differently: Take 1 mg by mouth 2 (two) times daily.  02/12/17   Golden Circle, FNP  diclofenac sodium (VOLTAREN) 1 % GEL Apply 1 application topically 4 (four) times daily as needed (pain).     [provider]  DULoxetine (CYMBALTA) 30 MG capsule Take 1 capsule (30 mg total) by mouth daily. Patient not taking: Reported on 10/27/2017 02/11/17   Golden Circle, FNP  fluticasone [facility]) 50 MCG/ACT nasal spray Place 2 sprays into both nostrils daily. Patient not taking: Reported on 09/09/2018 01/14/17   Binnie Rail, MD  gabapentin (NEURONTIN) 300 MG capsule Take 300 mg by mouth 3 (three) times daily as needed (pain).     [provider]  HYDROcodone-acetaminophen (NORCO/VICODIN) 5-325 MG tablet Take 1 tablet by mouth every 6 (six) hours as needed for severe pain. 05/21/19   Law, Bea Graff, PA-C  meloxicam (MOBIC) 15 MG tablet Take 15 mg by mouth daily as needed for pain. 12/27/18   [provider]  methocarbamol (ROBAXIN) 500 MG tablet Take 500 mg by mouth every 8 (eight) hours as needed for muscle spasms.    [provider]  omeprazole (PRILOSEC) 20 MG capsule TAKE 1 CAPSULE BY MOUTH  DAILY BEFORE BREAKFAST Patient taking differently: Take 20 mg by mouth daily.  11/11/16   Golden Circle, FNP  potassium chloride (K-DUR) 10 MEQ tablet Take 1 tablet (10 mEq total) by mouth daily. 01/20/17   Golden Circle, FNP  rosuvastatin (CRESTOR) 10 MG tablet Take 10 mg by mouth daily.    [provider]  SYMBICORT 80-4.5 MCG/ACT inhaler Use 2 puffs TWICE DAILY (120/4=30) Patient taking differently: Inhale 2 puffs into the lungs 2 (two) times daily.  01/20/17   Golden Circle, FNP  zolpidem (AMBIEN) 5 MG tablet Take 5 mg by mouth at bedtime as needed for sleep. 03/14/19   [provider]    Family History Family History  Problem Relation Age of Onset  . Kidney  disease Mother   . Seizures Mother   . Heart failure Father   . Colon cancer Neg Hx   . Breast cancer Neg Hx     Social History Social History   Tobacco Use  . Smoking status: Never Smoker  . Smokeless tobacco: Never Used  Substance Use Topics  . Alcohol use: No  . Drug use: No     Allergies   Other, Azithromycin, and Ciprofloxacin   Review of Systems Review of Systems  Constitutional: Negative for chills and fever.  Gastrointestinal: Negative for abdominal pain, nausea and vomiting.  Skin:       +Lump under right axilla  Allergic/Immunologic: Negative for immunocompromised state.  All other systems reviewed and are negative.    Physical Exam Updated Vital Signs BP (!) 144/85 (BP Location: Left Arm)   Pulse Marland Kitchen)  101   Temp 99.2 F (37.3 C) (Oral)   Resp 20   Ht 5\' 1"  (1.549 m)   Wt 83.5 kg   SpO2 98%   BMI 34.77 kg/m   Physical Exam Vitals signs and nursing note reviewed.  Constitutional:      General: She is not in acute distress.    Appearance: She is well-developed. She is not diaphoretic.     Comments: Sitting comfortably in bed.  HENT:     Head: Normocephalic and atraumatic.  Eyes:     General:        Right eye: No discharge.        Left eye: No discharge.     Conjunctiva/sclera: Conjunctivae normal.     Comments: EOMs normal to gross examination.  Neck:     Musculoskeletal: Normal range of motion.  Cardiovascular:     Rate and Rhythm: Normal rate and regular rhythm.     Comments: Intact, 2+ radial pulse. Nontachycardic on my exam.  Pulmonary:     Effort: Pulmonary effort is normal.     Breath sounds: Normal breath sounds. No wheezing or rales.  Abdominal:     General: There is no distension.  Musculoskeletal: Normal range of motion.  Skin:    General: Skin is warm and dry.     Comments: Right axillary exam: Patient has an area approximately 3 cm in length by 2 cm in width of induration.  No erythema.  No active drainage.  Area tender to  touch.  Surrounding tissue soft. No breast erythema or breast abscess noted.   Neurological:     Mental Status: She is alert.     Comments: Cranial nerves intact to gross observation. Patient moves extremities without difficulty.  Psychiatric:        Behavior: Behavior normal.        Thought Content: Thought content normal.        Judgment: Judgment normal.      ED Treatments / Results  Labs (all labs ordered are listed, but only abnormal results are displayed) Labs Reviewed - No data to display  EKG    Radiology No results found.  Procedures .Marland KitchenIncision and Drainage  Date/Time: 06/05/2019 12:53 PM Performed by: Albesa Seen, PA-C Authorized by: Albesa Seen, PA-C   Consent:    Consent obtained:  Verbal   Consent given by:  Patient   Risks discussed:  Bleeding, incomplete drainage and pain   Alternatives discussed:  No treatment Location:    Type:  Abscess   Location: Right axilla. Pre-procedure details:    Skin preparation:  Betadine and Chloraprep Anesthesia (see MAR for exact dosages):    Anesthesia method:  Local infiltration   Local anesthetic:  Lidocaine 2% WITH epi Procedure type:    Complexity:  Simple Procedure details:    Needle aspiration: yes     Needle gauge: 21 G.   Incision types:  Single straight   Incision depth:  Dermal   Scalpel blade:  11   Wound management:  Probed and deloculated   Drainage:  Purulent and bloody   Drainage amount:  Scant   Wound treatment:  Wound left open   Packing materials:  None Post-procedure details:    Patient tolerance of procedure:  Tolerated well, no immediate complications   (including critical care time)  EMERGENCY DEPARTMENT US SOFT TISSUE INTERPRETATION "Study: Limited Soft Tissue Ultrasound"  INDICATIONS: Pain and Soft tissue infection Multiple views of the body part were obtained  in real-time with a multi-frequency linear probe PERFORMED BY:  Myself IMAGES ARCHIVED?: Yes SIDE:Right   BODY PART:Axilla FINDINGS: Other Well circumscribed lymph node with possible surrounding fluid. No large fluid collection.  INTERPRETATION:  Well circumscribed lymph node with possible surrounding fluid. No large fluid collection.    CPT: Neck [id number]  Upper extremity [id number]  Axilla [id number]  Chest wall 37106-26  Beast 94854-62  Upper back 70350-09  Lower back 38182-99  Abdominal wall 37169-67  Pelvic wall 89381-01  Lower extremity 75102-58  Other soft tissue 52778-24   Medications Ordered in ED Medications - No data to display   Initial Impression / Assessment and Plan / ED Course  I have reviewed the triage vital signs and the nursing notes.  Pertinent labs & imaging results that were available during my care of the patient were reviewed by me and considered in my medical decision making (see chart for details).  Clinical Course as of Jun 04 1157  Sun Jun 05, 2019  1157 Patient verbally verified a safe ride from the ED. Proceeded with prescribing Norco for pain/relaxtion/muscle relaxation in the ED.   [AM]    Clinical Course User Index [AM] Albesa Seen, PA-C      This is a well-appearing 63 year old female presenting for induration pain to the right axilla.  Differential diagnosis includes inflamed lymph node versus abscess.  Ultrasound was performed at bedside which reveals possible lymph node surrounded by fluid. No large abscess. Will perform US guided needle aspiration and determine need for I & D based on return of fluid or not.   Aspiration returned purulent fluid.  Small incision placed overlying the area of induration with drainage of purulent material.  Cavity not large enough for packing.  Patient be placed on doxycycline.  She is instructed on the side effects of this medication. Patient was already prescribed Norco by a previous provider on 6/10.   This is a shared visit with Dr. Ellyn Hack. Patient was independently evaluated by this  attending physician. Attending physician consulted in evaluation and discharge management.  Final Clinical Impressions(s) / ED Diagnoses   Final diagnoses:  Axillary abscess    ED Discharge Orders         Ordered    doxycycline (VIBRAMYCIN) 100 MG capsule  2 times daily     06/05/19 1258           Tamala Julian 06/05/19 1259    Jola Schmidt, MD 06/05/19 1459

## 2019-06-05 NOTE — Discharge Instructions (Addendum)
Please see the information and instructions below regarding your visit.  Your diagnoses today include:  1. Axillary abscess     Abscesses form when an infection in your skin starts to collect bacteria and white blood cells, walling it off from the rest of your body to protect you from a bigger infection. Risk factors for this type of infection include:  ?Break in the skin ?Diabetes ?Swollen areas  Sometimes the infection starts to spread to surrounding tissue, causing redness and swelling. We call this cellulitis.   Tests performed today include: See side panel of your discharge paperwork for testing performed today. Vital signs are listed at the bottom of these instructions.   Medications prescribed:    Take any prescribed medications only as prescribed, and any over the counter medications only as directed on the packaging.  1. Please take all of your antibiotics until finished.   You may develop abdominal discomfort or nausea from the antibiotic. If this occurs, you may take it with food. Some patients also get diarrhea with antibiotics. You may help offset this with probiotics which you can buy or get in yogurt. Do not eat or take the probiotics until 2 hours after your antibiotic. Some women develop vaginal yeast infections after antibiotics. If you develop unusual vaginal discharge after being on this medication, please see your primary care provider.   Some people develop allergies to antibiotics. Symptoms of antibiotic allergy can be mild and include a flat rash and itching. They can also be more serious and include:  ?Hives - Hives are raised, red patches of skin that are usually very itchy.  ?Lip or tongue swelling  ?Trouble swallowing or breathing  ?Blistering of the skin or mouth.  If you have any of these serious symptoms, please seek emergency medical care immediately.    2.  You may use the Norco prescribed your primary care provider sparingly.  You may take Norco  1 tab every 4-6 hours as needed  Home care instructions:  Please follow any educational materials contained in this packet.   Some things that may promote healing of your wound and infection include:  Raise your arm or leg to reduce swelling - Raise the arm or leg up above the level of your heart 3 or 4 times a day, for 30 minutes each time. Keep the infected area clean and dry. You can take a shower or bath, but be sure to pat the area dry with a towel afterward. Do not put any antibiotic ointments or creams on the area. Reapply a dry gauze dressing any time the bandage has become soaked with drainage, or after cleansing the wound.  Apply warm compresses to the wound 3-4 times daily to encourage drainage.   Return instructions:  Please return to the Emergency Department if you experience worsening symptoms. You should return for reevaluation of your infection if you notice spreading redness, increased swelling, an abscess develops, or you develop signs and symptoms of a systemic illness such as fever and chills.  Please return if you have any other emergent concerns.  Additional Information:   Your vital signs today were: BP (!) 144/85 (BP Location: Left Arm)    Pulse (!) 101    Temp 99.2 F (37.3 C) (Oral)    Resp 20    Ht 5\' 1"  (1.549 m)    Wt 83.5 kg    SpO2 98%    BMI 34.77 kg/m  If your blood pressure (BP) was elevated on multiple  readings during this visit above 130 for the top number or above 80 for the bottom number, please have this repeated by your primary care provider within one month. --------------  Thank you for allowing [place] to participate in your care today.

## 2019-06-05 NOTE — ED Notes (Signed)
Alyssa PA at bedside performing bedside procedure

## 2019-06-05 NOTE — ED Triage Notes (Signed)
Pt presents with abscess/boil under R axila; pt states she noticed it a couple days ago when bathing; pt denies drainage; temp today 99

## 2019-06-06 DIAGNOSIS — M545 Low back pain: Secondary | ICD-10-CM | POA: Diagnosis not present

## 2019-06-13 DIAGNOSIS — G8929 Other chronic pain: Secondary | ICD-10-CM | POA: Diagnosis not present

## 2019-06-13 DIAGNOSIS — Z79899 Other long term (current) drug therapy: Secondary | ICD-10-CM | POA: Diagnosis not present

## 2019-06-13 DIAGNOSIS — M129 Arthropathy, unspecified: Secondary | ICD-10-CM | POA: Diagnosis not present

## 2019-06-13 DIAGNOSIS — E559 Vitamin D deficiency, unspecified: Secondary | ICD-10-CM | POA: Diagnosis not present

## 2019-06-13 DIAGNOSIS — M47816 Spondylosis without myelopathy or radiculopathy, lumbar region: Secondary | ICD-10-CM | POA: Diagnosis not present

## 2019-06-13 DIAGNOSIS — I10 Essential (primary) hypertension: Secondary | ICD-10-CM | POA: Diagnosis not present

## 2019-06-13 DIAGNOSIS — Z1159 Encounter for screening for other viral diseases: Secondary | ICD-10-CM | POA: Diagnosis not present

## 2019-06-13 DIAGNOSIS — M545 Low back pain: Secondary | ICD-10-CM | POA: Diagnosis not present

## 2019-06-17 ENCOUNTER — Ambulatory Visit
Admission: RE | Admit: 2019-06-17 | Discharge: 2019-06-17 | Disposition: A | Discharge status: Home / Self Care | Payer: Medicare Other | Source: Ambulatory Visit | Attending: Nurse Practitioner | Admitting: Nurse Practitioner

## 2019-06-17 ENCOUNTER — Other Ambulatory Visit: Payer: Self-pay

## 2019-06-17 DIAGNOSIS — Z1231 Encounter for screening mammogram for malignant neoplasm of breast: Secondary | ICD-10-CM

## 2019-06-21 ENCOUNTER — Ambulatory Visit: Payer: Medicare Other

## 2019-06-27 DIAGNOSIS — Z79899 Other long term (current) drug therapy: Secondary | ICD-10-CM | POA: Diagnosis not present

## 2019-06-27 DIAGNOSIS — G8929 Other chronic pain: Secondary | ICD-10-CM | POA: Diagnosis not present

## 2019-06-27 DIAGNOSIS — M545 Low back pain: Secondary | ICD-10-CM | POA: Diagnosis not present

## 2019-06-29 DIAGNOSIS — M0579 Rheumatoid arthritis with rheumatoid factor of multiple sites without organ or systems involvement: Secondary | ICD-10-CM | POA: Diagnosis not present

## 2019-06-29 DIAGNOSIS — M79641 Pain in right hand: Secondary | ICD-10-CM | POA: Diagnosis not present

## 2019-06-29 DIAGNOSIS — M3500 Sicca syndrome, unspecified: Secondary | ICD-10-CM | POA: Diagnosis not present

## 2019-06-29 DIAGNOSIS — M1990 Unspecified osteoarthritis, unspecified site: Secondary | ICD-10-CM | POA: Diagnosis not present

## 2019-06-29 DIAGNOSIS — M7989 Other specified soft tissue disorders: Secondary | ICD-10-CM | POA: Diagnosis not present

## 2019-06-29 DIAGNOSIS — M1811 Unilateral primary osteoarthritis of first carpometacarpal joint, right hand: Secondary | ICD-10-CM | POA: Diagnosis not present

## 2019-06-29 DIAGNOSIS — M79672 Pain in left foot: Secondary | ICD-10-CM | POA: Diagnosis not present

## 2019-06-29 DIAGNOSIS — M19071 Primary osteoarthritis, right ankle and foot: Secondary | ICD-10-CM | POA: Diagnosis not present

## 2019-06-29 DIAGNOSIS — M19072 Primary osteoarthritis, left ankle and foot: Secondary | ICD-10-CM | POA: Diagnosis not present

## 2019-06-29 DIAGNOSIS — M79671 Pain in right foot: Secondary | ICD-10-CM | POA: Diagnosis not present

## 2019-06-29 DIAGNOSIS — M1812 Unilateral primary osteoarthritis of first carpometacarpal joint, left hand: Secondary | ICD-10-CM | POA: Diagnosis not present

## 2019-06-29 DIAGNOSIS — M79643 Pain in unspecified hand: Secondary | ICD-10-CM | POA: Diagnosis not present

## 2019-06-29 DIAGNOSIS — M79642 Pain in left hand: Secondary | ICD-10-CM | POA: Diagnosis not present

## 2019-07-11 DIAGNOSIS — G8929 Other chronic pain: Secondary | ICD-10-CM | POA: Diagnosis not present

## 2019-07-11 DIAGNOSIS — L02419 Cutaneous abscess of limb, unspecified: Secondary | ICD-10-CM | POA: Diagnosis not present

## 2019-07-11 DIAGNOSIS — Z79899 Other long term (current) drug therapy: Secondary | ICD-10-CM | POA: Diagnosis not present

## 2019-07-11 DIAGNOSIS — M545 Low back pain: Secondary | ICD-10-CM | POA: Diagnosis not present

## 2019-07-25 DIAGNOSIS — M545 Low back pain: Secondary | ICD-10-CM | POA: Diagnosis not present

## 2019-07-25 DIAGNOSIS — Z79899 Other long term (current) drug therapy: Secondary | ICD-10-CM | POA: Diagnosis not present

## 2019-07-25 DIAGNOSIS — G8929 Other chronic pain: Secondary | ICD-10-CM | POA: Diagnosis not present

## 2019-07-27 DIAGNOSIS — M0579 Rheumatoid arthritis with rheumatoid factor of multiple sites without organ or systems involvement: Secondary | ICD-10-CM | POA: Diagnosis not present

## 2019-07-27 DIAGNOSIS — M1990 Unspecified osteoarthritis, unspecified site: Secondary | ICD-10-CM | POA: Diagnosis not present

## 2019-07-27 DIAGNOSIS — M79643 Pain in unspecified hand: Secondary | ICD-10-CM | POA: Diagnosis not present

## 2019-08-09 DIAGNOSIS — Z79899 Other long term (current) drug therapy: Secondary | ICD-10-CM | POA: Diagnosis not present

## 2019-08-09 DIAGNOSIS — G8929 Other chronic pain: Secondary | ICD-10-CM | POA: Diagnosis not present

## 2019-08-09 DIAGNOSIS — M545 Low back pain: Secondary | ICD-10-CM | POA: Diagnosis not present

## 2019-08-23 DIAGNOSIS — G8929 Other chronic pain: Secondary | ICD-10-CM | POA: Diagnosis not present

## 2019-08-23 DIAGNOSIS — M545 Low back pain: Secondary | ICD-10-CM | POA: Diagnosis not present

## 2019-08-23 DIAGNOSIS — Z79899 Other long term (current) drug therapy: Secondary | ICD-10-CM | POA: Diagnosis not present

## 2019-09-07 ENCOUNTER — Encounter (HOSPITAL_COMMUNITY): Payer: Self-pay

## 2019-09-07 ENCOUNTER — Other Ambulatory Visit: Payer: Self-pay

## 2019-09-07 ENCOUNTER — Emergency Department (HOSPITAL_COMMUNITY)
Admission: EM | Admit: 2019-09-07 | Discharge: 2019-09-07 | Disposition: A | Discharge status: Home / Self Care | Payer: Medicare Other | Attending: Emergency Medicine | Admitting: Emergency Medicine

## 2019-09-07 ENCOUNTER — Emergency Department (HOSPITAL_COMMUNITY): Payer: Medicare Other

## 2019-09-07 DIAGNOSIS — Z79899 Other long term (current) drug therapy: Secondary | ICD-10-CM | POA: Diagnosis not present

## 2019-09-07 DIAGNOSIS — R1030 Lower abdominal pain, unspecified: Secondary | ICD-10-CM | POA: Diagnosis not present

## 2019-09-07 DIAGNOSIS — J449 Chronic obstructive pulmonary disease, unspecified: Secondary | ICD-10-CM | POA: Diagnosis not present

## 2019-09-07 DIAGNOSIS — R300 Dysuria: Secondary | ICD-10-CM

## 2019-09-07 DIAGNOSIS — R197 Diarrhea, unspecified: Secondary | ICD-10-CM | POA: Insufficient documentation

## 2019-09-07 DIAGNOSIS — K5900 Constipation, unspecified: Secondary | ICD-10-CM | POA: Diagnosis not present

## 2019-09-07 DIAGNOSIS — I2510 Atherosclerotic heart disease of native coronary artery without angina pectoris: Secondary | ICD-10-CM | POA: Diagnosis not present

## 2019-09-07 DIAGNOSIS — Z7982 Long term (current) use of aspirin: Secondary | ICD-10-CM | POA: Insufficient documentation

## 2019-09-07 DIAGNOSIS — I10 Essential (primary) hypertension: Secondary | ICD-10-CM | POA: Diagnosis not present

## 2019-09-07 LAB — URINALYSIS, ROUTINE W REFLEX MICROSCOPIC
Bacteria, UA: NONE SEEN
Bilirubin Urine: NEGATIVE
Glucose, UA: NEGATIVE mg/dL
Hgb urine dipstick: NEGATIVE
Ketones, ur: NEGATIVE mg/dL
Nitrite: NEGATIVE
Protein, ur: NEGATIVE mg/dL
Specific Gravity, Urine: 1.005 (ref 1.005–1.030)
pH: 8 (ref 5.0–8.0)

## 2019-09-07 LAB — CBC
HCT: 41.8 % (ref 36.0–46.0)
Hemoglobin: 13.3 g/dL (ref 12.0–15.0)
MCH: 27.4 pg (ref 26.0–34.0)
MCHC: 31.8 g/dL (ref 30.0–36.0)
MCV: 86.2 fL (ref 80.0–100.0)
Platelets: 290 10*3/uL (ref 150–400)
RBC: 4.85 MIL/uL (ref 3.87–5.11)
RDW: 13.8 % (ref 11.5–15.5)
WBC: 3.1 10*3/uL — ABNORMAL LOW (ref 4.0–10.5)
nRBC: 0 % (ref 0.0–0.2)

## 2019-09-07 LAB — COMPREHENSIVE METABOLIC PANEL
ALT: 15 U/L (ref 0–44)
AST: 25 U/L (ref 15–41)
Albumin: 4 g/dL (ref 3.5–5.0)
Alkaline Phosphatase: 79 U/L (ref 38–126)
Anion gap: 11 (ref 5–15)
BUN: 7 mg/dL — ABNORMAL LOW (ref 8–23)
CO2: 22 mmol/L (ref 22–32)
Calcium: 10 mg/dL (ref 8.9–10.3)
Chloride: 106 mmol/L (ref 98–111)
Creatinine, Ser: 0.83 mg/dL (ref 0.44–1.00)
GFR calc Af Amer: >60 mL/min (ref >60)
GFR calc non Af Amer: >60 mL/min (ref >60)
Glucose, Bld: 99 mg/dL (ref 70–99)
Potassium: 3.8 mmol/L (ref 3.5–5.1)
Sodium: 139 mmol/L (ref 135–145)
Total Bilirubin: 0.5 mg/dL (ref 0.3–1.2)
Total Protein: 8 g/dL (ref 6.5–8.1)

## 2019-09-07 LAB — LIPASE, BLOOD: Lipase: 32 U/L (ref 11–51)

## 2019-09-07 MED ORDER — AMOXICILLIN-POT CLAVULANATE 875-125 MG PO TABS: 1.0000 | ORAL_TABLET | Freq: Two times a day (BID) | ORAL | 0 refills | Status: DC

## 2019-09-07 MED ORDER — IOHEXOL 300 MG/ML  SOLN
100.0000 mL | Freq: Once | INTRAMUSCULAR | Status: AC | PRN
  Administered 2019-09-07: 100 mL via INTRAVENOUS

## 2019-09-07 NOTE — ED Notes (Signed)
Patient verbalizes understanding of discharge instructions. Opportunity for questioning and answers were provided. Armband removed by staff, pt discharged from ED.  

## 2019-09-07 NOTE — ED Provider Notes (Signed)
[facility] EMERGENCY DEPARTMENT Provider Note   CSN: [id number] Arrival date & time: 09/07/19  1148     History   Chief Complaint Chief Complaint  Patient presents with  . Abdominal Pain    HPI Catherine [name] is a 63 y.o. female.  She has history of COPD hypertension CAD.  She is complaining of 2 weeks of crampy low abdominal pain and abdominal distention.  She said she has been having intermittent bouts of constipation and diarrhea.  She has had some nausea but no fever no vomiting.  She has a urine burns a little bit.  She has not seen any blood from above or below.  She also noticed that her legs have felt generally weak and shaky.     The history is provided by the patient.  Abdominal Pain Pain location:  LLQ and RLQ Pain quality: cramping   Pain radiates to:  Does not radiate Pain severity:  Moderate Onset quality:  Gradual Duration:  2 weeks Timing:  Intermittent Progression:  Unchanged Chronicity:  New Context: not recent travel, not sick contacts and not trauma   Relieved by:  Nothing Worsened by:  Nothing Ineffective treatments:  None tried Associated symptoms: constipation, diarrhea, dysuria and nausea   Associated symptoms: no chest pain, no chills, no cough, no fever, no hematemesis, no hematochezia, no hematuria, no melena, no shortness of breath, no sore throat, no vaginal bleeding and no vomiting     Past Medical History:  Diagnosis Date  . Anxiety   . Arthritis   . CAD (coronary artery disease)   . COPD (chronic obstructive pulmonary disease) ([place])   . Depression   . Headache    migraines  . Hypertension   . Lumbar spinal stenosis   . Obesity   . Spondylolisthesis at L4-L5 level   . Vertigo   . Wears glasses     Patient Active Problem List   Diagnosis Date Noted  . Spinal stenosis 09/16/2018  . Sinusitis, acute maxillary 12/30/2016  . Dizziness 12/30/2016  . Sinusitis 12/30/2016  . Proteinuria 05/29/2016  . Dysuria  04/03/2016  . Soft tissue lesion of foot 04/03/2016  . Obesity   . Vaginal discharge 10/02/2015  . Other bursal cyst, left ankle and foot 07/19/2015  . Anxiety and depression 05/04/2015  . LEUKOCYTOSIS 10/31/2010  . HYPOKALEMIA 09/18/2010  . GERD 09/18/2010  . TRANSAMINASES, SERUM, ELEVATED 04/08/2010  . DEPRESSION, PROLONGED 12/04/2009  . ANEMIA 11/07/2009  . CHEST PAIN, ATYPICAL 03/28/2009  . SEDIMENTATION RATE, ELEVATED 01/05/2009  . ABSCESS, TOOTH 12/27/2008  . HEPATIC CYST 12/25/2008  . Migraine 05/27/2007  . HYPERLIPIDEMIA 03/10/2007  . CORONARY ARTERY DISEASE 02/27/2007  . ANXIETY DISORDER, GENERALIZED 10/12/2006  . VERTIGO, BENIGN PAROXYSMAL POSITION 10/12/2006  . Essential hypertension 10/12/2006  . DENTAL CARIES 10/12/2006  . INSOMNIA 10/12/2006    Past Surgical History:  Procedure Laterality Date  . CARDIAC CATHETERIZATION    . COLONOSCOPY W/ BIOPSIES AND POLYPECTOMY    . MULTIPLE TOOTH EXTRACTIONS    . TRANSFORAMINAL LUMBAR INTERBODY FUSION (TLIF) WITH PEDICLE SCREW FIXATION 1 LEVEL Left 09/16/2018   Procedure: LEFT-SIDED LUMBAR 4-5 TRANSFORAMINAL LUMBAR INTERBODY FUSION WITH INSTRUMENTATION AND ALLOGRAFT;  Surgeon: Phylliss Bob, MD;  Location: [facility];  Service: Orthopedics;  Laterality: Left;  . TUBAL LIGATION       OB History   No obstetric history on file.      Home Medications    Prior to Admission medications   Medication Sig Start  Date End Date Taking? Authorizing Provider  albuterol (PROVENTIL HFA;VENTOLIN HFA) 108 (90 BASE) MCG/ACT inhaler Inhale 1-2 puffs into the lungs every 6 (six) hours as needed for wheezing or shortness of breath.    [provider]  albuterol (PROVENTIL) (2.5 MG/3ML) 0.083% nebulizer solution Inhale 2.5 mg into the lungs every 4 (four) hours as needed for wheezing or shortness of breath.  06/02/18   [provider]  amLODipine (NORVASC) 10 MG tablet Take 1 tablet (10 mg total) by mouth every morning. 02/11/17    Golden Circle, FNP  aspirin 81 MG chewable tablet Chew 81 mg by mouth daily.    [provider]  BUPAP 50-300 MG TABS Take 1 tablet by mouth every 8 (eight) hours as needed for headache. 09/01/18   [provider]  carvedilol (COREG) 6.25 MG tablet TAKE 1 TABLET BY MOUTH TWO  TIMES DAILY WITH A MEAL Patient taking differently: Take 6.25 mg by mouth 2 (two) times daily with a meal.  11/11/16   Golden Circle, FNP  Cholecalciferol (VITAMIN D PO) Take 1 tablet by mouth daily.    [provider]  clonazePAM (KLONOPIN) 1 MG tablet take 1 TABLET BY MOUTH TWICE DAILY Patient taking differently: Take 1 mg by mouth 2 (two) times daily.  02/12/17   Golden Circle, FNP  diclofenac sodium (VOLTAREN) 1 % GEL Apply 1 application topically 4 (four) times daily as needed (pain).     [provider]  DULoxetine (CYMBALTA) 30 MG capsule Take 1 capsule (30 mg total) by mouth daily. Patient not taking: Reported on 10/27/2017 02/11/17   Golden Circle, FNP  fluticasone [facility]) 50 MCG/ACT nasal spray Place 2 sprays into both nostrils daily. Patient not taking: Reported on 09/09/2018 01/14/17   Binnie Rail, MD  gabapentin (NEURONTIN) 300 MG capsule Take 300 mg by mouth 3 (three) times daily as needed (pain).     [provider]  HYDROcodone-acetaminophen (NORCO/VICODIN) 5-325 MG tablet Take 1 tablet by mouth every 6 (six) hours as needed for severe pain. 05/21/19   Law, Bea Graff, PA-C  meloxicam (MOBIC) 15 MG tablet Take 15 mg by mouth daily as needed for pain. 12/27/18   [provider]  methocarbamol (ROBAXIN) 500 MG tablet Take 500 mg by mouth every 8 (eight) hours as needed for muscle spasms.    [provider]  omeprazole (PRILOSEC) 20 MG capsule TAKE 1 CAPSULE BY MOUTH  DAILY BEFORE BREAKFAST Patient taking differently: Take 20 mg by mouth daily.  11/11/16   Golden Circle, FNP  potassium chloride (K-DUR) 10 MEQ tablet Take 1 tablet (10  mEq total) by mouth daily. 01/20/17   Golden Circle, FNP  rosuvastatin (CRESTOR) 10 MG tablet Take 10 mg by mouth daily.    [provider]  SYMBICORT 80-4.5 MCG/ACT inhaler Use 2 puffs TWICE DAILY (120/4=30) Patient taking differently: Inhale 2 puffs into the lungs 2 (two) times daily.  01/20/17   Golden Circle, FNP  zolpidem (AMBIEN) 5 MG tablet Take 5 mg by mouth at bedtime as needed for sleep. 03/14/19   [provider]    Family History Family History  Problem Relation Age of Onset  . Kidney disease Mother   . Seizures Mother   . Heart failure Father   . Colon cancer Neg Hx   . Breast cancer Neg Hx     Social History Social History   Tobacco Use  . Smoking status: Never Smoker  .  Smokeless tobacco: Never Used  Substance Use Topics  . Alcohol use: No  . Drug use: No     Allergies   Other, Azithromycin, and Ciprofloxacin   Review of Systems Review of Systems  Constitutional: Negative for chills and fever.  HENT: Negative for sore throat.   Eyes: Negative for visual disturbance.  Respiratory: Negative for cough and shortness of breath.   Cardiovascular: Negative for chest pain.  Gastrointestinal: Positive for abdominal pain, constipation, diarrhea and nausea. Negative for hematemesis, hematochezia, melena and vomiting.  Genitourinary: Positive for dysuria. Negative for hematuria and vaginal bleeding.  Musculoskeletal: Negative for neck pain.  Skin: Negative for rash.  Neurological: Negative for headaches.     Physical Exam Updated Vital Signs BP (!) 150/85 (BP Location: Right Arm)   Pulse 66   Temp 98.6 F (37 C) (Oral)   Resp 14   Ht 5\' 1"  (1.549 m)   Wt 82.7 kg   SpO2 98%   BMI 34.46 kg/m   Physical Exam Vitals signs and nursing note reviewed.  Constitutional:      General: She is not in acute distress.    Appearance: She is well-developed.  HENT:     Head: Normocephalic and atraumatic.  Eyes:     Conjunctiva/sclera:  Conjunctivae normal.  Neck:     Musculoskeletal: Neck supple.  Cardiovascular:     Rate and Rhythm: Normal rate and regular rhythm.     Heart sounds: No murmur.  Pulmonary:     Effort: Pulmonary effort is normal. No respiratory distress.     Breath sounds: Normal breath sounds.  Abdominal:     Palpations: Abdomen is soft.     Tenderness: There is abdominal tenderness in the right lower quadrant, suprapubic area and left lower quadrant. There is no guarding or rebound.  Musculoskeletal: Normal range of motion.     Right lower leg: No edema.     Left lower leg: No edema.  Skin:    General: Skin is warm and dry.     Capillary Refill: Capillary refill takes less than 2 seconds.  Neurological:     General: No focal deficit present.     Mental Status: She is alert and oriented to person, place, and time.     Sensory: No sensory deficit.     Motor: No weakness.      ED Treatments / Results  Labs (all labs ordered are listed, but only abnormal results are displayed) Labs Reviewed  COMPREHENSIVE METABOLIC PANEL - Abnormal; Notable for the following components:      Result Value   BUN 7 (*)    All other components within normal limits  CBC - Abnormal; Notable for the following components:   WBC 3.1 (*)    All other components within normal limits  URINALYSIS, ROUTINE W REFLEX MICROSCOPIC - Abnormal; Notable for the following components:   Color, Urine STRAW (*)    Leukocytes,Ua TRACE (*)    All other components within normal limits  LIPASE, BLOOD    EKG None  Radiology Ct Abdomen Pelvis W Contrast  Result Date: 09/07/2019 CLINICAL DATA:  Abdominal bloating, diarrhea, burning during urination. EXAM: CT ABDOMEN AND PELVIS WITH CONTRAST TECHNIQUE: Multidetector CT imaging of the abdomen and pelvis was performed using the standard protocol following bolus administration of intravenous contrast. CONTRAST:  112mL OMNIPAQUE IOHEXOL 300 MG/ML  SOLN COMPARISON:  05/21/2019, 07/31/2011  FINDINGS: Lower chest: No acute abnormality. Hepatobiliary: Stable low-density lesions within the liver. Gallbladder unremarkable. No  intrahepatic biliary dilatation. Pancreas: Unremarkable. No pancreatic ductal dilatation or surrounding inflammatory changes. Spleen: Normal in size without focal abnormality. Adrenals/Urinary Tract: Unremarkable adrenal glands. Kidneys enhance symmetrically without focal lesion or hydronephrosis. Abnormally thickened appearance of the anterior wall of the urinary bladder with adjacent fat stranding (series 3, image 68). Stomach/Bowel: Stomach is within normal limits. Scattered colonic diverticulosis. No evidence of bowel wall thickening, distention, or inflammatory changes. Vascular/Lymphatic: No significant vascular findings are present. No enlarged abdominal or pelvic lymph nodes. Reproductive: Uterus and bilateral adnexa are unremarkable. Other: No abdominal wall hernia or abnormality. No abdominopelvic ascites. Musculoskeletal: Prior fusion of L4-5.  No acute osseous findings. IMPRESSION: 1. Abnormally thickened appearance of the anterior urinary bladder wall with adjacent fat stranding suspicious for cystitis. 2. Colonic diverticulosis without evidence of acute diverticulitis. Electronically Signed   By: Davina Poke M.D.   On: 09/07/2019 15:47    Procedures Procedures (including critical care time)  Medications Ordered in ED Medications  iohexol (OMNIPAQUE) 300 MG/ML solution 100 mL (100 mLs Intravenous Contrast Given 09/07/19 1525)     Initial Impression / Assessment and Plan / ED Course  I have reviewed the triage vital signs and the nursing notes.  Pertinent labs & imaging results that were available during my care of the patient were reviewed by me and considered in my medical decision making (see chart for details).  Clinical Course as of Sep 07 1723  Wed Sep 07, 2019  1554 Patient here with crampy low abdominal pain alternating diarrhea constipation  and some urinary symptoms.  Her labs are only significant for a mildly depressed white count of 3.1.  Urine unremarkable.  CT commenting upon diverticulosis but no gross evidence of diverticulitis and also some bladder thickening correlate with urinalysis.  She mostly sounds like this could be case some diverticulitis so even though no evidence CAT scan will cover with some Augmentin.  She understands to follow-up with her doctor return if any worsening symptoms.   [MB]    Clinical Course User Index [MB] Hayden Rasmussen, MD        Final Clinical Impressions(s) / ED Diagnoses   Final diagnoses:  Lower abdominal pain  Dysuria    ED Discharge Orders         Ordered    amoxicillin-clavulanate (AUGMENTIN) 875-125 MG tablet  Every 12 hours     09/07/19 1556           Hayden Rasmussen, MD 09/07/19 1724

## 2019-09-07 NOTE — ED Notes (Signed)
Patient transported to CT 

## 2019-09-07 NOTE — Discharge Instructions (Addendum)
You were seen in the emergency department for lower abdominal pain constipation diarrhea and some urinary symptoms.  You had blood work and a CAT scan that did not show an obvious cause of your symptoms.  We are covering you with a course of antibiotics which she should finish.  Please drink plenty of fluids.  Follow-up with your doctor return if any worsening symptoms.

## 2019-09-07 NOTE — ED Triage Notes (Signed)
Pt arrives POV for eval of abd pain and bloating x 2 weeks. Pt reports diarrhea, denies N/V. Denies hematemesis or melena. Reports pain is worse w/ bowel movements. Endorses loss of appetite

## 2019-10-01 ENCOUNTER — Other Ambulatory Visit: Payer: Self-pay

## 2019-10-01 ENCOUNTER — Emergency Department (HOSPITAL_COMMUNITY)
Admission: EM | Admit: 2019-10-01 | Discharge: 2019-10-01 | Disposition: A | Discharge status: Home / Self Care | Payer: Medicare Other | Attending: Emergency Medicine | Admitting: Emergency Medicine

## 2019-10-01 ENCOUNTER — Encounter (HOSPITAL_COMMUNITY): Payer: Self-pay

## 2019-10-01 ENCOUNTER — Emergency Department (HOSPITAL_COMMUNITY): Payer: Medicare Other

## 2019-10-01 DIAGNOSIS — Z79899 Other long term (current) drug therapy: Secondary | ICD-10-CM | POA: Insufficient documentation

## 2019-10-01 DIAGNOSIS — R0602 Shortness of breath: Secondary | ICD-10-CM | POA: Diagnosis not present

## 2019-10-01 DIAGNOSIS — I10 Essential (primary) hypertension: Secondary | ICD-10-CM | POA: Diagnosis not present

## 2019-10-01 DIAGNOSIS — N390 Urinary tract infection, site not specified: Secondary | ICD-10-CM

## 2019-10-01 DIAGNOSIS — M25511 Pain in right shoulder: Secondary | ICD-10-CM | POA: Diagnosis present

## 2019-10-01 DIAGNOSIS — Z7982 Long term (current) use of aspirin: Secondary | ICD-10-CM | POA: Insufficient documentation

## 2019-10-01 DIAGNOSIS — J449 Chronic obstructive pulmonary disease, unspecified: Secondary | ICD-10-CM | POA: Insufficient documentation

## 2019-10-01 DIAGNOSIS — I2510 Atherosclerotic heart disease of native coronary artery without angina pectoris: Secondary | ICD-10-CM | POA: Diagnosis not present

## 2019-10-01 DIAGNOSIS — R197 Diarrhea, unspecified: Secondary | ICD-10-CM | POA: Insufficient documentation

## 2019-10-01 LAB — COMPREHENSIVE METABOLIC PANEL
ALT: 18 U/L (ref 0–44)
AST: 24 U/L (ref 15–41)
Albumin: 4 g/dL (ref 3.5–5.0)
Alkaline Phosphatase: 73 U/L (ref 38–126)
Anion gap: 10 (ref 5–15)
BUN: 7 mg/dL — ABNORMAL LOW (ref 8–23)
CO2: 22 mmol/L (ref 22–32)
Calcium: 10.3 mg/dL (ref 8.9–10.3)
Chloride: 104 mmol/L (ref 98–111)
Creatinine, Ser: 1.05 mg/dL — ABNORMAL HIGH (ref 0.44–1.00)
GFR calc Af Amer: >60 mL/min (ref >60)
GFR calc non Af Amer: 56 mL/min — ABNORMAL LOW (ref >60)
Glucose, Bld: 144 mg/dL — ABNORMAL HIGH (ref 70–99)
Potassium: 4.3 mmol/L (ref 3.5–5.1)
Sodium: 136 mmol/L (ref 135–145)
Total Bilirubin: 0.4 mg/dL (ref 0.3–1.2)
Total Protein: 7.8 g/dL (ref 6.5–8.1)

## 2019-10-01 LAB — URINALYSIS, ROUTINE W REFLEX MICROSCOPIC
Bilirubin Urine: NEGATIVE
Glucose, UA: NEGATIVE mg/dL
Hgb urine dipstick: NEGATIVE
Ketones, ur: NEGATIVE mg/dL
Nitrite: NEGATIVE
Protein, ur: NEGATIVE mg/dL
Renal Epithelial: 1
Specific Gravity, Urine: 1.008 (ref 1.005–1.030)
pH: 7 (ref 5.0–8.0)

## 2019-10-01 LAB — CBC WITH DIFFERENTIAL/PLATELET
Abs Immature Granulocytes: 0.01 10*3/uL (ref 0.00–0.07)
Basophils Absolute: 0 10*3/uL (ref 0.0–0.1)
Basophils Relative: 0 %
Eosinophils Absolute: 0.1 10*3/uL (ref 0.0–0.5)
Eosinophils Relative: 2 %
HCT: 42.8 % (ref 36.0–46.0)
Hemoglobin: 13.4 g/dL (ref 12.0–15.0)
Immature Granulocytes: 0 %
Lymphocytes Relative: 20 %
Lymphs Abs: 0.9 10*3/uL (ref 0.7–4.0)
MCH: 27.1 pg (ref 26.0–34.0)
MCHC: 31.3 g/dL (ref 30.0–36.0)
MCV: 86.6 fL (ref 80.0–100.0)
Monocytes Absolute: 0.5 10*3/uL (ref 0.1–1.0)
Monocytes Relative: 11 %
Neutro Abs: 2.9 10*3/uL (ref 1.7–7.7)
Neutrophils Relative %: 67 %
Platelets: 268 10*3/uL (ref 150–400)
RBC: 4.94 MIL/uL (ref 3.87–5.11)
RDW: 13.8 % (ref 11.5–15.5)
WBC: 4.2 10*3/uL (ref 4.0–10.5)
nRBC: 0 % (ref 0.0–0.2)

## 2019-10-01 LAB — TROPONIN I (HIGH SENSITIVITY)
Troponin I (High Sensitivity): 4 ng/L (ref <18)
Troponin I (High Sensitivity): 5 ng/L (ref <18)

## 2019-10-01 LAB — LIPASE, BLOOD: Lipase: 30 U/L (ref 11–51)

## 2019-10-01 MED ORDER — SODIUM CHLORIDE 0.9 % IV BOLUS: 1000.0000 mL | Freq: Once | INTRAVENOUS | Status: DC

## 2019-10-01 MED ORDER — CEPHALEXIN 500 MG PO CAPS: 500.0000 mg | ORAL_CAPSULE | Freq: Two times a day (BID) | ORAL | 0 refills | Status: AC

## 2019-10-01 NOTE — ED Provider Notes (Signed)
[facility] EMERGENCY DEPARTMENT Provider Note   CSN: [id number] Arrival date & time: 10/01/19  1137     History   Chief Complaint Chief Complaint  Patient presents with  . Shoulder Pain  . Nausea  . Abdominal Pain    HPI Kalysta Streitmatter is a 63 y.o. female with PMHx COPD, CAD, HTN, Arthritis, Anxiety who presents to the ED today with multiple complaints.   Pt currently complaining of sudden onset, constant, achy/sharp, right shoulder pain x 1 week. No known injury or overuse prior to the pain beginning.  Reports she is unable to lift her right arm up above her shoulder due to the pain.  She states that she sometimes has to use her left arm to help pick up the right arm as well.  No obvious weakness.  Patient denies dropping random objects that she typically holds in her right arm.  Patient has not been taking anything for her symptoms. She states she feels short of breath as well but attributes it more to her anxiety than anything else. Denies chest pain.   She is also complaining of gradual onset, intermittent, achy, suprapubic abdominal pain with nausea and diarrhea for the past 2 to 3 weeks.  She states that she was seen in the ED for this on 9/16 and was given antibiotics that relief.  She states that she recently switched primary care providers and saw them but did not mention her ED visit to them.  States she will not see them again until 10/29 states that she would like something for the nausea. ED workup on 09/16: Reassuring labwork without leukocytosis. No electrolyte abnormalities. Urine did shoe trace leuks but otherwise 0-5 WBCs per HPFs. CT scan with 1. Abnormally thickened appearance of the anterior urinary bladder wall with adjacent fat stranding suspicious for cystitis. 2. Colonic diverticulosis without evidence of acute diverticulitis. Pt was treated with Augmentin with concern for diverticulitis despite no evidence of CT scan. She states she finished her  course of abx without relief. Pt denies heavy EtOH use. She states her diarrhea began prior to the abx. She is not sexually active. No vaginal discharge or pelvic pain.        Past Medical History:  Diagnosis Date  . Anxiety   . Arthritis   . CAD (coronary artery disease)   . COPD (chronic obstructive pulmonary disease) ([place])   . Depression   . Headache    migraines  . Hypertension   . Lumbar spinal stenosis   . Obesity   . Spondylolisthesis at L4-L5 level   . Vertigo   . Wears glasses     Patient Active Problem List   Diagnosis Date Noted  . Spinal stenosis 09/16/2018  . Sinusitis, acute maxillary 12/30/2016  . Dizziness 12/30/2016  . Sinusitis 12/30/2016  . Proteinuria 05/29/2016  . Dysuria 04/03/2016  . Soft tissue lesion of foot 04/03/2016  . Obesity   . Vaginal discharge 10/02/2015  . Other bursal cyst, left ankle and foot 07/19/2015  . Anxiety and depression 05/04/2015  . LEUKOCYTOSIS 10/31/2010  . HYPOKALEMIA 09/18/2010  . GERD 09/18/2010  . TRANSAMINASES, SERUM, ELEVATED 04/08/2010  . DEPRESSION, PROLONGED 12/04/2009  . ANEMIA 11/07/2009  . CHEST PAIN, ATYPICAL 03/28/2009  . SEDIMENTATION RATE, ELEVATED 01/05/2009  . ABSCESS, TOOTH 12/27/2008  . HEPATIC CYST 12/25/2008  . Migraine 05/27/2007  . HYPERLIPIDEMIA 03/10/2007  . CORONARY ARTERY DISEASE 02/27/2007  . ANXIETY DISORDER, GENERALIZED 10/12/2006  . VERTIGO, BENIGN PAROXYSMAL POSITION 10/12/2006  .  Essential hypertension 10/12/2006  . DENTAL CARIES 10/12/2006  . INSOMNIA 10/12/2006    Past Surgical History:  Procedure Laterality Date  . CARDIAC CATHETERIZATION    . COLONOSCOPY W/ BIOPSIES AND POLYPECTOMY    . MULTIPLE TOOTH EXTRACTIONS    . TRANSFORAMINAL LUMBAR INTERBODY FUSION (TLIF) WITH PEDICLE SCREW FIXATION 1 LEVEL Left 09/16/2018   Procedure: LEFT-SIDED LUMBAR 4-5 TRANSFORAMINAL LUMBAR INTERBODY FUSION WITH INSTRUMENTATION AND ALLOGRAFT;  Surgeon: Phylliss Bob, MD;  Location: [facility];   Service: Orthopedics;  Laterality: Left;  . TUBAL LIGATION       OB History   No obstetric history on file.      Home Medications    Prior to Admission medications   Medication Sig Start Date End Date Taking? Authorizing Provider  albuterol (PROVENTIL HFA;VENTOLIN HFA) 108 (90 BASE) MCG/ACT inhaler Inhale 1-2 puffs into the lungs every 6 (six) hours as needed for wheezing or shortness of breath.    [provider]  albuterol (PROVENTIL) (2.5 MG/3ML) 0.083% nebulizer solution Inhale 2.5 mg into the lungs every 4 (four) hours as needed for wheezing or shortness of breath.  06/02/18   [provider]  amLODipine (NORVASC) 10 MG tablet Take 1 tablet (10 mg total) by mouth every morning. 02/11/17   Golden Circle, FNP  amoxicillin-clavulanate (AUGMENTIN) 875-125 MG tablet Take 1 tablet by mouth every 12 (twelve) hours. 09/07/19   Hayden Rasmussen, MD  aspirin 81 MG chewable tablet Chew 81 mg by mouth daily.    [provider]  BUPAP 50-300 MG TABS Take 1 tablet by mouth every 8 (eight) hours as needed for headache. 09/01/18   [provider]  carvedilol (COREG) 6.25 MG tablet TAKE 1 TABLET BY MOUTH TWO  TIMES DAILY WITH A MEAL Patient taking differently: Take 6.25 mg by mouth 2 (two) times daily with a meal.  11/11/16   [place]one, Ples Specter, FNP  cephALEXin (KEFLEX) 500 MG capsule Take 1 capsule (500 mg total) by mouth 2 (two) times daily for 7 days. 10/01/19 10/08/19  Eustaquio Maize, PA-C  Cholecalciferol (VITAMIN D PO) Take 1 tablet by mouth daily.    [provider]  clonazePAM (KLONOPIN) 1 MG tablet take 1 TABLET BY MOUTH TWICE DAILY Patient taking differently: Take 1 mg by mouth 2 (two) times daily.  02/12/17   Golden Circle, FNP  diclofenac sodium (VOLTAREN) 1 % GEL Apply 1 application topically 4 (four) times daily as needed (pain).     [provider]  DULoxetine (CYMBALTA) 30 MG capsule Take 1 capsule (30 mg total) by mouth daily.  Patient not taking: Reported on 10/27/2017 02/11/17   Golden Circle, FNP  fluticasone [facility]) 50 MCG/ACT nasal spray Place 2 sprays into both nostrils daily. Patient not taking: Reported on 09/09/2018 01/14/17   Binnie Rail, MD  gabapentin (NEURONTIN) 300 MG capsule Take 300 mg by mouth 3 (three) times daily as needed (pain).     [provider]  HYDROcodone-acetaminophen (NORCO/VICODIN) 5-325 MG tablet Take 1 tablet by mouth every 6 (six) hours as needed for severe pain. 05/21/19   Law, Bea Graff, PA-C  meloxicam (MOBIC) 15 MG tablet Take 15 mg by mouth daily as needed for pain. 12/27/18   [provider]  methocarbamol (ROBAXIN) 500 MG tablet Take 500 mg by mouth every 8 (eight) hours as needed for muscle spasms.    [provider]  omeprazole (PRILOSEC) 20 MG capsule TAKE 1 CAPSULE BY MOUTH  DAILY BEFORE  BREAKFAST Patient taking differently: Take 20 mg by mouth daily.  11/11/16   Golden Circle, FNP  potassium chloride (K-DUR) 10 MEQ tablet Take 1 tablet (10 mEq total) by mouth daily. 01/20/17   Golden Circle, FNP  rosuvastatin (CRESTOR) 10 MG tablet Take 10 mg by mouth daily.    [provider]  SYMBICORT 80-4.5 MCG/ACT inhaler Use 2 puffs TWICE DAILY (120/4=30) Patient taking differently: Inhale 2 puffs into the lungs 2 (two) times daily.  01/20/17   Golden Circle, FNP  zolpidem (AMBIEN) 5 MG tablet Take 5 mg by mouth at bedtime as needed for sleep. 03/14/19   [provider]    Family History Family History  Problem Relation Age of Onset  . Kidney disease Mother   . Seizures Mother   . Heart failure Father   . Colon cancer Neg Hx   . Breast cancer Neg Hx     Social History Social History   Tobacco Use  . Smoking status: Never Smoker  . Smokeless tobacco: Never Used  Substance Use Topics  . Alcohol use: No  . Drug use: No     Allergies   Other, Azithromycin, and Ciprofloxacin   Review of Systems Review of  Systems  Constitutional: Negative for chills and fever.  HENT: Negative for congestion.   Eyes: Negative for visual disturbance.  Respiratory: Positive for shortness of breath. Negative for cough.   Cardiovascular: Negative for chest pain and leg swelling.  Gastrointestinal: Positive for abdominal pain, diarrhea and nausea. Negative for blood in stool, constipation and vomiting.  Genitourinary: Negative for difficulty urinating, pelvic pain and vaginal discharge.  Musculoskeletal: Positive for arthralgias. Negative for joint swelling and myalgias.  Skin: Negative for wound.  Neurological: Negative for headaches.     Physical Exam Updated Vital Signs BP (!) 138/97 (BP Location: Left Arm)   Pulse 83   Temp 99.1 F (37.3 C) (Oral)   Resp 14   SpO2 100%   Physical Exam Vitals signs and nursing note reviewed.  Constitutional:      Appearance: She is obese. She is not ill-appearing.  HENT:     Head: Normocephalic and atraumatic.  Eyes:     Conjunctiva/sclera: Conjunctivae normal.  Neck:     Musculoskeletal: Neck supple.  Cardiovascular:     Rate and Rhythm: Normal rate and regular rhythm.     Heart sounds: Normal heart sounds.  Pulmonary:     Effort: Pulmonary effort is normal.     Breath sounds: Normal breath sounds. No wheezing, rhonchi or rales.  Chest:     Chest wall: No tenderness.  Abdominal:     General: Abdomen is flat.     Palpations: Abdomen is soft.     Tenderness: There is no abdominal tenderness. There is no right CVA tenderness, left CVA tenderness, guarding or rebound.     Comments: Soft, no focal tenderness on exam despite patient pointing to LLQ/suprapubic/RLQ area for pain, +BS throughout, no r/g/r, neg murphy's, neg mcburney's, no CVA TTP  Musculoskeletal:       Arms:     Comments: No obvious swelling or ecchymosis noted to right shoulder. + TTP along glenohumeral joint. No tenderness to C spine or right elbow. Active and passive ROM limited due to pain.  Strength 4/5 with internal and external rotation compared to left. + Hawkins and Empty can test. Sensation intact throughout. 2+ radial pulse.   Skin:    General: Skin is warm and dry.  Neurological:     Mental Status: She is alert.      ED Treatments / Results  Labs (all labs ordered are listed, but only abnormal results are displayed) Labs Reviewed  COMPREHENSIVE METABOLIC PANEL - Abnormal; Notable for the following components:      Result Value   Glucose, Bld 144 (*)    BUN 7 (*)    Creatinine, Ser 1.05 (*)    GFR calc non Af Amer 56 (*)    All other components within normal limits  URINALYSIS, ROUTINE W REFLEX MICROSCOPIC - Abnormal; Notable for the following components:   APPearance HAZY (*)    Leukocytes,Ua LARGE (*)    Bacteria, UA RARE (*)    All other components within normal limits  URINE CULTURE  LIPASE, BLOOD  CBC WITH DIFFERENTIAL/PLATELET  TROPONIN I (HIGH SENSITIVITY)  TROPONIN I (HIGH SENSITIVITY)    EKG EKG Interpretation  Date/Time:  Saturday October 01 2019 12:29:04 EDT Ventricular Rate:  75 PR Interval:  144 QRS Duration: 86 QT Interval:  336 QTC Calculation: 375 R Axis:   -4 Text Interpretation:  Normal sinus rhythm Nonspecific T wave abnormality Abnormal ECG isolated flipped t wave in lead III and biphasic t in v3. seen on prior Otherwise no significant change Confirmed by Deno Etienne [phone]) on 10/01/2019 1:06:40 PM   Radiology Dg Chest 2 View  Result Date: 10/01/2019 CLINICAL DATA:  Right shoulder and arm pain and weakness for 1 week. EXAM: CHEST - 2 VIEW COMPARISON:  PA and lateral chest 03/14/2018. FINDINGS: Lungs are clear. Heart size is normal. No pneumothorax or pleural effusion. No acute or focal bony abnormality. Degenerative change about the shoulders is worse on the left. IMPRESSION: No acute disease. Electronically Signed   By: Inge Rise M.D.   On: 10/01/2019 13:11   Dg Shoulder Right  Result Date: 10/01/2019 CLINICAL DATA:   Acute right shoulder pain. EXAM: RIGHT SHOULDER - 2+ VIEW COMPARISON:  None. FINDINGS: There is no evidence of fracture or dislocation. There is no evidence of arthropathy or other focal bone abnormality. Soft tissues are unremarkable. IMPRESSION: Negative. Electronically Signed   By: Marijo Conception M.D.   On: 10/01/2019 13:08    Procedures Procedures (including critical care time)  Medications Ordered in ED Medications - No data to display   Initial Impression / Assessment and Plan / ED Course  I have reviewed the triage vital signs and the nursing notes.  Pertinent labs & imaging results that were available during my care of the patient were reviewed by me and considered in my medical decision making (see chart for details).    63 year old female with multiple complaints including right shoulder pain, nausea, diarrhea, lower abdominal pain.  No injury noted to the right shoulder.  On exam patient does have tenderness to palpation along the glenohumeral joint.  Her range of motion is limited actively and passively due to pain.  Will obtain x-ray at this time.  Given she is also complaining of nausea and abdominal pain with diarrhea will obtain baseline screening labs today. Pt has no focal tenderness on exam to suggest acute abdomen today. She was seen in the ED previously on 09/16 and had CT scan done at that time with findings consistent with questionable UTI and diverticulosis; no signs of diverticulitis. Do not feel she needs repeat CT scan at this time. Will check urine to see if it appears infected toady and treat accordingly. If no acute findings pt will need  to follow up with her PCP regarding her intermittent abd pain, nausea, and diarrhea. Patient does have a history of CAD and given she is having some right shoulder pain with nausea/shortness of breath will obtain EKG, chest x-ray, troponin at this time.  Concern for possible ACS as pt does have risk factors although she may just be  experiencing MSK pain from shoulder as well as nausea for unrelated reason.  Patient is denying active chest pain/denying chest pain altogether.  She is attributing her shortness of breath more to anxiety.  She is non-tachycardic on exam.  Doubt PE or dissection today.   No leukocytosis today. Hgb stable. No electrolyte abnormalities. Creatinine mildly elevated at 1.05. baseline around 0.80. Likely from dehydration. Pt states she does drink some water at home but also drinks about 2 12 ounces of soda per day. Have encouraged increased water intake at home and to follow up with her PCP in 1-2 weeks for recheck. Lipase negative today.   Lab Results  Component Value Date   CREATININE 1.05 (H) 10/01/2019   CREATININE 0.83 09/07/2019   CREATININE 0.84 05/21/2019   Initial troponin of 4. Will repeat. EKG without ischemic changes today. CXR clear - impression not autopopulating but read by radiologist Dr. Inge Rise as negative.    FINDINGS: Lungs are clear. Heart size is normal. No pneumothorax or pleural effusion. No acute or focal bony abnormality. Degenerative change about the shoulders is worse on the left.  IMPRESSION: No acute disease.  X ray of right shoulder negative for acute bony process. Will have her follow up outpatient with orthopedics.   Urinalysis positive for leuks and 21-50 WBCs per HPF. Consistent with UTI at this time which was also seen on pt's CT A/P on 09/16. Pt does report she has had some increased urination lately. Will send for culture and treat with Keflex today. Pt advised she will receive a call in 2-3 days if abx needs to be changed. If repeat trop negative pt can be safely discharged home with close PCP follow up. It appears that pt's intermittent diarrhea has been present for "months." Again negative CT scan 3 weeks ago and no focal tenderness on exam today; do not feel she needs imaging of her abdomen at this time. She is advised to follow up with PCP for  further evaluation. Suspect her suprapubic pain and nausea more likely from UTI today. She is advised to take abx as prescribed.   This note was prepared using Dragon voice recognition software and may include unintentional dictation errors due to the inherent limitations of voice recognition software.        Final Clinical Impressions(s) / ED Diagnoses   Final diagnoses:  Lower urinary tract infectious disease  Acute pain of right shoulder    ED Discharge Orders         Ordered    cephALEXin (KEFLEX) 500 MG capsule  2 times daily     10/01/19 [place], Amyrie Illingworth, PA-C 10/01/19 1853    Deno Etienne, DO 10/02/19 0701

## 2019-10-01 NOTE — Discharge Instructions (Addendum)
Your shoulder xray was  negative for any breaks today - please follow up with Dr. Doreatha Martin for further evaluation  You were also found to have a UTI today - please pick up antibiotic at the pharmacy to take. We have sent your urine for culture and you will receive a call in 2-3 days if the antibiotic needs to be changed  Please follow up with your PCP to have your kidney function rechecked in 1-2 weeks as it was slightly elevated today. Increase your water intake until you can follow up with your PCP. Avoid sodas at this time.

## 2019-10-01 NOTE — ED Triage Notes (Signed)
Pt arrives with c/o Rt shoulder pain that started about 1 week ago (per pt) she assumed it was her arthritis, however pt states that she cannot raise her Rt arm any higher than elbows at shoulder level. Pt states she drops her hair brush in that hand due to this new weakness in her Rt arm/shoulder. Unrelated to the pt's arm, she c/o nausea that will not subside for "a while now" (per pt).

## 2019-10-02 LAB — URINE CULTURE: Culture: <10000 — AB

## 2019-10-10 ENCOUNTER — Other Ambulatory Visit: Payer: Self-pay

## 2019-10-10 ENCOUNTER — Emergency Department (HOSPITAL_COMMUNITY)
Admission: EM | Admit: 2019-10-10 | Discharge: 2019-10-10 | Disposition: A | Discharge status: Home / Self Care | Payer: Medicare Other | Attending: Emergency Medicine | Admitting: Emergency Medicine

## 2019-10-10 ENCOUNTER — Encounter (HOSPITAL_COMMUNITY): Payer: Self-pay | Admitting: Emergency Medicine

## 2019-10-10 DIAGNOSIS — N390 Urinary tract infection, site not specified: Secondary | ICD-10-CM | POA: Insufficient documentation

## 2019-10-10 DIAGNOSIS — Z7982 Long term (current) use of aspirin: Secondary | ICD-10-CM | POA: Diagnosis not present

## 2019-10-10 DIAGNOSIS — I10 Essential (primary) hypertension: Secondary | ICD-10-CM | POA: Diagnosis not present

## 2019-10-10 DIAGNOSIS — N939 Abnormal uterine and vaginal bleeding, unspecified: Secondary | ICD-10-CM | POA: Diagnosis not present

## 2019-10-10 DIAGNOSIS — J449 Chronic obstructive pulmonary disease, unspecified: Secondary | ICD-10-CM | POA: Insufficient documentation

## 2019-10-10 DIAGNOSIS — Z79899 Other long term (current) drug therapy: Secondary | ICD-10-CM | POA: Diagnosis not present

## 2019-10-10 DIAGNOSIS — I2510 Atherosclerotic heart disease of native coronary artery without angina pectoris: Secondary | ICD-10-CM | POA: Diagnosis not present

## 2019-10-10 DIAGNOSIS — R102 Pelvic and perineal pain: Secondary | ICD-10-CM | POA: Diagnosis present

## 2019-10-10 LAB — COMPREHENSIVE METABOLIC PANEL
ALT: 17 U/L (ref 0–44)
AST: 22 U/L (ref 15–41)
Albumin: 4.2 g/dL (ref 3.5–5.0)
Alkaline Phosphatase: 78 U/L (ref 38–126)
Anion gap: 9 (ref 5–15)
BUN: 9 mg/dL (ref 8–23)
CO2: 25 mmol/L (ref 22–32)
Calcium: 9.9 mg/dL (ref 8.9–10.3)
Chloride: 105 mmol/L (ref 98–111)
Creatinine, Ser: 0.74 mg/dL (ref 0.44–1.00)
GFR calc Af Amer: >60 mL/min (ref >60)
GFR calc non Af Amer: >60 mL/min (ref >60)
Glucose, Bld: 114 mg/dL — ABNORMAL HIGH (ref 70–99)
Potassium: 3.6 mmol/L (ref 3.5–5.1)
Sodium: 139 mmol/L (ref 135–145)
Total Bilirubin: 0.4 mg/dL (ref 0.3–1.2)
Total Protein: 7.9 g/dL (ref 6.5–8.1)

## 2019-10-10 LAB — CBC
HCT: 43 % (ref 36.0–46.0)
Hemoglobin: 13.3 g/dL (ref 12.0–15.0)
MCH: 27 pg (ref 26.0–34.0)
MCHC: 30.9 g/dL (ref 30.0–36.0)
MCV: 87.4 fL (ref 80.0–100.0)
Platelets: 280 10*3/uL (ref 150–400)
RBC: 4.92 MIL/uL (ref 3.87–5.11)
RDW: 13.2 % (ref 11.5–15.5)
WBC: 6.3 10*3/uL (ref 4.0–10.5)
nRBC: 0 % (ref 0.0–0.2)

## 2019-10-10 LAB — URINALYSIS, ROUTINE W REFLEX MICROSCOPIC
Bilirubin Urine: NEGATIVE
Glucose, UA: NEGATIVE mg/dL
Ketones, ur: NEGATIVE mg/dL
Nitrite: NEGATIVE
Protein, ur: NEGATIVE mg/dL
Specific Gravity, Urine: 1.006 (ref 1.005–1.030)
pH: 7 (ref 5.0–8.0)

## 2019-10-10 LAB — LIPASE, BLOOD: Lipase: 35 U/L (ref 11–51)

## 2019-10-10 LAB — WET PREP, GENITAL
Clue Cells Wet Prep HPF POC: NONE SEEN
Sperm: NONE SEEN
Trich, Wet Prep: NONE SEEN
Yeast Wet Prep HPF POC: NONE SEEN

## 2019-10-10 MED ORDER — CEPHALEXIN 500 MG PO CAPS: 500.0000 mg | ORAL_CAPSULE | Freq: Two times a day (BID) | ORAL | 0 refills | Status: DC

## 2019-10-10 MED ORDER — CEPHALEXIN 500 MG PO CAPS: 500.0000 mg | ORAL_CAPSULE | Freq: Two times a day (BID) | ORAL | 0 refills | Status: AC

## 2019-10-10 NOTE — ED Notes (Signed)
Patient verbalizes understanding of discharge instructions. Opportunity for questioning and answers were provided. Armband removed by staff, pt discharged from ED to home via POV  

## 2019-10-10 NOTE — ED Notes (Signed)
Pelvic exam supplies at bedside

## 2019-10-10 NOTE — ED Triage Notes (Addendum)
Pt reports mid lower abd pain for past 2 weeks. Pt reports vaginal bleeding noted when wiping this morning and burning with urination, nausea, and diarrhea 1 week ago. Was treated for UTI during last visit here.

## 2019-10-10 NOTE — ED Provider Notes (Signed)
[facility] Provider Note   CSN: [id number] Arrival date & time: 10/10/19  1353     History   Chief Complaint Chief Complaint  Patient presents with  . Abdominal Pain    HPI Catherine [name] is a 63 y.o. female with a past medical history of anxiety, CAD, COPD, depression presents today for 2 complaints.  First complaint patient has had gradual constant cramping, intermittent suprapubic abdominal pain with nausea and diarrhea for the past 3 weeks.  She states that she was seen in the ED for this on 9/16 and on 10/10 and provided antibiotics I gave some relief.  First antibiotic-year-old Augmentin, second body given was Keflex.  She states that she comes in for something for nausea and for the return of the pain.  She has not tried anything besides antibiotics and her home oxycodone for chronic back pain with.  There seem to be no aggravating or alleviating factors to the suprapubic pain.  Second complaint is that patient has had nasal vaginal spotting that began this morning.  When she was wiping, she noticed that she had 1/12 April bright red blood from her in [place].  She states that she is not sexually active for the last decade, and that she has not had no vaginal discharge.  There has not occurred before.  There seem to be no aggravating or alleviating factors.  Symptoms have not worsened or recurred since the one time of this morning.    Past Medical History:  Diagnosis Date  . Anxiety   . Arthritis   . CAD (coronary artery disease)   . COPD (chronic obstructive pulmonary disease) ([place])   . Depression   . Headache    migraines  . Hypertension   . Lumbar spinal stenosis   . Obesity   . Spondylolisthesis at L4-L5 level   . Vertigo   . Wears glasses     Patient Active Problem List   Diagnosis Date Noted  . Spinal stenosis 09/16/2018  . Sinusitis, acute maxillary 12/30/2016  . Dizziness 12/30/2016  . Sinusitis 12/30/2016  .  Proteinuria 05/29/2016  . Dysuria 04/03/2016  . Soft tissue lesion of foot 04/03/2016  . Obesity   . Vaginal discharge 10/02/2015  . Other bursal cyst, left ankle and foot 07/19/2015  . Anxiety and depression 05/04/2015  . LEUKOCYTOSIS 10/31/2010  . HYPOKALEMIA 09/18/2010  . GERD 09/18/2010  . TRANSAMINASES, SERUM, ELEVATED 04/08/2010  . DEPRESSION, PROLONGED 12/04/2009  . ANEMIA 11/07/2009  . CHEST PAIN, ATYPICAL 03/28/2009  . SEDIMENTATION RATE, ELEVATED 01/05/2009  . ABSCESS, TOOTH 12/27/2008  . HEPATIC CYST 12/25/2008  . Migraine 05/27/2007  . HYPERLIPIDEMIA 03/10/2007  . CORONARY ARTERY DISEASE 02/27/2007  . ANXIETY DISORDER, GENERALIZED 10/12/2006  . VERTIGO, BENIGN PAROXYSMAL POSITION 10/12/2006  . Essential hypertension 10/12/2006  . DENTAL CARIES 10/12/2006  . INSOMNIA 10/12/2006    Past Surgical History:  Procedure Laterality Date  . CARDIAC CATHETERIZATION    . COLONOSCOPY W/ BIOPSIES AND POLYPECTOMY    . MULTIPLE TOOTH EXTRACTIONS    . TRANSFORAMINAL LUMBAR INTERBODY FUSION (TLIF) WITH PEDICLE SCREW FIXATION 1 LEVEL Left 09/16/2018   Procedure: LEFT-SIDED LUMBAR 4-5 TRANSFORAMINAL LUMBAR INTERBODY FUSION WITH INSTRUMENTATION AND ALLOGRAFT;  Surgeon: Phylliss Bob, MD;  Location: [facility];  Service: Orthopedics;  Laterality: Left;  . TUBAL LIGATION       OB History   No obstetric history on file.      Home Medications    Prior to Admission medications  Medication Sig Start Date End Date Taking? Authorizing Provider  albuterol (PROVENTIL HFA;VENTOLIN HFA) 108 (90 BASE) MCG/ACT inhaler Inhale 1-2 puffs into the lungs every 6 (six) hours as needed for wheezing or shortness of breath.    [provider]  albuterol (PROVENTIL) (2.5 MG/3ML) 0.083% nebulizer solution Inhale 2.5 mg into the lungs every 4 (four) hours as needed for wheezing or shortness of breath.  06/02/18   [provider]  amLODipine (NORVASC) 10 MG tablet Take 1 tablet (10 mg  total) by mouth every morning. 02/11/17   Golden Circle, FNP  amoxicillin-clavulanate (AUGMENTIN) 875-125 MG tablet Take 1 tablet by mouth every 12 (twelve) hours. 09/07/19   Hayden Rasmussen, MD  aspirin 81 MG chewable tablet Chew 81 mg by mouth daily.    [provider]  BUPAP 50-300 MG TABS Take 1 tablet by mouth every 8 (eight) hours as needed for headache. 09/01/18   [provider]  carvedilol (COREG) 6.25 MG tablet TAKE 1 TABLET BY MOUTH TWO  TIMES DAILY WITH A MEAL Patient taking differently: Take 6.25 mg by mouth 2 (two) times daily with a meal.  11/11/16   Calone, Ples Specter, FNP  cephALEXin (KEFLEX) 500 MG capsule Take 1 capsule (500 mg total) by mouth 2 (two) times daily for 7 days. 10/10/19 10/17/19  Julianne Rice, MD  Cholecalciferol (VITAMIN D PO) Take 1 tablet by mouth daily.    [provider]  clonazePAM (KLONOPIN) 1 MG tablet take 1 TABLET BY MOUTH TWICE DAILY Patient taking differently: Take 1 mg by mouth 2 (two) times daily.  02/12/17   Golden Circle, FNP  diclofenac sodium (VOLTAREN) 1 % GEL Apply 1 application topically 4 (four) times daily as needed (pain).     [provider]  DULoxetine (CYMBALTA) 30 MG capsule Take 1 capsule (30 mg total) by mouth daily. Patient not taking: Reported on 10/27/2017 02/11/17   Golden Circle, FNP  fluticasone [facility]) 50 MCG/ACT nasal spray Place 2 sprays into both nostrils daily. Patient not taking: Reported on 09/09/2018 01/14/17   Binnie Rail, MD  gabapentin (NEURONTIN) 300 MG capsule Take 300 mg by mouth 3 (three) times daily as needed (pain).     [provider]  HYDROcodone-acetaminophen (NORCO/VICODIN) 5-325 MG tablet Take 1 tablet by mouth every 6 (six) hours as needed for severe pain. 05/21/19   Law, Bea Graff, PA-C  meloxicam (MOBIC) 15 MG tablet Take 15 mg by mouth daily as needed for pain. 12/27/18   [provider]  methocarbamol (ROBAXIN) 500 MG tablet Take 500 mg  by mouth every 8 (eight) hours as needed for muscle spasms.    [provider]  omeprazole (PRILOSEC) 20 MG capsule TAKE 1 CAPSULE BY MOUTH  DAILY BEFORE BREAKFAST Patient taking differently: Take 20 mg by mouth daily.  11/11/16   Golden Circle, FNP  potassium chloride (K-DUR) 10 MEQ tablet Take 1 tablet (10 mEq total) by mouth daily. 01/20/17   Golden Circle, FNP  rosuvastatin (CRESTOR) 10 MG tablet Take 10 mg by mouth daily.    [provider]  SYMBICORT 80-4.5 MCG/ACT inhaler Use 2 puffs TWICE DAILY (120/4=30) Patient taking differently: Inhale 2 puffs into the lungs 2 (two) times daily.  01/20/17   Golden Circle, FNP  zolpidem (AMBIEN) 5 MG tablet Take 5 mg by mouth at bedtime as needed for sleep. 03/14/19   [provider]    Family History Family History  Problem  Relation Age of Onset  . Kidney disease Mother   . Seizures Mother   . Heart failure Father   . Colon cancer Neg Hx   . Breast cancer Neg Hx     Social History Social History   Tobacco Use  . Smoking status: Never Smoker  . Smokeless tobacco: Never Used  Substance Use Topics  . Alcohol use: No  . Drug use: No     Allergies   Other, Azithromycin, and Ciprofloxacin   Review of Systems Review of Systems  Constitutional: Negative for activity change, appetite change, chills and fever.  Respiratory: Negative for cough and shortness of breath.   Cardiovascular: Negative for chest pain and palpitations.  Gastrointestinal: Positive for abdominal pain and nausea. Negative for vomiting.  Genitourinary: Positive for dysuria and vaginal bleeding. Negative for hematuria, vaginal discharge and vaginal pain.  All other systems reviewed and are negative.    Physical Exam Updated Vital Signs BP (!) 148/88 (BP Location: Left Arm)   Pulse 88   Temp 98.3 F (36.8 C) (Oral)   Resp 18   Wt 78 kg   SpO2 100%   BMI 32.50 kg/m   Physical Exam Vitals signs and nursing note reviewed.   Constitutional:      Appearance: She is well-developed. She is obese.  HENT:     Head: Normocephalic and atraumatic.  Eyes:     Conjunctiva/sclera: Conjunctivae normal.  Neck:     Musculoskeletal: Neck supple.  Cardiovascular:     Rate and Rhythm: Normal rate and regular rhythm.     Heart sounds: No murmur.  Pulmonary:     Effort: Pulmonary effort is normal. No respiratory distress.     Breath sounds: Normal breath sounds.  Abdominal:     General: Bowel sounds are normal.     Palpations: Abdomen is soft.     Tenderness: There is abdominal tenderness in the suprapubic area. There is no guarding or rebound.  Genitourinary:    Cervix: Discharge present. No friability.     Uterus: Normal.      Adnexa: Right adnexa normal and left adnexa normal.     Rectum: Normal.     Comments: Scant vaginal bleeding in the vaginal vault, no obvious lesions, minimal white discharge Skin:    General: Skin is warm and dry.     Capillary Refill: Capillary refill takes less than 2 seconds.  Neurological:     General: No focal deficit present.     Mental Status: She is alert and oriented to person, place, and time.     Gait: Gait normal.      ED Treatments / Results  Labs (all labs ordered are listed, but only abnormal results are displayed) Labs Reviewed  WET PREP, GENITAL - Abnormal; Notable for the following components:      Result Value   WBC, Wet Prep HPF POC MANY (*)    All other components within normal limits  COMPREHENSIVE METABOLIC PANEL - Abnormal; Notable for the following components:   Glucose, Bld 114 (*)    All other components within normal limits  URINALYSIS, ROUTINE W REFLEX MICROSCOPIC - Abnormal; Notable for the following components:   Color, Urine STRAW (*)    Hgb urine dipstick LARGE (*)    Leukocytes,Ua TRACE (*)    Bacteria, UA RARE (*)    All other components within normal limits  URINE CULTURE  LIPASE, BLOOD  CBC    EKG None  Radiology No results found.  Procedures Procedures (including critical care time)  Medications Ordered in ED Medications - No data to display   Initial Impression / Assessment and Plan / ED Course  I have reviewed the triage vital signs and the nursing notes.  Pertinent labs & imaging results that were available during my care of the patient were reviewed by me and considered in my medical decision making (see chart for details).        Catherine [name] is a 63 y.o. female with a past medical history of anxiety, CAD, COPD, depression presents today for 2 complaints.  Patient has had concerns for UTI as well as scant vaginal bleeding.  She arrives to our ED slightly hypertensive, afebrile, normal vital signs.  Differential diagnosis includes abnormal uterine bleeding, she has a negative CT scan within a month ago that did not show any obvious mass-effect or uterine mass.    Triage labs ordered and showed no significant anemia, leukocytosis, electrolyte abnormality.  Normal lipase.  Urinalysis overall do not show UTI, but will treat symptomatically for UTI with Keflex.  Urine culture sent.  Patient has a very benign abdominal exam without focal tenderness to palpation at this time, do not feel a CT scan is necessary considering recent CT scan in September and similar symptom presentation.  Wet prep from pelvic exam white vaginal discharge showed leukocytosis, but no treatable infection.  Bleeding is extremely scant at this time, and bleeding precautions were given that if patient uses more than 1 pad per hour, she needs to return back to the ED.  Outpatient gynecology phone number given.  Care of patient was discussed with the supervising attending.  Final Clinical Impressions(s) / ED Diagnoses   Final diagnoses:  Lower urinary tract infectious disease  Vaginal bleeding    ED Discharge Orders         Ordered    cephALEXin (KEFLEX) 500 MG capsule  2 times daily,   Status:  Discontinued     10/10/19 1724     cephALEXin (KEFLEX) 500 MG capsule  2 times daily     10/10/19 1734           Julianne Rice, MD 10/10/19 [place], [place], DO 10/10/19 2235

## 2019-10-10 NOTE — ED Provider Notes (Signed)
I have personally seen and examined the patient. I have reviewed the documentation on PMH/FH/Soc Hx. I have discussed the plan of care with the resident and patient.  I have reviewed and agree with the resident's documentation. Please see associated encounter note.  Briefly, the patient is a 63 y.o. female here with pain with urination, some vaginal spotting.  Recent UTI.  Patient with history of CAD, hypertension.  Patient with normal vitals.  No fever.  Patient concerned about UTI.  Denies any constipation.  States that after she wipes going to the bathroom today she noticed some blood on the tissue paper.  She thought the bleeding was coming from her vagina.  She denies any rectal bleeding.  She feels as if she has urinary tract infection which she recently had.  Does not have any specific abdominal tenderness on exam.  Will get urinalysis, basic labs.  No concern at this time for an intra-abdominal process such as appendicitis, bowel obstruction.  Will evaluate with a pelvic exam.  Could be some abnormal uterine bleeding and may need close follow-up with gynecology for further evaluation.  She did recently have a CT scan that did not show any abdominal mass or uterine mass.  Will get lab work, urinalysis and reevaluate.  No significant electrolyte abnormality, leukocytosis, anemia.  Lipase normal.  Urinalysis overall unremarkable but will treat for UTI.  Will send urine culture.  Patient did have some scant blood in the vaginal vault.  Swabs have been sent for.  We will have her follow-up closely with gynecology given abnormal uterine bleeding.  However bleeding appears very scant at this time.  Was given return precautions.  Discharged in good condition.  This chart was dictated using voice recognition software.  Despite best efforts to proofread,  errors can occur which can change the documentation meaning.     EKG Interpretation None         Lennice Sites, DO 10/10/19 1657

## 2019-10-11 LAB — URINE CULTURE: Culture: <10000 — AB

## 2019-11-29 ENCOUNTER — Encounter: Payer: Self-pay | Admitting: Gastroenterology

## 2019-12-02 ENCOUNTER — Telehealth: Payer: Self-pay | Admitting: Gastroenterology

## 2019-12-02 NOTE — Telephone Encounter (Signed)
Spoke with patient. She has not been seen in the office that I can find. She calls because she is experiencing abdominal discomfort that is relieved by having a bowel movement. She has had 2 bowel movements today. She has nausea without vomiting. Afebrile.States this is a long term issue. Appointment is scheduled as a new patient for 01/06/2020. Moved her appointment to 1/8/

## 2019-12-30 ENCOUNTER — Ambulatory Visit (INDEPENDENT_AMBULATORY_CARE_PROVIDER_SITE_OTHER): Payer: Medicare Other | Admitting: Gastroenterology

## 2019-12-30 ENCOUNTER — Encounter: Payer: Self-pay | Admitting: Gastroenterology

## 2019-12-30 ENCOUNTER — Other Ambulatory Visit: Payer: Self-pay

## 2019-12-30 VITALS — BP 126/72 | HR 68 | Temp 98.3°F | Ht 60.5 in | Wt 181.1 lb

## 2019-12-30 DIAGNOSIS — R109 Unspecified abdominal pain: Secondary | ICD-10-CM

## 2019-12-30 DIAGNOSIS — Z8719 Personal history of other diseases of the digestive system: Secondary | ICD-10-CM | POA: Diagnosis not present

## 2019-12-30 DIAGNOSIS — K582 Mixed irritable bowel syndrome: Secondary | ICD-10-CM | POA: Diagnosis not present

## 2019-12-30 NOTE — Progress Notes (Signed)
Catherine [name]    [id number]    17-Nov-1956  Primary Care Physician:Patient, No Pcp Per  Referring Physician: Vonna Drafts, [place] [place],  [place] [address]   Chief complaint: Abdominal pain  HPI:  64 year old female here with complaints of periumbilical abdominal pain.  She was in the ER 3 times in the past few months, September and October 2020.  Underwent CT abdomen pelvis negative for acute diverticulitis, had findings of acute cystitis and was treated for urinary tract infection. Complaints of lower abdominal discomfort.  She has irregular bowel habits with constipation, currently not on any laxatives on regular basis.  Denies any melena or rectal bleeding.  No recent weight loss.   Colonoscopy 07/17/2016: 9 mm polyp [inflammatory polyp prolapsed mucosa, negative for adenoma] in transverse colon, left side otherwise unremarkable exam.   CT abd & pelvis with contrast:09/07/19 Abnormal thickened appearance of anterior urinary bladder with adjacent fat stranding suspicious for acute cystitis  Outpatient Encounter Medications as of 12/30/2019  Medication Sig  . albuterol (PROVENTIL HFA;VENTOLIN HFA) 108 (90 BASE) MCG/ACT inhaler Inhale 1-2 puffs into the lungs every 6 (six) hours as needed for wheezing or shortness of breath.  Marland Kitchen albuterol (PROVENTIL) (2.5 MG/3ML) 0.083% nebulizer solution Inhale 2.5 mg into the lungs every 4 (four) hours as needed for wheezing or shortness of breath.   Marland Kitchen amLODipine (NORVASC) 10 MG tablet Take 1 tablet (10 mg total) by mouth every morning.  Marland Kitchen aspirin 81 MG chewable tablet Chew 81 mg by mouth daily.  Marland Kitchen BUPAP 50-300 MG TABS Take 1 tablet by mouth every 8 (eight) hours as needed for headache.  . carvedilol (COREG) 6.25 MG tablet TAKE 1 TABLET BY MOUTH TWO  TIMES DAILY WITH A MEAL (Patient taking differently: Take 6.25 mg by mouth 2 (two) times daily with a meal. )  . Cholecalciferol (VITAMIN D PO) Take 1 tablet by  mouth daily.  . clonazePAM (KLONOPIN) 1 MG tablet take 1 TABLET BY MOUTH TWICE DAILY (Patient taking differently: Take 1 mg by mouth 2 (two) times daily. )  . diclofenac sodium (VOLTAREN) 1 % GEL Apply 1 application topically 4 (four) times daily as needed (pain).   Marland Kitchen dicyclomine (BENTYL) 20 MG tablet Take 1 tablet by mouth 3 (three) times daily.  . DULoxetine (CYMBALTA) 30 MG capsule Take 1 capsule (30 mg total) by mouth daily.  . fluticasone (FLONASE) 50 MCG/ACT nasal spray Place 2 sprays into both nostrils daily.  . meclizine (ANTIVERT) 25 MG tablet Take 1 tablet by mouth 3 (three) times daily.  . meloxicam (MOBIC) 15 MG tablet Take 15 mg by mouth daily as needed for pain.  . naloxone (NARCAN) 4 MG/0.1ML LIQD nasal spray kit 1 spray.  Marland Kitchen oxyCODONE-acetaminophen (PERCOCET/ROXICET) 5-325 MG tablet Take 1 tablet by mouth every 6 (six) hours.  . pantoprazole (PROTONIX) 40 MG tablet Take 1 tablet by mouth daily.  . potassium chloride (K-DUR) 10 MEQ tablet Take 1 tablet (10 mEq total) by mouth daily.  . rosuvastatin (CRESTOR) 10 MG tablet Take 10 mg by mouth daily.  . SYMBICORT 80-4.5 MCG/ACT inhaler Use 2 puffs TWICE DAILY (120/4=30) (Patient taking differently: Inhale 2 puffs into the lungs 2 (two) times daily. )  . zolpidem (AMBIEN) 5 MG tablet Take 5 mg by mouth at bedtime as needed for sleep.  . [DISCONTINUED] amoxicillin-clavulanate (AUGMENTIN) 875-125 MG tablet Take 1 tablet by mouth every 12 (twelve) hours.  . [  DISCONTINUED] gabapentin (NEURONTIN) 300 MG capsule Take 300 mg by mouth 3 (three) times daily as needed (pain).   . [DISCONTINUED] HYDROcodone-acetaminophen (NORCO/VICODIN) 5-325 MG tablet Take 1 tablet by mouth every 6 (six) hours as needed for severe pain.  . [DISCONTINUED] methocarbamol (ROBAXIN) 500 MG tablet Take 500 mg by mouth every 8 (eight) hours as needed for muscle spasms.  . [DISCONTINUED] omeprazole (PRILOSEC) 20 MG capsule TAKE 1 CAPSULE BY MOUTH  DAILY BEFORE BREAKFAST  (Patient taking differently: Take 20 mg by mouth daily. )  . [DISCONTINUED] potassium citrate (UROCIT-K) 10 MEQ (1080 MG) SR tablet Take 1 tablet by mouth daily.   No facility-administered encounter medications on file as of 12/30/2019.    Allergies as of 12/30/2019 - Review Complete 12/30/2019  Allergen Reaction Noted  . Other Nausea Only and Other (See Comments) 04/21/2016  . Azithromycin Nausea And Vomiting 01/14/2017  . Ciprofloxacin Itching 04/13/2008    Past Medical History:  Diagnosis Date  . Anxiety   . Arthritis   . CAD (coronary artery disease)   . COPD (chronic obstructive pulmonary disease) ([place])   . Depression   . Headache    migraines  . Hypertension   . Lumbar spinal stenosis   . Obesity   . Spondylolisthesis at L4-L5 level   . Vertigo   . Wears glasses     Past Surgical History:  Procedure Laterality Date  . CARDIAC CATHETERIZATION    . COLONOSCOPY W/ BIOPSIES AND POLYPECTOMY    . MULTIPLE TOOTH EXTRACTIONS    . TRANSFORAMINAL LUMBAR INTERBODY FUSION (TLIF) WITH PEDICLE SCREW FIXATION 1 LEVEL Left 09/16/2018   Procedure: LEFT-SIDED LUMBAR 4-5 TRANSFORAMINAL LUMBAR INTERBODY FUSION WITH INSTRUMENTATION AND ALLOGRAFT;  Surgeon: Phylliss Bob, MD;  Location: [facility];  Service: Orthopedics;  Laterality: Left;  . TUBAL LIGATION      Family History  Problem Relation Age of Onset  . Kidney disease Mother   . Seizures Mother   . Heart failure Father   . Colon cancer Neg Hx   . Breast cancer Neg Hx     Social History   Socioeconomic History  . Marital status: Divorced    Spouse name: Not on file  . Number of children: 2  . Years of education: 9  . Highest education level: Not on file  Occupational History  . Occupation: Disability    Comment: Heart Disease  Tobacco Use  . Smoking status: Never Smoker  . Smokeless tobacco: Never Used  Substance and Sexual Activity  . Alcohol use: No  . Drug use: No  . Sexual activity: Yes    Birth  control/protection: Surgical  Other Topics Concern  . Not on file  Social History Narrative   Lives with 28 yo son. Mother passed in 2018   Social Determinants of Health   Financial Resource Strain: Low Risk   . Difficulty of Paying Living Expenses: Not hard at all  Food Insecurity: No Food Insecurity  . Worried About Charity fundraiser in the Last Year: Never true  . Ran Out of Food in the Last Year: Never true  Transportation Needs: No Transportation Needs  . Lack of Transportation (Medical): No  . Lack of Transportation (Non-Medical): No  Physical Activity: Insufficiently Active  . Days of Exercise per Week: 2 days  . Minutes of Exercise per Session: 10 min  Stress: Stress Concern Present  . Feeling of Stress : To some extent  Social Connections: Moderately Isolated  . Frequency of Communication  with Friends and Family: Twice a week  . Frequency of Social Gatherings with Friends and Family: Once a week  . Attends Religious Services: Never  . Active Member of Clubs or Organizations: No  . Attends Archivist Meetings: Never  . Marital Status: Divorced  Human resources officer Violence: Not At Risk  . Fear of Current or Ex-Partner: No  . Emotionally Abused: No  . Physically Abused: No  . Sexually Abused: No      Review of systems: Review of Systems  Constitutional: Negative for fever and chills.  HENT: Negative.   Eyes: Negative for blurred vision.  Respiratory: Negative for cough, shortness of breath and wheezing.   Cardiovascular: Negative for chest pain and palpitations.  Gastrointestinal: as per HPI Genitourinary: Positive for dysuria, urgency, frequency and hematuria.  Musculoskeletal: Negative for myalgias, back pain and joint pain.  Skin: Negative for itching and rash.  Neurological: Negative for dizziness, tremors, focal weakness, seizures and loss of consciousness.  Endo/Heme/Allergies: Negative  psychiatric/Behavioral: Negative for depression, suicidal  ideas and hallucinations.  All other systems reviewed and are negative.   Physical Exam: Vitals:   12/30/19 0831  Temp: 98.3 F (36.8 C)   Body mass index is 34.79 kg/m. Gen:      No acute distress HEENT:  EOMI, sclera anicteric Neck:     No masses; no thyromegaly Lungs:    Clear to auscultation bilaterally; normal respiratory effort CV:         Regular rate and rhythm; no murmurs Abd:      + bowel sounds; soft, non-tender; no palpable masses, no distension Ext:    No edema; adequate peripheral perfusion Skin:      Warm and dry; no rash Neuro: alert and oriented x 3 Psych: normal mood and affect  Data Reviewed:  Reviewed labs, radiology imaging, old records and pertinent past GI work up   Assessment and Plan/Recommendations:  64 year old female with hypertension, CAD, history of left-sided diverticulosis with complaints of lower abdominal pain, recent CT abdomen pelvis negative for acute diverticulitis. Irritable bowel syndrome predominant constipation Start MiraLAX 1 capful daily Fiberchoice 1 tablet twice daily Use IBgard 1 capsule up to 3 times daily as needed for abdominal discomfort and cramping  Prolapsed mucosa inflammatory polyp on colonoscopy in 2017, due for recall in 2027   Return in 3 months or sooner if needed   This visit required 30 minutes of patient care (this includes precharting, chart review, review of results, face-to-face time used for counseling as well as treatment plan and follow-up. The patient was provided an opportunity to ask questions and all were answered. The patient agreed with the plan and demonstrated an understanding of the instructions.  Damaris Hippo , MD    CC: Vonna Drafts, FNP

## 2019-12-30 NOTE — Patient Instructions (Signed)
Take Miralax 1 capful daily with juice or water  Take Fiberchoice 1 tablet twice daily  Use IB Gard 1 capsule three times a day with meals as needed  If you are age 66 or older, your body mass index should be between 23-30. Your Body mass index is 34.79 kg/m. If this is out of the aforementioned range listed, please consider follow up with your Primary Care Provider.  If you are age 70 or younger, your body mass index should be between 19-25. Your Body mass index is 34.79 kg/m. If this is out of the aformentioned range listed, please consider follow up with your Primary Care Provider.    I appreciate the  opportunity to care for you  Thank You   Harl Bowie , MD

## 2020-01-02 ENCOUNTER — Telehealth: Payer: Self-pay | Admitting: Gastroenterology

## 2020-01-02 ENCOUNTER — Encounter: Payer: Self-pay | Admitting: Gastroenterology

## 2020-01-02 MED ORDER — IBGARD 90 MG PO CPCR: 2.0000 | ORAL_CAPSULE | Freq: Every day | ORAL | 3 refills | Status: DC

## 2020-01-02 MED ORDER — IBGARD 90 MG PO CPCR: 2.0000 | ORAL_CAPSULE | Freq: Every day | ORAL | 3 refills | Status: AC

## 2020-01-02 NOTE — Telephone Encounter (Signed)
Pt is requesting prescription for IB guard sent to [facility].,

## 2020-01-02 NOTE — Telephone Encounter (Signed)
Resent again  Today (This is a OTC medication)

## 2020-01-02 NOTE — Telephone Encounter (Signed)
IBGard sent to pharmacy  This is an OTC medication but sent in to her pharmacy as requested

## 2020-01-06 ENCOUNTER — Ambulatory Visit: Payer: Medicare Other | Admitting: Gastroenterology

## 2020-01-13 ENCOUNTER — Telehealth: Payer: Self-pay | Admitting: Gastroenterology

## 2020-01-13 MED ORDER — DICYCLOMINE HCL 10 MG PO CAPS: 10.0000 mg | ORAL_CAPSULE | Freq: Three times a day (TID) | ORAL | 2 refills | Status: DC | PRN

## 2020-01-13 NOTE — Telephone Encounter (Signed)
The pt has been given the information to begin miralax 1 cap daily.  She was also advised her bentyl was refilled for 10 mg every 8 hours as needed.  She will call with any further concerns

## 2020-01-13 NOTE — Telephone Encounter (Signed)
Patient had stomach cramps after a meal today. She cramped for about an hour. She had a hard bowel movement. She is taking IBgard and she feels this is usually effective. Admits that she has been having hard bowel movements, then instances of diarrhea. She has Miralax in the home. Does not take this on a daily basis. She is agreeable to taking a dose of Miralax today.  She does not have Bentyl on hand. Okay to refill?  She would like to be called back if it is called in . The pharmacy does not notify her when a prescription is ready for pick up.

## 2020-01-13 NOTE — Telephone Encounter (Signed)
Ok to refill Bentyl 10mg  Q8h prn. Agree with starting Miralax 1 capful daily. Thanks

## 2020-01-24 ENCOUNTER — Telehealth: Payer: Self-pay | Admitting: Gastroenterology

## 2020-01-24 NOTE — Telephone Encounter (Signed)
Please route to [facility] she is Dr Jillyn Hidden nurse

## 2020-01-24 NOTE — Telephone Encounter (Signed)
Patient is calling with questions and concerns with taking her medications and states she read about BMI levels and does not want to loose weight

## 2020-01-24 NOTE — Telephone Encounter (Signed)
Patient has read the BMI mentioned in her AVS. She is concerned. Wants to know what it means. States she is happy with her weight.  Explained the BMI is a quick and easy way to measure the degree of obesity of the general population to assist the government in allocating resources. It is required to be included on the after visit summary to improve awareness of obesity.

## 2020-01-31 ENCOUNTER — Other Ambulatory Visit: Payer: Self-pay

## 2020-01-31 ENCOUNTER — Encounter: Payer: Self-pay | Admitting: Gastroenterology

## 2020-01-31 ENCOUNTER — Ambulatory Visit (INDEPENDENT_AMBULATORY_CARE_PROVIDER_SITE_OTHER): Payer: Medicare Other | Admitting: Gastroenterology

## 2020-01-31 VITALS — BP 110/70 | HR 62 | Temp 97.6°F | Ht 60.05 in | Wt 175.0 lb

## 2020-01-31 DIAGNOSIS — R102 Pelvic and perineal pain: Secondary | ICD-10-CM | POA: Diagnosis not present

## 2020-01-31 DIAGNOSIS — K581 Irritable bowel syndrome with constipation: Secondary | ICD-10-CM

## 2020-01-31 DIAGNOSIS — N3090 Cystitis, unspecified without hematuria: Secondary | ICD-10-CM

## 2020-01-31 DIAGNOSIS — R634 Abnormal weight loss: Secondary | ICD-10-CM | POA: Diagnosis not present

## 2020-01-31 DIAGNOSIS — R109 Unspecified abdominal pain: Secondary | ICD-10-CM | POA: Diagnosis not present

## 2020-01-31 DIAGNOSIS — N950 Postmenopausal bleeding: Secondary | ICD-10-CM

## 2020-01-31 NOTE — Patient Instructions (Addendum)
We have given you fiber choice samples  Use IBGard as needed  Take Miralax 1 capful daily   Follow up in 6 months  If you are age 66 or older, your body mass index should be between 23-30. Your Body mass index is 34.12 kg/m. If this is out of the aforementioned range listed, please consider follow up with your Primary Care Provider.  If you are age 36 or younger, your body mass index should be between 19-25. Your Body mass index is 34.12 kg/m. If this is out of the aformentioned range listed, please consider follow up with your Primary Care Provider.    I appreciate the  opportunity to care for you  Thank You   Harl Bowie , MD

## 2020-01-31 NOTE — Progress Notes (Signed)
Catherine [name]    [id number]    07/22/1956  Primary Care Physician:Ohenhen, Fraser Din, NP  Referring Physician: No referring provider defined for this encounter.   Chief complaint:  IBS  HPI:  64 year old female here for follow-up visit for chronic irritable bowel syndrome predominant constipation  Her bowel habits are more regular with daily MiraLAX and fiber choice.  On average she is having 2-3 bowel movements per day.  Denies any abdominal pain but she continues to have discomfort in the suprapubic region, has been having it persistently since she had gynecological procedure with biopsies.  Report not available to review during this visit. Denies any decreased appetite, nausea, vomiting, melena or blood per rectum. She is having downtrend in weight, 175 today from 181 pounds at last office visit in January 2021  Colonoscopy 07/17/2016: 9 mm polyp [inflammatory polyp prolapsed mucosa, negative for adenoma] in transverse colon, left side otherwise unremarkable exam.   CT abd & pelvis with contrast:09/07/19 Abnormal thickened appearance of anterior urinary bladder with adjacent fat stranding suspicious for acute cystitis   Outpatient Encounter Medications as of 01/31/2020  Medication Sig  . albuterol (PROVENTIL HFA;VENTOLIN HFA) 108 (90 BASE) MCG/ACT inhaler Inhale 1-2 puffs into the lungs every 6 (six) hours as needed for wheezing or shortness of breath.  Marland Kitchen albuterol (PROVENTIL) (2.5 MG/3ML) 0.083% nebulizer solution Inhale 2.5 mg into the lungs every 4 (four) hours as needed for wheezing or shortness of breath.   Marland Kitchen amLODipine (NORVASC) 10 MG tablet Take 1 tablet (10 mg total) by mouth every morning.  Marland Kitchen aspirin 81 MG chewable tablet Chew 81 mg by mouth daily.  Marland Kitchen BUPAP 50-300 MG TABS Take 1 tablet by mouth every 8 (eight) hours as needed for headache.  . carvedilol (COREG) 6.25 MG tablet TAKE 1 TABLET BY MOUTH TWO  TIMES DAILY WITH A MEAL (Patient taking differently: Take  6.25 mg by mouth 2 (two) times daily with a meal. )  . Cholecalciferol (VITAMIN D PO) Take 1 tablet by mouth daily.  . clonazePAM (KLONOPIN) 1 MG tablet take 1 TABLET BY MOUTH TWICE DAILY (Patient taking differently: Take 1 mg by mouth 2 (two) times daily. )  . dicyclomine (BENTYL) 10 MG capsule Take 1 capsule (10 mg total) by mouth every 8 (eight) hours as needed for spasms.  . DULoxetine (CYMBALTA) 30 MG capsule Take 1 capsule (30 mg total) by mouth daily.  . meclizine (ANTIVERT) 25 MG tablet Take 1 tablet by mouth 3 (three) times daily.  . naloxone (NARCAN) 4 MG/0.1ML LIQD nasal spray kit 1 spray.  Marland Kitchen oxyCODONE-acetaminophen (PERCOCET/ROXICET) 5-325 MG tablet Take 1 tablet by mouth every 6 (six) hours.  . pantoprazole (PROTONIX) 40 MG tablet Take 1 tablet by mouth daily.  Marland Kitchen Peppermint Oil (IBGARD) 90 MG CPCR Take 2 capsules by mouth daily. Take 30-90 minutes before meals with water  . potassium chloride (K-DUR) 10 MEQ tablet Take 1 tablet (10 mEq total) by mouth daily.  . rosuvastatin (CRESTOR) 10 MG tablet Take 10 mg by mouth daily.  . SYMBICORT 80-4.5 MCG/ACT inhaler Use 2 puffs TWICE DAILY (120/4=30) (Patient taking differently: Inhale 2 puffs into the lungs 2 (two) times daily. )  . [DISCONTINUED] diclofenac sodium (VOLTAREN) 1 % GEL Apply 1 application topically 4 (four) times daily as needed (pain).   . [DISCONTINUED] fluticasone (FLONASE) 50 MCG/ACT nasal spray Place 2 sprays into both nostrils daily.  . [DISCONTINUED] meloxicam (MOBIC) 15 MG tablet  Take 15 mg by mouth daily as needed for pain.  . [DISCONTINUED] zolpidem (AMBIEN) 5 MG tablet Take 5 mg by mouth at bedtime as needed for sleep.   No facility-administered encounter medications on file as of 01/31/2020.    Allergies as of 01/31/2020 - Review Complete 01/31/2020  Allergen Reaction Noted  . Other Nausea Only and Other (See Comments) 04/21/2016  . Azithromycin Nausea And Vomiting 01/14/2017  . Ciprofloxacin Itching 04/13/2008     Past Medical History:  Diagnosis Date  . Anxiety   . Arthritis   . CAD (coronary artery disease)   . COPD (chronic obstructive pulmonary disease) ([place])   . Depression   . Headache    migraines  . Hypertension   . Lumbar spinal stenosis   . Obesity   . Spondylolisthesis at L4-L5 level   . Vertigo   . Wears glasses     Past Surgical History:  Procedure Laterality Date  . CARDIAC CATHETERIZATION    . COLONOSCOPY W/ BIOPSIES AND POLYPECTOMY    . MULTIPLE TOOTH EXTRACTIONS    . TRANSFORAMINAL LUMBAR INTERBODY FUSION (TLIF) WITH PEDICLE SCREW FIXATION 1 LEVEL Left 09/16/2018   Procedure: LEFT-SIDED LUMBAR 4-5 TRANSFORAMINAL LUMBAR INTERBODY FUSION WITH INSTRUMENTATION AND ALLOGRAFT;  Surgeon: Phylliss Bob, MD;  Location: [facility] [place];  Service: Orthopedics;  Laterality: Left;  . TUBAL LIGATION      Family History  Problem Relation Age of Onset  . Kidney disease Mother   . Seizures Mother   . Heart failure Father   . Colon cancer Neg Hx   . Breast cancer Neg Hx     Social History   Socioeconomic History  . Marital status: Divorced    Spouse name: Not on file  . Number of children: 2  . Years of education: 9  . Highest education level: Not on file  Occupational History  . Occupation: Disability    Comment: Heart Disease  Tobacco Use  . Smoking status: Never Smoker  . Smokeless tobacco: Never Used  Substance and Sexual Activity  . Alcohol use: No  . Drug use: No  . Sexual activity: Yes    Birth control/protection: Surgical  Other Topics Concern  . Not on file  Social History Narrative   Lives with 45 yo son. Mother passed in 2018   Social Determinants of Health   Financial Resource Strain: Low Risk   . Difficulty of Paying Living Expenses: Not hard at all  Food Insecurity: No Food Insecurity  . Worried About Charity fundraiser in the Last Year: Never true  . Ran Out of Food in the Last Year: Never true  Transportation Needs: No Transportation Needs  . Lack  of Transportation (Medical): No  . Lack of Transportation (Non-Medical): No  Physical Activity: Insufficiently Active  . Days of Exercise per Week: 2 days  . Minutes of Exercise per Session: 10 min  Stress: Stress Concern Present  . Feeling of Stress : To some extent  Social Connections: Moderately Isolated  . Frequency of Communication with Friends and Family: Twice a week  . Frequency of Social Gatherings with Friends and Family: Once a week  . Attends Religious Services: Never  . Active Member of Clubs or Organizations: No  . Attends Archivist Meetings: Never  . Marital Status: Divorced  Human resources officer Violence: Not At Risk  . Fear of Current or Ex-Partner: No  . Emotionally Abused: No  . Physically Abused: No  . Sexually Abused: No  Review of systems:  All other review of systems negative except as mentioned in the HPI.   Physical Exam: Vitals:   01/31/20 0905  BP: 110/70  Pulse: 62  Temp: 97.6 F (36.4 C)   Body mass index is 34.12 kg/m. Gen:      No acute distress HEENT:  EOMI, sclera anicteric Neck:     No masses; no thyromegaly Lungs:    Clear to auscultation bilaterally; normal respiratory effort CV:         Regular rate and rhythm; no murmurs Abd:      + bowel sounds; soft, non-tender; no palpable masses, no distension Ext:    No edema; adequate peripheral perfusion Skin:      Warm and dry; no rash Neuro: alert and oriented x 3 Psych: normal mood and affect  Data Reviewed:  Reviewed labs, radiology imaging, old records and pertinent past GI work up   Assessment and Plan/Recommendations:  64 year old female with history of left-sided diverticulosis, irritable bowel syndrome predominant constipation  IBS predominant constipation: Continue MiraLAX 1 capful daily Continue fiber choice 1 tablet twice daily with meals Increase dietary fiber and water intake  IBS with intermittent abdominal cramping: Use IBgard 1 capsule up to 3 times  daily as needed  She is up-to-date with colorectal cancer screening, due for recall colonoscopy in 2027  Suprapubic discomfort, has history of postmenopausal bleeding, s/p pelvic exam and? Biopsies.  CT showed nonspecific bladder wall thickening?  Cystitis.  Results and reports are not available in epic for review, advised patient to follow-up with PMD and GYN  Weight loss: Unclear etiology, continue to monitor Small frequent meals with high-protein diet  Return in 6 months or sooner if needed  This visit required 30 minutes of patient care (this includes precharting, chart review, review of results, face-to-face time used for counseling as well as treatment plan and follow-up. The patient was provided an opportunity to ask questions and all were answered. The patient agreed with the plan and demonstrated an understanding of the instructions.  Damaris Hippo , MD    CC: No ref. provider found

## 2020-02-13 ENCOUNTER — Other Ambulatory Visit: Payer: Self-pay

## 2020-02-13 ENCOUNTER — Encounter (HOSPITAL_COMMUNITY): Payer: Self-pay | Admitting: Emergency Medicine

## 2020-02-13 ENCOUNTER — Emergency Department (HOSPITAL_COMMUNITY)
Admission: EM | Admit: 2020-02-13 | Discharge: 2020-02-14 | Disposition: A | Discharge status: Home / Self Care | Payer: Medicare Other | Attending: Emergency Medicine | Admitting: Emergency Medicine

## 2020-02-13 DIAGNOSIS — J449 Chronic obstructive pulmonary disease, unspecified: Secondary | ICD-10-CM | POA: Diagnosis not present

## 2020-02-13 DIAGNOSIS — R102 Pelvic and perineal pain: Secondary | ICD-10-CM

## 2020-02-13 DIAGNOSIS — I10 Essential (primary) hypertension: Secondary | ICD-10-CM | POA: Diagnosis not present

## 2020-02-13 DIAGNOSIS — R8290 Unspecified abnormal findings in urine: Secondary | ICD-10-CM | POA: Diagnosis not present

## 2020-02-13 DIAGNOSIS — Z79899 Other long term (current) drug therapy: Secondary | ICD-10-CM | POA: Insufficient documentation

## 2020-02-13 DIAGNOSIS — I2510 Atherosclerotic heart disease of native coronary artery without angina pectoris: Secondary | ICD-10-CM | POA: Diagnosis not present

## 2020-02-13 DIAGNOSIS — Z7982 Long term (current) use of aspirin: Secondary | ICD-10-CM | POA: Diagnosis not present

## 2020-02-13 LAB — COMPREHENSIVE METABOLIC PANEL
ALT: 14 U/L (ref 0–44)
AST: 21 U/L (ref 15–41)
Albumin: 4.1 g/dL (ref 3.5–5.0)
Alkaline Phosphatase: 113 U/L (ref 38–126)
Anion gap: 11 (ref 5–15)
BUN: 7 mg/dL — ABNORMAL LOW (ref 8–23)
CO2: 24 mmol/L (ref 22–32)
Calcium: 9.8 mg/dL (ref 8.9–10.3)
Chloride: 105 mmol/L (ref 98–111)
Creatinine, Ser: 0.82 mg/dL (ref 0.44–1.00)
GFR calc Af Amer: >60 mL/min (ref >60)
GFR calc non Af Amer: >60 mL/min (ref >60)
Glucose, Bld: 115 mg/dL — ABNORMAL HIGH (ref 70–99)
Potassium: 3.5 mmol/L (ref 3.5–5.1)
Sodium: 140 mmol/L (ref 135–145)
Total Bilirubin: 0.3 mg/dL (ref 0.3–1.2)
Total Protein: 7.8 g/dL (ref 6.5–8.1)

## 2020-02-13 LAB — URINALYSIS, ROUTINE W REFLEX MICROSCOPIC
Bacteria, UA: NONE SEEN
Bilirubin Urine: NEGATIVE
Glucose, UA: NEGATIVE mg/dL
Hgb urine dipstick: NEGATIVE
Ketones, ur: NEGATIVE mg/dL
Nitrite: NEGATIVE
Protein, ur: NEGATIVE mg/dL
Specific Gravity, Urine: 1.008 (ref 1.005–1.030)
pH: 7 (ref 5.0–8.0)

## 2020-02-13 LAB — CBC
HCT: 43.2 % (ref 36.0–46.0)
Hemoglobin: 14 g/dL (ref 12.0–15.0)
MCH: 27.4 pg (ref 26.0–34.0)
MCHC: 32.4 g/dL (ref 30.0–36.0)
MCV: 84.5 fL (ref 80.0–100.0)
Platelets: 255 10*3/uL (ref 150–400)
RBC: 5.11 MIL/uL (ref 3.87–5.11)
RDW: 13.4 % (ref 11.5–15.5)
WBC: 3 10*3/uL — ABNORMAL LOW (ref 4.0–10.5)
nRBC: 0 % (ref 0.0–0.2)

## 2020-02-13 LAB — LIPASE, BLOOD: Lipase: 31 U/L (ref 11–51)

## 2020-02-13 MED ORDER — SODIUM CHLORIDE 0.9% FLUSH: 3.0000 mL | Freq: Once | INTRAVENOUS | Status: DC

## 2020-02-13 NOTE — ED Triage Notes (Signed)
Pt c/o LLQ pain that started tonight when she laid down to sleep. Denies nausea/vomiting, hx IBS.

## 2020-02-14 DIAGNOSIS — R102 Pelvic and perineal pain: Secondary | ICD-10-CM | POA: Diagnosis not present

## 2020-02-14 LAB — URINE CULTURE

## 2020-02-14 MED ORDER — CEPHALEXIN 500 MG PO CAPS: 500.0000 mg | ORAL_CAPSULE | Freq: Two times a day (BID) | ORAL | 0 refills | Status: DC

## 2020-02-14 MED ORDER — CEPHALEXIN 250 MG PO CAPS
500.0000 mg | ORAL_CAPSULE | Freq: Once | ORAL | Status: AC
  Administered 2020-02-14: 500 mg via ORAL
  Filled 2020-02-14: qty 2

## 2020-02-14 NOTE — ED Notes (Signed)
Discharge instructions discussed with pt. Pt verbalized understanding with no questions at this time. Pt to go home with family.  °

## 2020-02-14 NOTE — ED Notes (Signed)
Urine culture sent to lab.

## 2020-02-14 NOTE — Discharge Instructions (Addendum)
Your emergency department work-up is complete.  Your laboratory work-up is very reassuring.  Your urinalysis did show quite a few white blood cells, which could possibly indicate infection in your bladder or urinary tract.  The location of your pain in your lower abdomen could also indicate bladder infection.  We will start you on Keflex, an antibiotic, which will treat this.  Given your history of IBS, it is also possible that you are starting with an IBS flare.  It is also possible that you could have developing diverticulitis.  If you develop a fever, have bloody diarrhea, or worsening abdominal pain you should return to the emergency department.

## 2020-02-14 NOTE — ED Provider Notes (Signed)
[facility] EMERGENCY DEPARTMENT Provider Note   CSN: [id number] Arrival date & time: 02/13/20  2053     History Chief Complaint  Patient presents with  . Abdominal Pain    Catherine [name] is a 64 y.o. female.  Patient presents to the emergency department with a chief complaint of lower abdominal pain.  She states that it started tonight when she laid down to go to sleep.  She states that it has improved since coming to the emergency department, but she can still feel a small amount of pain.  She denies any nausea, vomiting, or diarrhea.  She does have a history of IBS, but states that this feels different.  She denies any dysuria or hematuria.  Denies taking anything for symptoms.  Denies any other associated symptoms.  She is asking when she can leave.  The history is provided by the patient. No language interpreter was used.       Past Medical History:  Diagnosis Date  . Anxiety   . Arthritis   . CAD (coronary artery disease)   . COPD (chronic obstructive pulmonary disease) ([place])   . Depression   . Headache    migraines  . Hypertension   . Lumbar spinal stenosis   . Obesity   . Spondylolisthesis at L4-L5 level   . Vertigo   . Wears glasses     Patient Active Problem List   Diagnosis Date Noted  . Spinal stenosis 09/16/2018  . Sinusitis, acute maxillary 12/30/2016  . Dizziness 12/30/2016  . Sinusitis 12/30/2016  . Proteinuria 05/29/2016  . Dysuria 04/03/2016  . Soft tissue lesion of foot 04/03/2016  . Obesity   . Vaginal discharge 10/02/2015  . Other bursal cyst, left ankle and foot 07/19/2015  . Anxiety and depression 05/04/2015  . LEUKOCYTOSIS 10/31/2010  . HYPOKALEMIA 09/18/2010  . GERD 09/18/2010  . TRANSAMINASES, SERUM, ELEVATED 04/08/2010  . DEPRESSION, PROLONGED 12/04/2009  . ANEMIA 11/07/2009  . CHEST PAIN, ATYPICAL 03/28/2009  . SEDIMENTATION RATE, ELEVATED 01/05/2009  . ABSCESS, TOOTH 12/27/2008  . HEPATIC CYST 12/25/2008  .  Migraine 05/27/2007  . HYPERLIPIDEMIA 03/10/2007  . CORONARY ARTERY DISEASE 02/27/2007  . ANXIETY DISORDER, GENERALIZED 10/12/2006  . VERTIGO, BENIGN PAROXYSMAL POSITION 10/12/2006  . Essential hypertension 10/12/2006  . DENTAL CARIES 10/12/2006  . INSOMNIA 10/12/2006    Past Surgical History:  Procedure Laterality Date  . CARDIAC CATHETERIZATION    . COLONOSCOPY W/ BIOPSIES AND POLYPECTOMY    . MULTIPLE TOOTH EXTRACTIONS    . TRANSFORAMINAL LUMBAR INTERBODY FUSION (TLIF) WITH PEDICLE SCREW FIXATION 1 LEVEL Left 09/16/2018   Procedure: LEFT-SIDED LUMBAR 4-5 TRANSFORAMINAL LUMBAR INTERBODY FUSION WITH INSTRUMENTATION AND ALLOGRAFT;  Surgeon: Phylliss Bob, MD;  Location: [place];  Service: Orthopedics;  Laterality: Left;  . TUBAL LIGATION       OB History   No obstetric history on file.     Family History  Problem Relation Age of Onset  . Kidney disease Mother   . Seizures Mother   . Heart failure Father   . Colon cancer Neg Hx   . Breast cancer Neg Hx     Social History   Tobacco Use  . Smoking status: Never Smoker  . Smokeless tobacco: Never Used  Substance Use Topics  . Alcohol use: No  . Drug use: No    Home Medications Prior to Admission medications   Medication Sig Start Date End Date Taking? Authorizing Provider  albuterol (PROVENTIL HFA;VENTOLIN HFA) 108 (90 BASE)  MCG/ACT inhaler Inhale 1-2 puffs into the lungs every 6 (six) hours as needed for wheezing or shortness of breath.    [provider]  albuterol (PROVENTIL) (2.5 MG/3ML) 0.083% nebulizer solution Inhale 2.5 mg into the lungs every 4 (four) hours as needed for wheezing or shortness of breath.  06/02/18   [provider]  amLODipine (NORVASC) 10 MG tablet Take 1 tablet (10 mg total) by mouth every morning. 02/11/17   Golden Circle, FNP  aspirin 81 MG chewable tablet Chew 81 mg by mouth daily.    [provider]  BUPAP 50-300 MG TABS Take 1 tablet by mouth every 8 (eight)  hours as needed for headache. 09/01/18   [provider]  carvedilol (COREG) 6.25 MG tablet TAKE 1 TABLET BY MOUTH TWO  TIMES DAILY WITH A MEAL Patient taking differently: Take 6.25 mg by mouth 2 (two) times daily with a meal.  11/11/16   Calone, Ples Specter, FNP  cephALEXin (KEFLEX) 500 MG capsule Take 1 capsule (500 mg total) by mouth 2 (two) times daily. 02/14/20   Montine Circle, PA-C  Cholecalciferol (VITAMIN D PO) Take 1 tablet by mouth daily.    [provider]  clonazePAM (KLONOPIN) 1 MG tablet take 1 TABLET BY MOUTH TWICE DAILY Patient taking differently: Take 1 mg by mouth 2 (two) times daily.  02/12/17   Golden Circle, FNP  dicyclomine (BENTYL) 10 MG capsule Take 1 capsule (10 mg total) by mouth every 8 (eight) hours as needed for spasms. 01/13/20 02/12/20  Mauri Pole, MD  DULoxetine (CYMBALTA) 30 MG capsule Take 1 capsule (30 mg total) by mouth daily. 02/11/17   Golden Circle, FNP  meclizine (ANTIVERT) 25 MG tablet Take 1 tablet by mouth 3 (three) times daily.    [provider]  naloxone [facility]) 4 MG/0.1ML LIQD nasal spray kit 1 spray.    [provider]  oxyCODONE-acetaminophen (PERCOCET/ROXICET) 5-325 MG tablet Take 1 tablet by mouth every 6 (six) hours.    [provider]  pantoprazole (PROTONIX) 40 MG tablet Take 1 tablet by mouth daily.    [provider]  Peppermint Oil (IBGARD) 90 MG CPCR Take 2 capsules by mouth daily. Take 30-90 minutes before meals with water 01/02/20   Mauri Pole, MD  potassium chloride (K-DUR) 10 MEQ tablet Take 1 tablet (10 mEq total) by mouth daily. 01/20/17   Golden Circle, FNP  rosuvastatin (CRESTOR) 10 MG tablet Take 10 mg by mouth daily.    [provider]  SYMBICORT 80-4.5 MCG/ACT inhaler Use 2 puffs TWICE DAILY (120/4=30) Patient taking differently: Inhale 2 puffs into the lungs 2 (two) times daily.  01/20/17   Golden Circle, FNP    Allergies    Other,  Azithromycin, and Ciprofloxacin  Review of Systems   Review of Systems  All other systems reviewed and are negative.   Physical Exam Updated Vital Signs BP 119/80   Pulse 60   Temp 97.6 F (36.4 C) (Oral)   Resp 15   SpO2 99%   Physical Exam Vitals and nursing note reviewed.  Constitutional:      General: She is not in acute distress.    Appearance: She is well-developed.  HENT:     Head: Normocephalic and atraumatic.  Eyes:     Conjunctiva/sclera: Conjunctivae normal.  Cardiovascular:     Rate and Rhythm: Normal rate and regular rhythm.     Heart sounds: No murmur.  Pulmonary:  Effort: Pulmonary effort is normal. No respiratory distress.     Breath sounds: Normal breath sounds.  Abdominal:     Palpations: Abdomen is soft.     Tenderness: There is no abdominal tenderness.     Comments: No focal abdominal tenderness  Musculoskeletal:     Cervical back: Neck supple.  Skin:    General: Skin is warm and dry.  Neurological:     Mental Status: She is alert.  Psychiatric:        Mood and Affect: Mood normal.        Behavior: Behavior normal.     ED Results / Procedures / Treatments   Labs (all labs ordered are listed, but only abnormal results are displayed) Labs Reviewed  COMPREHENSIVE METABOLIC PANEL - Abnormal; Notable for the following components:      Result Value   Glucose, Bld 115 (*)    BUN 7 (*)    All other components within normal limits  CBC - Abnormal; Notable for the following components:   WBC 3.0 (*)    All other components within normal limits  URINALYSIS, ROUTINE W REFLEX MICROSCOPIC - Abnormal; Notable for the following components:   Color, Urine STRAW (*)    Leukocytes,Ua MODERATE (*)    All other components within normal limits  URINE CULTURE  LIPASE, BLOOD    EKG EKG Interpretation  Date/Time:  Monday February 13 2020 21:29:20 EST Ventricular Rate:  71 PR Interval:  156 QRS Duration: 96 QT Interval:  358 QTC Calculation: 389  R Axis:   -19 Text Interpretation: Normal sinus rhythm Moderate voltage criteria for LVH, may be normal variant ( R in aVL , Cornell product ) Nonspecific T wave abnormality Abnormal ECG Confirmed by Thayer Jew [phone]) on 02/14/2020 1:58:12 AM   Radiology No results found.  Procedures Procedures (including critical care time)  Medications Ordered in ED Medications  sodium chloride flush (NS) 0.9 % injection 3 mL (3 mLs Intravenous Not Given 02/14/20 0221)  cephALEXin (KEFLEX) capsule 500 mg (has no administration in time range)    ED Course  I have reviewed the triage vital signs and the nursing notes.  Pertinent labs & imaging results that were available during my care of the patient were reviewed by me and considered in my medical decision making (see chart for details).    MDM Rules/Calculators/A&P                      Patient complaining of lower abdominal pain.  On my exam she points toward the suprapubic region, may be with some left lower quadrant pain as well.  She did not have any focal tenderness with palpation.  Vital signs are stable.  She is well-appearing and in no acute distress.  She does have a history of IBS, but states that this feels different.  She also has history of diverticulosis as reported by her.  This could be developing IBS, or diverticulitis.  I discussed this with the patient.  She also has an abnormal urinalysis.  She denies any urinary symptoms.  At this time, the patient is requesting to be discharged because she is feeling better than when she came.  I think that for now it is reasonable to cover her with some Keflex in case she has developing cystitis, which has been seen previously on CT.  I have advised her to return for new or worsening symptoms.  Patient understands and agrees with the plan.  Final Clinical Impression(s) / ED Diagnoses Final diagnoses:  Suprapubic pain  Abnormal urinalysis    Rx / DC Orders ED Discharge Orders          Ordered    cephALEXin (KEFLEX) 500 MG capsule  2 times daily     02/14/20 0235           Montine Circle, PA-C 02/14/20 0240    Dina Rich, Barbette Hair, MD 02/14/20 [phone]

## 2020-02-19 ENCOUNTER — Other Ambulatory Visit: Payer: Self-pay

## 2020-02-19 ENCOUNTER — Ambulatory Visit: Payer: Medicare Other | Attending: Internal Medicine

## 2020-02-19 DIAGNOSIS — Z23 Encounter for immunization: Secondary | ICD-10-CM | POA: Insufficient documentation

## 2020-02-19 NOTE — Progress Notes (Signed)
   Covid-19 Vaccination Clinic  Name:  Catherine [name]    MRN: [id number] DOB: 23-Oct-1956  02/19/2020  [name] was observed post Covid-19 immunization for 30 minutes based on pre-vaccination screening without incidence. She was provided with Vaccine Information Sheet and instruction to access the V-Safe system.   [name] was instructed to call 911 with any severe reactions post vaccine: Marland Kitchen Difficulty breathing  . Swelling of your face and throat  . A fast heartbeat  . A bad rash all over your body  . Dizziness and weakness    Immunizations Administered    Name Date Dose VIS Date Route   Pfizer COVID-19 Vaccine 02/19/2020 10:05 AM 0.3 mL 12/02/2019 Intramuscular   Manufacturer: [place]   Lot: [id number]   [place]: [id number]

## 2020-02-20 HISTORY — PX: BREAST CYST EXCISION: SHX579

## 2020-03-21 ENCOUNTER — Ambulatory Visit: Payer: Medicare Other | Attending: Internal Medicine

## 2020-03-21 DIAGNOSIS — Z23 Encounter for immunization: Secondary | ICD-10-CM

## 2020-03-21 NOTE — Progress Notes (Signed)
   Covid-19 Vaccination Clinic  Name:  Carlysia Melnichuk    MRN: [id number] DOB: May 18, 1956  03/21/2020  Ms. Hu was observed post Covid-19 immunization for 15 minutes without incident. She was provided with Vaccine Information Sheet and instruction to access the V-Safe system.   Ms. Minchin was instructed to call 911 with any severe reactions post vaccine: Marland Kitchen Difficulty breathing  . Swelling of face and throat  . A fast heartbeat  . A bad rash all over body  . Dizziness and weakness   Immunizations Administered    Name Date Dose VIS Date Route   Pfizer COVID-19 Vaccine 03/21/2020  8:25 AM 0.3 mL 12/02/2019 Intramuscular   Manufacturer: [employer]   Lot: [id number]   [place]: [id number]

## 2020-04-14 ENCOUNTER — Encounter (HOSPITAL_COMMUNITY): Payer: Self-pay | Admitting: Emergency Medicine

## 2020-04-14 ENCOUNTER — Emergency Department (HOSPITAL_COMMUNITY)
Admission: EM | Admit: 2020-04-14 | Discharge: 2020-04-15 | Disposition: A | Discharge status: Home / Self Care | Payer: Medicare Other | Attending: Emergency Medicine | Admitting: Emergency Medicine

## 2020-04-14 ENCOUNTER — Other Ambulatory Visit: Payer: Self-pay

## 2020-04-14 DIAGNOSIS — I2510 Atherosclerotic heart disease of native coronary artery without angina pectoris: Secondary | ICD-10-CM | POA: Diagnosis not present

## 2020-04-14 DIAGNOSIS — I10 Essential (primary) hypertension: Secondary | ICD-10-CM | POA: Diagnosis not present

## 2020-04-14 DIAGNOSIS — N611 Abscess of the breast and nipple: Secondary | ICD-10-CM

## 2020-04-14 DIAGNOSIS — Z79899 Other long term (current) drug therapy: Secondary | ICD-10-CM | POA: Insufficient documentation

## 2020-04-14 DIAGNOSIS — Z7982 Long term (current) use of aspirin: Secondary | ICD-10-CM | POA: Insufficient documentation

## 2020-04-14 DIAGNOSIS — J449 Chronic obstructive pulmonary disease, unspecified: Secondary | ICD-10-CM | POA: Diagnosis not present

## 2020-04-14 NOTE — ED Triage Notes (Signed)
Pt reports finding a lump on her right breast yesterday. Breast is very painful.

## 2020-04-15 DIAGNOSIS — N611 Abscess of the breast and nipple: Secondary | ICD-10-CM | POA: Diagnosis not present

## 2020-04-15 MED ORDER — LIDOCAINE-EPINEPHRINE (PF) 2 %-1:200000 IJ SOLN
10.0000 mL | Freq: Once | INTRAMUSCULAR | Status: AC
  Administered 2020-04-15: 10 mL
  Filled 2020-04-15: qty 20

## 2020-04-15 MED ORDER — IBUPROFEN 800 MG PO TABS
800.0000 mg | ORAL_TABLET | Freq: Once | ORAL | Status: AC
  Administered 2020-04-15: 800 mg via ORAL
  Filled 2020-04-15: qty 1

## 2020-04-15 MED ORDER — SULFAMETHOXAZOLE-TRIMETHOPRIM 800-160 MG PO TABS
1.0000 | ORAL_TABLET | Freq: Once | ORAL | Status: AC
  Administered 2020-04-15: 1 via ORAL
  Filled 2020-04-15: qty 1

## 2020-04-15 MED ORDER — IBUPROFEN 600 MG PO TABS: 600.0000 mg | ORAL_TABLET | Freq: Four times a day (QID) | ORAL | 0 refills | Status: DC | PRN

## 2020-04-15 MED ORDER — SULFAMETHOXAZOLE-TRIMETHOPRIM 800-160 MG PO TABS: 1.0000 | ORAL_TABLET | Freq: Two times a day (BID) | ORAL | 0 refills | Status: AC

## 2020-04-15 MED ORDER — HYDROCODONE-ACETAMINOPHEN 5-325 MG PO TABS
1.0000 | ORAL_TABLET | Freq: Once | ORAL | Status: AC
  Administered 2020-04-15: 1 via ORAL
  Filled 2020-04-15: qty 1

## 2020-04-15 NOTE — ED Provider Notes (Signed)
[facility] EMERGENCY DEPARTMENT Provider Note   CSN: [id number] Arrival date & time: 04/14/20  2219     History Chief Complaint  Patient presents with  . Breast Pain    Catherine [name] is a 64 y.o. female.  Pt presents to the ED today with a knot and pain to her right breast.  She found it on Friday, 4/23.  She has been having a hard time sleeping due to the pain.  Pt has been waiting overnight to be seen.        Past Medical History:  Diagnosis Date  . Anxiety   . Arthritis   . CAD (coronary artery disease)   . COPD (chronic obstructive pulmonary disease) ([place])   . Depression   . Headache    migraines  . Hypertension   . Lumbar spinal stenosis   . Obesity   . Spondylolisthesis at L4-L5 level   . Vertigo   . Wears glasses     Patient Active Problem List   Diagnosis Date Noted  . Spinal stenosis 09/16/2018  . Sinusitis, acute maxillary 12/30/2016  . Dizziness 12/30/2016  . Sinusitis 12/30/2016  . Proteinuria 05/29/2016  . Dysuria 04/03/2016  . Soft tissue lesion of foot 04/03/2016  . Obesity   . Vaginal discharge 10/02/2015  . Other bursal cyst, left ankle and foot 07/19/2015  . Anxiety and depression 05/04/2015  . LEUKOCYTOSIS 10/31/2010  . HYPOKALEMIA 09/18/2010  . GERD 09/18/2010  . TRANSAMINASES, SERUM, ELEVATED 04/08/2010  . DEPRESSION, PROLONGED 12/04/2009  . ANEMIA 11/07/2009  . CHEST PAIN, ATYPICAL 03/28/2009  . SEDIMENTATION RATE, ELEVATED 01/05/2009  . ABSCESS, TOOTH 12/27/2008  . HEPATIC CYST 12/25/2008  . Migraine 05/27/2007  . HYPERLIPIDEMIA 03/10/2007  . CORONARY ARTERY DISEASE 02/27/2007  . ANXIETY DISORDER, GENERALIZED 10/12/2006  . VERTIGO, BENIGN PAROXYSMAL POSITION 10/12/2006  . Essential hypertension 10/12/2006  . DENTAL CARIES 10/12/2006  . INSOMNIA 10/12/2006    Past Surgical History:  Procedure Laterality Date  . CARDIAC CATHETERIZATION    . COLONOSCOPY W/ BIOPSIES AND POLYPECTOMY    . MULTIPLE TOOTH  EXTRACTIONS    . TRANSFORAMINAL LUMBAR INTERBODY FUSION (TLIF) WITH PEDICLE SCREW FIXATION 1 LEVEL Left 09/16/2018   Procedure: LEFT-SIDED LUMBAR 4-5 TRANSFORAMINAL LUMBAR INTERBODY FUSION WITH INSTRUMENTATION AND ALLOGRAFT;  Surgeon: Phylliss Bob, MD;  Location: [place];  Service: Orthopedics;  Laterality: Left;  . TUBAL LIGATION       OB History   No obstetric history on file.     Family History  Problem Relation Age of Onset  . Kidney disease Mother   . Seizures Mother   . Heart failure Father   . Colon cancer Neg Hx   . Breast cancer Neg Hx     Social History   Tobacco Use  . Smoking status: Never Smoker  . Smokeless tobacco: Never Used  Substance Use Topics  . Alcohol use: No  . Drug use: No    Home Medications Prior to Admission medications   Medication Sig Start Date End Date Taking? Authorizing Provider  albuterol (PROVENTIL HFA;VENTOLIN HFA) 108 (90 BASE) MCG/ACT inhaler Inhale 1-2 puffs into the lungs every 6 (six) hours as needed for wheezing or shortness of breath.    [provider]  albuterol (PROVENTIL) (2.5 MG/3ML) 0.083% nebulizer solution Inhale 2.5 mg into the lungs every 4 (four) hours as needed for wheezing or shortness of breath.  06/02/18   [provider]  amLODipine (NORVASC) 10 MG tablet Take 1 tablet (10  mg total) by mouth every morning. 02/11/17   Golden Circle, FNP  aspirin 81 MG chewable tablet Chew 81 mg by mouth daily.    [provider]  BUPAP 50-300 MG TABS Take 1 tablet by mouth every 8 (eight) hours as needed for headache. 09/01/18   [provider]  carvedilol (COREG) 6.25 MG tablet TAKE 1 TABLET BY MOUTH TWO  TIMES DAILY WITH A MEAL Patient taking differently: Take 6.25 mg by mouth 2 (two) times daily with a meal.  11/11/16   Calone, Ples Specter, FNP  cephALEXin (KEFLEX) 500 MG capsule Take 1 capsule (500 mg total) by mouth 2 (two) times daily. 02/14/20   Montine Circle, PA-C  Cholecalciferol (VITAMIN D  PO) Take 1 tablet by mouth daily.    [provider]  clonazePAM (KLONOPIN) 1 MG tablet take 1 TABLET BY MOUTH TWICE DAILY Patient taking differently: Take 1 mg by mouth 2 (two) times daily.  02/12/17   Golden Circle, FNP  dicyclomine (BENTYL) 10 MG capsule Take 1 capsule (10 mg total) by mouth every 8 (eight) hours as needed for spasms. 01/13/20 02/12/20  Mauri Pole, MD  DULoxetine (CYMBALTA) 30 MG capsule Take 1 capsule (30 mg total) by mouth daily. 02/11/17   Golden Circle, FNP  ibuprofen (ADVIL) 600 MG tablet Take 1 tablet (600 mg total) by mouth every 6 (six) hours as needed. 04/15/20   Isla Pence, MD  meclizine (ANTIVERT) 25 MG tablet Take 1 tablet by mouth 3 (three) times daily.    [provider]  naloxone [facility]) 4 MG/0.1ML LIQD nasal spray kit 1 spray.    [provider]  oxyCODONE-acetaminophen (PERCOCET/ROXICET) 5-325 MG tablet Take 1 tablet by mouth every 6 (six) hours.    [provider]  pantoprazole (PROTONIX) 40 MG tablet Take 1 tablet by mouth daily.    [provider]  Peppermint Oil (IBGARD) 90 MG CPCR Take 2 capsules by mouth daily. Take 30-90 minutes before meals with water 01/02/20   Mauri Pole, MD  potassium chloride (K-DUR) 10 MEQ tablet Take 1 tablet (10 mEq total) by mouth daily. 01/20/17   Golden Circle, FNP  rosuvastatin (CRESTOR) 10 MG tablet Take 10 mg by mouth daily.    [provider]  sulfamethoxazole-trimethoprim (BACTRIM DS) 800-160 MG tablet Take 1 tablet by mouth 2 (two) times daily for 7 days. 04/15/20 04/22/20  Isla Pence, MD  SYMBICORT 80-4.5 MCG/ACT inhaler Use 2 puffs TWICE DAILY (120/4=30) Patient taking differently: Inhale 2 puffs into the lungs 2 (two) times daily.  01/20/17   Golden Circle, FNP    Allergies    Other, Azithromycin, and Ciprofloxacin  Review of Systems   Review of Systems  Cardiovascular:       Breast pain on right  All other systems reviewed  and are negative.   Physical Exam Updated Vital Signs BP (!) 148/94 (BP Location: Left Arm)   Pulse 70   Temp 98.7 F (37.1 C) (Oral)   Resp 17   Ht '5\' 2"'$  (1.575 m)   Wt 84.4 kg   SpO2 95%   BMI 34.02 kg/m   Physical Exam Vitals and nursing note reviewed.  Constitutional:      Appearance: Normal appearance. She is obese.  HENT:     Head: Normocephalic and atraumatic.     Right Ear: External ear normal.     Left Ear: External ear normal.     Nose: Nose normal.  Mouth/Throat:     Mouth: Mucous membranes are moist.     Pharynx: Oropharynx is clear.  Eyes:     Extraocular Movements: Extraocular movements intact.     Conjunctiva/sclera: Conjunctivae normal.     Pupils: Pupils are equal, round, and reactive to light.  Cardiovascular:     Rate and Rhythm: Normal rate and regular rhythm.     Pulses: Normal pulses.     Heart sounds: Normal heart sounds.  Pulmonary:     Effort: Pulmonary effort is normal.     Breath sounds: Normal breath sounds.  Chest:     Comments: Small abscess under right breast. Abdominal:     General: Abdomen is flat. Bowel sounds are normal.     Palpations: Abdomen is soft.  Musculoskeletal:        General: Normal range of motion.     Cervical back: Normal range of motion and neck supple.  Skin:    General: Skin is warm.     Capillary Refill: Capillary refill takes less than 2 seconds.  Neurological:     General: No focal deficit present.     Mental Status: She is alert and oriented to person, place, and time.  Psychiatric:        Mood and Affect: Mood normal.        Behavior: Behavior normal.        Thought Content: Thought content normal.        Judgment: Judgment normal.     ED Results / Procedures / Treatments   Labs (all labs ordered are listed, but only abnormal results are displayed) Labs Reviewed - No data to display  EKG None  Radiology No results found.  Procedures .Marland KitchenIncision and Drainage  Date/Time: 04/15/2020 8:43  AM Performed by: Isla Pence, MD Authorized by: Isla Pence, MD   Consent:    Consent obtained:  Verbal   Consent given by:  Patient   Risks discussed:  Bleeding, incomplete drainage and pain   Alternatives discussed:  No treatment Location:    Type:  Abscess   Size:  3   Location:  Trunk   Trunk location:  R breast Pre-procedure details:    Skin preparation:  Chloraprep Anesthesia (see MAR for exact dosages):    Anesthesia method:  Local infiltration   Local anesthetic:  Lidocaine 2% WITH epi Procedure type:    Complexity:  Simple Procedure details:    Incision types:  Cruciate   Scalpel blade:  11   Wound management:  Probed and deloculated   Drainage:  Purulent and bloody   Drainage amount:  Scant   Wound treatment:  Wound left open   Packing materials:  None Post-procedure details:    Patient tolerance of procedure:  Tolerated well, no immediate complications   (including critical care time)  Medications Ordered in ED Medications  ibuprofen (ADVIL) tablet 800 mg (800 mg Oral Given 04/15/20 0818)  HYDROcodone-acetaminophen (NORCO/VICODIN) 5-325 MG per tablet 1 tablet (1 tablet Oral Given 04/15/20 0817)  lidocaine-EPINEPHrine (XYLOCAINE W/EPI) 2 %-1:200000 (PF) injection 10 mL (10 mLs Infiltration Given by Other 04/15/20 0818)  sulfamethoxazole-trimethoprim (BACTRIM DS) 800-160 MG per tablet 1 tablet (1 tablet Oral Given 04/15/20 2248)    ED Course  I have reviewed the triage vital signs and the nursing notes.  Pertinent labs & imaging results that were available during my care of the patient were reviewed by me and considered in my medical decision making (see chart for details).  MDM Rules/Calculators/A&P                      PDMP reviewed.  Pt just had 90 oxycodone 10s filled on 4/23.  Pt d/c with bactrim.  She knows to return if worse.   Final Clinical Impression(s) / ED Diagnoses Final diagnoses:  Breast abscess    Rx / DC Orders ED Discharge  Orders         Ordered    sulfamethoxazole-trimethoprim (BACTRIM DS) 800-160 MG tablet  2 times daily     04/15/20 0832    ibuprofen (ADVIL) 600 MG tablet  Every 6 hours PRN     04/15/20 1443           Isla Pence, MD 04/15/20 [phone]

## 2020-04-25 ENCOUNTER — Other Ambulatory Visit: Payer: Self-pay | Admitting: Family Medicine

## 2020-04-25 DIAGNOSIS — R5381 Other malaise: Secondary | ICD-10-CM

## 2020-05-07 ENCOUNTER — Other Ambulatory Visit: Payer: Self-pay | Admitting: Family Medicine

## 2020-05-07 DIAGNOSIS — Z1231 Encounter for screening mammogram for malignant neoplasm of breast: Secondary | ICD-10-CM

## 2020-05-08 ENCOUNTER — Other Ambulatory Visit: Payer: Self-pay | Admitting: Family Medicine

## 2020-05-08 DIAGNOSIS — E2839 Other primary ovarian failure: Secondary | ICD-10-CM

## 2020-06-01 ENCOUNTER — Other Ambulatory Visit: Payer: Self-pay

## 2020-06-01 ENCOUNTER — Encounter (HOSPITAL_COMMUNITY): Payer: Self-pay | Admitting: Emergency Medicine

## 2020-06-01 ENCOUNTER — Emergency Department (HOSPITAL_COMMUNITY)
Admission: EM | Admit: 2020-06-01 | Discharge: 2020-06-02 | Disposition: A | Discharge status: Home / Self Care | Payer: Medicare Other | Attending: Emergency Medicine | Admitting: Emergency Medicine

## 2020-06-01 DIAGNOSIS — R519 Headache, unspecified: Secondary | ICD-10-CM | POA: Insufficient documentation

## 2020-06-01 DIAGNOSIS — Z5321 Procedure and treatment not carried out due to patient leaving prior to being seen by health care provider: Secondary | ICD-10-CM | POA: Diagnosis not present

## 2020-06-01 LAB — CBC WITH DIFFERENTIAL/PLATELET
Abs Immature Granulocytes: 0 10*3/uL (ref 0.00–0.07)
Basophils Absolute: 0 10*3/uL (ref 0.0–0.1)
Basophils Relative: 1 %
Eosinophils Absolute: 0.1 10*3/uL (ref 0.0–0.5)
Eosinophils Relative: 3 %
HCT: 42.5 % (ref 36.0–46.0)
Hemoglobin: 13.7 g/dL (ref 12.0–15.0)
Immature Granulocytes: 0 %
Lymphocytes Relative: 41 %
Lymphs Abs: 1.2 10*3/uL (ref 0.7–4.0)
MCH: 27.7 pg (ref 26.0–34.0)
MCHC: 32.2 g/dL (ref 30.0–36.0)
MCV: 86 fL (ref 80.0–100.0)
Monocytes Absolute: 0.4 10*3/uL (ref 0.1–1.0)
Monocytes Relative: 14 %
Neutro Abs: 1.2 10*3/uL — ABNORMAL LOW (ref 1.7–7.7)
Neutrophils Relative %: 41 %
Platelets: 260 10*3/uL (ref 150–400)
RBC: 4.94 MIL/uL (ref 3.87–5.11)
RDW: 13.4 % (ref 11.5–15.5)
WBC: 2.9 10*3/uL — ABNORMAL LOW (ref 4.0–10.5)
nRBC: 0 % (ref 0.0–0.2)

## 2020-06-01 LAB — COMPREHENSIVE METABOLIC PANEL
ALT: 15 U/L (ref 0–44)
AST: 23 U/L (ref 15–41)
Albumin: 4.2 g/dL (ref 3.5–5.0)
Alkaline Phosphatase: 95 U/L (ref 38–126)
Anion gap: 7 (ref 5–15)
BUN: 7 mg/dL — ABNORMAL LOW (ref 8–23)
CO2: 25 mmol/L (ref 22–32)
Calcium: 9.7 mg/dL (ref 8.9–10.3)
Chloride: 108 mmol/L (ref 98–111)
Creatinine, Ser: 0.9 mg/dL (ref 0.44–1.00)
GFR calc Af Amer: >60 mL/min (ref >60)
GFR calc non Af Amer: >60 mL/min (ref >60)
Glucose, Bld: 104 mg/dL — ABNORMAL HIGH (ref 70–99)
Potassium: 3.1 mmol/L — ABNORMAL LOW (ref 3.5–5.1)
Sodium: 140 mmol/L (ref 135–145)
Total Bilirubin: 0.4 mg/dL (ref 0.3–1.2)
Total Protein: 7.8 g/dL (ref 6.5–8.1)

## 2020-06-01 NOTE — ED Triage Notes (Addendum)
Patient reports frontal headache" pressure" onset this evening , denies head injury , no fever or chills , denies emesis or photophobia . Patient stated she can not take Tylenol due to stomach upset .

## 2020-06-02 NOTE — ED Notes (Signed)
Pt called 3x for assigned room. No response.

## 2020-06-18 ENCOUNTER — Ambulatory Visit
Admission: RE | Admit: 2020-06-18 | Discharge: 2020-06-18 | Disposition: A | Discharge status: Home / Self Care | Payer: Medicare Other | Source: Ambulatory Visit | Attending: Family Medicine | Admitting: Family Medicine

## 2020-06-18 ENCOUNTER — Other Ambulatory Visit: Payer: Self-pay

## 2020-06-18 DIAGNOSIS — Z1231 Encounter for screening mammogram for malignant neoplasm of breast: Secondary | ICD-10-CM

## 2020-07-18 ENCOUNTER — Other Ambulatory Visit: Payer: Self-pay

## 2020-07-18 ENCOUNTER — Ambulatory Visit
Admission: RE | Admit: 2020-07-18 | Discharge: 2020-07-18 | Disposition: A | Discharge status: Home / Self Care | Payer: Medicare Other | Source: Ambulatory Visit | Attending: Family Medicine | Admitting: Family Medicine

## 2020-07-18 DIAGNOSIS — E2839 Other primary ovarian failure: Secondary | ICD-10-CM

## 2020-07-26 ENCOUNTER — Ambulatory Visit: Payer: Medicare Other | Admitting: Gastroenterology

## 2020-09-04 ENCOUNTER — Ambulatory Visit: Payer: Medicare Other | Admitting: Gastroenterology

## 2020-10-01 ENCOUNTER — Ambulatory Visit (INDEPENDENT_AMBULATORY_CARE_PROVIDER_SITE_OTHER): Payer: Medicare Other | Admitting: Gastroenterology

## 2020-10-01 ENCOUNTER — Encounter: Payer: Self-pay | Admitting: Gastroenterology

## 2020-10-01 VITALS — BP 136/78 | HR 67 | Ht 62.0 in | Wt 182.8 lb

## 2020-10-01 DIAGNOSIS — K581 Irritable bowel syndrome with constipation: Secondary | ICD-10-CM | POA: Diagnosis not present

## 2020-10-01 DIAGNOSIS — R194 Change in bowel habit: Secondary | ICD-10-CM | POA: Diagnosis not present

## 2020-10-01 DIAGNOSIS — K582 Mixed irritable bowel syndrome: Secondary | ICD-10-CM | POA: Insufficient documentation

## 2020-10-01 DIAGNOSIS — R1084 Generalized abdominal pain: Secondary | ICD-10-CM | POA: Diagnosis not present

## 2020-10-01 MED ORDER — BENEFIBER DRINK MIX PO PACK: PACK | ORAL | 1 refills | Status: DC

## 2020-10-01 NOTE — Progress Notes (Signed)
10/01/2020 Catherine [name] [id number] 11/19/1956   HISTORY OF PRESENT ILLNESS:  This is a pleasant 64 year old female who is a patient of Dr. Woodward Ku.  She has a history of IBS. Historically she had been on fiber supplements and MiraLAX daily. She also has dicyclomine that she takes as needed. She also uses IBgard as needed for bloating and gas, which she says helps, but is expensive. She is here today with complaints of her irritable bowel flaring up. She is somewhat of a rather poor historian. At one point she tells me that she gets constipated every once in a while and reports frequent episodes of diarrhea and then a little while later she tells me that she has constipation "just about all the time". Then she tells me that when she eats she gets what sounds like generalized abdominal pain, bloating, and diarrhea. She says it will hurt for hours until it finally subsides. She is no longer on fiber supplements or MiraLAX. She had lost weight, but it appears that her weight is back up about 7 pounds since she was last seen here by Dr. Silverio Decamp in February. She denies seeing blood in her stool.  Colonoscopy 07/17/2016: 9 mm polyp [inflammatory polyp prolapsed mucosa, negative for adenoma] in transverse colon, left side otherwise unremarkable exam.     CT abd & pelvis with contrast:09/07/19 Abnormal thickened appearance of anterior urinary bladder with adjacent fat stranding suspicious for acute cystitis     Past Medical History:  Diagnosis Date  . Anxiety   . Arthritis   . CAD (coronary artery disease)   . COPD (chronic obstructive pulmonary disease) ([place])   . Depression   . Headache    migraines  . Hypertension   . Lumbar spinal stenosis   . Obesity   . Spondylolisthesis at L4-L5 level   . Vertigo   . Wears glasses    Past Surgical History:  Procedure Laterality Date  . CARDIAC CATHETERIZATION    . COLONOSCOPY W/ BIOPSIES AND POLYPECTOMY    . MULTIPLE TOOTH EXTRACTIONS      . TRANSFORAMINAL LUMBAR INTERBODY FUSION (TLIF) WITH PEDICLE SCREW FIXATION 1 LEVEL Left 09/16/2018   Procedure: LEFT-SIDED LUMBAR 4-5 TRANSFORAMINAL LUMBAR INTERBODY FUSION WITH INSTRUMENTATION AND ALLOGRAFT;  Surgeon: Phylliss Bob, MD;  Location: [place];  Service: Orthopedics;  Laterality: Left;  . TUBAL LIGATION      reports that she has never smoked. She has never used smokeless tobacco. She reports that she does not drink alcohol and does not use drugs. family history includes Heart failure in her father; Kidney disease in her mother; Seizures in her mother. Allergies  Allergen Reactions  . Other Nausea Only and Other (See Comments)    Darvocet  . Azithromycin Nausea And Vomiting  . Ciprofloxacin Itching      Outpatient Encounter Medications as of 10/01/2020  Medication Sig  . albuterol (PROVENTIL HFA;VENTOLIN HFA) 108 (90 BASE) MCG/ACT inhaler Inhale 1-2 puffs into the lungs every 6 (six) hours as needed for wheezing or shortness of breath.  Marland Kitchen albuterol (PROVENTIL) (2.5 MG/3ML) 0.083% nebulizer solution Inhale 2.5 mg into the lungs every 4 (four) hours as needed for wheezing or shortness of breath. As needed  . amLODipine (NORVASC) 10 MG tablet Take 1 tablet (10 mg total) by mouth every morning.  Marland Kitchen aspirin 81 MG chewable tablet Chew 81 mg by mouth daily.  Marland Kitchen BUPAP 50-300 MG TABS Take 1 tablet by mouth every 8 (eight) hours as needed  for headache.  . carvedilol (COREG) 6.25 MG tablet TAKE 1 TABLET BY MOUTH TWO  TIMES DAILY WITH A MEAL (Patient taking differently: Take 6.25 mg by mouth 2 (two) times daily with a meal. )  . Cholecalciferol (VITAMIN D PO) Take 1 tablet by mouth daily.  . clonazePAM (KLONOPIN) 1 MG tablet take 1 TABLET BY MOUTH TWICE DAILY (Patient taking differently: Take 1 mg by mouth 2 (two) times daily. )  . dicyclomine (BENTYL) 10 MG capsule Take 1 capsule (10 mg total) by mouth every 8 (eight) hours as needed for spasms.  . meclizine (ANTIVERT) 25 MG tablet Take 1  tablet by mouth 3 (three) times daily.  . naloxone (NARCAN) 4 MG/0.1ML LIQD nasal spray kit 1 spray.  Marland Kitchen omeprazole (PRILOSEC) 20 MG capsule Take 20 mg by mouth daily. Alternates with Pantoprazole as needed.  Marland Kitchen oxyCODONE-acetaminophen (PERCOCET/ROXICET) 5-325 MG tablet Take 1 tablet by mouth every 6 (six) hours.  . pantoprazole (PROTONIX) 40 MG tablet Take 1 tablet by mouth daily.  Marland Kitchen Peppermint Oil (IBGARD) 90 MG CPCR Take 2 capsules by mouth daily. Take 30-90 minutes before meals with water  . potassium chloride (K-DUR) 10 MEQ tablet Take 1 tablet (10 mEq total) by mouth daily.  . rosuvastatin (CRESTOR) 10 MG tablet Take 10 mg by mouth daily.  . SYMBICORT 80-4.5 MCG/ACT inhaler Use 2 puffs TWICE DAILY (120/4=30) (Patient taking differently: Inhale 2 puffs into the lungs 2 (two) times daily. )  . [DISCONTINUED] cephALEXin (KEFLEX) 500 MG capsule Take 1 capsule (500 mg total) by mouth 2 (two) times daily. (Patient not taking: Reported on 10/01/2020)  . [DISCONTINUED] DULoxetine (CYMBALTA) 30 MG capsule Take 1 capsule (30 mg total) by mouth daily. (Patient not taking: Reported on 10/01/2020)  . [DISCONTINUED] ibuprofen (ADVIL) 600 MG tablet Take 1 tablet (600 mg total) by mouth every 6 (six) hours as needed. (Patient not taking: Reported on 10/01/2020)   No facility-administered encounter medications on file as of 10/01/2020.     REVIEW OF SYSTEMS  : All other systems reviewed and negative except where noted in the History of Present Illness.   PHYSICAL EXAM: BP 136/78   Pulse 67   Ht $R'5\' 2"'xE$  (1.575 m)   Wt 182 lb 12.8 oz (82.9 kg)   SpO2 97%   BMI 33.43 kg/m  General: Well developed AA female in no acute distress Head: Normocephalic and atraumatic Eyes:  Sclerae anicteric, conjunctiva pink. Ears: Normal auditory acuity Lungs: Clear throughout to auscultation; no W/R/R. Heart: Regular rate and rhythm; no M/R/G. Abdomen: Soft, non-distended. BS present.  Non-tender. Musculoskeletal:  Symmetrical with no gross deformities  Skin: No lesions on visible extremities Extremities: No edema  Neurological: Alert oriented x 4, grossly non-focal Psychological:  Alert and cooperative. Normal mood and affect  ASSESSMENT AND PLAN: *64 year old female with history of IBS with alternating constipation and diarrhea as well as generalized abdominal pain and gas/bloating.  She had previously been on fiber supplement and MiraLAX, but is no longer taking either of those.  We will will at least have her resume the fiber supplement for now in the form of Benefiber, 2 teaspoons mixed in 8 ounces of liquid daily and increasing to twice daily if tolerated.  Prescription sent to pharmacy.  She has Bentyl, but only uses it on occasion as needed.  I would like her to begin taking it regularly twice a day and increase to 3 times a day if needed.  She says that IBgard tends to  help her gas and bloating, but is expensive so she was given some samples at her appointment today.  She will follow-up with myself or Dr. Silverio Decamp in 4-6 weeks.  CC:  Carylon Perches, NP

## 2020-10-01 NOTE — Patient Instructions (Signed)
If you are age 65 or older, your body mass index should be between 23-30. Your Body mass index is 33.43 kg/m. If this is out of the aforementioned range listed, please consider follow up with your Primary Care Provider.  If you are age 22 or younger, your body mass index should be between 19-25. Your Body mass index is 33.43 kg/m. If this is out of the aformentioned range listed, please consider follow up with your Primary Care Provider.   Start Benefiber 2 teaspoons in 8 ounces of liquid daily.  Schedule Bentyl 20 mg 2-3 times daily.

## 2020-10-02 ENCOUNTER — Other Ambulatory Visit: Payer: Self-pay

## 2020-10-02 ENCOUNTER — Emergency Department (HOSPITAL_COMMUNITY)
Admission: EM | Admit: 2020-10-02 | Discharge: 2020-10-03 | Disposition: A | Discharge status: Home / Self Care | Payer: Medicare Other | Attending: Emergency Medicine | Admitting: Emergency Medicine

## 2020-10-02 ENCOUNTER — Emergency Department (HOSPITAL_COMMUNITY): Payer: Medicare Other

## 2020-10-02 ENCOUNTER — Encounter (HOSPITAL_COMMUNITY): Payer: Self-pay

## 2020-10-02 DIAGNOSIS — Z5321 Procedure and treatment not carried out due to patient leaving prior to being seen by health care provider: Secondary | ICD-10-CM | POA: Diagnosis not present

## 2020-10-02 DIAGNOSIS — R079 Chest pain, unspecified: Secondary | ICD-10-CM | POA: Diagnosis not present

## 2020-10-02 DIAGNOSIS — R0602 Shortness of breath: Secondary | ICD-10-CM | POA: Diagnosis not present

## 2020-10-02 LAB — CBC
HCT: 41.6 % (ref 36.0–46.0)
Hemoglobin: 13 g/dL (ref 12.0–15.0)
MCH: 27.1 pg (ref 26.0–34.0)
MCHC: 31.3 g/dL (ref 30.0–36.0)
MCV: 86.7 fL (ref 80.0–100.0)
Platelets: 268 10*3/uL (ref 150–400)
RBC: 4.8 MIL/uL (ref 3.87–5.11)
RDW: 13.1 % (ref 11.5–15.5)
WBC: 4.3 10*3/uL (ref 4.0–10.5)
nRBC: 0 % (ref 0.0–0.2)

## 2020-10-02 LAB — BASIC METABOLIC PANEL
Anion gap: 11 (ref 5–15)
BUN: 12 mg/dL (ref 8–23)
CO2: 24 mmol/L (ref 22–32)
Calcium: 9.6 mg/dL (ref 8.9–10.3)
Chloride: 105 mmol/L (ref 98–111)
Creatinine, Ser: 1.02 mg/dL — ABNORMAL HIGH (ref 0.44–1.00)
GFR, Estimated: 58 mL/min — ABNORMAL LOW (ref >60)
Glucose, Bld: 155 mg/dL — ABNORMAL HIGH (ref 70–99)
Potassium: 3.6 mmol/L (ref 3.5–5.1)
Sodium: 140 mmol/L (ref 135–145)

## 2020-10-02 LAB — TROPONIN I (HIGH SENSITIVITY)
Troponin I (High Sensitivity): 4 ng/L (ref <18)
Troponin I (High Sensitivity): 5 ng/L (ref <18)

## 2020-10-02 NOTE — ED Triage Notes (Signed)
Pt presents to ED for onset this morning upper chest pain and shortness of breath when walking up stairs to go to bathroom. Pt will burp and pain will subside for brief time.  Denies N/V/D, fever, cough.

## 2020-10-02 NOTE — ED Notes (Signed)
Pt states they are leaving  

## 2020-10-11 NOTE — Progress Notes (Signed)
Reviewed and agree with documentation and assessment and plan. K. Veena Johney Perotti , MD   

## 2020-10-18 ENCOUNTER — Ambulatory Visit (HOSPITAL_COMMUNITY)
Admission: EM | Admit: 2020-10-18 | Discharge: 2020-10-18 | Disposition: A | Discharge status: Home / Self Care | Payer: Medicare Other

## 2020-10-18 ENCOUNTER — Other Ambulatory Visit: Payer: Self-pay

## 2020-10-18 ENCOUNTER — Encounter (HOSPITAL_COMMUNITY): Payer: Self-pay | Admitting: Emergency Medicine

## 2020-10-18 DIAGNOSIS — N644 Mastodynia: Secondary | ICD-10-CM | POA: Diagnosis not present

## 2020-10-18 NOTE — ED Provider Notes (Signed)
[facility]    CSN: [id number] Arrival date & time: 10/18/20  1543      History   Chief Complaint Chief Complaint  Patient presents with  . Breast Pain    HPI Catherine [name] is a 64 y.o. female.    Subjective:   Catherine [name] is an 64 y.o. female who presents for evaluation of a breast pain. Change was noted 4 months ago and has been unchanged since first identified. Patient reports that the pain started after she underwent her annual mammogram screening back in June 2021. Patient states that the left breast was more painful during the screening than the right. The pain is mostly to the upper breast area where she states the machine was pressed very hard. Patient states that her mammogram report was normal. She denies any breast mass. Patient denies nipple discharge. Patient denies any personal or family history of breast cancer.   Notably, the patient reports she went to the ED a couple of weeks for the same. She underwent blood work, EKG and CXR but left prior to being seen by a provider or notified of the results. She has been taking tylenol for her pain with some relief. She reports that she can not take NSAIDs.   The following portions of the patient's history were reviewed and updated as appropriate: allergies, current medications, past family history, past medical history, past social history, past surgical history and problem list.       Past Medical History:  Diagnosis Date  . Anxiety   . Arthritis   . CAD (coronary artery disease)   . COPD (chronic obstructive pulmonary disease) ([place])   . Depression   . Headache    migraines  . Hypertension   . Lumbar spinal stenosis   . Obesity   . Spondylolisthesis at L4-L5 level   . Vertigo   . Wears glasses     Patient Active Problem List   Diagnosis Date Noted  . Irritable bowel syndrome with constipation 10/01/2020  . Altered bowel habits 10/01/2020  . Generalized abdominal pain 10/01/2020  . Spinal  stenosis 09/16/2018  . Sinusitis, acute maxillary 12/30/2016  . Dizziness 12/30/2016  . Sinusitis 12/30/2016  . Proteinuria 05/29/2016  . Dysuria 04/03/2016  . Soft tissue lesion of foot 04/03/2016  . Obesity   . Vaginal discharge 10/02/2015  . Other bursal cyst, left ankle and foot 07/19/2015  . Anxiety and depression 05/04/2015  . LEUKOCYTOSIS 10/31/2010  . HYPOKALEMIA 09/18/2010  . GERD 09/18/2010  . TRANSAMINASES, SERUM, ELEVATED 04/08/2010  . DEPRESSION, PROLONGED 12/04/2009  . ANEMIA 11/07/2009  . CHEST PAIN, ATYPICAL 03/28/2009  . SEDIMENTATION RATE, ELEVATED 01/05/2009  . ABSCESS, TOOTH 12/27/2008  . HEPATIC CYST 12/25/2008  . Migraine 05/27/2007  . HYPERLIPIDEMIA 03/10/2007  . CORONARY ARTERY DISEASE 02/27/2007  . ANXIETY DISORDER, GENERALIZED 10/12/2006  . VERTIGO, BENIGN PAROXYSMAL POSITION 10/12/2006  . Essential hypertension 10/12/2006  . DENTAL CARIES 10/12/2006  . INSOMNIA 10/12/2006    Past Surgical History:  Procedure Laterality Date  . CARDIAC CATHETERIZATION    . COLONOSCOPY W/ BIOPSIES AND POLYPECTOMY    . MULTIPLE TOOTH EXTRACTIONS    . TRANSFORAMINAL LUMBAR INTERBODY FUSION (TLIF) WITH PEDICLE SCREW FIXATION 1 LEVEL Left 09/16/2018   Procedure: LEFT-SIDED LUMBAR 4-5 TRANSFORAMINAL LUMBAR INTERBODY FUSION WITH INSTRUMENTATION AND ALLOGRAFT;  Surgeon: Phylliss Bob, MD;  Location: [facility];  Service: Orthopedics;  Laterality: Left;  . TUBAL LIGATION      OB History   No obstetric history  on file.      Home Medications    Prior to Admission medications   Medication Sig Start Date End Date Taking? Authorizing Provider  albuterol (PROVENTIL HFA;VENTOLIN HFA) 108 (90 BASE) MCG/ACT inhaler Inhale 1-2 puffs into the lungs every 6 (six) hours as needed for wheezing or shortness of breath.   Yes [provider]  albuterol (PROVENTIL) (2.5 MG/3ML) 0.083% nebulizer solution Inhale 2.5 mg into the lungs every 4 (four) hours as needed for wheezing or  shortness of breath. As needed 06/02/18  Yes [provider]  amLODipine (NORVASC) 10 MG tablet Take 1 tablet (10 mg total) by mouth every morning. 02/11/17  Yes Golden Circle, FNP  aspirin 81 MG chewable tablet Chew 81 mg by mouth daily.   Yes [provider]  BUPAP 50-300 MG TABS Take 1 tablet by mouth every 8 (eight) hours as needed for headache. 09/01/18  Yes [provider]  carvedilol (COREG) 6.25 MG tablet TAKE 1 TABLET BY MOUTH TWO  TIMES DAILY WITH A MEAL Patient taking differently: Take 6.25 mg by mouth 2 (two) times daily with a meal.  11/11/16  Yes Golden Circle, FNP  Cholecalciferol (VITAMIN D PO) Take 1 tablet by mouth daily.   Yes [provider]  clonazePAM (KLONOPIN) 1 MG tablet take 1 TABLET BY MOUTH TWICE DAILY Patient taking differently: Take 1 mg by mouth 2 (two) times daily.  02/12/17  Yes Golden Circle, FNP  meclizine (ANTIVERT) 25 MG tablet Take 1 tablet by mouth 3 (three) times daily.   Yes [provider]  naloxone (NARCAN) 4 MG/0.1ML LIQD nasal spray kit 1 spray.   Yes [provider]  omeprazole (PRILOSEC) 20 MG capsule Take 20 mg by mouth daily. Alternates with Pantoprazole as needed.   Yes [provider]  oxyCODONE-acetaminophen (PERCOCET/ROXICET) 5-325 MG tablet Take 1 tablet by mouth every 6 (six) hours.   Yes [provider]  pantoprazole (PROTONIX) 40 MG tablet Take 1 tablet by mouth daily.   Yes [provider]  Peppermint Oil (IBGARD) 90 MG CPCR Take 2 capsules by mouth daily. Take 30-90 minutes before meals with water 01/02/20  Yes Nandigam, Venia Minks, MD  potassium chloride (K-DUR) 10 MEQ tablet Take 1 tablet (10 mEq total) by mouth daily. 01/20/17  Yes Golden Circle, FNP  rosuvastatin (CRESTOR) 10 MG tablet Take 10 mg by mouth daily.   Yes [provider]  SYMBICORT 80-4.5 MCG/ACT inhaler Use 2 puffs TWICE DAILY (120/4=30) Patient taking differently: Inhale 2  puffs into the lungs 2 (two) times daily.  01/20/17  Yes Golden Circle, FNP  Wheat Dextrin (BENEFIBER DRINK MIX) PACK 2 teaspoons daily. 10/01/20  Yes Zehr, Laban Emperor, PA-C  dicyclomine (BENTYL) 10 MG capsule Take 1 capsule (10 mg total) by mouth every 8 (eight) hours as needed for spasms. 01/13/20 10/01/20  Mauri Pole, MD    Family History Family History  Problem Relation Age of Onset  . Kidney disease Mother   . Seizures Mother   . Heart failure Father   . Colon cancer Neg Hx   . Breast cancer Neg Hx     Social History Social History   Tobacco Use  . Smoking status: Never Smoker  . Smokeless tobacco: Never Used  Vaping Use  . Vaping Use: Never used  Substance Use Topics  . Alcohol use: No  . Drug use: No     Allergies   Other, Azithromycin, and Ciprofloxacin  Review of Systems Review of Systems  Respiratory: Negative for shortness of breath.   Cardiovascular: Negative for chest pain and palpitations.       Breast pain   All other systems reviewed and are negative.    Physical Exam Triage Vital Signs ED Triage Vitals  Enc Vitals Group     BP 10/18/20 1700 (!) 157/81     Pulse Rate 10/18/20 1700 69     Resp 10/18/20 1700 18     Temp 10/18/20 1700 98.3 F (36.8 C)     Temp Source 10/18/20 1700 Oral     SpO2 10/18/20 1700 100 %     Weight --      Height --      Head Circumference --      Peak Flow --      Pain Score 10/18/20 1658 7     Pain Loc --      Pain Edu? --      Excl. in [place]? --    No data found.  Updated Vital Signs BP (!) 157/81 (BP Location: Right Arm)   Pulse 69   Temp 98.3 F (36.8 C) (Oral)   Resp 18   SpO2 100%   Visual Acuity Right Eye Distance:   Left Eye Distance:   Bilateral Distance:    Right Eye Near:   Left Eye Near:    Bilateral Near:     Physical Exam Vitals reviewed.  Constitutional:      Appearance: Normal appearance.  HENT:     Head: Normocephalic.  Cardiovascular:     Rate and Rhythm: Normal  rate and regular rhythm.  Pulmonary:     Effort: Pulmonary effort is normal.     Breath sounds: Normal breath sounds.  Chest:     Breasts: Breasts are symmetrical.        Left: Tenderness present. No swelling, inverted nipple, mass, nipple discharge or skin change.    Musculoskeletal:        General: Normal range of motion.     Cervical back: Normal range of motion.  Skin:    General: Skin is warm and dry.  Neurological:     General: No focal deficit present.     Mental Status: She is alert and oriented to person, place, and time.      UC Treatments / Results  Labs (all labs ordered are listed, but only abnormal results are displayed) Labs Reviewed - No data to display  EKG   Radiology No results found.  Procedures Procedures (including critical care time)  Medications Ordered in UC Medications - No data to display  Initial Impression / Assessment and Plan / UC Course  I have reviewed the triage vital signs and the nursing notes.  Pertinent labs & imaging results that were available during my care of the patient were reviewed by me and considered in my medical decision making (see chart for details).    64 yo female presenting with a four-month history left breast pain. Symptoms started after undergoing her annual mammogram. No abnormalities noted on exam. Pain somewhat relieved with Tylenol.  Physical exam unremarkable for any significant breast findings or abnormalities of the nipple.  Advise continued supportive care as well as heat.  Encourage patient to make an appointment with PCP for follow-up and possible ultrasound of the breasts.  Patient verbalized understanding and agreeable.   Today's evaluation has revealed no signs of a dangerous process. Discussed diagnosis with patient and/or guardian. Patient and/or  guardian aware of their diagnosis, possible red flag symptoms to watch out for and need for close follow up. Patient and/or guardian understands verbal and  written discharge instructions. Patient and/or guardian comfortable with plan and disposition.  Patient and/or guardian has a clear mental status at this time, good insight into illness (after discussion and teaching) and has clear judgment to make decisions regarding their care  This care was provided during an unprecedented National Emergency due to the Novel Coronavirus (COVID-19) pandemic. COVID-19 infections and transmission risks place heavy strains on healthcare resources.  As this pandemic evolves, our facility, providers, and staff strive to respond fluidly, to remain operational, and to provide care relative to available resources and information. Outcomes are unpredictable and treatments are without well-defined guidelines. Further, the impact of COVID-19 on all aspects of urgent care, including the impact to patients seeking care for reasons other than COVID-19, is unavoidable during this national emergency. At this time of the global pandemic, management of patients has significantly changed, even for non-COVID positive patients given high local and regional COVID volumes at this time requiring high healthcare system and resource utilization. The standard of care for management of both COVID suspected and non-COVID suspected patients continues to change rapidly at the local, regional, national, and global levels. This patient was worked up and treated to the best available but ever changing evidence and resources available at this current time.   Documentation was completed with the aid of voice recognition software. Transcription may contain typographical errors.  Final Clinical Impressions(s) / UC Diagnoses   Final diagnoses:  Breast pain, left     Discharge Instructions     Take your prescribed Oxycodone-Acetaminophen as needed for severe pain. Apply heat to breast as well. Call your primary care office tomorrow morning and let them know of your visit with [place] today. You should  follow-up with them as soon as possible for an ultrasound of your breast.     ED Prescriptions    None     I have reviewed the PDMP during this encounter.   Enrique Sack, [place] 10/18/20 [phone]

## 2020-10-18 NOTE — ED Triage Notes (Signed)
Pt c/o left breast pain intermittent since June. Pt states she had a mammogram in June and she is still having pain. She states she feels they "ruptured the tissue" Pt states the pain keeps her up at night. She has some SOB for the last couple of weeks. Pt states that sometimes she folds her dish towels under her breast and it helps with the pain.

## 2020-10-18 NOTE — Discharge Instructions (Addendum)
Take your prescribed Oxycodone-Acetaminophen as needed for severe pain. Apply heat to breast as well. Call your primary care office tomorrow morning and let them know of your visit with [place] today. You should follow-up with them as soon as possible for an ultrasound of your breast.

## 2020-10-20 ENCOUNTER — Ambulatory Visit: Payer: Medicare Other | Attending: Internal Medicine

## 2020-10-20 DIAGNOSIS — Z23 Encounter for immunization: Secondary | ICD-10-CM

## 2020-10-20 NOTE — Progress Notes (Signed)
   Covid-19 Vaccination Clinic  Name:  Catherine [name]    MRN: [id number] DOB: 1956/04/07  10/20/2020  Catherine [name] was observed post Covid-19 immunization for 15 minutes without incident. She was provided with Vaccine Information Sheet and instruction to access the V-Safe system.   Catherine [name] was instructed to call 911 with any severe reactions post vaccine: Marland Kitchen Difficulty breathing  . Swelling of face and throat  . A fast heartbeat  . A bad rash all over body  . Dizziness and weakness

## 2020-11-02 ENCOUNTER — Ambulatory Visit: Payer: Self-pay | Admitting: Gastroenterology

## 2020-11-09 ENCOUNTER — Other Ambulatory Visit: Payer: Self-pay | Admitting: Nurse Practitioner

## 2020-11-22 ENCOUNTER — Other Ambulatory Visit: Payer: Self-pay | Admitting: Nurse Practitioner

## 2020-11-22 DIAGNOSIS — Z1231 Encounter for screening mammogram for malignant neoplasm of breast: Secondary | ICD-10-CM

## 2020-11-22 DIAGNOSIS — N644 Mastodynia: Secondary | ICD-10-CM

## 2020-12-03 ENCOUNTER — Ambulatory Visit
Admission: RE | Admit: 2020-12-03 | Discharge: 2020-12-03 | Disposition: A | Discharge status: Home / Self Care | Payer: Medicare Other | Source: Ambulatory Visit | Attending: Nurse Practitioner | Admitting: Nurse Practitioner

## 2020-12-03 ENCOUNTER — Other Ambulatory Visit: Payer: Self-pay

## 2020-12-03 DIAGNOSIS — N644 Mastodynia: Secondary | ICD-10-CM

## 2020-12-25 ENCOUNTER — Ambulatory Visit: Payer: Self-pay | Admitting: Gastroenterology

## 2021-01-05 ENCOUNTER — Other Ambulatory Visit: Payer: Self-pay | Admitting: Nurse Practitioner

## 2021-01-05 DIAGNOSIS — Z13820 Encounter for screening for osteoporosis: Secondary | ICD-10-CM

## 2021-01-17 ENCOUNTER — Encounter: Payer: Self-pay | Admitting: Gastroenterology

## 2021-01-17 ENCOUNTER — Ambulatory Visit (INDEPENDENT_AMBULATORY_CARE_PROVIDER_SITE_OTHER): Payer: Medicare Other | Admitting: Gastroenterology

## 2021-01-17 VITALS — BP 126/80 | HR 68 | Ht 65.0 in | Wt 192.0 lb

## 2021-01-17 DIAGNOSIS — K582 Mixed irritable bowel syndrome: Secondary | ICD-10-CM

## 2021-01-17 MED ORDER — DICYCLOMINE HCL 10 MG PO CAPS: 10.0000 mg | ORAL_CAPSULE | Freq: Two times a day (BID) | ORAL | 3 refills | Status: DC

## 2021-01-17 NOTE — Patient Instructions (Signed)
If you are age 67 or older, your body mass index should be between 23-30. Your Body mass index is 31.95 kg/m. If this is out of the aforementioned range listed, please consider follow up with your Primary Care Provider.  If you are age 51 or younger, your body mass index should be between 19-25. Your Body mass index is 31.95 kg/m. If this is out of the aformentioned range listed, please consider follow up with your Primary Care Provider.   We have sent the following medications to your pharmacy for you to pick up at your convenience: dicyclomine 10 mg   Start Benefiber 2 teaspoons in 8 ounces of liquid daily and may increase to twice daily if tolerated.  Thank you for choosing me and [facility].  Alonza Bogus, PA-C

## 2021-01-17 NOTE — Progress Notes (Signed)
01/17/2021 Catherine [name] [id number] 10/08/1956   HISTORY OF PRESENT ILLNESS:  This is a 65 year old female who is a patient of Dr. Woodward Ku.  She has a history of IBS. Historically she had been on fiber supplements and MiraLAX daily. She also has dicyclomine that she was taking as needed. She also uses IBgard as needed for bloating and gas, which she says helps, but is expensive. She is here today with complaints of her irritable bowel flaring up. I saw her for the same complaints back in October at which time I recommended she restart her Benefiber, begin taking the Bentyl 10 mg twice daily every day, and she was given more samples of IBgard. Today she tells me that she has bouts of diarrhea that then alternate with constipation. She complains of lower abdominal cramps. She never started the Benefiber. Sounds like maybe she took the Bentyl for a while, but she says that she ran out. She did end up buying some IBgard and took that for a while. No rectal bleeding. No weight loss, in fact she has gained 10 lbs. since she was seen here in October. She is somewhat of a rather poor historian.  Colonoscopy 07/17/2016: 9 mm polyp[inflammatory polyp prolapsed mucosa, negative for adenoma]in transverse colon, left side otherwise unremarkable exam.  CT abd & pelvis with contrast:09/07/19 Abnormal thickened appearance of anterior urinary bladder with adjacent fat stranding suspicious foracute cystitis    Past Medical History:  Diagnosis Date  . Anxiety   . Arthritis   . CAD (coronary artery disease)   . COPD (chronic obstructive pulmonary disease) ([place])   . Depression   . Headache    migraines  . Hypertension   . Lumbar spinal stenosis   . Obesity   . Spondylolisthesis at L4-L5 level   . Vertigo   . Wears glasses    Past Surgical History:  Procedure Laterality Date  . CARDIAC CATHETERIZATION    . COLONOSCOPY W/ BIOPSIES AND POLYPECTOMY    . MULTIPLE TOOTH EXTRACTIONS    .  TRANSFORAMINAL LUMBAR INTERBODY FUSION (TLIF) WITH PEDICLE SCREW FIXATION 1 LEVEL Left 09/16/2018   Procedure: LEFT-SIDED LUMBAR 4-5 TRANSFORAMINAL LUMBAR INTERBODY FUSION WITH INSTRUMENTATION AND ALLOGRAFT;  Surgeon: Phylliss Bob, MD;  Location: [facility];  Service: Orthopedics;  Laterality: Left;  . TUBAL LIGATION      reports that she has never smoked. She has never used smokeless tobacco. She reports that she does not drink alcohol and does not use drugs. family history includes Heart failure in her father; Kidney disease in her mother; Seizures in her mother. Allergies  Allergen Reactions  . Other Nausea Only and Other (See Comments)    Darvocet  . Azithromycin Nausea And Vomiting  . Ciprofloxacin Itching      Outpatient Encounter Medications as of 01/17/2021  Medication Sig  . albuterol (PROVENTIL HFA;VENTOLIN HFA) 108 (90 BASE) MCG/ACT inhaler Inhale 1-2 puffs into the lungs every 6 (six) hours as needed for wheezing or shortness of breath.  Marland Kitchen albuterol (PROVENTIL) (2.5 MG/3ML) 0.083% nebulizer solution Inhale 2.5 mg into the lungs every 4 (four) hours as needed for wheezing or shortness of breath. As needed  . amLODipine (NORVASC) 10 MG tablet Take 1 tablet (10 mg total) by mouth every morning.  Marland Kitchen aspirin 81 MG chewable tablet Chew 81 mg by mouth daily.  Marland Kitchen BUPAP 50-300 MG TABS Take 1 tablet by mouth every 8 (eight) hours as needed for headache.  . butalbital-acetaminophen-caffeine (FIORICET) [phone]  MG tablet Take 1-2 tablets by mouth every other day as needed.  . carvedilol (COREG) 6.25 MG tablet TAKE 1 TABLET BY MOUTH TWO  TIMES DAILY WITH A MEAL (Patient taking differently: Take 6.25 mg by mouth 2 (two) times daily with a meal.)  . Cholecalciferol (VITAMIN D PO) Take 1 tablet by mouth daily.  . clonazePAM (KLONOPIN) 1 MG tablet take 1 TABLET BY MOUTH TWICE DAILY (Patient taking differently: Take 1 mg by mouth 2 (two) times daily.)  . diclofenac (VOLTAREN) 75 MG EC tablet Take 75 mg  by mouth 2 (two) times daily as needed.  . diclofenac Sodium (VOLTAREN) 1 % GEL Apply 1 application topically 4 (four) times daily as needed.  . meclizine (ANTIVERT) 25 MG tablet Take 1 tablet by mouth 3 (three) times daily.  . naloxone (NARCAN) 4 MG/0.1ML LIQD nasal spray kit 1 spray.  Marland Kitchen omeprazole (PRILOSEC) 20 MG capsule Take 20 mg by mouth daily. Alternates with Pantoprazole as needed.  Marland Kitchen oxyCODONE-acetaminophen (PERCOCET/ROXICET) 5-325 MG tablet Take 1 tablet by mouth every 6 (six) hours.  . Peppermint Oil (IBGARD) 90 MG CPCR Take 2 capsules by mouth daily. Take 30-90 minutes before meals with water  . potassium chloride (K-DUR) 10 MEQ tablet Take 1 tablet (10 mEq total) by mouth daily.  . rosuvastatin (CRESTOR) 10 MG tablet Take 10 mg by mouth daily.  . SYMBICORT 80-4.5 MCG/ACT inhaler Use 2 puffs TWICE DAILY (120/4=30) (Patient taking differently: Inhale 2 puffs into the lungs 2 (two) times daily.)  . Wheat Dextrin (BENEFIBER DRINK MIX) PACK 2 teaspoons daily.  Marland Kitchen dicyclomine (BENTYL) 10 MG capsule Take 1 capsule (10 mg total) by mouth every 8 (eight) hours as needed for spasms.  . [DISCONTINUED] pantoprazole (PROTONIX) 40 MG tablet Take 1 tablet by mouth daily.   No facility-administered encounter medications on file as of 01/17/2021.     REVIEW OF SYSTEMS  : All other systems reviewed and negative except where noted in the History of Present Illness.   PHYSICAL EXAM: Ht $Remove'5\' 5"'QwqSYsn$  (1.651 m)   Wt 192 lb (87.1 kg)   BMI 31.95 kg/m  General: Well developed AA female in no acute distress Head: Normocephalic and atraumatic Eyes:  Sclerae anicteric, conjunctiva pink. Ears: Normal auditory acuity Lungs: Clear throughout to auscultation; no W/R/R. Heart: Regular rate and rhythm; no M/R/G. Abdomen: Soft, non-distended.  BS present.  Non-tender. Musculoskeletal: Symmetrical with no gross deformities  Skin: No lesions on visible extremities Extremities: No edema  Neurological: Alert  oriented x 4, grossly non-focal Psychological:  Alert and cooperative. Normal mood and affect  ASSESSMENT AND PLAN: 65 year old female with history of IBS with alternating constipation and diarrhea as well as generalized lower abdominal pain and gas/bloating.  She had previously been on fiber supplement and MiraLAX, but is no longer taking either of those despite my recommendation to resume the fiber supplement in the form of Benefiber, 2 teaspoons mixed in 8 ounces of liquid daily and increasing to twice daily if tolerated at her last visit.  Recommended this again.  She is out of Bentyl.  At her last visit I had recommended that she take 10 mg twice daily.  We will send new prescription to pharmacy.  She says that IBgard tends to help her gas and bloating, but is expensive so she was given more samples at her appointment today.  She also asked if she could try FDgard and she was given samples of this as well.  She will follow-up in  6-8 weeks.    CC:  Vonna Drafts, FNP

## 2021-02-18 NOTE — Progress Notes (Signed)
Reviewed and agree with documentation and assessment and plan. K. Veena Nandigam , MD   

## 2021-03-14 ENCOUNTER — Ambulatory Visit: Payer: Medicare Other | Admitting: Gastroenterology

## 2021-04-09 ENCOUNTER — Other Ambulatory Visit: Payer: Self-pay | Admitting: Gastroenterology

## 2021-05-13 ENCOUNTER — Other Ambulatory Visit: Payer: Self-pay | Admitting: Nurse Practitioner

## 2021-05-13 DIAGNOSIS — Z1231 Encounter for screening mammogram for malignant neoplasm of breast: Secondary | ICD-10-CM

## 2021-05-15 ENCOUNTER — Ambulatory Visit: Payer: Medicare Other | Admitting: Gastroenterology

## 2021-05-29 ENCOUNTER — Other Ambulatory Visit: Payer: Self-pay

## 2021-05-29 MED ORDER — DICYCLOMINE HCL 10 MG PO CAPS: 10.0000 mg | ORAL_CAPSULE | Freq: Two times a day (BID) | ORAL | 0 refills | Status: DC

## 2021-06-05 IMAGING — CT CT ABD-PELV W/ CM
2 of 5 series · 17 of 46 positions shown, 19 images · IV contrast (Omni 300)
Comparison: 05/21/2019, 07/31/2011

CLINICAL DATA: Abdominal bloating, diarrhea, burning during
urination.

EXAM:
CT ABDOMEN AND PELVIS WITH CONTRAST
TECHNIQUE: Multidetector CT imaging of the abdomen and pelvis was performed
using the standard protocol following bolus administration of
intravenous contrast.
CONTRAST:  100mL OMNIPAQUE IOHEXOL 300 MG/ML  SOLN

[Series 3: a/p w/ 5mm · axial · 0.83mm/px · z∈[-350,+5]mm · 14 of 81 slices shown, 16 images]
[im 5/81  soft-tissue]
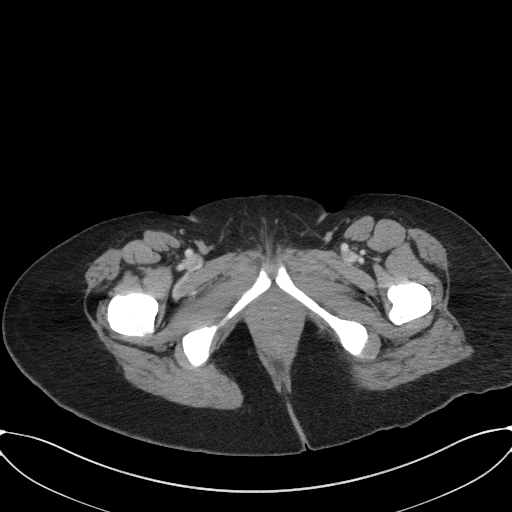
[im 5/81  bone]
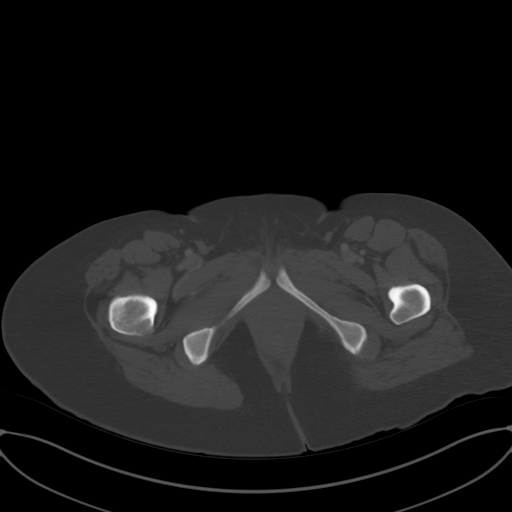
[im 9/81  soft-tissue]
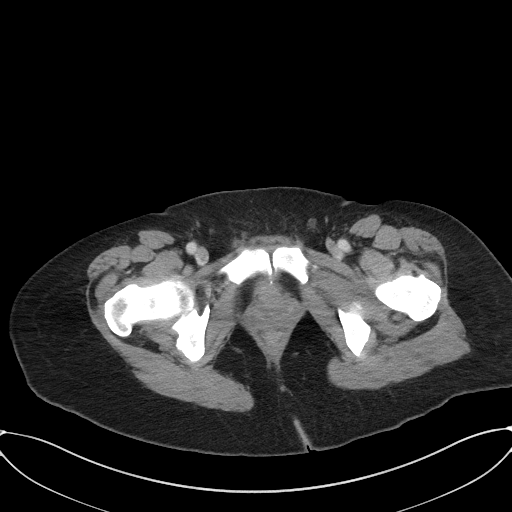
[im 17/81  soft-tissue]
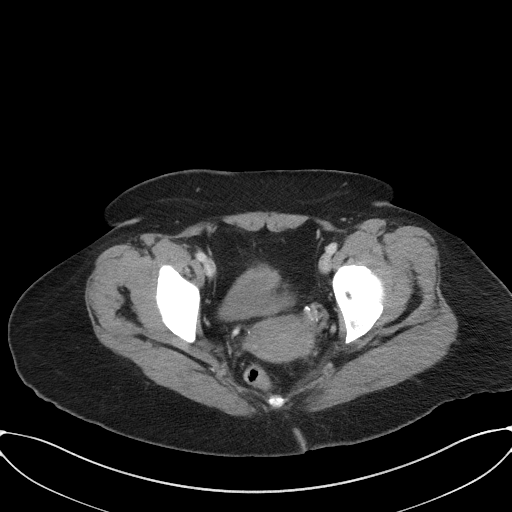
[im 22/81  soft-tissue]
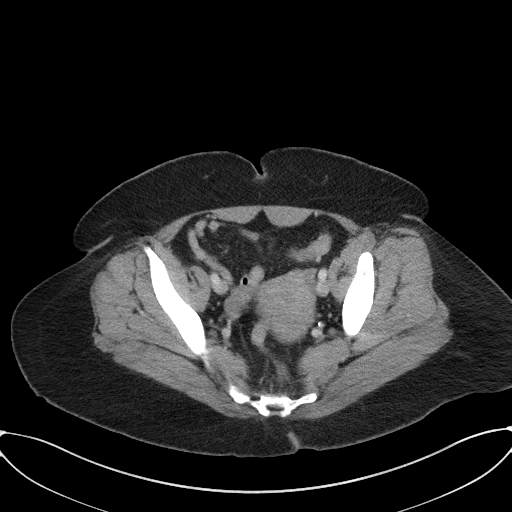
[im 26/81  soft-tissue]
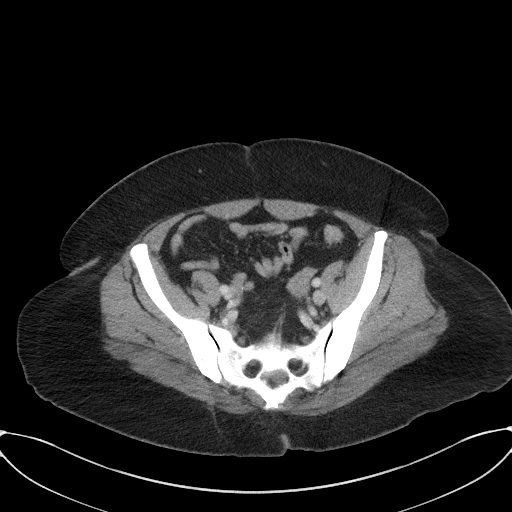
[im 34/81  soft-tissue]
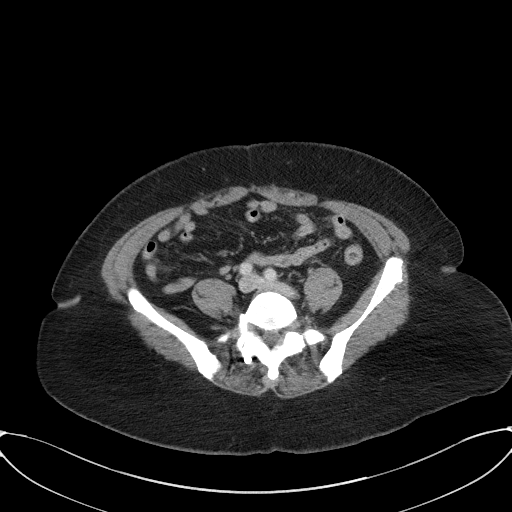
[im 38/81  soft-tissue]
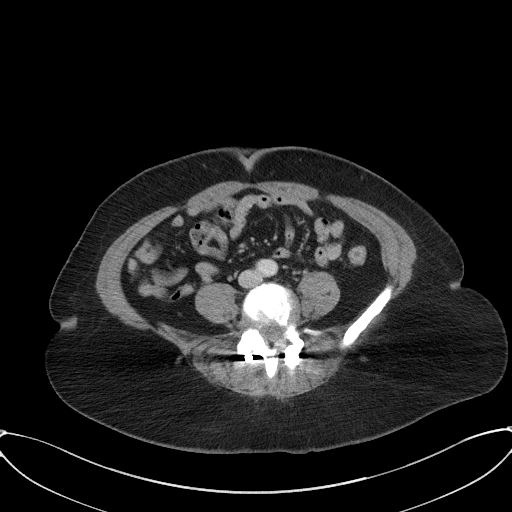
[im 43/81  soft-tissue]
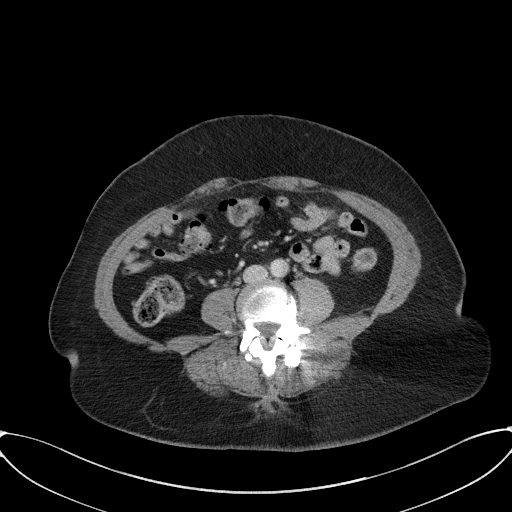
[im 47/81  soft-tissue]
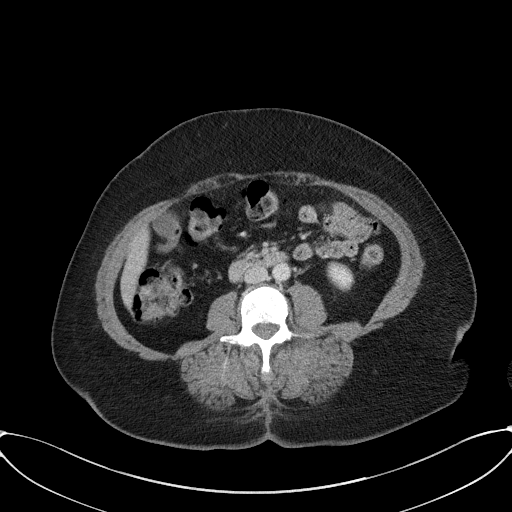
[im 47/81  bone]
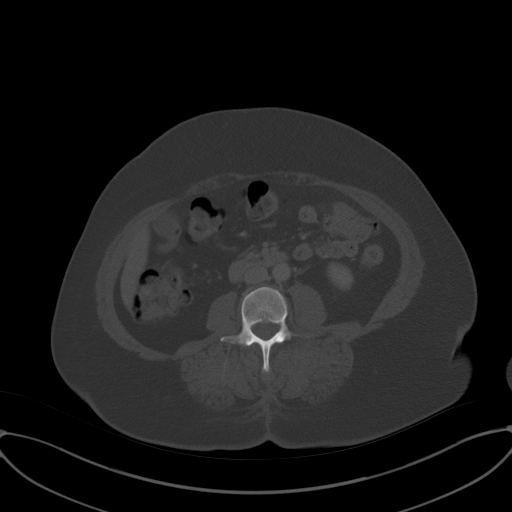
[im 55/81  soft-tissue]
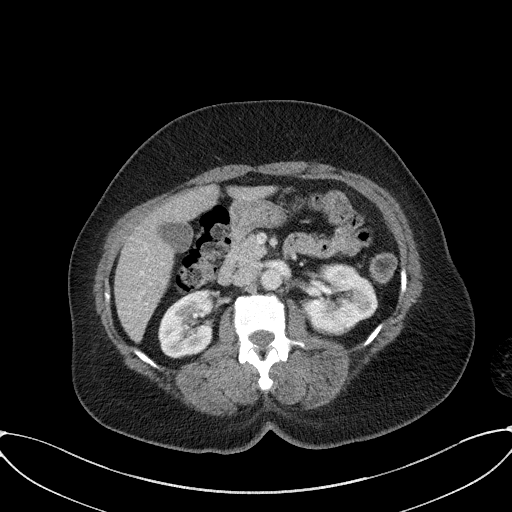
[im 59/81  soft-tissue]
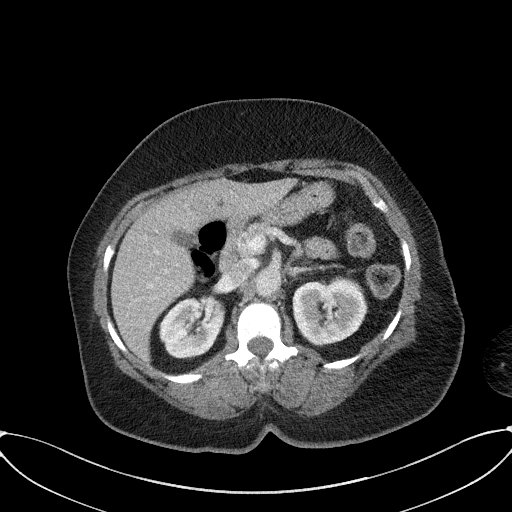
[im 64/81  soft-tissue]
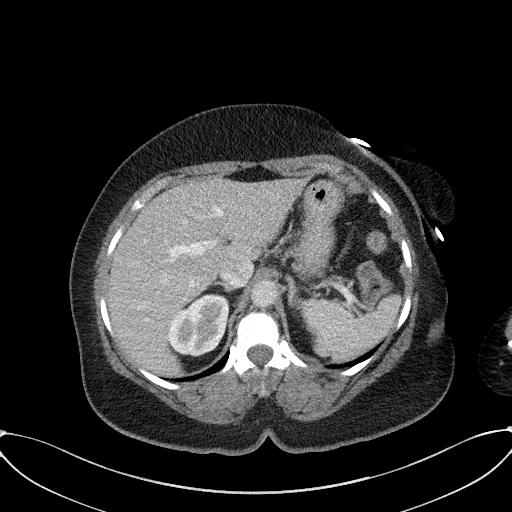
[im 72/81  soft-tissue]
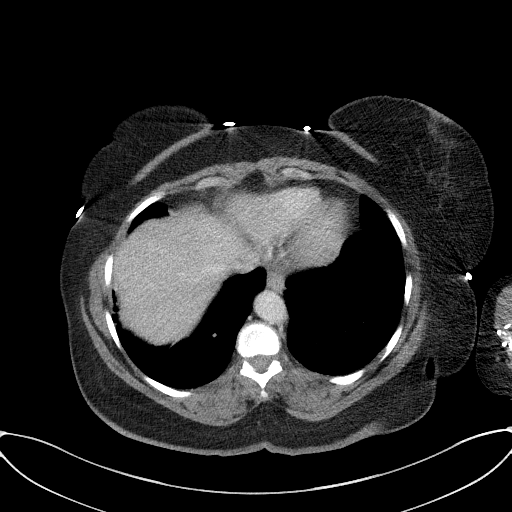
[im 76/81  soft-tissue]
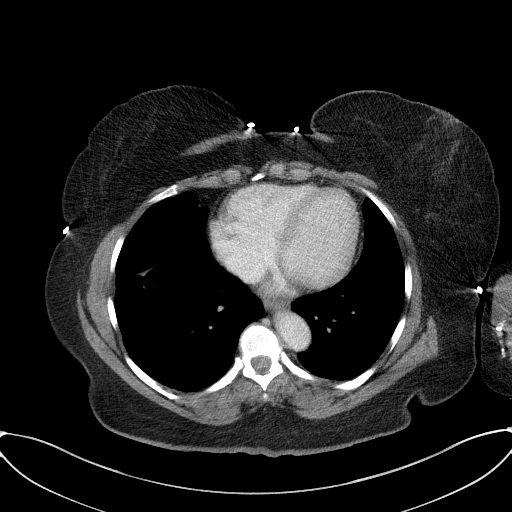

[Series 6: a/p w/ cor · coronal · 0.77mm/px · 3 of 150 slices shown]
[im 50/150  soft-tissue]
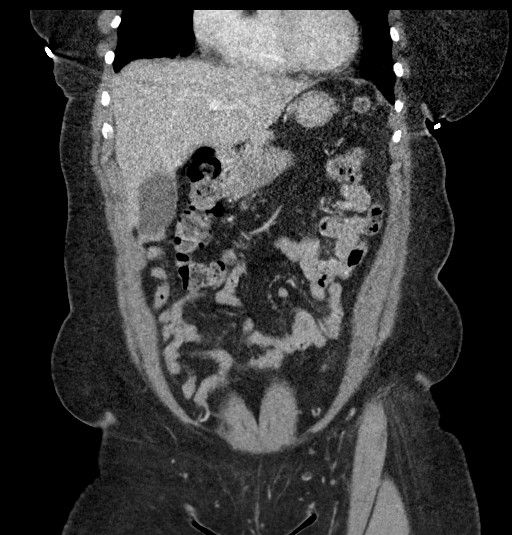
[im 67/150  soft-tissue]
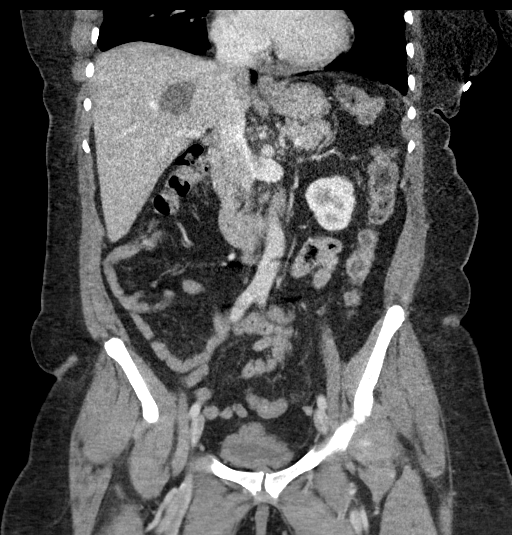
[im 83/150  soft-tissue]
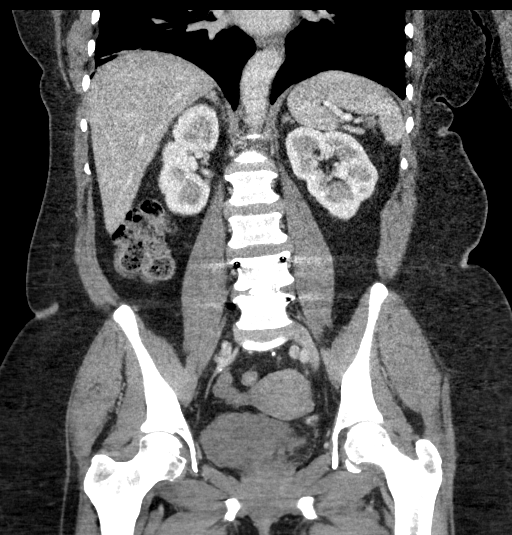

[17 of 46 positions shown; findings below may reference images not displayed]

FINDINGS: Lower chest: No acute abnormality.

Hepatobiliary: Stable low-density lesions within the liver.
Gallbladder unremarkable. No intrahepatic biliary dilatation.

Pancreas: Unremarkable. No pancreatic ductal dilatation or
surrounding inflammatory changes.

Spleen: Normal in size without focal abnormality.

Adrenals/Urinary Tract: Unremarkable adrenal glands. Kidneys enhance
symmetrically without focal lesion or hydronephrosis. Abnormally
thickened appearance of the anterior wall of the urinary bladder
with adjacent fat stranding (series 3, image 68).

Stomach/Bowel: Stomach is within normal limits. Scattered colonic
diverticulosis. No evidence of bowel wall thickening, distention, or
inflammatory changes.

Vascular/Lymphatic: No significant vascular findings are present. No
enlarged abdominal or pelvic lymph nodes.

Reproductive: Uterus and bilateral adnexa are unremarkable.

Other: No abdominal wall hernia or abnormality. No abdominopelvic
ascites.

Musculoskeletal: Prior fusion of L4-5.  No acute osseous findings.
IMPRESSION: 1. Abnormally thickened appearance of the anterior urinary bladder
wall with adjacent fat stranding suspicious for cystitis.
2. Colonic diverticulosis without evidence of acute diverticulitis.

## 2021-07-09 ENCOUNTER — Other Ambulatory Visit: Payer: Self-pay

## 2021-07-09 ENCOUNTER — Ambulatory Visit
Admission: RE | Admit: 2021-07-09 | Discharge: 2021-07-09 | Disposition: A | Discharge status: Home / Self Care | Payer: Medicare Other | Source: Ambulatory Visit | Attending: Nurse Practitioner | Admitting: Nurse Practitioner

## 2021-07-09 DIAGNOSIS — Z1231 Encounter for screening mammogram for malignant neoplasm of breast: Secondary | ICD-10-CM

## 2021-07-11 ENCOUNTER — Other Ambulatory Visit: Payer: Self-pay | Admitting: Nurse Practitioner

## 2021-07-11 DIAGNOSIS — R928 Other abnormal and inconclusive findings on diagnostic imaging of breast: Secondary | ICD-10-CM

## 2021-07-13 ENCOUNTER — Encounter: Payer: Self-pay | Admitting: Gastroenterology

## 2021-07-22 HISTORY — PX: BREAST BIOPSY: SHX20

## 2021-07-31 ENCOUNTER — Other Ambulatory Visit: Payer: Self-pay | Admitting: Nurse Practitioner

## 2021-07-31 ENCOUNTER — Ambulatory Visit
Admission: RE | Admit: 2021-07-31 | Discharge: 2021-07-31 | Disposition: A | Discharge status: Home / Self Care | Payer: Medicare Other | Source: Ambulatory Visit | Attending: Nurse Practitioner | Admitting: Nurse Practitioner

## 2021-07-31 ENCOUNTER — Other Ambulatory Visit: Payer: Self-pay

## 2021-07-31 DIAGNOSIS — R928 Other abnormal and inconclusive findings on diagnostic imaging of breast: Secondary | ICD-10-CM

## 2021-08-16 ENCOUNTER — Ambulatory Visit (INDEPENDENT_AMBULATORY_CARE_PROVIDER_SITE_OTHER): Payer: Medicare Other | Admitting: Gastroenterology

## 2021-08-16 ENCOUNTER — Encounter: Payer: Self-pay | Admitting: Gastroenterology

## 2021-08-16 VITALS — BP 126/80 | HR 68 | Ht 59.75 in | Wt 191.8 lb

## 2021-08-16 DIAGNOSIS — R140 Abdominal distension (gaseous): Secondary | ICD-10-CM | POA: Diagnosis not present

## 2021-08-16 DIAGNOSIS — K581 Irritable bowel syndrome with constipation: Secondary | ICD-10-CM | POA: Diagnosis not present

## 2021-08-16 DIAGNOSIS — K5904 Chronic idiopathic constipation: Secondary | ICD-10-CM

## 2021-08-16 DIAGNOSIS — Z86010 Personal history of colonic polyps: Secondary | ICD-10-CM | POA: Diagnosis not present

## 2021-08-16 MED ORDER — GLYCOPYRROLATE 2 MG PO TABS: 2.0000 mg | ORAL_TABLET | Freq: Two times a day (BID) | ORAL | 3 refills | Status: DC

## 2021-08-16 MED ORDER — LINACLOTIDE 145 MCG PO CAPS: 145.0000 ug | ORAL_CAPSULE | Freq: Every day | ORAL | 2 refills | Status: DC

## 2021-08-16 NOTE — Progress Notes (Signed)
Catherine [name]    [id number]    1956/12/15  Primary Care Physician:Cullop, Nena Alexander, NP  Referring Physician: Vonna Drafts, [place],  [place] [address]   Chief complaint:  IBS-Constipation  HPI:  65 year old female here for follow-up visit for chronic irritable bowel syndrome predominant constipation    On average she is having 3-4 bowel movements per week.  Denies any abdominal pain but she continues to have abdominal bloating. She has intermittent episodes of severe constipation. She is using Miralax as needed .  Denies any decreased appetite, nausea, vomiting, melena or blood per rectum.   Colonoscopy 07/17/2016: 9 mm polyp [inflammatory polyp prolapsed mucosa, negative for adenoma] in transverse colon, left side otherwise unremarkable exam.     CT abd & pelvis with contrast:09/07/19 Abnormal thickened appearance of anterior urinary bladder with adjacent fat stranding suspicious for acute cystitis   Outpatient Encounter Medications as of 08/16/2021  Medication Sig   albuterol (PROVENTIL HFA;VENTOLIN HFA) 108 (90 BASE) MCG/ACT inhaler Inhale 1-2 puffs into the lungs every 6 (six) hours as needed for wheezing or shortness of breath.   albuterol (PROVENTIL) (2.5 MG/3ML) 0.083% nebulizer solution Inhale 2.5 mg into the lungs every 4 (four) hours as needed for wheezing or shortness of breath. As needed   amLODipine (NORVASC) 10 MG tablet Take 1 tablet (10 mg total) by mouth every morning.   aspirin 81 MG chewable tablet Chew 81 mg by mouth daily.   BUPAP 50-300 MG TABS Take 1 tablet by mouth every 8 (eight) hours as needed for headache.   butalbital-acetaminophen-caffeine (FIORICET) 50-325-40 MG tablet Take 1-2 tablets by mouth every other day as needed.   carvedilol (COREG) 6.25 MG tablet TAKE 1 TABLET BY MOUTH TWO  TIMES DAILY WITH A MEAL (Patient taking differently: Take 6.25 mg by mouth 2 (two) times daily with a meal.)    Cholecalciferol (VITAMIN D PO) Take 1 tablet by mouth daily.   clonazePAM (KLONOPIN) 1 MG tablet take 1 TABLET BY MOUTH TWICE DAILY (Patient taking differently: Take 1 mg by mouth 2 (two) times daily.)   diclofenac (VOLTAREN) 75 MG EC tablet Take 75 mg by mouth 2 (two) times daily as needed.   diclofenac Sodium (VOLTAREN) 1 % GEL Apply 1 application topically 4 (four) times daily as needed.   dicyclomine (BENTYL) 10 MG capsule Take 1 capsule (10 mg total) by mouth in the morning and at bedtime.   meclizine (ANTIVERT) 25 MG tablet Take 1 tablet by mouth 3 (three) times daily.   naloxone (NARCAN) 4 MG/0.1ML LIQD nasal spray kit 1 spray.   omeprazole (PRILOSEC) 20 MG capsule Take 20 mg by mouth daily. Alternates with Pantoprazole as needed.   oxyCODONE-acetaminophen (PERCOCET/ROXICET) 5-325 MG tablet Take 1 tablet by mouth every 6 (six) hours.   Peppermint Oil (IBGARD) 90 MG CPCR Take 2 capsules by mouth daily. Take 30-90 minutes before meals with water   potassium chloride (K-DUR) 10 MEQ tablet Take 1 tablet (10 mEq total) by mouth daily.   rosuvastatin (CRESTOR) 10 MG tablet Take 10 mg by mouth daily.   SYMBICORT 80-4.5 MCG/ACT inhaler Use 2 puffs TWICE DAILY (120/4=30) (Patient taking differently: Inhale 2 puffs into the lungs 2 (two) times daily.)   Wheat Dextrin (BENEFIBER DRINK MIX) PACK 2 teaspoons daily.   [DISCONTINUED] Magnesium 400 MG TABS Take 2 tablets by mouth daily.   No facility-administered encounter medications on file as  of 08/16/2021.    Allergies as of 08/16/2021 - Review Complete 08/16/2021  Allergen Reaction Noted   Other Nausea Only and Other (See Comments) 04/21/2016   Azithromycin Nausea And Vomiting 01/14/2017   Ciprofloxacin Itching 04/13/2008    Past Medical History:  Diagnosis Date   Anxiety    Arthritis    CAD (coronary artery disease)    COPD (chronic obstructive pulmonary disease) (HCC)    Depression    Headache    migraines   Hypertension    Lumbar  spinal stenosis    Obesity    Spondylolisthesis at L4-L5 level    Vertigo    Wears glasses     Past Surgical History:  Procedure Laterality Date   CARDIAC CATHETERIZATION     COLONOSCOPY W/ BIOPSIES AND POLYPECTOMY     MULTIPLE TOOTH EXTRACTIONS     TRANSFORAMINAL LUMBAR INTERBODY FUSION (TLIF) WITH PEDICLE SCREW FIXATION 1 LEVEL Left 09/16/2018   Procedure: LEFT-SIDED LUMBAR 4-5 TRANSFORAMINAL LUMBAR INTERBODY FUSION WITH INSTRUMENTATION AND ALLOGRAFT;  Surgeon: Phylliss Bob, MD;  Location: [facility];  Service: Orthopedics;  Laterality: Left;   TUBAL LIGATION      Family History  Problem Relation Age of Onset   Kidney disease Mother    Seizures Mother    Heart failure Father    Colon cancer Neg Hx    Breast cancer Neg Hx    Pancreatic cancer Neg Hx    Stomach cancer Neg Hx    Esophageal cancer Neg Hx    Liver disease Neg Hx     Social History   Socioeconomic History   Marital status: Single    Spouse name: Not on file   Number of children: 2   Years of education: 9   Highest education level: Not on file  Occupational History   Occupation: Disability    Comment: Heart Disease  Tobacco Use   Smoking status: Never   Smokeless tobacco: Never  Vaping Use   Vaping Use: Never used  Substance and Sexual Activity   Alcohol use: No   Drug use: No   Sexual activity: Yes    Birth control/protection: Surgical  Other Topics Concern   Not on file  Social History Narrative   Lives with 58 yo son. Mother passed in 2018   Social Determinants of Health   Financial Resource Strain: Not on file  Food Insecurity: Not on file  Transportation Needs: Not on file  Physical Activity: Not on file  Stress: Not on file  Social Connections: Not on file  Intimate Partner Violence: Not on file      Review of systems: All other review of systems negative except as mentioned in the HPI.   Physical Exam: There were no vitals filed for this visit. Body mass index is 37.77  kg/m. Gen:      No acute distress HEENT:  sclera anicteric Abd:      soft, non-tender; no palpable masses, no distension Ext:    No edema Neuro: alert and oriented x 3 Psych: normal mood and affect  Data Reviewed:  Reviewed labs, radiology imaging, old records and pertinent past GI work up   Assessment and Plan/Recommendations:  65 year old female with history of left-sided diverticulosis, irritable bowel syndrome predominant constipation   IBS predominant constipation: Linzess 147mcg daily  Use MiraLAX 1 capful daily as needed Increase dietary fiber and water intake   IBS with intermittent abdominal cramping: Use IBgard 1 capsule up to 3 times daily as needed Will  send Rx for Robinul $RemoveBef'2mg'[id number]$  BID as needed for severe symptoms   61mm inflammatory polyp removed from transverse colon, given the right side lesion and size will plan for recall colonoscopy in 7 years in July 2024     Return in 6 months or sooner if needed  The patient was provided an opportunity to ask questions and all were answered. The patient agreed with the plan and demonstrated an understanding of the instructions.  Damaris Hippo , MD    CC: Vonna Drafts, FNP

## 2021-08-16 NOTE — Patient Instructions (Addendum)
Your colonoscopy recall has changed to 06/2023, you will receive a reminder letter  Take IBGard 1 tablet twice daily as needed  We have sent Robinul and Linzess to your pharmacy  Follow up in 3 months, you will need to call the office back for that appointment  If you are age 67 or older, your body mass index should be between 23-30. Your Body mass index is 37.77 kg/m. If this is out of the aforementioned range listed, please consider follow up with your Primary Care Provider.  If you are age 65 or younger, your body mass index should be between 19-25. Your Body mass index is 37.77 kg/m. If this is out of the aformentioned range listed, please consider follow up with your Primary Care Provider.   __________________________________________________________  The [facility] providers would like to encourage you to use [facility] to communicate with providers for non-urgent requests or questions.  Due to long hold times on the telephone, sending your provider a message by [facility] may be a faster and more efficient way to get a response.  Please allow 48 business hours for a response.  Please remember that this is for non-urgent requests.    I appreciate the  opportunity to care for you  Thank You   Harl Bowie , MD

## 2021-08-19 ENCOUNTER — Encounter: Payer: Self-pay | Admitting: Gastroenterology

## 2021-08-21 ENCOUNTER — Ambulatory Visit
Admission: RE | Admit: 2021-08-21 | Discharge: 2021-08-21 | Disposition: A | Discharge status: Home / Self Care | Payer: Medicare Other | Source: Ambulatory Visit | Attending: Nurse Practitioner | Admitting: Nurse Practitioner

## 2021-08-21 ENCOUNTER — Other Ambulatory Visit: Payer: Self-pay | Admitting: Nurse Practitioner

## 2021-08-21 ENCOUNTER — Other Ambulatory Visit: Payer: Self-pay

## 2021-08-21 DIAGNOSIS — R928 Other abnormal and inconclusive findings on diagnostic imaging of breast: Secondary | ICD-10-CM

## 2021-09-02 ENCOUNTER — Telehealth: Payer: Self-pay | Admitting: Gastroenterology

## 2021-09-02 MED ORDER — DICYCLOMINE HCL 10 MG PO CAPS: 10.0000 mg | ORAL_CAPSULE | Freq: Two times a day (BID) | ORAL | 2 refills | Status: DC

## 2021-09-02 NOTE — Telephone Encounter (Signed)
Bentyl has been sent to patient's pharmacy. I have called the patient she states that the Linzess has caused her to have loose bowel movements and her legs are weak from going to the bathroom so many times. Please advise what changes need to be made to the [place].

## 2021-09-03 NOTE — Telephone Encounter (Signed)
Please advise patient to use lower dose Linzess 37mg daily but if she still has diarrhea, will need to stop Linzess.

## 2021-09-04 MED ORDER — LINACLOTIDE 72 MCG PO CAPS: 145.0000 ug | ORAL_CAPSULE | Freq: Every day | ORAL | 1 refills | Status: DC

## 2021-09-04 NOTE — Telephone Encounter (Signed)
Advised patient per Dr. Silverio Decamp she needs to reduce the dose of her Linzess. I let her know I would send in a new prescription. She will call the office in a few weeks to let us know if she still has diarrhea after the decrease.

## 2021-09-05 ENCOUNTER — Telehealth: Payer: Self-pay | Admitting: Gastroenterology

## 2021-09-05 MED ORDER — LINACLOTIDE 145 MCG PO CAPS: 145.0000 ug | ORAL_CAPSULE | Freq: Every day | ORAL | 3 refills | Status: DC

## 2021-09-05 NOTE — Telephone Encounter (Signed)
Called pharmacy back, phoned in [place] 145 mcg today.

## 2021-09-30 ENCOUNTER — Telehealth: Payer: Self-pay | Admitting: *Deleted

## 2021-09-30 NOTE — Telephone Encounter (Signed)
prior authorization for glycopyrrolate  done today through Cover My Meds

## 2021-10-15 NOTE — Telephone Encounter (Signed)
Patient informed. 

## 2021-10-15 NOTE — Telephone Encounter (Signed)
Patient scheduled for 11/05/2021 at 2:10 pm. Will contact pt to inform

## 2021-10-15 NOTE — Telephone Encounter (Signed)
Glycopyrrolate denied through the patients insurance. I called the patient and she said she did not know anything about glycopyrrolate and has been taking dicyclomine for her abdominal spasms and cramping. She said it works and she also had been using the IBGard as well but she still continues to have diarrhea. She does not think its caused by any medicine she is taking and still c/o abdominal pain with bowel movements   Dr Darleen Crocker

## 2021-10-15 NOTE — Telephone Encounter (Signed)
Ok, please advise patient to continue with dicyclomine and IBgard as needed for abdominal spasms and cramping.  Please schedule follow-up office visit next available appointment with me or app to discuss further management of her symptoms.  Thank you

## 2021-10-17 ENCOUNTER — Emergency Department (HOSPITAL_COMMUNITY)
Admission: EM | Admit: 2021-10-17 | Discharge: 2021-10-18 | Disposition: A | Discharge status: Home / Self Care | Payer: Medicare Other | Attending: Emergency Medicine | Admitting: Emergency Medicine

## 2021-10-17 ENCOUNTER — Encounter (HOSPITAL_COMMUNITY): Payer: Self-pay | Admitting: Emergency Medicine

## 2021-10-17 ENCOUNTER — Other Ambulatory Visit: Payer: Self-pay

## 2021-10-17 ENCOUNTER — Emergency Department (HOSPITAL_COMMUNITY): Payer: Medicare Other

## 2021-10-17 DIAGNOSIS — I10 Essential (primary) hypertension: Secondary | ICD-10-CM | POA: Insufficient documentation

## 2021-10-17 DIAGNOSIS — R0789 Other chest pain: Secondary | ICD-10-CM | POA: Insufficient documentation

## 2021-10-17 DIAGNOSIS — Z5321 Procedure and treatment not carried out due to patient leaving prior to being seen by health care provider: Secondary | ICD-10-CM | POA: Diagnosis not present

## 2021-10-17 DIAGNOSIS — R0602 Shortness of breath: Secondary | ICD-10-CM | POA: Insufficient documentation

## 2021-10-17 LAB — CBC WITH DIFFERENTIAL/PLATELET
Abs Immature Granulocytes: 0.01 10*3/uL (ref 0.00–0.07)
Basophils Absolute: 0 10*3/uL (ref 0.0–0.1)
Basophils Relative: 1 %
Eosinophils Absolute: 0 10*3/uL (ref 0.0–0.5)
Eosinophils Relative: 1 %
HCT: 42.4 % (ref 36.0–46.0)
Hemoglobin: 13.5 g/dL (ref 12.0–15.0)
Immature Granulocytes: 0 %
Lymphocytes Relative: 30 %
Lymphs Abs: 1.3 10*3/uL (ref 0.7–4.0)
MCH: 27.1 pg (ref 26.0–34.0)
MCHC: 31.8 g/dL (ref 30.0–36.0)
MCV: 85.1 fL (ref 80.0–100.0)
Monocytes Absolute: 0.6 10*3/uL (ref 0.1–1.0)
Monocytes Relative: 14 %
Neutro Abs: 2.3 10*3/uL (ref 1.7–7.7)
Neutrophils Relative %: 54 %
Platelets: 261 10*3/uL (ref 150–400)
RBC: 4.98 MIL/uL (ref 3.87–5.11)
RDW: 13.4 % (ref 11.5–15.5)
WBC: 4.1 10*3/uL (ref 4.0–10.5)
nRBC: 0 % (ref 0.0–0.2)

## 2021-10-17 LAB — BASIC METABOLIC PANEL
Anion gap: 10 (ref 5–15)
BUN: 7 mg/dL — ABNORMAL LOW (ref 8–23)
CO2: 24 mmol/L (ref 22–32)
Calcium: 10.1 mg/dL (ref 8.9–10.3)
Chloride: 102 mmol/L (ref 98–111)
Creatinine, Ser: 0.9 mg/dL (ref 0.44–1.00)
GFR, Estimated: >60 mL/min (ref >60)
Glucose, Bld: 103 mg/dL — ABNORMAL HIGH (ref 70–99)
Potassium: 3.4 mmol/L — ABNORMAL LOW (ref 3.5–5.1)
Sodium: 136 mmol/L (ref 135–145)

## 2021-10-17 LAB — TROPONIN I (HIGH SENSITIVITY): Troponin I (High Sensitivity): 5 ng/L (ref <18)

## 2021-10-17 NOTE — ED Triage Notes (Signed)
Pt BIB [facility] from home, c/o shortness of breath and chest pressure that started last night. Hx HTN and anxiety, states she has been out of her anxiety medications x 3 weeks.

## 2021-10-17 NOTE — ED Provider Notes (Signed)
Emergency Medicine Provider Triage Evaluation Note  Catherine [name] , a 65 y.o. female  was evaluated in triage.  Pt complains of odd sensation to center of her chest.  Began yesterday.  She states she feels "a little" short of breath.  She denies any back pain, radiation of pain to left arm, left jaw.  No associated nausea, vomiting, diaphoresis.  Does states she has history of anxiety has been out of her anxiety medications for 3 weeks.  She has had intermittent sleeping patterns.  Known history of CAD.  Has PND, orthopnea, lower extremity swelling.  He denies any anticoagulation use  Review of Systems  Positive: CP, SOB Negative: Cough, LE edema Physical Exam  BP (!) 143/88 (BP Location: Left Arm)   Pulse 83   Temp 99.3 F (37.4 C) (Oral)   Resp 16   SpO2 96%  Gen:   Awake, no distress   Resp:  Normal effort  MSK:   Moves extremities without difficulty, no LE edema Other:    Medical Decision Making  Medically screening exam initiated at 7:56 PM.  Appropriate orders placed.  Catherine [name] was informed that the remainder of the evaluation will be completed by another provider, this initial triage assessment does not replace that evaluation, and the importance of remaining in the ED until their evaluation is complete.  CP, SOB   Starsky Nanna A, PA-C 10/17/21 1956    Malvin Johns, MD 10/17/21 1958

## 2021-10-18 LAB — TROPONIN I (HIGH SENSITIVITY): Troponin I (High Sensitivity): 7 ng/L (ref <18)

## 2021-10-18 NOTE — ED Notes (Signed)
Called pt x3 for room, no response. 

## 2021-10-18 NOTE — ED Notes (Signed)
Called pt x4 for room, no response. 

## 2021-11-05 ENCOUNTER — Ambulatory Visit: Payer: Medicare Other | Admitting: Gastroenterology

## 2021-11-08 ENCOUNTER — Telehealth: Payer: Self-pay | Admitting: Hematology and Oncology

## 2021-11-08 NOTE — Telephone Encounter (Signed)
Scheduled new patient appointment per 11/16 referral. Patient is aware.

## 2021-11-20 ENCOUNTER — Encounter: Payer: Self-pay | Admitting: Hematology and Oncology

## 2021-11-20 ENCOUNTER — Other Ambulatory Visit: Payer: Self-pay

## 2021-11-20 ENCOUNTER — Inpatient Hospital Stay: Payer: Medicare Other | Attending: Hematology and Oncology

## 2021-11-20 ENCOUNTER — Inpatient Hospital Stay (HOSPITAL_BASED_OUTPATIENT_CLINIC_OR_DEPARTMENT_OTHER): Payer: Medicare Other | Admitting: Hematology and Oncology

## 2021-11-20 VITALS — BP 158/79 | HR 63 | Temp 97.6°F | Resp 18 | Wt 198.6 lb

## 2021-11-20 DIAGNOSIS — D72819 Decreased white blood cell count, unspecified: Secondary | ICD-10-CM | POA: Insufficient documentation

## 2021-11-20 DIAGNOSIS — R42 Dizziness and giddiness: Secondary | ICD-10-CM

## 2021-11-20 DIAGNOSIS — M1990 Unspecified osteoarthritis, unspecified site: Secondary | ICD-10-CM | POA: Insufficient documentation

## 2021-11-20 DIAGNOSIS — I10 Essential (primary) hypertension: Secondary | ICD-10-CM

## 2021-11-20 DIAGNOSIS — J449 Chronic obstructive pulmonary disease, unspecified: Secondary | ICD-10-CM

## 2021-11-20 DIAGNOSIS — D700 Congenital agranulocytosis: Secondary | ICD-10-CM

## 2021-11-20 DIAGNOSIS — E669 Obesity, unspecified: Secondary | ICD-10-CM

## 2021-11-20 LAB — CMP (CANCER CENTER ONLY)
ALT: 21 U/L (ref 0–44)
AST: 26 U/L (ref 15–41)
Albumin: 4.3 g/dL (ref 3.5–5.0)
Alkaline Phosphatase: 115 U/L (ref 38–126)
Anion gap: 5 (ref 5–15)
BUN: 14 mg/dL (ref 8–23)
CO2: 27 mmol/L (ref 22–32)
Calcium: 9.7 mg/dL (ref 8.9–10.3)
Chloride: 104 mmol/L (ref 98–111)
Creatinine: 0.98 mg/dL (ref 0.44–1.00)
GFR, Estimated: >60 mL/min (ref >60)
Glucose, Bld: 100 mg/dL — ABNORMAL HIGH (ref 70–99)
Potassium: 3.7 mmol/L (ref 3.5–5.1)
Sodium: 136 mmol/L (ref 135–145)
Total Bilirubin: 0.6 mg/dL (ref 0.3–1.2)
Total Protein: 8.2 g/dL — ABNORMAL HIGH (ref 6.5–8.1)

## 2021-11-20 LAB — CBC WITH DIFFERENTIAL (CANCER CENTER ONLY)
Abs Immature Granulocytes: 0.02 10*3/uL (ref 0.00–0.07)
Basophils Absolute: 0 10*3/uL (ref 0.0–0.1)
Basophils Relative: 1 %
Eosinophils Absolute: 0.1 10*3/uL (ref 0.0–0.5)
Eosinophils Relative: 2 %
HCT: 39.7 % (ref 36.0–46.0)
Hemoglobin: 13 g/dL (ref 12.0–15.0)
Immature Granulocytes: 0 %
Lymphocytes Relative: 23 %
Lymphs Abs: 1.2 10*3/uL (ref 0.7–4.0)
MCH: 27.8 pg (ref 26.0–34.0)
MCHC: 32.7 g/dL (ref 30.0–36.0)
MCV: 85 fL (ref 80.0–100.0)
Monocytes Absolute: 0.6 10*3/uL (ref 0.1–1.0)
Monocytes Relative: 11 %
Neutro Abs: 3.4 10*3/uL (ref 1.7–7.7)
Neutrophils Relative %: 63 %
Platelet Count: 244 10*3/uL (ref 150–400)
RBC: 4.67 MIL/uL (ref 3.87–5.11)
RDW: 13.5 % (ref 11.5–15.5)
WBC Count: 5.3 10*3/uL (ref 4.0–10.5)
nRBC: 0 % (ref 0.0–0.2)

## 2021-11-20 LAB — VITAMIN B12: Vitamin B-12: 166 pg/mL — ABNORMAL LOW (ref 180–914)

## 2021-11-20 LAB — FOLATE: Folate: 10.4 ng/mL (ref >5.9)

## 2021-11-20 LAB — HEPATITIS B CORE ANTIBODY, TOTAL: Hep B Core Total Ab: NONREACTIVE

## 2021-11-20 LAB — HEPATITIS C ANTIBODY: HCV Ab: NONREACTIVE

## 2021-11-20 LAB — HEPATITIS B SURFACE ANTIBODY,QUALITATIVE: Hep B S Ab: NONREACTIVE

## 2021-11-20 LAB — HIV ANTIBODY (ROUTINE TESTING W REFLEX): HIV Screen 4th Generation wRfx: NONREACTIVE

## 2021-11-20 LAB — HEPATITIS B SURFACE ANTIGEN: Hepatitis B Surface Ag: NONREACTIVE

## 2021-11-20 NOTE — Progress Notes (Signed)
[place] [phone]   Fax:([phone] [place] NOTE  Patient Care Team: Riki Sheer, NP as PCP - General (Nurse Practitioner)  Hematological/Oncological History # Chronic Leukopenia 10/10/2019: WBC 6.3, Hgb 13.3, MCV 87.4, Plt 280 02/13/2020: WBC 3.0, Hgb 14.0, MCV 84.5, Plt 255 06/01/2020: WBC 2.9, Hgb 13.7, Plt 260, MCV 86 10/02/2020: WBC 4.3, Hgb 13.0, MCV 86.7, Plt 268 10/17/2021: WBC 4.1, Hgb 13.5, Plt 261, MCV 85.1  CHIEF COMPLAINTS/PURPOSE OF CONSULTATION:  "Chronic Leukopenia "  HISTORY OF PRESENTING ILLNESS:  Catherine [name] 65 y.o. female with medical history significant for COPD, arthritis, hypertension, obesity, and vertigo who presents for evaluation of a leukopenia.  On review of the previous records Catherine [name] was noted to have a normal white blood cell count of 6.3 on 10/10/2019.  She had a low white blood cell count on 02/13/2020 at 3.0.  She nadired on 06/01/2020 and white blood cell count of 2.9.  Most recently on 10/18/2019 the patient is found to have white blood cell count of 4.1, hemoglobin 13.5, platelets of 261, and MCV 85.  Due to concern for borderline low white blood cell count she was referred to hematology for further evaluation and management.  On exam today Catherine [name] reports that she has had some issues with infections in the past.  She notes that she has a pain in her right side which she thinks may be an infection.  Additionally she has had a bladder infection which occurred several months ago and resolved with antibiotic therapy.  She notes that for her rheumatoid arthritis she does not take anything but prednisone.  She is currently on a 21-day prednisone taper because "this is the only thing I will take".  She notes that she is currently on her second week of the taper.  She does occasionally have episodes with night sweats.  She notes that she does not have any dietary restrictions and eats most everything.   She does enjoy red meat with the occasional hamburger.  She eats vegetables.  On further discussion she notes that she is a never smoker and does not drink any alcohol.  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  Her family history is markable for a brother who is on dialysis.  She notes that her mother and father passed away but she is unsure how.  She has a son and a daughter both of whom are healthy.  She otherwise denies any fevers, chills, nausea, ming, or diarrhea.  She denies any skin rash.  Full 10 point ROS is listed below.  MEDICAL HISTORY:  Past Medical History:  Diagnosis Date   Anxiety    Arthritis    CAD (coronary artery disease)    COPD (chronic obstructive pulmonary disease) (HCC)    Depression    Headache    migraines   Hypertension    Lumbar spinal stenosis    Obesity    Spondylolisthesis at L4-L5 level    Vertigo    Wears glasses     SURGICAL HISTORY: Past Surgical History:  Procedure Laterality Date   CARDIAC CATHETERIZATION     COLONOSCOPY W/ BIOPSIES AND POLYPECTOMY     MULTIPLE TOOTH EXTRACTIONS     TRANSFORAMINAL LUMBAR INTERBODY FUSION (TLIF) WITH PEDICLE SCREW FIXATION 1 LEVEL Left 09/16/2018   Procedure: LEFT-SIDED LUMBAR 4-5 TRANSFORAMINAL LUMBAR INTERBODY FUSION WITH INSTRUMENTATION AND ALLOGRAFT;  Surgeon: Phylliss Bob, MD;  Location: [facility];  Service:  Orthopedics;  Laterality: Left;   TUBAL LIGATION      SOCIAL HISTORY: Social History   Socioeconomic History   Marital status: Single    Spouse name: Not on file   Number of children: 2   Years of education: 9   Highest education level: Not on file  Occupational History   Occupation: Disability    Comment: Heart Disease  Tobacco Use   Smoking status: Never   Smokeless tobacco: Never  Vaping Use   Vaping Use: Never used  Substance and Sexual Activity   Alcohol use: No   Drug use: No   Sexual activity: Yes    Birth control/protection: Surgical  Other  Topics Concern   Not on file  Social History Narrative   Lives with 18 yo son. Mother passed in 2018   Social Determinants of Health   Financial Resource Strain: Not on file  Food Insecurity: Not on file  Transportation Needs: Not on file  Physical Activity: Not on file  Stress: Not on file  Social Connections: Not on file  Intimate Partner Violence: Not on file    FAMILY HISTORY: Family History  Problem Relation Age of Onset   Kidney disease Mother    Seizures Mother    Heart failure Father    Colon cancer Neg Hx    Breast cancer Neg Hx    Pancreatic cancer Neg Hx    Stomach cancer Neg Hx    Esophageal cancer Neg Hx    Liver disease Neg Hx     ALLERGIES:  is allergic to other, azithromycin, and ciprofloxacin.  MEDICATIONS:  Current Outpatient Medications  Medication Sig Dispense Refill   albuterol (PROVENTIL HFA;VENTOLIN HFA) 108 (90 BASE) MCG/ACT inhaler Inhale 1-2 puffs into the lungs every 6 (six) hours as needed for wheezing or shortness of breath.     albuterol (PROVENTIL) (2.5 MG/3ML) 0.083% nebulizer solution Inhale 2.5 mg into the lungs every 4 (four) hours as needed for wheezing or shortness of breath. As needed  3   amLODipine (NORVASC) 10 MG tablet Take 1 tablet (10 mg total) by mouth every morning. 90 tablet 0   aspirin 81 MG chewable tablet Chew 81 mg by mouth daily.     butalbital-acetaminophen-caffeine (FIORICET) 50-325-40 MG tablet Take 1-2 tablets by mouth every other day as needed.     carvedilol (COREG) 6.25 MG tablet TAKE 1 TABLET BY MOUTH TWO  TIMES DAILY WITH A MEAL (Patient taking differently: Take 6.25 mg by mouth 2 (two) times daily with a meal.) 180 tablet 1   Cholecalciferol (VITAMIN D PO) Take 1 tablet by mouth daily.     clonazePAM (KLONOPIN) 0.5 MG tablet clonazepam 0.5 mg tablet  Take 1 tablet twice a day by oral route as needed for 30 days.     diclofenac Sodium (VOLTAREN) 1 % GEL Apply 1 application topically 4 (four) times daily as  needed.     dicyclomine (BENTYL) 10 MG capsule Take 1 capsule (10 mg total) by mouth in the morning and at bedtime. 60 capsule 2   meclizine (ANTIVERT) 25 MG tablet Take 1 tablet by mouth 3 (three) times daily.     naloxone (NARCAN) 4 MG/0.1ML LIQD nasal spray kit 1 spray.     omeprazole (PRILOSEC) 20 MG capsule Take 20 mg by mouth daily. Alternates with Pantoprazole as needed.     oxyCODONE-acetaminophen (PERCOCET/ROXICET) 5-325 MG tablet Take 1 tablet by mouth every 6 (six) hours.     Peppermint Oil (IBGARD)  90 MG CPCR Take 2 capsules by mouth daily. Take 30-90 minutes before meals with water 60 capsule 3   potassium chloride (K-DUR) 10 MEQ tablet Take 1 tablet (10 mEq total) by mouth daily. 90 tablet 0   predniSONE (STERAPRED UNI-PAK 21 TAB) 5 MG (21) TBPK tablet Take by mouth.     rosuvastatin (CRESTOR) 10 MG tablet Take 10 mg by mouth daily.     SYMBICORT 80-4.5 MCG/ACT inhaler Use 2 puffs TWICE DAILY (120/4=30) (Patient taking differently: Inhale 2 puffs into the lungs 2 (two) times daily.) 10.2 g 0   No current facility-administered medications for this visit.    REVIEW OF SYSTEMS:   Constitutional: ( - ) fevers, ( - )  chills , ( - ) night sweats Eyes: ( - ) blurriness of vision, ( - ) double vision, ( - ) watery eyes Ears, nose, mouth, throat, and face: ( - ) mucositis, ( - ) sore throat Respiratory: ( - ) cough, ( - ) dyspnea, ( - ) wheezes Cardiovascular: ( - ) palpitation, ( - ) chest discomfort, ( - ) lower extremity swelling Gastrointestinal:  ( - ) nausea, ( - ) heartburn, ( - ) change in bowel habits Skin: ( - ) abnormal skin rashes Lymphatics: ( - ) new lymphadenopathy, ( - ) easy bruising Neurological: ( - ) numbness, ( - ) tingling, ( - ) new weaknesses Behavioral/Psych: ( - ) mood change, ( - ) new changes  All other systems were reviewed with the patient and are negative.  PHYSICAL EXAMINATION: ECOG PERFORMANCE STATUS: 1 - Symptomatic but completely  ambulatory  Vitals:   11/20/21 0916  BP: (!) 158/79  Pulse: 63  Resp: 18  Temp: 97.6 F (36.4 C)  SpO2: 96%   Filed Weights   11/20/21 0916  Weight: 198 lb 9 oz (90.1 kg)    GENERAL: well appearing elderly African-American female in NAD  SKIN: skin color, texture, turgor are normal, no rashes or significant lesions EYES: conjunctiva are pink and non-injected, sclera clear LUNGS: clear to auscultation and percussion with normal breathing effort HEART: regular rate & rhythm and no murmurs and no lower extremity edema Musculoskeletal: no cyanosis of digits and no clubbing  PSYCH: alert & oriented x 3, fluent speech NEURO: no focal motor/sensory deficits  LABORATORY DATA:  I have reviewed the data as listed CBC Latest Ref Rng & Units 11/20/2021 10/17/2021 10/02/2020  WBC 4.0 - 10.5 K/uL 5.3 4.1 4.3  Hemoglobin 12.0 - 15.0 g/dL 13.0 13.5 13.0  Hematocrit 36.0 - 46.0 % 39.7 42.4 41.6  Platelets 150 - 400 K/uL 244 261 268    CMP Latest Ref Rng & Units 11/20/2021 10/17/2021 10/02/2020  Glucose 70 - 99 mg/dL 100(H) 103(H) 155(H)  BUN 8 - 23 mg/dL 14 7(L) 12  Creatinine 0.44 - 1.00 mg/dL 0.98 0.90 1.02(H)  Sodium 135 - 145 mmol/L 136 136 140  Potassium 3.5 - 5.1 mmol/L 3.7 3.4(L) 3.6  Chloride 98 - 111 mmol/L 104 102 105  CO2 22 - 32 mmol/L $RemoveB'27 24 24  'tqrCAHvA$ Calcium 8.9 - 10.3 mg/dL 9.7 10.1 9.6  Total Protein 6.5 - 8.1 g/dL 8.2(H) - -  Total Bilirubin 0.3 - 1.2 mg/dL 0.6 - -  Alkaline Phos 38 - 126 U/L 115 - -  AST 15 - 41 U/L 26 - -  ALT 0 - 44 U/L 21 - -    RADIOGRAPHIC STUDIES: No results found.  ASSESSMENT & PLAN Catherine [name] 65 y.o.  female with medical history significant for COPD, arthritis, hypertension, obesity, and vertigo who presents for evaluation of a leukopenia.  After review of the labs, review of the records, and discussion with the patient the patients findings are most consistent with a transient leukopenia.  On the last set of blood work the patient was not  found to have leukopenia.  She seems to have a mildly low white blood cell count with no clear neutropenia or lymphocytopenia.  At this time I would recommend full work-up to include nutritional studies as well as viral serologies.  In the event that these are normal we will have the patient return to clinic in approximately 6 months time to reevaluate.  If there are any concerning findings we will have the patient return to clinic sooner.  #Mild Leukopenia -- Findings are most consistent with a transient leukopenia.  We do not have a differential but will review her blood counts today --At this time we will order work-up to include nutritional studies such as vitamin B12 and folate --Additional studies to include hepatitis B, hepatitis C, and HIV --Multiple possible etiologies include suppression of white blood cells from inflammation from arthritis, suppression from prior rheumatoid arthritis medications, or benign ethnic neutropenia --Plan for return to clinic in 6 months time or sooner if there are concerning findings on the above blood work.  Orders Placed This Encounter  Procedures   CBC with Differential ([facility] Only)    Standing Status:   Future    Number of Occurrences:   1    Standing Expiration Date:   11/20/2022   CMP ([facility] only)    Standing Status:   Future    Number of Occurrences:   1    Standing Expiration Date:   11/20/2022   Folate, Serum    Standing Status:   Future    Number of Occurrences:   1    Standing Expiration Date:   11/20/2022   Vitamin B12    Standing Status:   Future    Number of Occurrences:   1    Standing Expiration Date:   11/20/2022   HIV antibody (with reflex)    Standing Status:   Future    Number of Occurrences:   1    Standing Expiration Date:   11/20/2022   Hepatitis C antibody    Standing Status:   Future    Number of Occurrences:   1    Standing Expiration Date:   11/20/2022   Hepatitis B surface antibody    Standing  Status:   Future    Number of Occurrences:   1    Standing Expiration Date:   11/20/2022   Hepatitis B core antibody, total    Standing Status:   Future    Number of Occurrences:   1    Standing Expiration Date:   11/20/2022   Hepatitis B surface antigen    Standing Status:   Future    Number of Occurrences:   1    Standing Expiration Date:   11/20/2022    All questions were answered. The patient knows to call the clinic with any problems, questions or concerns.  A total of more than 60 minutes were spent on this encounter with face-to-face time and non-face-to-face time, including preparing to see the patient, ordering tests and/or medications, counseling the patient and coordination of care as outlined above.   Ledell Peoples, MD Department of Hematology/Oncology [facility] at  [facility] Phone: [phone] Pager: [phone] Email: [email or web address]  11/20/2021 1:35 PM

## 2021-11-22 ENCOUNTER — Telehealth: Payer: Self-pay | Admitting: *Deleted

## 2021-11-22 MED ORDER — VITAMIN B-12 1000 MCG PO TABS: 1000.0000 ug | ORAL_TABLET | Freq: Every day | ORAL | 3 refills | Status: AC

## 2021-11-22 NOTE — Telephone Encounter (Signed)
TCT patient regarding recent lab results.  Spoke with her and advised that her lab had normalized with the exception of a low B12 level. Dr. Lorenso Courier recommends that she take a B12 supplement daily. Advised that I would call this in to her pharmacy. She is agreeable to this and also agreeable to being seen in this clinic in 3 months. Advised of her appt in March 2023.

## 2021-11-22 NOTE — Telephone Encounter (Signed)
-----   Message from Orson Slick, MD sent at 11/21/2021 12:10 PM EST ----- Please let Mrs. Catherine [name] know that her WBC has normalized. Our labs did however show a Vitamin b12 deficiency. Please call in Vitamin b12 1067mcg PO to a pharmacy of her choosing. If she would like to have this f/u with [place] we can see her in 3 months time. If not, recommend she follow this up with her PCP.  ----- Message ----- From: Interface, Lab In [place] Sent: 11/20/2021  10:26 AM EST To: Orson Slick, MD

## 2021-11-26 ENCOUNTER — Telehealth: Payer: Self-pay

## 2021-11-26 NOTE — Telephone Encounter (Signed)
T/C from pt stating she ordered vitamin B12 from [facility] to be delivered today. I advised pt she has a prescription at [facility] for Vitamin B12 to take one tablet PO daily.  Pt with understanding.

## 2021-12-02 ENCOUNTER — Other Ambulatory Visit: Payer: Self-pay | Admitting: Gastroenterology

## 2021-12-09 ENCOUNTER — Telehealth: Payer: Self-pay | Admitting: *Deleted

## 2021-12-09 MED ORDER — LINACLOTIDE 145 MCG PO CAPS: 145.0000 ug | ORAL_CAPSULE | Freq: Every day | ORAL | 3 refills | Status: DC

## 2021-12-09 NOTE — Telephone Encounter (Signed)
Linzess 145 sent to requested pharmacy

## 2022-01-27 ENCOUNTER — Other Ambulatory Visit: Payer: Self-pay

## 2022-01-27 MED ORDER — DICYCLOMINE HCL 10 MG PO CAPS: 10.0000 mg | ORAL_CAPSULE | Freq: Two times a day (BID) | ORAL | 2 refills | Status: DC

## 2022-01-27 NOTE — Telephone Encounter (Signed)
Dicyclomine refilled as pharmacy requested. Up to date on her office visits.

## 2022-02-19 ENCOUNTER — Other Ambulatory Visit: Payer: Self-pay | Admitting: Hematology and Oncology

## 2022-02-19 ENCOUNTER — Inpatient Hospital Stay: Payer: Medicare Other | Admitting: Hematology and Oncology

## 2022-02-19 ENCOUNTER — Inpatient Hospital Stay: Payer: Medicare Other | Attending: Nurse Practitioner

## 2022-02-19 DIAGNOSIS — D700 Congenital agranulocytosis: Secondary | ICD-10-CM

## 2022-05-02 ENCOUNTER — Other Ambulatory Visit: Payer: Self-pay

## 2022-05-02 MED ORDER — LINACLOTIDE 145 MCG PO CAPS: 145.0000 ug | ORAL_CAPSULE | Freq: Every day | ORAL | 0 refills | Status: DC

## 2022-05-02 MED ORDER — DICYCLOMINE HCL 10 MG PO CAPS: 10.0000 mg | ORAL_CAPSULE | Freq: Two times a day (BID) | ORAL | 0 refills | Status: DC

## 2022-06-06 ENCOUNTER — Other Ambulatory Visit: Payer: Self-pay | Admitting: Family Medicine

## 2022-06-06 DIAGNOSIS — Z1231 Encounter for screening mammogram for malignant neoplasm of breast: Secondary | ICD-10-CM

## 2022-06-20 ENCOUNTER — Other Ambulatory Visit: Payer: Self-pay | Admitting: *Deleted

## 2022-06-20 MED ORDER — LINACLOTIDE 145 MCG PO CAPS: 145.0000 ug | ORAL_CAPSULE | Freq: Every day | ORAL | 3 refills | Status: DC

## 2022-06-20 NOTE — Progress Notes (Signed)
Request by fax for pt refills on Linzess 145 mcg

## 2022-07-10 ENCOUNTER — Ambulatory Visit: Payer: Medicare Other

## 2022-07-10 ENCOUNTER — Other Ambulatory Visit: Payer: Self-pay | Admitting: Gastroenterology

## 2022-07-10 NOTE — Telephone Encounter (Signed)
Patient needs to schedule a followup appointment.

## 2022-07-15 ENCOUNTER — Ambulatory Visit
Admission: RE | Admit: 2022-07-15 | Discharge: 2022-07-15 | Disposition: A | Discharge status: Home / Self Care | Payer: Medicare Other | Source: Ambulatory Visit | Attending: Family Medicine | Admitting: Family Medicine

## 2022-07-15 DIAGNOSIS — Z1231 Encounter for screening mammogram for malignant neoplasm of breast: Secondary | ICD-10-CM

## 2022-08-14 ENCOUNTER — Other Ambulatory Visit: Payer: Self-pay

## 2022-08-14 MED ORDER — DICYCLOMINE HCL 10 MG PO CAPS: 10.0000 mg | ORAL_CAPSULE | Freq: Two times a day (BID) | ORAL | 1 refills | Status: DC

## 2022-09-10 ENCOUNTER — Other Ambulatory Visit: Payer: Self-pay

## 2022-09-10 MED ORDER — DICYCLOMINE HCL 10 MG PO CAPS: 10.0000 mg | ORAL_CAPSULE | Freq: Two times a day (BID) | ORAL | 0 refills | Status: DC

## 2022-09-22 ENCOUNTER — Other Ambulatory Visit: Payer: Self-pay | Admitting: Gastroenterology

## 2022-11-26 ENCOUNTER — Telehealth: Payer: Self-pay | Admitting: Gastroenterology

## 2022-11-26 MED ORDER — DICYCLOMINE HCL 10 MG PO CAPS: 10.0000 mg | ORAL_CAPSULE | Freq: Two times a day (BID) | ORAL | 0 refills | Status: DC

## 2022-11-26 NOTE — Telephone Encounter (Signed)
Sent a refill but also made pt a follow up appt for feb

## 2022-11-26 NOTE — Telephone Encounter (Signed)
Patient is calling requesting a refill on Dicyclomine Rx. Please advise

## 2022-12-19 ENCOUNTER — Other Ambulatory Visit: Payer: Self-pay | Admitting: Gastroenterology

## 2022-12-31 ENCOUNTER — Encounter: Payer: Self-pay | Admitting: Cardiovascular Disease

## 2022-12-31 ENCOUNTER — Ambulatory Visit: Payer: Medicare Other | Attending: Cardiovascular Disease | Admitting: Cardiovascular Disease

## 2022-12-31 VITALS — BP 158/88 | HR 54 | Ht 60.0 in | Wt 200.0 lb

## 2022-12-31 DIAGNOSIS — I25119 Atherosclerotic heart disease of native coronary artery with unspecified angina pectoris: Secondary | ICD-10-CM

## 2022-12-31 DIAGNOSIS — R0789 Other chest pain: Secondary | ICD-10-CM

## 2022-12-31 DIAGNOSIS — I10 Essential (primary) hypertension: Secondary | ICD-10-CM

## 2022-12-31 MED ORDER — METOPROLOL TARTRATE 50 MG PO TABS: ORAL_TABLET | ORAL | 0 refills | Status: DC

## 2022-12-31 NOTE — Assessment & Plan Note (Signed)
History of essential hypertension blood pressure measured today at 158/88.  She apparently is on atenolol as an outpatient.

## 2022-12-31 NOTE — Patient Instructions (Signed)
  Testing/Procedures:   Your cardiac CT will be scheduled at   [facility], [place] [address] [phone]   If scheduled at [facility], please arrive at the [facility] and Children's Entrance (Entrance C2) of [facility] 30 minutes prior to test start time. You can use the FREE valet parking offered at entrance C (encouraged to control the heart rate for the test)  Proceed to the [facility] Radiology Department (first floor) to check-in and test prep.  All radiology patients and guests should use entrance C2 at [facility], accessed from [facility], even though the hospital's physical address listed is [address]      Please follow these instructions carefully (unless otherwise directed):    On the Night Before the Test: Be sure to Drink plenty of water. Do not consume any caffeinated/decaffeinated beverages or chocolate 12 hours prior to your test. Do not take any antihistamines 12 hours prior to your test.  On the Day of the Test: Drink plenty of water until 1 hour prior to the test. Do not eat any food 1 hour prior to test. You may take your regular medications prior to the test.  Take metoprolol (Lopressor) 50 mg two hours prior to test. FEMALES- please wear underwire-free bra if available, avoid dresses & tight clothing   After the Test: Drink plenty of water. After receiving IV contrast, you may experience a mild flushed feeling. This is normal. On occasion, you may experience a mild rash up to 24 hours after the test. This is not dangerous. If this occurs, you can take Benadryl 25 mg and increase your fluid intake. If you experience trouble breathing, this can be serious. If it is severe call 911 IMMEDIATELY. If it is mild, please call our office.  We will call to schedule your test 2-4 weeks out understanding that some insurance companies will need an authorization prior to the  service being performed.   For non-scheduling related questions, please contact the cardiac imaging nurse navigator should you have any questions/concerns: Marchia Bond, Cardiac Imaging Nurse Navigator Gordy Clement, Cardiac Imaging Nurse Navigator [facility] Direct Office Dial: [phone]   For scheduling needs, including cancellations and rescheduling, please call [place], [phone].    Follow-Up: At [facility], you and your health needs are our priority.  As part of our continuing mission to provide you with exceptional heart care, we have created designated Provider Care Teams.  These Care Teams include your primary Cardiologist (physician) and Advanced Practice Providers (APPs -  Physician Assistants and Nurse Practitioners) who all work together to provide you with the care you need, when you need it.  We recommend signing up for the patient portal called "MyChart".  Sign up information is provided on this After Visit Summary.  MyChart is used to connect with patients for Virtual Visits (Telemedicine).  Patients are able to view lab/test results, encounter notes, upcoming appointments, etc.  Non-urgent messages can be sent to your provider as well.   To learn more about what you can do with MyChart, go to [email or web address].    Your next appointment:    AFTER CT COMPLETED WITH DR Gwenlyn Found

## 2022-12-31 NOTE — Assessment & Plan Note (Signed)
History of hyperlipidemia on statin therapy followed by her primary cardiologist.

## 2022-12-31 NOTE — Progress Notes (Signed)
12/31/2022 [name]   [birth year]  [id number]  Primary Physician Riki Sheer, NP Primary Cardiologist: Lorretta Harp MD Lupe Carney, [place]  HPI:  Catherine [name] is a 67 y.o. severely overweight divorced African-American female mother of 2, grandmother of 43 grandchildren referred by Dr. Edson Snowball for cardiovascular valuation because of an abnormal Myoview stress test.  She is retired from work at [employer] and [employer].  Her risk factors include treated hypertension and hyperlipidemia.  Her brother did die of a myocardial infarction in his 33s.  She is never had a heart attack or stroke.  She does complain of occasional atypical chest pain with radiation down her left arm.  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   Current Meds  Medication Sig   albuterol (PROVENTIL HFA;VENTOLIN HFA) 108 (90 BASE) MCG/ACT inhaler Inhale 1-2 puffs into the lungs every 6 (six) hours as needed for wheezing or shortness of breath.   albuterol (PROVENTIL) (2.5 MG/3ML) 0.083% nebulizer solution Inhale 2.5 mg into the lungs every 4 (four) hours as needed for wheezing or shortness of breath. As needed   aspirin 81 MG chewable tablet Chew 81 mg by mouth daily.   butalbital-acetaminophen-caffeine (FIORICET) 50-325-40 MG tablet Take 1-2 tablets by mouth every other day as needed.   Cholecalciferol (VITAMIN D PO) Take 1 tablet by mouth daily.   clonazePAM (KLONOPIN) 0.5 MG tablet clonazepam 0.5 mg tablet  Take 1 tablet twice a day by oral route as needed for 30 days.   diclofenac Sodium (VOLTAREN) 1 % GEL Apply 1 application topically 4 (four) times daily as needed.   meclizine (ANTIVERT) 25 MG tablet Take 1 tablet by mouth 2 (two) times daily as needed.   metoprolol tartrate (LOPRESSOR) 50 MG tablet Take 2 hours prior to CT scan   naloxone (NARCAN) 4 MG/0.1ML LIQD nasal spray kit 1 spray.   omeprazole (PRILOSEC) 20 MG  capsule Take 20 mg by mouth daily. Alternates with Pantoprazole as needed.   oxyCODONE-acetaminophen (PERCOCET/ROXICET) 5-325 MG tablet Take 1 tablet by mouth every 6 (six) hours.   Peppermint Oil (IBGARD) 90 MG CPCR Take 2 capsules by mouth daily. Take 30-90 minutes before meals with water   potassium chloride (K-DUR) 10 MEQ tablet Take 1 tablet (10 mEq total) by mouth daily. (Patient taking differently: Take 20 mEq by mouth daily.)   predniSONE (STERAPRED UNI-PAK 21 TAB) 5 MG (21) TBPK tablet Take by mouth.   rosuvastatin (CRESTOR) 10 MG tablet Take 10 mg by mouth daily.   SYMBICORT 80-4.5 MCG/ACT inhaler Use 2 puffs TWICE DAILY (120/4=30) (Patient taking differently: Inhale 2 puffs into the lungs 2 (two) times daily.)   vitamin B-12 (CYANOCOBALAMIN) 1000 MCG tablet Take 1 tablet (1,000 mcg total) by mouth daily.   [DISCONTINUED] amLODipine (NORVASC) 10 MG tablet Take 1 tablet (10 mg total) by mouth every morning.   [DISCONTINUED] carvedilol (COREG) 6.25 MG tablet TAKE 1 TABLET BY MOUTH TWO  TIMES DAILY WITH A MEAL (Patient taking differently: Take 6.25 mg by mouth 2 (two) times daily with a meal.)   [DISCONTINUED] linaclotide (LINZESS) 145 MCG CAPS capsule Take 1 capsule (145 mcg total) by mouth daily before breakfast. Needs a follow up office appointment     Allergies  Allergen Reactions   Other Nausea Only and Other (See Comments)    Darvocet   Azithromycin Nausea And Vomiting   Ciprofloxacin Itching  Social History   Socioeconomic History   Marital status: Single    Spouse name: Not on file   Number of children: 2   Years of education: 9   Highest education level: Not on file  Occupational History   Occupation: Disability    Comment: Heart Disease  Tobacco Use   Smoking status: Never   Smokeless tobacco: Never  Vaping Use   Vaping Use: Never used  Substance and Sexual Activity   Alcohol use: No   Drug use: No   Sexual activity: Yes    Birth control/protection:  Surgical  Other Topics Concern   Not on file  Social History Narrative   Lives with 60 yo son. Mother passed in 2018   Social Determinants of Health   Financial Resource Strain: Low Risk  (03/29/2019)   Overall Financial Resource Strain (CARDIA)    Difficulty of Paying Living Expenses: Not hard at all  Food Insecurity: No Food Insecurity (03/29/2019)   Hunger Vital Sign    Worried About Running Out of Food in the Last Year: Never true    Ran Out of Food in the Last Year: Never true  Transportation Needs: No Transportation Needs (03/29/2019)   PRAPARE - Hydrologist (Medical): No    Lack of Transportation (Non-Medical): No  Physical Activity: Insufficiently Active (03/29/2019)   Exercise Vital Sign    Days of Exercise per Week: 2 days    Minutes of Exercise per Session: 10 min  Stress: Stress Concern Present (03/29/2019)   [place]    Feeling of Stress : To some extent  Social Connections: Moderately Isolated (03/29/2019)   Social Connection and Isolation Panel [NHANES]    Frequency of Communication with Friends and Family: Twice a week    Frequency of Social Gatherings with Friends and Family: Once a week    Attends Religious Services: Never    Marine scientist or Organizations: No    Attends Archivist Meetings: Never    Marital Status: Divorced  Human resources officer Violence: Not At Risk (03/29/2019)   Humiliation, Afraid, Rape, and Kick questionnaire    Fear of Current or Ex-Partner: No    Emotionally Abused: No    Physically Abused: No    Sexually Abused: No     Review of Systems: General: negative for chills, fever, night sweats or weight changes.  Cardiovascular: negative for chest pain, dyspnea on exertion, edema, orthopnea, palpitations, paroxysmal nocturnal dyspnea or shortness of breath Dermatological: negative for rash Respiratory: negative for cough or  wheezing Urologic: negative for hematuria Abdominal: negative for nausea, vomiting, diarrhea, bright red blood per rectum, melena, or hematemesis Neurologic: negative for visual changes, syncope, or dizziness All other systems reviewed and are otherwise negative except as noted above.    Blood pressure (!) 158/88, pulse (!) 54, height 5' (1.524 m), weight 200 lb (90.7 kg).  General appearance: alert and no distress Neck: no adenopathy, no carotid bruit, no JVD, supple, symmetrical, trachea midline, and thyroid not enlarged, symmetric, no tenderness/mass/nodules Lungs: clear to auscultation bilaterally Heart: regular rate and rhythm, S1, S2 normal, no murmur, click, rub or gallop Extremities: extremities normal, atraumatic, no cyanosis or edema Pulses: 2+ and symmetric Skin: Skin color, texture, turgor normal. No rashes or lesions Neurologic: Grossly normal  EKG sinus bradycardia at 54 with LVH voltage and inferolateral T wave inversion possibly related to repolarization versus ischemia.  I personally reviewed this  EKG.  ASSESSMENT AND PLAN:   HYPERLIPIDEMIA History of hyperlipidemia on statin therapy followed by her primary cardiologist.  Essential hypertension History of essential hypertension blood pressure measured today at 158/88.  She apparently is on atenolol as an outpatient.  Coronary atherosclerosis Patient has atypical chest pain with risk factors and recent perfusion study performed by Dr. Edson Snowball suggesting reversible disease in the circumflex and RCA territories.  Based on the patient's body habitus certainly possible this represents artifact.  Prior to recommending an invasive procedure I am going to get a coronary CTA to further evaluate.     Lorretta Harp MD FACP,FACC,FAHA, [facility] 12/31/2022 1:48 PM

## 2022-12-31 NOTE — Addendum Note (Signed)
Addended by: Cristopher Estimable on: 12/31/2022 01:58 PM   Modules accepted: Orders

## 2022-12-31 NOTE — Assessment & Plan Note (Signed)
Patient has atypical chest pain with risk factors and recent perfusion study performed by Dr. Edson Snowball suggesting reversible disease in the circumflex and RCA territories.  Based on the patient's body habitus certainly possible this represents artifact.  Prior to recommending an invasive procedure I am going to get a coronary CTA to further evaluate.

## 2023-01-01 LAB — BASIC METABOLIC PANEL
BUN/Creatinine Ratio: 13 (ref 12–28)
BUN: 18 mg/dL (ref 8–27)
CO2: 25 mmol/L (ref 20–29)
Calcium: 10 mg/dL (ref 8.7–10.3)
Chloride: 102 mmol/L (ref 96–106)
Creatinine, Ser: 1.44 mg/dL — ABNORMAL HIGH (ref 0.57–1.00)
Glucose: 91 mg/dL (ref 70–99)
Potassium: 3.6 mmol/L (ref 3.5–5.2)
Sodium: 141 mmol/L (ref 134–144)
eGFR: 40 mL/min/{1.73_m2} — ABNORMAL LOW (ref >59)

## 2023-01-02 ENCOUNTER — Telehealth (HOSPITAL_COMMUNITY): Payer: Self-pay | Admitting: Emergency Medicine

## 2023-01-02 NOTE — Telephone Encounter (Signed)
Reaching out to patient to offer assistance regarding upcoming cardiac imaging study; pt verbalizes understanding of appt date/time, parking situation and where to check in, pre-test NPO status and medications ordered, and verified current allergies; name and call back number provided for further questions should they arise Catherine Bond RN Navigator Cardiac Imaging [facility] [phone] office [phone] cell  Arrival 1200 WC entrance Daily meds Denies iv issues  Aware contrast / nitro

## 2023-01-05 ENCOUNTER — Ambulatory Visit (HOSPITAL_COMMUNITY)
Admission: RE | Admit: 2023-01-05 | Discharge: 2023-01-05 | Disposition: A | Discharge status: Home / Self Care | Payer: 59 | Source: Ambulatory Visit | Attending: Cardiovascular Disease | Admitting: Cardiovascular Disease

## 2023-01-05 DIAGNOSIS — I2510 Atherosclerotic heart disease of native coronary artery without angina pectoris: Secondary | ICD-10-CM | POA: Diagnosis not present

## 2023-01-05 DIAGNOSIS — R0789 Other chest pain: Secondary | ICD-10-CM | POA: Diagnosis not present

## 2023-01-05 MED ORDER — IOHEXOL 350 MG/ML SOLN
100.0000 mL | Freq: Once | INTRAVENOUS | Status: AC | PRN
  Administered 2023-01-05: 100 mL via INTRAVENOUS

## 2023-01-05 MED ORDER — NITROGLYCERIN 0.4 MG SL SUBL
0.8000 mg | SUBLINGUAL_TABLET | Freq: Once | SUBLINGUAL | Status: AC
  Administered 2023-01-05: 0.8 mg via SUBLINGUAL

## 2023-01-05 MED ORDER — NITROGLYCERIN 0.4 MG SL SUBL
SUBLINGUAL_TABLET | SUBLINGUAL | Status: AC
  Filled 2023-01-05: qty 2

## 2023-01-14 ENCOUNTER — Encounter: Payer: Self-pay | Admitting: Cardiovascular Disease

## 2023-01-14 ENCOUNTER — Ambulatory Visit: Payer: 59 | Attending: Cardiovascular Disease | Admitting: Cardiovascular Disease

## 2023-01-14 VITALS — BP 148/90 | HR 62 | Ht 60.0 in | Wt 200.0 lb

## 2023-01-14 DIAGNOSIS — R0789 Other chest pain: Secondary | ICD-10-CM

## 2023-01-14 NOTE — Patient Instructions (Signed)
Medication Instructions:  Your physician recommends that you continue on your current medications as directed. Please refer to the Current Medication list given to you today.  *If you need a refill on your cardiac medications before your next appointment, please call your pharmacy*   Follow-Up: At [facility], you and your health needs are our priority.  As part of our continuing mission to provide you with exceptional heart care, we have created designated Provider Care Teams.  These Care Teams include your primary Cardiologist (physician) and Advanced Practice Providers (APPs -  Physician Assistants and Nurse Practitioners) who all work together to provide you with the care you need, when you need it.  We recommend signing up for the patient portal called "MyChart".  Sign up information is provided on this After Visit Summary.  MyChart is used to connect with patients for Virtual Visits (Telemedicine).  Patients are able to view lab/test results, encounter notes, upcoming appointments, etc.  Non-urgent messages can be sent to your provider as well.   To learn more about what you can do with MyChart, go to [email or web address].    Your next appointment:   We will see you on an as needed basis.  Provider:   Jonathan Berry, MD  

## 2023-01-14 NOTE — Progress Notes (Signed)
Catherine [name] returns today for discussion of her recent coronary CTA performed 01/05/2023.  She was initially for to me by Dr. Edson Snowball because of atypical chest pain and abnormal Myoview stress test.  Her coronary CTA revealed a coronary calcium score of 135 with nonobstructive CAD.  She has had no chest pain since I saw her on 12/31/2022.  I do not think that she needs cardiac catheterization but rather aggressive risk factor modification.  She will follow-up with Dr. Edson Snowball and I will see her back as needed.  Lorretta Harp, M.D., [place], [facility], [place] [address] [place], [place]  [address]  [phone] 01/14/2023 3:03 PM

## 2023-01-23 ENCOUNTER — Telehealth: Payer: Self-pay | Admitting: Gastroenterology

## 2023-01-23 MED ORDER — DICYCLOMINE HCL 10 MG PO CAPS: 10.0000 mg | ORAL_CAPSULE | Freq: Two times a day (BID) | ORAL | 0 refills | Status: DC

## 2023-01-23 NOTE — Telephone Encounter (Signed)
I sent this refill of Bentyl to [facility] today but patient must keep her scheduled appt on 2/16 to continue refills.

## 2023-01-23 NOTE — Telephone Encounter (Signed)
Patient called to request a Bentyl refill.

## 2023-02-06 ENCOUNTER — Ambulatory Visit: Payer: 59 | Admitting: Gastroenterology

## 2023-04-01 ENCOUNTER — Other Ambulatory Visit: Payer: Self-pay | Admitting: Cardiovascular Disease

## 2023-04-02 ENCOUNTER — Other Ambulatory Visit: Payer: Self-pay | Admitting: *Deleted

## 2023-04-02 MED ORDER — DICYCLOMINE HCL 10 MG PO CAPS: 10.0000 mg | ORAL_CAPSULE | Freq: Two times a day (BID) | ORAL | 0 refills | Status: DC

## 2023-04-02 MED ORDER — LINACLOTIDE 145 MCG PO CAPS: 145.0000 ug | ORAL_CAPSULE | Freq: Every day | ORAL | 3 refills | Status: DC

## 2023-06-08 ENCOUNTER — Encounter: Payer: Self-pay | Admitting: Gastroenterology

## 2023-06-15 ENCOUNTER — Other Ambulatory Visit: Payer: Self-pay | Admitting: Nurse Practitioner

## 2023-06-15 DIAGNOSIS — Z1231 Encounter for screening mammogram for malignant neoplasm of breast: Secondary | ICD-10-CM

## 2023-06-17 ENCOUNTER — Other Ambulatory Visit: Payer: Self-pay | Admitting: Nurse Practitioner

## 2023-06-17 DIAGNOSIS — Z13820 Encounter for screening for osteoporosis: Secondary | ICD-10-CM

## 2023-06-22 ENCOUNTER — Ambulatory Visit (HOSPITAL_COMMUNITY)
Admission: EM | Admit: 2023-06-22 | Discharge: 2023-06-22 | Disposition: A | Discharge status: Home / Self Care | Payer: 59 | Attending: Family Medicine | Admitting: Family Medicine

## 2023-06-22 ENCOUNTER — Encounter (HOSPITAL_COMMUNITY): Payer: Self-pay

## 2023-06-22 DIAGNOSIS — R519 Headache, unspecified: Secondary | ICD-10-CM

## 2023-06-22 DIAGNOSIS — I10 Essential (primary) hypertension: Secondary | ICD-10-CM

## 2023-06-22 MED ORDER — KETOROLAC TROMETHAMINE 30 MG/ML IJ SOLN
INTRAMUSCULAR | Status: AC
  Filled 2023-06-22: qty 1

## 2023-06-22 MED ORDER — DEXAMETHASONE SODIUM PHOSPHATE 10 MG/ML IJ SOLN
10.0000 mg | Freq: Once | INTRAMUSCULAR | Status: AC
  Administered 2023-06-22: 10 mg via INTRAMUSCULAR

## 2023-06-22 MED ORDER — KETOROLAC TROMETHAMINE 30 MG/ML IJ SOLN
15.0000 mg | Freq: Once | INTRAMUSCULAR | Status: AC
  Administered 2023-06-22: 15 mg via INTRAMUSCULAR

## 2023-06-22 MED ORDER — DEXAMETHASONE SODIUM PHOSPHATE 10 MG/ML IJ SOLN
INTRAMUSCULAR | Status: AC
  Filled 2023-06-22: qty 1

## 2023-06-22 NOTE — ED Triage Notes (Signed)
Pt c/o headaches for a week. States hx of chronic headaches and her PCP changed her meds and gave her Butalbital with little relief.

## 2023-06-22 NOTE — Discharge Instructions (Addendum)
Meds ordered this encounter  Medications   ketorolac (TORADOL) 30 MG/ML injection 15 mg   dexamethasone (DECADRON) injection 10 mg   Your blood pressure was noted to be elevated during your visit today. If you are currently taking medication for high blood pressure, please ensure you are taking this as directed. If you do not have a history of high blood pressure and your blood pressure remains persistently elevated, you may need to begin taking a medication at some point. You may return here within the next few days to recheck if unable to see your primary care provider or if you do not have a one.  BP (!) 163/88 (BP Location: Left Arm)   Pulse 61   Temp 98.5 F (36.9 C) (Oral)   Resp 18   SpO2 93%   BP Readings from Last 3 Encounters:  06/22/23 (!) 163/88  01/14/23 (!) 148/90  01/05/23 137/74

## 2023-06-24 NOTE — ED Provider Notes (Signed)
[facility]   [id number] 06/22/23 Arrival Time: 4098  ASSESSMENT & PLAN:  1. Acute intractable headache, unspecified headache type   2. Elevated blood pressure reading with diagnosis of hypertension    Meds ordered this encounter  Medications   ketorolac (TORADOL) 30 MG/ML injection 15 mg   dexamethasone (DECADRON) injection 10 mg   Normal neurological exam. Afebrile without nuchal rigidity. Discussed. Current presentation and symptoms are similar with prior headaches and are not consistent with SAH, ICH, meningitis, or temporal arteritis. No indication for urgent neurodiagnostic imaging. Agrees to ED evaluation should any of her symptoms worsen.  Recommend:  Follow-up Information     [facility].   Specialty: Emergency Medicine Why: If symptoms worsen in any way. Contact information: [address] [id number] [place] [address] [phone]                 Discharge Instructions       Meds ordered this encounter  Medications   ketorolac (TORADOL) 30 MG/ML injection 15 mg   dexamethasone (DECADRON) injection 10 mg   Your blood pressure was noted to be elevated during your visit today. If you are currently taking medication for high blood pressure, please ensure you are taking this as directed. If you do not have a history of high blood pressure and your blood pressure remains persistently elevated, you may need to begin taking a medication at some point. You may return here within the next few days to recheck if unable to see your primary care provider or if you do not have a one.  BP (!) 163/88 (BP Location: Left Arm)   Pulse 61   Temp 98.5 F (36.9 C) (Oral)   Resp 18   SpO2 93%   BP Readings from Last 3 Encounters:  06/22/23 (!) 163/88  01/14/23 (!) 148/90  01/05/23 137/74    Will schedule f/u with PCP to recheck BP.   Reviewed expectations re: course of current medical  issues. Questions answered. Outlined signs and symptoms indicating need for more acute intervention. Patient verbalized understanding. After Visit Summary given.   SUBJECTIVE: History from: Patient. Patient is able to give a clear and coherent history.  Catherine [name] is a 67 y.o. female who presents with complaint of a acute on chronic headache. Onset gradual, this past week; generalized when present. History of headaches with similar symptoms.. Precipitating factors include: none which have been determined. Reports PCP recently changed her medications "a little" but cannot elaborate. Records unavailable Associated symptoms: Preceding aura: no. Nausea/vomiting: no. Vision changes: no. Increased sensitivity to light and to noises: mild. Fever: no. Sinus pressure/congestion: no. Extremity weakness: no. Home treatment has included  Rx Butalbital  with little improvement. Denies dizziness, loss of balance, muscle weakness, numbness of extremities, speech difficulties, and vision problems. No head injury reported. Ambulatory without difficulty. No recent travel.  Increased blood pressure noted today. Reports that she is treated for HTN.  She reports taking medications as instructed, no chest pain on exertion, no dyspnea on exertion, no swelling of ankles, no orthostatic dizziness or lightheadedness, and no orthopnea or paroxysmal nocturnal dyspnea.  OBJECTIVE:  Vitals:   06/22/23 1053  BP: (!) 163/88  Pulse: 61  Resp: 18  Temp: 98.5 F (36.9 C)  TempSrc: Oral  SpO2: 93%    General appearance: alert; NAD HENT: normocephalic; atraumatic Eyes: PERRLA; EOMI; conjunctivae normal Neck: supple with FROM; does report pulling sensation over L neck with  head movement; no swelling Lungs: clear to auscultation bilaterally; unlabored respirations Heart: regular rate and rhythm Extremities: no edema; symmetrical with no gross deformities Skin: warm and dry Neurologic: alert; speech is  fluent and clear without dysarthria or aphasia; CN 2-12 grossly intact; no facial droop; normal gait; normal symmetric reflexes; normal extremity strength and sensation throughout; bilateral upper and lower extremity sensation is grossly intact with 5/5 symmetric strength; normal grip strength; normal plantar and dorsi flexion bilaterally; negative Romberg sign Psychological: alert and cooperative; normal mood and affect   Allergies  Allergen Reactions   Other Nausea Only and Other (See Comments)    Darvocet   Azithromycin Nausea And Vomiting   Ciprofloxacin Itching    Past Medical History:  Diagnosis Date   Anxiety    Arthritis    CAD (coronary artery disease)    COPD (chronic obstructive pulmonary disease) (HCC)    Depression    Headache    migraines   Hypertension    Lumbar spinal stenosis    Obesity    Spondylolisthesis at L4-L5 level    Vertigo    Wears glasses    Social History   Socioeconomic History   Marital status: Single    Spouse name: Not on file   Number of children: 2   Years of education: 9   Highest education level: Not on file  Occupational History   Occupation: Disability    Comment: Heart Disease  Tobacco Use   Smoking status: Never   Smokeless tobacco: Never  Vaping Use   Vaping Use: Never used  Substance and Sexual Activity   Alcohol use: No   Drug use: No   Sexual activity: Yes    Birth control/protection: Surgical  Other Topics Concern   Not on file  Social History Narrative   Lives with 46 yo son. Mother passed in 2018   Social Determinants of Health   Financial Resource Strain: Low Risk  (03/29/2019)   Overall Financial Resource Strain (CARDIA)    Difficulty of Paying Living Expenses: Not hard at all  Food Insecurity: No Food Insecurity (03/29/2019)   Hunger Vital Sign    Worried About Running Out of Food in the Last Year: Never true    Ran Out of Food in the Last Year: Never true  Transportation Needs: No Transportation Needs  (03/29/2019)   PRAPARE - Administrator, Civil Service (Medical): No    Lack of Transportation (Non-Medical): No  Physical Activity: Insufficiently Active (03/29/2019)   Exercise Vital Sign    Days of Exercise per Week: 2 days    Minutes of Exercise per Session: 10 min  Stress: Stress Concern Present (03/29/2019)   [employer] of Occupational Health - Occupational Stress Questionnaire    Feeling of Stress : To some extent  Social Connections: Moderately Isolated (03/29/2019)   Social Connection and Isolation Panel [NHANES]    Frequency of Communication with Friends and Family: Twice a week    Frequency of Social Gatherings with Friends and Family: Once a week    Attends Religious Services: Never    Database administrator or Organizations: No    Attends Banker Meetings: Never    Marital Status: Divorced  Catering manager Violence: Not At Risk (03/29/2019)   Humiliation, Afraid, Rape, and Kick questionnaire    Fear of Current or Ex-Partner: No    Emotionally Abused: No    Physically Abused: No    Sexually Abused: No   Family  History  Problem Relation Age of Onset   Kidney disease Mother    Seizures Mother    Heart failure Father    Colon cancer Neg Hx    Breast cancer Neg Hx    Pancreatic cancer Neg Hx    Stomach cancer Neg Hx    Esophageal cancer Neg Hx    Liver disease Neg Hx    Past Surgical History:  Procedure Laterality Date   BREAST BIOPSY Left 07/2021   BREAST CYST EXCISION Left 02/2020   CARDIAC CATHETERIZATION     COLONOSCOPY W/ BIOPSIES AND POLYPECTOMY     MULTIPLE TOOTH EXTRACTIONS     TRANSFORAMINAL LUMBAR INTERBODY FUSION (TLIF) WITH PEDICLE SCREW FIXATION 1 LEVEL Left 09/16/2018   Procedure: LEFT-SIDED LUMBAR 4-5 TRANSFORAMINAL LUMBAR INTERBODY FUSION WITH INSTRUMENTATION AND ALLOGRAFT;  Surgeon: Estill Bamberg, MD;  Location: [facility] OR;  Service: Orthopedics;  Laterality: Left;   TUBAL LIGATION        Mardella Layman, MD 06/24/23  1235

## 2023-06-26 ENCOUNTER — Other Ambulatory Visit: Payer: Self-pay | Admitting: *Deleted

## 2023-06-26 MED ORDER — LINACLOTIDE 145 MCG PO CAPS: 145.0000 ug | ORAL_CAPSULE | Freq: Every day | ORAL | 3 refills | Status: DC

## 2023-07-01 ENCOUNTER — Ambulatory Visit (AMBULATORY_SURGERY_CENTER): Payer: 59

## 2023-07-01 VITALS — Ht 60.0 in | Wt 198.0 lb

## 2023-07-01 DIAGNOSIS — Z86010 Personal history of colonic polyps: Secondary | ICD-10-CM

## 2023-07-01 MED ORDER — NA SULFATE-K SULFATE-MG SULF 17.5-3.13-1.6 GM/177ML PO SOLN
1.0000 | Freq: Once | ORAL | 0 refills | Status: AC
Start: 2023-07-01 — End: 2023-07-01

## 2023-07-01 NOTE — Progress Notes (Signed)
No egg or soy allergy known to patient  No issues known to pt with past sedation with any surgeries or procedures Patient denies ever being told they had issues or difficulty with intubation  No FH of Malignant Hyperthermia Pt is not on diet pills Pt is not on  home 02  Pt is not on blood thinners  Pt denies issues with constipation  No A fib or A flutter Have any cardiac testing pending--no Pt instructed to use [email or web address] or GoodRx for a price reduction on prep  Can ambulate independently  

## 2023-07-02 ENCOUNTER — Other Ambulatory Visit: Payer: Self-pay

## 2023-07-02 ENCOUNTER — Emergency Department (HOSPITAL_COMMUNITY): Payer: 59

## 2023-07-02 ENCOUNTER — Emergency Department (HOSPITAL_COMMUNITY)
Admission: EM | Admit: 2023-07-02 | Discharge: 2023-07-02 | Disposition: A | Discharge status: Home / Self Care | Payer: 59 | Attending: Emergency Medicine | Admitting: Emergency Medicine

## 2023-07-02 DIAGNOSIS — R001 Bradycardia, unspecified: Secondary | ICD-10-CM | POA: Insufficient documentation

## 2023-07-02 DIAGNOSIS — J449 Chronic obstructive pulmonary disease, unspecified: Secondary | ICD-10-CM | POA: Insufficient documentation

## 2023-07-02 DIAGNOSIS — Z79899 Other long term (current) drug therapy: Secondary | ICD-10-CM | POA: Diagnosis not present

## 2023-07-02 DIAGNOSIS — I2510 Atherosclerotic heart disease of native coronary artery without angina pectoris: Secondary | ICD-10-CM | POA: Insufficient documentation

## 2023-07-02 DIAGNOSIS — R7989 Other specified abnormal findings of blood chemistry: Secondary | ICD-10-CM | POA: Insufficient documentation

## 2023-07-02 DIAGNOSIS — Z7982 Long term (current) use of aspirin: Secondary | ICD-10-CM | POA: Diagnosis not present

## 2023-07-02 DIAGNOSIS — I10 Essential (primary) hypertension: Secondary | ICD-10-CM | POA: Diagnosis not present

## 2023-07-02 DIAGNOSIS — R519 Headache, unspecified: Secondary | ICD-10-CM

## 2023-07-02 LAB — CBC WITH DIFFERENTIAL/PLATELET
Abs Immature Granulocytes: 0.01 10*3/uL (ref 0.00–0.07)
Basophils Absolute: 0 10*3/uL (ref 0.0–0.1)
Basophils Relative: 1 %
Eosinophils Absolute: 0.1 10*3/uL (ref 0.0–0.5)
Eosinophils Relative: 2 %
HCT: 38.8 % (ref 36.0–46.0)
Hemoglobin: 12.4 g/dL (ref 12.0–15.0)
Immature Granulocytes: 0 %
Lymphocytes Relative: 31 %
Lymphs Abs: 1.3 10*3/uL (ref 0.7–4.0)
MCH: 27.4 pg (ref 26.0–34.0)
MCHC: 32 g/dL (ref 30.0–36.0)
MCV: 85.7 fL (ref 80.0–100.0)
Monocytes Absolute: 0.5 10*3/uL (ref 0.1–1.0)
Monocytes Relative: 11 %
Neutro Abs: 2.4 10*3/uL (ref 1.7–7.7)
Neutrophils Relative %: 55 %
Platelets: 215 10*3/uL (ref 150–400)
RBC: 4.53 MIL/uL (ref 3.87–5.11)
RDW: 14.6 % (ref 11.5–15.5)
WBC: 4.3 10*3/uL (ref 4.0–10.5)
nRBC: 0 % (ref 0.0–0.2)

## 2023-07-02 LAB — BASIC METABOLIC PANEL
Anion gap: 15 (ref 5–15)
BUN: 13 mg/dL (ref 8–23)
CO2: 25 mmol/L (ref 22–32)
Calcium: 10.2 mg/dL (ref 8.9–10.3)
Chloride: 101 mmol/L (ref 98–111)
Creatinine, Ser: 1.32 mg/dL — ABNORMAL HIGH (ref 0.44–1.00)
GFR, Estimated: 44 mL/min — ABNORMAL LOW (ref >60)
Glucose, Bld: 116 mg/dL — ABNORMAL HIGH (ref 70–99)
Potassium: 3.6 mmol/L (ref 3.5–5.1)
Sodium: 141 mmol/L (ref 135–145)

## 2023-07-02 LAB — URINALYSIS, ROUTINE W REFLEX MICROSCOPIC
Bacteria, UA: NONE SEEN
Bilirubin Urine: NEGATIVE
Glucose, UA: >=500 mg/dL — AB
Hgb urine dipstick: NEGATIVE
Ketones, ur: NEGATIVE mg/dL
Nitrite: NEGATIVE
Protein, ur: 30 mg/dL — AB
Specific Gravity, Urine: 1.009 (ref 1.005–1.030)
pH: 6 (ref 5.0–8.0)

## 2023-07-02 MED ORDER — DEXAMETHASONE SODIUM PHOSPHATE 10 MG/ML IJ SOLN
10.0000 mg | Freq: Once | INTRAMUSCULAR | Status: AC
  Administered 2023-07-02: 10 mg via INTRAVENOUS
  Filled 2023-07-02: qty 1

## 2023-07-02 MED ORDER — SODIUM CHLORIDE 0.9 % IV BOLUS
1000.0000 mL | Freq: Once | INTRAVENOUS | Status: AC
  Administered 2023-07-02: 1000 mL via INTRAVENOUS

## 2023-07-02 MED ORDER — KETOROLAC TROMETHAMINE 15 MG/ML IJ SOLN
15.0000 mg | Freq: Once | INTRAMUSCULAR | Status: AC
  Administered 2023-07-02: 15 mg via INTRAVENOUS
  Filled 2023-07-02: qty 1

## 2023-07-02 MED ORDER — HYDROXYZINE HCL 25 MG PO TABS
25.0000 mg | ORAL_TABLET | Freq: Once | ORAL | Status: AC
  Administered 2023-07-02: 25 mg via ORAL
  Filled 2023-07-02: qty 1

## 2023-07-02 MED ORDER — AMLODIPINE BESYLATE 10 MG PO TABS: 10.0000 mg | ORAL_TABLET | Freq: Every day | ORAL | 2 refills | Status: DC

## 2023-07-02 MED ORDER — VALSARTAN 160 MG PO TABS: 160.0000 mg | ORAL_TABLET | Freq: Every day | ORAL | 2 refills | Status: DC

## 2023-07-02 NOTE — ED Provider Notes (Signed)
[name] EMERGENCY DEPARTMENT AT [facility] Provider Note   CSN: [id number] Arrival date & time: 07/02/23  1926     History  Chief Complaint  Patient presents with   Headache    Catherine [name] is a 67 y.o. female with past medical history significant for hypertension, CAD, COPD, anxiety, obesity, arthritis, headaches presents to the ED complaining of persistent headache for weeks and elevated blood pressure.  Patient states she has a constant headache that has been worsening and she is unable to sleep at night due to the pain.  She was evaluated at [facility] for her symptoms and followed up with her PCP at [facility].  Patient felt that the medications given at [facility] only briefly relieved her symptoms, but her headache quickly returned.  Patient was switched from valsartan-hydrochlorothiazide and amlodipine to valsartan and atenolol.  Patient states her medications were changed due to feeling unwell when on previously prescribed medications, but she was not sure which medication was making her feel poorly.  Since changing her medications, patient has had increase in her blood pressure and headaches.  Denies       Home Medications Prior to Admission medications   Medication Sig Start Date End Date Taking? Authorizing Provider  valsartan (DIOVAN) 160 MG tablet Take 1 tablet (160 mg total) by mouth daily. 07/02/23  Yes Cobi Aldape R, PA-C  albuterol (PROVENTIL HFA;VENTOLIN HFA) 108 (90 BASE) MCG/ACT inhaler Inhale 1-2 puffs into the lungs every 6 (six) hours as needed for wheezing or shortness of breath.    [provider]  albuterol (PROVENTIL) (2.5 MG/3ML) 0.083% nebulizer solution Inhale 2.5 mg into the lungs every 4 (four) hours as needed for wheezing or shortness of breath. As needed 06/02/18   [provider]  amLODipine (NORVASC) 10 MG tablet Take 1 tablet (10 mg total) by mouth daily. 07/02/23   Melton Alar R, PA-C  aspirin 81 MG chewable tablet Chew 81 mg by  mouth daily.    [provider]  atenolol (TENORMIN) 50 MG tablet Take 50 mg by mouth daily. 05/08/23   [provider]  butalbital-acetaminophen-caffeine (FIORICET) 50-325-40 MG tablet Take 1-2 tablets by mouth every other day as needed. 01/15/21   [provider]  Cholecalciferol (VITAMIN D PO) Take 1 tablet by mouth daily.    [provider]  clonazePAM (KLONOPIN) 0.5 MG tablet clonazepam 0.5 mg tablet  Take 1 tablet twice a day by oral route as needed for 30 days.    [provider]  diclofenac Sodium (VOLTAREN) 1 % GEL Apply 1 application topically 4 (four) times daily as needed. 09/24/20   [provider]  dicyclomine (BENTYL) 10 MG capsule Take 1 capsule (10 mg total) by mouth in the morning and at bedtime. 04/02/23 05/02/23  Napoleon Form, MD  FARXIGA 5 MG TABS tablet Take 5 mg by mouth daily. 06/16/23   [provider]  linaclotide Karlene Einstein) 145 MCG CAPS capsule Take 1 capsule (145 mcg total) by mouth daily before breakfast. Patient not taking: Reported on 07/01/2023 06/26/23   Napoleon Form, MD  meclizine (ANTIVERT) 25 MG tablet Take 1 tablet by mouth 2 (two) times daily as needed.    [provider]  metoprolol tartrate (LOPRESSOR) 50 MG tablet Take 2 hours prior to CT scan Patient not taking: Reported on 07/01/2023 12/31/22   Runell Gess, MD  naloxone [facility]) 4 MG/0.1ML LIQD nasal spray kit 1 spray.    [provider]  omeprazole (PRILOSEC) 20 MG capsule Take 20 mg by mouth daily. Alternates with Pantoprazole as needed.    [provider]  oxyCODONE-acetaminophen (PERCOCET/ROXICET) 5-325 MG tablet Take 1 tablet by mouth every 6 (six) hours. Patient not taking: Reported on 07/01/2023    [provider]  Peppermint Oil (IBGARD) 90 MG CPCR Take 2 capsules by mouth daily. Take 30-90 minutes before meals with water 01/02/20   Napoleon Form, MD  potassium chloride (K-DUR) 10 MEQ  tablet Take 1 tablet (10 mEq total) by mouth daily. Patient taking differently: Take 20 mEq by mouth daily. 01/20/17   Veryl Speak, FNP  rosuvastatin (CRESTOR) 10 MG tablet Take 10 mg by mouth daily.    [provider]  SYMBICORT 80-4.5 MCG/ACT inhaler Use 2 puffs TWICE DAILY (120/4=30) Patient taking differently: Inhale 2 puffs into the lungs 2 (two) times daily. 01/20/17   Veryl Speak, FNP  vitamin B-12 (CYANOCOBALAMIN) 1000 MCG tablet Take 1 tablet (1,000 mcg total) by mouth daily. 11/22/21   Jaci Standard, MD      Allergies    Other, Azithromycin, and Ciprofloxacin    Review of Systems   Review of Systems  Eyes:  Positive for visual disturbance (blurry vision).  Gastrointestinal:  Negative for nausea and vomiting.  Neurological:  Positive for headaches. Negative for dizziness, syncope, facial asymmetry, speech difficulty, weakness, light-headedness and numbness.    Physical Exam Updated Vital Signs BP (!) 164/72 (BP Location: Right Arm)   Pulse (!) 55   Temp 98.7 F (37.1 C) (Oral)   Resp 18   Ht 5\' 6"  (1.676 m)   Wt 89.8 kg   SpO2 97%   BMI 31.96 kg/m  Physical Exam  ED Results / Procedures / Treatments   Labs (all labs ordered are listed, but only abnormal results are displayed) Labs Reviewed  BASIC METABOLIC PANEL - Abnormal; Notable for the following components:      Result Value   Glucose, Bld 116 (*)    Creatinine, Ser 1.32 (*)    GFR, Estimated 44 (*)    All other components within normal limits  URINALYSIS, ROUTINE W REFLEX MICROSCOPIC - Abnormal; Notable for the following components:   Glucose, UA >=500 (*)    Protein, ur 30 (*)    Leukocytes,Ua SMALL (*)    All other components within normal limits  CBC WITH DIFFERENTIAL/PLATELET    EKG None  Radiology CT Head Wo Contrast  Result Date: 07/02/2023 CLINICAL DATA:  Persistent headache for several weeks. EXAM: CT HEAD WITHOUT CONTRAST TECHNIQUE: Contiguous axial images were  obtained from the base of the skull through the vertex without intravenous contrast. RADIATION DOSE REDUCTION: This exam was performed according to the departmental dose-optimization program which includes automated exposure control, adjustment of the mA and/or kV according to patient size and/or use of iterative reconstruction technique. COMPARISON:  May 22, 2007 FINDINGS: Brain: There is mild cerebral atrophy with widening of the extra-axial spaces and ventricular dilatation. There are areas of decreased attenuation within the white matter tracts of the supratentorial brain, consistent with microvascular disease changes. Mild, stable right basal ganglia calcification is noted. Small, bilateral chronic basal ganglia lacunar infarcts are also seen. Vascular: No hyperdense vessel or unexpected calcification. Skull: Normal. Negative for fracture or focal lesion. Sinuses/Orbits: No acute finding. Other: Innumerable punctate calcifications are seen within the bilateral parotid glands. IMPRESSION: 1. Generalized cerebral atrophy with mild, chronic white matter small vessel ischemic changes. 2. No acute intracranial  abnormality. 3. Small, bilateral chronic basal ganglia lacunar infarcts. Electronically Signed   By: Aram Candela M.D.   On: 07/02/2023 21:19    Procedures Procedures    Medications Ordered in ED Medications  sodium chloride 0.9 % bolus 1,000 mL (1,000 mLs Intravenous New Bag/Given 07/02/23 2204)  ketorolac (TORADOL) 15 MG/ML injection 15 mg (15 mg Intravenous Given 07/02/23 2203)  hydrOXYzine (ATARAX) tablet 25 mg (25 mg Oral Given 07/02/23 2204)  dexamethasone (DECADRON) injection 10 mg (10 mg Intravenous Given 07/02/23 2213)    ED Course/ Medical Decision Making/ A&P                             Medical Decision Making  This patient presents to the ED with chief complaint(s) of headache, hypertension with pertinent past medical history of migraines, headaches, hypertension, CAD.  The  complaint involves an extensive differential diagnosis and also carries with it a high risk of complications and morbidity.    The differential diagnosis includes hypertensive urgency, hypertensive emergency, CVA, tension headache, migraine headache, intracranial hemorrhage   The initial plan is to obtain baseline labs and CT head  Additional history obtained: Records reviewed  - patient was seen at [facility] for same symptoms and given decadron and toradol for her headache.  Initial Assessment:   Exam significant for overall well appearing patient who is not in acute distress.  Heart rate is bradycardic around 50 with regular rhythm.  Pulmonary effort normal.  Neuro exam is reassuring.  CN 2-12 grossly intact.  5/5 strength in BUE and BLE with sensation intact.    Independent ECG/labs interpretation:  The following labs were independently interpreted:  CBC without leukocytosis or anemia.  Metabolic panel with elevated Cr at 1.32 and decreased GFR of 44.  Appears better than 6 months prior.  UA with glucosuria and proteinuria.  No infection.   Independent visualization and interpretation of imaging: I independently visualized the following imaging with scope of interpretation limited to determining acute life threatening conditions related to emergency care: CT head, which revealed no evidence of acute intracranial abnormality.  There is generalized cerebral atrophy with chronic white matter small vessel ischemic changes   Treatment and Reassessment: Patient given IV fluids, decadron and PO hydroxyzine for headache treatment.  Toradol was considered, however, due to patient's kidney function, will hold off.    Disposition:   Discussed with patient in length about blood pressure medication.  Patient feels she was better on valsartan and amlodipine. Prescriptions sent to the pharmacy with 2 refills (90 day supply).  Patient interested in new PCP, will provider her with [facility] phone number and  information for the [facility] and [facility].    Patient care assumed by Dr. Deretha Emory at end of shift.  Plan at end of shift is to follow up on patient's headache symptoms after treatment and continue to monitor blood pressure.  Patient appropriate for discharge home with improvement of symptoms.  This was discussed with patient prior to care transfer.           Final Clinical Impression(s) / ED Diagnoses Final diagnoses:  Bad headache  Uncontrolled hypertension    Rx / DC Orders ED Discharge Orders          Ordered    amLODipine (NORVASC) 10 MG tablet  Daily        07/02/23 2217    valsartan (DIOVAN) 160 MG tablet  Daily  07/02/23 2217              Melton Alar R, PA-C 07/02/23 2219    Vanetta Mulders, MD 07/02/23 2252

## 2023-07-02 NOTE — ED Triage Notes (Signed)
Patient reports persistent headache for several weeks , denies head injury , she adds elevated blood pressure uncontrolled by her antihypertensive medications.

## 2023-07-02 NOTE — Discharge Instructions (Addendum)
Thank you for allowing [place] to be a part of your care today.  You were evaluated in the ED for hypertension and headache.    Your blood work showed decrease in your kidney function and I recommend having this rechecked when you schedule a follow up appointment with your primary care doctor.  Your CT scan did not show evidence of a new stroke, however, it did show chronic, stable, findings as listed below.  IMPRESSION:  1. Generalized cerebral atrophy with mild, chronic white matter  small vessel ischemic changes.  2. No acute intracranial abnormality.  3. Small, bilateral chronic basal ganglia lacunar infarcts.   I have sent over prescriptions for valsartan and amlodipine.  Continue to take your 160 mg valsartan daily and start taking 10 mg amlodipine daily as well.  Stop taking the atenolol.    Please schedule a follow up appointment with primary care.  I have provided the [facility] phone number for you to call to set up an appointment with a new provider.  If it will be some time before you are able to be seen, you may go to the [facility] and [facility] or see your previous doctor.   Monitor your blood pressure at home with the form provided.   Return to the ED if you develop sudden worsening of your symptoms or if you have any new concerns.

## 2023-07-15 ENCOUNTER — Telehealth: Payer: Self-pay | Admitting: *Deleted

## 2023-07-15 MED ORDER — DICYCLOMINE HCL 10 MG PO CAPS: 10.0000 mg | ORAL_CAPSULE | Freq: Two times a day (BID) | ORAL | 1 refills | Status: DC

## 2023-07-15 NOTE — Telephone Encounter (Signed)
Please advise patient to use this only sparingly as needed for severe abdominal cramping and avoid taking it on regular basis.  Please schedule next available appointment.  Thank you

## 2023-07-15 NOTE — Telephone Encounter (Signed)
Pharmacy sent fax for refill request for Dicyclomine   Sent patient in refills today    FYI Dr Lavon Paganini I sent refills of Dicyclomine in for this patient but didn't notice it had been discontinued on her med list until after I sent it. It is ok that she have it?

## 2023-07-17 ENCOUNTER — Ambulatory Visit
Admission: RE | Admit: 2023-07-17 | Discharge: 2023-07-17 | Disposition: A | Discharge status: Home / Self Care | Payer: 59 | Source: Ambulatory Visit | Attending: Nurse Practitioner | Admitting: Nurse Practitioner

## 2023-07-17 DIAGNOSIS — Z1231 Encounter for screening mammogram for malignant neoplasm of breast: Secondary | ICD-10-CM

## 2023-07-20 NOTE — Telephone Encounter (Signed)
I have scheduled the patient for a follow up for Dicyclomine refills on 11/25/2023 at 10:40 am   Patient has a colonoscopy scheduled on 09/21/2023

## 2023-07-22 ENCOUNTER — Encounter: Payer: 59 | Admitting: Gastroenterology

## 2023-09-15 ENCOUNTER — Encounter: Payer: Self-pay | Admitting: Gastroenterology

## 2023-09-21 ENCOUNTER — Encounter: Payer: 59 | Admitting: Gastroenterology

## 2023-09-23 ENCOUNTER — Other Ambulatory Visit: Payer: Self-pay | Admitting: Internal Medicine

## 2023-09-23 DIAGNOSIS — N1831 Chronic kidney disease, stage 3a: Secondary | ICD-10-CM

## 2023-09-29 ENCOUNTER — Ambulatory Visit
Admission: RE | Admit: 2023-09-29 | Discharge: 2023-09-29 | Disposition: A | Discharge status: Home / Self Care | Payer: 59 | Source: Ambulatory Visit | Attending: Internal Medicine | Admitting: Internal Medicine

## 2023-09-29 DIAGNOSIS — N1831 Chronic kidney disease, stage 3a: Secondary | ICD-10-CM

## 2023-10-05 ENCOUNTER — Ambulatory Visit (AMBULATORY_SURGERY_CENTER): Payer: 59 | Admitting: *Deleted

## 2023-10-05 VITALS — Wt 190.0 lb

## 2023-10-05 DIAGNOSIS — Z860100 Personal history of colon polyps, unspecified: Secondary | ICD-10-CM

## 2023-10-05 NOTE — Progress Notes (Signed)
Pre visit completed over the telephone. Instructions mailed to patient home. Patient had SUPREP from a previously canceled colonoscopy.     No egg or soy allergy known to patient  No issues known to pt with past sedation with any surgeries or procedures Patient denies ever being told they had issues or difficulty with intubation  No FH of Malignant Hyperthermia Pt is not on diet pills Pt is not on  home 02  Pt is not on blood thinners  Pt denies issues with constipation  No A fib or A flutter Have any cardiac testing pending--no Pt instructed to use Singlecare.com or GoodRx for a price reduction on prep

## 2023-10-19 ENCOUNTER — Encounter: Payer: 59 | Admitting: Gastroenterology

## 2023-11-11 ENCOUNTER — Encounter: Payer: 59 | Admitting: Gastroenterology

## 2023-11-25 ENCOUNTER — Ambulatory Visit: Payer: 59 | Admitting: Gastroenterology

## 2023-12-05 ENCOUNTER — Encounter: Payer: Self-pay | Admitting: Certified Registered Nurse Anesthetist

## 2023-12-07 ENCOUNTER — Ambulatory Visit: Payer: 59 | Admitting: Gastroenterology

## 2023-12-07 ENCOUNTER — Encounter: Payer: Self-pay | Admitting: Gastroenterology

## 2023-12-07 VITALS — BP 141/71 | HR 51 | Temp 97.4°F | Ht 60.0 in | Wt 190.0 lb

## 2023-12-07 DIAGNOSIS — Z860100 Personal history of colon polyps, unspecified: Secondary | ICD-10-CM

## 2023-12-07 MED ORDER — SODIUM CHLORIDE 0.9 % IV SOLN: 500.0000 mL | Freq: Once | INTRAVENOUS | Status: DC

## 2023-12-07 NOTE — Progress Notes (Signed)
Pt ate yesterday and stools are not clear.  Per Dr.Nandigam, pt will need to be rescheduled.  Rescheduled for tomorrow morning 12/17 at 8:30.  She will stay on clears and do plenvu prep tonight.  Instructions reviewed with patient.

## 2023-12-07 NOTE — Progress Notes (Unsigned)
Pt reports eating solid food on 12/06/2023  at 1730- Liver pudding, 1/2 hamburger, fries, 1/2 pepsi. Informed Dr. Lavon Paganini. Per Dr. Lavon Paganini to reschedule , due to poor prep, pt for colonoscopy on 12/08/2023.  Pt verbalizes understanding.

## 2023-12-08 ENCOUNTER — Encounter: Payer: Self-pay | Admitting: Gastroenterology

## 2023-12-08 ENCOUNTER — Ambulatory Visit: Payer: 59 | Admitting: Gastroenterology

## 2023-12-08 VITALS — BP 132/72 | HR 58 | Temp 97.7°F | Resp 16 | Ht 60.0 in | Wt 198.0 lb

## 2023-12-08 DIAGNOSIS — K648 Other hemorrhoids: Secondary | ICD-10-CM | POA: Diagnosis not present

## 2023-12-08 DIAGNOSIS — Z860100 Personal history of colon polyps, unspecified: Secondary | ICD-10-CM

## 2023-12-08 DIAGNOSIS — K5730 Diverticulosis of large intestine without perforation or abscess without bleeding: Secondary | ICD-10-CM

## 2023-12-08 DIAGNOSIS — Z860101 Personal history of adenomatous and serrated colon polyps: Secondary | ICD-10-CM

## 2023-12-08 DIAGNOSIS — K644 Residual hemorrhoidal skin tags: Secondary | ICD-10-CM

## 2023-12-08 DIAGNOSIS — Z1211 Encounter for screening for malignant neoplasm of colon: Secondary | ICD-10-CM | POA: Diagnosis not present

## 2023-12-08 MED ORDER — HYDROCORTISONE (PERIANAL) 2.5 % EX CREA: 1.0000 | TOPICAL_CREAM | Freq: Two times a day (BID) | CUTANEOUS | 1 refills | Status: DC | PRN

## 2023-12-08 MED ORDER — SODIUM CHLORIDE 0.9 % IV SOLN: 500.0000 mL | Freq: Once | INTRAVENOUS | Status: DC

## 2023-12-08 NOTE — Op Note (Addendum)
[facility] Patient Name: Catherine [name] Procedure Date: 12/08/2023 8:37 AM MRN: [id number] Endoscopist: Napoleon Form , MD, [id number] Age: 67 Referring MD:  Date of Birth: 05-28-1956 Gender: Female Account #: [id number] Procedure:                Colonoscopy Indications:              High risk colon cancer surveillance: Personal                            history of colonic polyps Medicines:                Monitored Anesthesia Care Procedure:                After obtaining informed consent, the colonoscope                            was passed under direct vision. Throughout the                            procedure, the patient's blood pressure, pulse, and                            oxygen saturations were monitored continuously. The                            Olympus Scope [id number] was introduced through the                            anus and advanced to the the cecum, identified by                            appendiceal orifice and ileocecal valve. The                            colonoscopy was performed without difficulty. The                            patient tolerated the procedure well. The quality                            of the bowel preparation was adequate to identify                            polyps greater than 5 mm in size. The ileocecal                            valve, appendiceal orifice, and rectum were                            photographed. Scope In: 8:50:30 AM Scope Out: 9:03:53 AM Scope Withdrawal Time: 0 hours 10 minutes 52 seconds  Total Procedure Duration: 0 hours 13 minutes 23 seconds  Findings:                 The perianal and digital rectal examinations were  normal.                           A few small-mouthed diverticula were found in the                            sigmoid colon.                           Non-bleeding external and internal hemorrhoids were                            found during  retroflexion. The hemorrhoids were                            medium-sized. Complications:            No immediate complications. Estimated Blood Loss:     Estimated blood loss was minimal. Impression:               - Diverticulosis in the sigmoid colon.                           - Non-bleeding external and internal hemorrhoids.                           - No specimens collected. Recommendation:           - Patient has a contact number available for                            emergencies. The signs and symptoms of potential                            delayed complications were discussed with the                            patient. Return to normal activities tomorrow.                            Written discharge instructions were provided to the                            patient.                           - Resume previous diet.                           - Continue present medications.                           - Repeat colonoscopy in 5 years for surveillance.                           - Return to GI clinic at the next available  appointment for management of hemorrhoids.                           - Preparation H ointment: Apply externally BID PRN. Napoleon Form, MD 12/08/2023 9:16:22 AM This report has been signed electronically.

## 2023-12-08 NOTE — Progress Notes (Unsigned)
[facility] History and Physical   Primary Care Physician:  Courtney Paris, NP   Reason for Procedure:  History of adenomatous colon polyps  Plan:    Surveillance colonoscopy with possible interventions as needed     HPI: Catherine [name] is a very pleasant 67 y.o. female here for surveillance colonoscopy. Denies any nausea, vomiting, abdominal pain, melena or bright red blood per rectum  The risks and benefits as well as alternatives of endoscopic procedure(s) have been discussed and reviewed. All questions answered. The patient agrees to proceed.    Past Medical History:  Diagnosis Date   Anxiety    Arthritis    CAD (coronary artery disease)    COPD (chronic obstructive pulmonary disease) (HCC)    Depression    Headache    migraines   Hypertension    Lumbar spinal stenosis    Obesity    Spondylolisthesis at L4-L5 level    Vertigo    Wears glasses     Past Surgical History:  Procedure Laterality Date   BREAST BIOPSY Left 07/2021   BREAST CYST EXCISION Left 02/2020   CARDIAC CATHETERIZATION     COLONOSCOPY W/ BIOPSIES AND POLYPECTOMY     MULTIPLE TOOTH EXTRACTIONS     TRANSFORAMINAL LUMBAR INTERBODY FUSION (TLIF) WITH PEDICLE SCREW FIXATION 1 LEVEL Left 09/16/2018   Procedure: LEFT-SIDED LUMBAR 4-5 TRANSFORAMINAL LUMBAR INTERBODY FUSION WITH INSTRUMENTATION AND ALLOGRAFT;  Surgeon: Estill Bamberg, MD;  Location: [facility] OR;  Service: Orthopedics;  Laterality: Left;   TUBAL LIGATION      Prior to Admission medications   Medication Sig Start Date End Date Taking? Authorizing Provider  albuterol (PROVENTIL HFA;VENTOLIN HFA) 108 (90 BASE) MCG/ACT inhaler Inhale 1-2 puffs into the lungs every 6 (six) hours as needed for wheezing or shortness of breath.   Yes [provider]  amLODipine-olmesartan (AZOR) 10-40 MG tablet Take 1 tablet by mouth daily. 10/02/23  Yes [provider]  atenolol (TENORMIN) 50 MG tablet Take 50 mg by mouth daily. 05/08/23   Yes [provider]  Butalbital-Acetaminophen 50-300 MG TABS SMARTSIG:1 Tablet(s) By Mouth Every 8-12 Hours PRN 07/31/23  Yes [provider]  Cholecalciferol (VITAMIN D PO) Take 1 tablet by mouth daily.   Yes [provider]  clonazePAM (KLONOPIN) 0.5 MG tablet clonazepam 0.5 mg tablet  Take 1 tablet twice a day by oral route as needed for 30 days.   Yes [provider]  meclizine (ANTIVERT) 25 MG tablet Take 1 tablet by mouth 2 (two) times daily as needed.   Yes [provider]  omeprazole (PRILOSEC) 20 MG capsule Take 20 mg by mouth daily. Alternates with Pantoprazole as needed.   Yes [provider]  oxyCODONE-acetaminophen (PERCOCET) 10-325 MG tablet Take 1 tablet by mouth 2 (two) times daily as needed. 09/24/23  Yes [provider]  potassium chloride (K-DUR) 10 MEQ tablet Take 1 tablet (10 mEq total) by mouth daily. Patient taking differently: Take 20 mEq by mouth daily. 01/20/17  Yes Veryl Speak, FNP  rosuvastatin (CRESTOR) 20 MG tablet Take 20 mg by mouth at bedtime. 09/23/23  Yes [provider]  SYMBICORT 80-4.5 MCG/ACT inhaler Use 2 puffs TWICE DAILY (120/4=30) Patient taking differently: Inhale 2 puffs into the lungs 2 (two) times daily. 01/20/17  Yes Veryl Speak, FNP  albuterol (PROVENTIL) (2.5 MG/3ML) 0.083% nebulizer solution Inhale 2.5 mg into the lungs every 4 (four) hours as needed for wheezing or shortness of breath. As needed 06/02/18  [provider]  aspirin 81 MG chewable tablet Chew 81 mg by mouth daily. Patient not taking: Reported on 10/05/2023    [provider]  Butalbital-APAP-Caffeine 50-300-40 MG CAPS Take 1 capsule by mouth 2 (two) times daily as needed. 08/18/23   [provider]  diclofenac Sodium (VOLTAREN) 1 % GEL Apply 1 application topically 4 (four) times daily as needed. Patient not taking: Reported on 10/05/2023 09/24/20   [provider]   dicyclomine (BENTYL) 10 MG capsule Take 1 capsule (10 mg total) by mouth in the morning and at bedtime. 07/15/23 10/05/23  Napoleon Form, MD  FARXIGA 5 MG TABS tablet Take 5 mg by mouth daily. Patient not taking: Reported on 10/05/2023 06/16/23   [provider]  linaclotide Karlene Einstein) 145 MCG CAPS capsule Take 1 capsule (145 mcg total) by mouth daily before breakfast. Patient not taking: Reported on 07/01/2023 06/26/23   Napoleon Form, MD  naloxone [facility]) 4 MG/0.1ML LIQD nasal spray kit 1 spray.    [provider]  Peppermint Oil (IBGARD) 90 MG CPCR Take 2 capsules by mouth daily. Take 30-90 minutes before meals with water 01/02/20   Napoleon Form, MD  topiramate (TOPAMAX) 100 MG tablet Take 100 mg by mouth at bedtime. 09/21/23   [provider]  topiramate (TOPAMAX) 50 MG tablet Take 50 mg by mouth daily. 10/05/23   [provider]  valsartan (DIOVAN) 160 MG tablet Take 1 tablet (160 mg total) by mouth daily. Patient not taking: Reported on 10/05/2023 07/02/23   Melton Alar R, PA-C  vitamin B-12 (CYANOCOBALAMIN) 1000 MCG tablet Take 1 tablet (1,000 mcg total) by mouth daily. 11/22/21   Jaci Standard, MD    Current Outpatient Medications  Medication Sig Dispense Refill   albuterol (PROVENTIL HFA;VENTOLIN HFA) 108 (90 BASE) MCG/ACT inhaler Inhale 1-2 puffs into the lungs every 6 (six) hours as needed for wheezing or shortness of breath.     amLODipine-olmesartan (AZOR) 10-40 MG tablet Take 1 tablet by mouth daily.     atenolol (TENORMIN) 50 MG tablet Take 50 mg by mouth daily.     Butalbital-Acetaminophen 50-300 MG TABS SMARTSIG:1 Tablet(s) By Mouth Every 8-12 Hours PRN     Cholecalciferol (VITAMIN D PO) Take 1 tablet by mouth daily.     clonazePAM (KLONOPIN) 0.5 MG tablet clonazepam 0.5 mg tablet  Take 1 tablet twice a day by oral route as needed for 30 days.     meclizine (ANTIVERT) 25 MG tablet Take 1 tablet by mouth 2 (two) times daily  as needed.     omeprazole (PRILOSEC) 20 MG capsule Take 20 mg by mouth daily. Alternates with Pantoprazole as needed.     oxyCODONE-acetaminophen (PERCOCET) 10-325 MG tablet Take 1 tablet by mouth 2 (two) times daily as needed.     potassium chloride (K-DUR) 10 MEQ tablet Take 1 tablet (10 mEq total) by mouth daily. (Patient taking differently: Take 20 mEq by mouth daily.) 90 tablet 0   rosuvastatin (CRESTOR) 20 MG tablet Take 20 mg by mouth at bedtime.     SYMBICORT 80-4.5 MCG/ACT inhaler Use 2 puffs TWICE DAILY (120/4=30) (Patient taking differently: Inhale 2 puffs into the lungs 2 (two) times daily.) 10.2 g 0   albuterol (PROVENTIL) (2.5 MG/3ML) 0.083% nebulizer solution Inhale 2.5 mg into the lungs every 4 (four) hours as needed for wheezing or shortness of breath. As needed  3   aspirin 81 MG chewable tablet Chew 81 mg by  mouth daily. (Patient not taking: Reported on 10/05/2023)     Butalbital-APAP-Caffeine 50-300-40 MG CAPS Take 1 capsule by mouth 2 (two) times daily as needed.     diclofenac Sodium (VOLTAREN) 1 % GEL Apply 1 application topically 4 (four) times daily as needed. (Patient not taking: Reported on 10/05/2023)     dicyclomine (BENTYL) 10 MG capsule Take 1 capsule (10 mg total) by mouth in the morning and at bedtime. 60 capsule 1   FARXIGA 5 MG TABS tablet Take 5 mg by mouth daily. (Patient not taking: Reported on 10/05/2023)     linaclotide (LINZESS) 145 MCG CAPS capsule Take 1 capsule (145 mcg total) by mouth daily before breakfast. (Patient not taking: Reported on 07/01/2023) 30 capsule 3   naloxone (NARCAN) 4 MG/0.1ML LIQD nasal spray kit 1 spray.     Peppermint Oil (IBGARD) 90 MG CPCR Take 2 capsules by mouth daily. Take 30-90 minutes before meals with water 60 capsule 3   topiramate (TOPAMAX) 100 MG tablet Take 100 mg by mouth at bedtime.     topiramate (TOPAMAX) 50 MG tablet Take 50 mg by mouth daily.     valsartan (DIOVAN) 160 MG tablet Take 1 tablet (160 mg total) by mouth  daily. (Patient not taking: Reported on 10/05/2023) 30 tablet 2   vitamin B-12 (CYANOCOBALAMIN) 1000 MCG tablet Take 1 tablet (1,000 mcg total) by mouth daily. 30 tablet 3   Current Facility-Administered Medications  Medication Dose Route Frequency Provider Last Rate Last Admin   0.9 %  sodium chloride infusion  500 mL Intravenous Once Napoleon Form, MD        Allergies as of 12/08/2023 - Review Complete 12/08/2023  Allergen Reaction Noted   Other Nausea Only and Other (See Comments) 04/21/2016   Azithromycin Nausea And Vomiting 01/14/2017   Ciprofloxacin Itching 04/13/2008    Family History  Problem Relation Age of Onset   Kidney disease Mother    Seizures Mother    Heart failure Father    Colon cancer Neg Hx    Breast cancer Neg Hx    Pancreatic cancer Neg Hx    Stomach cancer Neg Hx    Esophageal cancer Neg Hx    Liver disease Neg Hx    Rectal cancer Neg Hx    Colon polyps Neg Hx    Cancer - Colon Neg Hx     Social History   Socioeconomic History   Marital status: Single    Spouse name: Not on file   Number of children: 2   Years of education: 9   Highest education level: Not on file  Occupational History   Occupation: Disability    Comment: Heart Disease  Tobacco Use   Smoking status: Never   Smokeless tobacco: Never  Vaping Use   Vaping status: Never Used  Substance and Sexual Activity   Alcohol use: No   Drug use: No   Sexual activity: Yes    Birth control/protection: Surgical  Other Topics Concern   Not on file  Social History Narrative   Lives with 57 yo son. Mother passed in 2018   Social Drivers of Health   Financial Resource Strain: Low Risk  (03/29/2019)   Overall Financial Resource Strain (CARDIA)    Difficulty of Paying Living Expenses: Not hard at all  Food Insecurity: No Food Insecurity (03/29/2019)   Hunger Vital Sign    Worried About Running Out of Food in the Last Year: Never true    Ran  Out of Food in the Last Year: Never true   Transportation Needs: No Transportation Needs (03/29/2019)   PRAPARE - Administrator, Civil Service (Medical): No    Lack of Transportation (Non-Medical): No  Physical Activity: Insufficiently Active (03/29/2019)   Exercise Vital Sign    Days of Exercise per Week: 2 days    Minutes of Exercise per Session: 10 min  Stress: Stress Concern Present (03/29/2019)   [employer] of Occupational Health - Occupational Stress Questionnaire    Feeling of Stress : To some extent  Social Connections: Moderately Isolated (03/29/2019)   Social Connection and Isolation Panel [NHANES]    Frequency of Communication with Friends and Family: Twice a week    Frequency of Social Gatherings with Friends and Family: Once a week    Attends Religious Services: Never    Database administrator or Organizations: No    Attends Banker Meetings: Never    Marital Status: Divorced  Catering manager Violence: Not At Risk (03/29/2019)   Humiliation, Afraid, Rape, and Kick questionnaire    Fear of Current or Ex-Partner: No    Emotionally Abused: No    Physically Abused: No    Sexually Abused: No    Review of Systems:  All other review of systems negative except as mentioned in the HPI.  Physical Exam: Vital signs in last 24 hours: BP (!) 148/86   Pulse 62   Temp 97.7 F (36.5 C)   Resp 17   Ht 5' (1.524 m)   Wt 198 lb (89.8 kg)   SpO2 97%   BMI 38.67 kg/m  General:   Alert, NAD Lungs:  Clear .   Heart:  Regular rate and rhythm Abdomen:  Soft, nontender and nondistended. Neuro/Psych:  Alert and cooperative. Normal mood and affect. A and O x 3  Reviewed labs, radiology imaging, old records and pertinent past GI work up  Patient is appropriate for planned procedure(s) and anesthesia in an ambulatory setting   K. Scherry Ran , MD [phone]

## 2023-12-08 NOTE — Progress Notes (Unsigned)
Report to PACU, RN, vss, BBS= Clear.  

## 2023-12-08 NOTE — Patient Instructions (Addendum)
Handouts provided on diverticulosis and hemorrhoids.  Resume previous diet.  Continue present medications.  Repeat colonoscopy in 5 years for surveillance.   YOU HAD AN ENDOSCOPIC PROCEDURE TODAY AT THE [facility]:   Refer to the procedure report that was given to you for any specific questions about what was found during the examination.  If the procedure report does not answer your questions, please call your gastroenterologist to clarify.  If you requested that your care partner not be given the details of your procedure findings, then the procedure report has been included in a sealed envelope for you to review at your convenience later.  YOU SHOULD EXPECT: Some feelings of bloating in the abdomen. Passage of more gas than usual.  Walking can help get rid of the air that was put into your GI tract during the procedure and reduce the bloating. If you had a lower endoscopy (such as a colonoscopy or flexible sigmoidoscopy) you may notice spotting of blood in your stool or on the toilet paper. If you underwent a bowel prep for your procedure, you may not have a normal bowel movement for a few days.  Please Note:  You might notice some irritation and congestion in your nose or some drainage.  This is from the oxygen used during your procedure.  There is no need for concern and it should clear up in a day or so.  SYMPTOMS TO REPORT IMMEDIATELY:  Following lower endoscopy (colonoscopy or flexible sigmoidoscopy):  Excessive amounts of blood in the stool  Significant tenderness or worsening of abdominal pains  Swelling of the abdomen that is new, acute  Fever of 100F or higher  For urgent or emergent issues, a gastroenterologist can be reached at any hour by calling [phone]. Do not use MyChart messaging for urgent concerns.    DIET:  We do recommend a small meal at first, but then you may proceed to your regular diet.  Drink plenty of fluids but you should avoid alcoholic  beverages for 24 hours.  ACTIVITY:  You should plan to take it easy for the rest of today and you should NOT DRIVE or use heavy machinery until tomorrow (because of the sedation medicines used during the test).    FOLLOW UP: Our staff will call the number listed on your records the next business day following your procedure.  We will call around 7:15- 8:00 am to check on you and address any questions or concerns that you may have regarding the information given to you following your procedure. If we do not reach you, we will leave a message.     If any biopsies were taken you will be contacted by phone or by letter within the next 1-3 weeks.  Please call us at [phone] if you have not heard about the biopsies in 3 weeks.    SIGNATURES/CONFIDENTIALITY: You and/or your care partner have signed paperwork which will be entered into your electronic medical record.  These signatures attest to the fact that that the information above on your After Visit Summary has been reviewed and is understood.  Full responsibility of the confidentiality of this discharge information lies with you and/or your care-partner.

## 2023-12-08 NOTE — Progress Notes (Signed)
Pt's states no medical or surgical changes since previsit or office visit. 

## 2023-12-09 ENCOUNTER — Telehealth: Payer: Self-pay

## 2023-12-09 ENCOUNTER — Other Ambulatory Visit: Payer: 59

## 2023-12-09 NOTE — Telephone Encounter (Signed)
Attempted to reach patient for post-procedure f/u call. No answer. Left message for her to please not hesitate to call if she has any questions/concerns regarding her care. 

## 2023-12-14 ENCOUNTER — Telehealth: Payer: Self-pay | Admitting: Gastroenterology

## 2023-12-14 NOTE — Telephone Encounter (Signed)
PT had colonoscopy 12/17 and is still noticing a change in her bowel habits. She wants to know when will all the prep medication be out her body. Please advise.

## 2023-12-17 NOTE — Telephone Encounter (Signed)
Spoke with the patient. She is beginning to improve. She does not have any concerns about her bowels. She is seeing her PCP 12/25/23. She is experiencing urinary incontinence when she sneezes. She will address this concerns with PCP.

## 2023-12-24 ENCOUNTER — Other Ambulatory Visit: Payer: Self-pay | Admitting: *Deleted

## 2023-12-24 MED ORDER — LINACLOTIDE 145 MCG PO CAPS: 145.0000 ug | ORAL_CAPSULE | Freq: Every day | ORAL | 3 refills | Status: AC

## 2024-03-08 ENCOUNTER — Ambulatory Visit: Payer: 59 | Admitting: Gastroenterology

## 2024-10-22 ENCOUNTER — Emergency Department (HOSPITAL_COMMUNITY)

## 2024-10-22 ENCOUNTER — Inpatient Hospital Stay (HOSPITAL_COMMUNITY)
Admission: EM | Admit: 2024-10-22 | Discharge: 2024-10-24 | DRG: 066 | Disposition: A | Discharge status: Home / Self Care | Attending: Internal Medicine | Admitting: Internal Medicine

## 2024-10-22 ENCOUNTER — Encounter (HOSPITAL_COMMUNITY): Payer: Self-pay

## 2024-10-22 ENCOUNTER — Other Ambulatory Visit: Payer: Self-pay

## 2024-10-22 DIAGNOSIS — Z6838 Body mass index (BMI) 38.0-38.9, adult: Secondary | ICD-10-CM

## 2024-10-22 DIAGNOSIS — I2510 Atherosclerotic heart disease of native coronary artery without angina pectoris: Secondary | ICD-10-CM | POA: Diagnosis present

## 2024-10-22 DIAGNOSIS — F419 Anxiety disorder, unspecified: Secondary | ICD-10-CM | POA: Diagnosis present

## 2024-10-22 DIAGNOSIS — R471 Dysarthria and anarthria: Secondary | ICD-10-CM | POA: Diagnosis present

## 2024-10-22 DIAGNOSIS — E669 Obesity, unspecified: Secondary | ICD-10-CM | POA: Diagnosis present

## 2024-10-22 DIAGNOSIS — R4701 Aphasia: Secondary | ICD-10-CM | POA: Diagnosis present

## 2024-10-22 DIAGNOSIS — M1990 Unspecified osteoarthritis, unspecified site: Secondary | ICD-10-CM | POA: Diagnosis present

## 2024-10-22 DIAGNOSIS — R1310 Dysphagia, unspecified: Secondary | ICD-10-CM | POA: Diagnosis present

## 2024-10-22 DIAGNOSIS — I10 Essential (primary) hypertension: Secondary | ICD-10-CM | POA: Diagnosis present

## 2024-10-22 DIAGNOSIS — I63411 Cerebral infarction due to embolism of right middle cerebral artery: Secondary | ICD-10-CM | POA: Diagnosis not present

## 2024-10-22 DIAGNOSIS — Z7951 Long term (current) use of inhaled steroids: Secondary | ICD-10-CM

## 2024-10-22 DIAGNOSIS — E785 Hyperlipidemia, unspecified: Secondary | ICD-10-CM | POA: Diagnosis present

## 2024-10-22 DIAGNOSIS — J449 Chronic obstructive pulmonary disease, unspecified: Secondary | ICD-10-CM | POA: Diagnosis present

## 2024-10-22 DIAGNOSIS — Z23 Encounter for immunization: Secondary | ICD-10-CM

## 2024-10-22 DIAGNOSIS — R4781 Slurred speech: Secondary | ICD-10-CM | POA: Diagnosis not present

## 2024-10-22 DIAGNOSIS — Z7982 Long term (current) use of aspirin: Secondary | ICD-10-CM

## 2024-10-22 DIAGNOSIS — F32A Depression, unspecified: Secondary | ICD-10-CM | POA: Diagnosis present

## 2024-10-22 DIAGNOSIS — R29810 Facial weakness: Secondary | ICD-10-CM | POA: Diagnosis present

## 2024-10-22 DIAGNOSIS — Z881 Allergy status to other antibiotic agents status: Secondary | ICD-10-CM

## 2024-10-22 DIAGNOSIS — Z8249 Family history of ischemic heart disease and other diseases of the circulatory system: Secondary | ICD-10-CM

## 2024-10-22 DIAGNOSIS — G43909 Migraine, unspecified, not intractable, without status migrainosus: Secondary | ICD-10-CM | POA: Diagnosis present

## 2024-10-22 DIAGNOSIS — I639 Cerebral infarction, unspecified: Principal | ICD-10-CM | POA: Diagnosis present

## 2024-10-22 DIAGNOSIS — R29703 NIHSS score 3: Secondary | ICD-10-CM | POA: Diagnosis present

## 2024-10-22 DIAGNOSIS — I351 Nonrheumatic aortic (valve) insufficiency: Secondary | ICD-10-CM | POA: Diagnosis present

## 2024-10-22 DIAGNOSIS — Z79899 Other long term (current) drug therapy: Secondary | ICD-10-CM

## 2024-10-22 DIAGNOSIS — Z8419 Family history of other disorders of kidney and ureter: Secondary | ICD-10-CM

## 2024-10-22 LAB — DIFFERENTIAL
Abs Immature Granulocytes: 0 K/uL (ref 0.00–0.07)
Basophils Absolute: 0 K/uL (ref 0.0–0.1)
Basophils Relative: 1 %
Eosinophils Absolute: 0.1 K/uL (ref 0.0–0.5)
Eosinophils Relative: 2 %
Immature Granulocytes: 0 %
Lymphocytes Relative: 34 %
Lymphs Abs: 1.5 K/uL (ref 0.7–4.0)
Monocytes Absolute: 0.6 K/uL (ref 0.1–1.0)
Monocytes Relative: 13 %
Neutro Abs: 2.2 K/uL (ref 1.7–7.7)
Neutrophils Relative %: 50 %

## 2024-10-22 LAB — COMPREHENSIVE METABOLIC PANEL WITH GFR
ALT: 20 U/L (ref 0–44)
AST: 34 U/L (ref 15–41)
Albumin: 4 g/dL (ref 3.5–5.0)
Alkaline Phosphatase: 75 U/L (ref 38–126)
Anion gap: 11 (ref 5–15)
BUN: 15 mg/dL (ref 8–23)
CO2: 23 mmol/L (ref 22–32)
Calcium: 10.2 mg/dL (ref 8.9–10.3)
Chloride: 105 mmol/L (ref 98–111)
Creatinine, Ser: 1.14 mg/dL — ABNORMAL HIGH (ref 0.44–1.00)
GFR, Estimated: 52 mL/min — ABNORMAL LOW (ref >60)
Glucose, Bld: 106 mg/dL — ABNORMAL HIGH (ref 70–99)
Potassium: 3.8 mmol/L (ref 3.5–5.1)
Sodium: 139 mmol/L (ref 135–145)
Total Bilirubin: 0.6 mg/dL (ref 0.0–1.2)
Total Protein: 7.8 g/dL (ref 6.5–8.1)

## 2024-10-22 LAB — CBC
HCT: 41.5 % (ref 36.0–46.0)
Hemoglobin: 13.3 g/dL (ref 12.0–15.0)
MCH: 27.5 pg (ref 26.0–34.0)
MCHC: 32 g/dL (ref 30.0–36.0)
MCV: 85.9 fL (ref 80.0–100.0)
Platelets: 224 K/uL (ref 150–400)
RBC: 4.83 MIL/uL (ref 3.87–5.11)
RDW: 13.4 % (ref 11.5–15.5)
WBC: 4.4 K/uL (ref 4.0–10.5)
nRBC: 0 % (ref 0.0–0.2)

## 2024-10-22 LAB — PROTIME-INR
INR: 1 (ref 0.8–1.2)
Prothrombin Time: 13.5 s (ref 11.4–15.2)

## 2024-10-22 LAB — CBG MONITORING, ED: Glucose-Capillary: 110 mg/dL — ABNORMAL HIGH (ref 70–99)

## 2024-10-22 LAB — ETHANOL: Alcohol, Ethyl (B): <15 mg/dL (ref <15)

## 2024-10-22 LAB — APTT: aPTT: 26 s (ref 24–36)

## 2024-10-22 MED ORDER — SODIUM CHLORIDE 0.9% FLUSH
3.0000 mL | Freq: Once | INTRAVENOUS | Status: AC
  Administered 2024-10-22: 3 mL via INTRAVENOUS

## 2024-10-22 MED ORDER — ACETAMINOPHEN 325 MG PO TABS
650.0000 mg | ORAL_TABLET | Freq: Once | ORAL | Status: AC
  Filled 2024-10-22: qty 2

## 2024-10-22 MED ORDER — METOCLOPRAMIDE HCL 5 MG/ML IJ SOLN
10.0000 mg | Freq: Once | INTRAMUSCULAR | Status: AC
  Administered 2024-10-22: 10 mg via INTRAVENOUS
  Filled 2024-10-22: qty 2

## 2024-10-22 NOTE — ED Provider Triage Note (Signed)
 Emergency Medicine Provider Triage Evaluation Note  Catherine [name] , a 68 y.o. female  was evaluated in triage.  Pt complains of slurred speech and right sided facial numbness.  Patient reports she took a nap and when she woke up she was having some slurred speech and the right side of her face felt different.  She reports her last known well was around noon approximately 7 hours ago.  She denies any vision changes, confusion, numbness or weakness in extremities.  She does not have headache.  Denies injury trauma or fall.  Review of Systems  Positive: Slurred speech Negative: Arm weakness  Physical Exam  BP 137/84 (BP Location: Right Arm)   Pulse 71   Temp 98.6 F (37 C) (Oral)   Resp 18   Ht 5' (1.524 m)   Wt 89.4 kg   SpO2 97%   BMI 38.47 kg/m  Gen:   Awake, no distress   Resp:  Normal effort  MSK:   Moves extremities without difficulty  Other:  Patient is alert and oriented answering questions appropriately.  Pupils equal and reactive, no nystagmus.  Upper extremity sensation and strength intact.  Lower extremity sensation and strength intact.  No ataxia finger-to-nose.  Ambulates with steady gait.  Medical Decision Making  Medically screening exam initiated at 7:03 PM.  Appropriate orders placed.  Catherine [name] was informed that the remainder of the evaluation will be completed by another provider, this initial triage assessment does not replace that evaluation, and the importance of remaining in the ED until their evaluation is complete.  Given duration of symptoms patient would not be a candidate for TNK.  Will initiate workup.   Catherine Parkey N, PA-C 10/22/24 [id number]

## 2024-10-22 NOTE — ED Provider Notes (Signed)
  Provider Note MRN:  [id number]  Arrival date & time: 10/23/24    ED Course and Medical Decision Making  Assumed care of patient at sign-out or upon transfer.  Subjective speech disturbance for the past 8 hours awaiting MRI to exclude stroke.  Suspect would be appropriate for discharge if negative.  MRI shows acute ischemic stroke.  Will admit to medicine.  Neurology consulted and will follow.  .Critical Care  Performed by: Theadore Ozell HERO, MD Authorized by: Theadore Ozell HERO, MD   Critical care provider statement:    Critical care time (minutes):  30   Critical care was necessary to treat or prevent imminent or life-threatening deterioration of the following conditions:  CNS failure or compromise   Critical care was time spent personally by me on the following activities:  Development of treatment plan with patient or surrogate, discussions with consultants, evaluation of patient's response to treatment, examination of patient, ordering and review of laboratory studies, ordering and review of radiographic studies, ordering and performing treatments and interventions, pulse oximetry, re-evaluation of patient's condition and review of old charts   Final Clinical Impressions(s) / ED Diagnoses     ICD-10-CM   1. Acute ischemic stroke [facility])  I63.9       ED Discharge Orders     None       Discharge Instructions   None     Ozell HERO. Theadore, MD [facility] Emergency Medicine [facility] [email or web address]    Theadore Ozell HERO, MD 10/23/24 [phone]

## 2024-10-22 NOTE — ED Triage Notes (Addendum)
 Pt came in via POV d/t slurred speech that she noticed at 1745 when she woke from a nap, LKN was before she went to sleep at 12pm. No bil weakness in extremities, no facial droop, A/Ox4. Some numbness to Rt side of face & slurred speech noted in triage. Does have a HA, not on thinners.

## 2024-10-22 NOTE — ED Provider Notes (Signed)
 [place] EMERGENCY DEPARTMENT AT [facility] Provider Note   CSN: [id number] Arrival date & time: 10/22/24  1817     Patient presents with: Aphasia and Numbness Rt Side face   Catherine [name] is a 68 y.o. female.  {Add pertinent medical, surgical, social history, OB history to HPI:32947} HPI   Patient has a history of hypertension coronary artery disease COPD migraine headaches.  Patient states she took a nap when she woke up and became nauseous and started vomiting water.  She noticed that she was having slurred speech and the right side of her face felt different.  Last time she felt completely normal was around noon patient's not having any vision changes.  No weakness in her extremities.  Patient denies any headache.  She did have a migraine type headache the night prior  Prior to Admission medications   Medication Sig Start Date End Date Taking? Authorizing Provider  albuterol  (PROVENTIL  HFA;VENTOLIN  HFA) 108 (90 BASE) MCG/ACT inhaler Inhale 1-2 puffs into the lungs every 6 (six) hours as needed for wheezing or shortness of breath.    [provider]  albuterol  (PROVENTIL ) (2.5 MG/3ML) 0.083% nebulizer solution Inhale 2.5 mg into the lungs every 4 (four) hours as needed for wheezing or shortness of breath. As needed 06/02/18   [provider]  amLODipine -olmesartan (AZOR) 10-40 MG tablet Take 1 tablet by mouth daily. 10/02/23   [provider]  aspirin 81 MG chewable tablet Chew 81 mg by mouth daily. Patient not taking: Reported on 10/05/2023    [provider]  atenolol (TENORMIN) 50 MG tablet Take 50 mg by mouth daily. 05/08/23   [provider]  Butalbital -Acetaminophen  50-300 MG TABS SMARTSIG:1 Tablet(s) By Mouth Every 8-12 Hours PRN 07/31/23   [provider]  Butalbital -APAP-Caffeine 50-300-40 MG CAPS Take 1 capsule by mouth 2 (two) times daily as needed. 08/18/23   [provider]  Cholecalciferol (VITAMIN D  PO) Take 1 tablet by mouth daily.    [provider]  clonazePAM  (KLONOPIN ) 0.5 MG tablet clonazepam  0.5 mg tablet  Take 1 tablet twice a day by oral route as needed for 30 days.    [provider]  diclofenac  Sodium (VOLTAREN ) 1 % GEL Apply 1 application topically 4 (four) times daily as needed. Patient not taking: Reported on 10/05/2023 09/24/20   [provider]  dicyclomine  (BENTYL ) 10 MG capsule Take 1 capsule (10 mg total) by mouth in the morning and at bedtime. 07/15/23 10/05/23  Nandigam, Kavitha V, MD  FARXIGA 5 MG TABS tablet Take 5 mg by mouth daily. Patient not taking: Reported on 10/05/2023 06/16/23   [provider]  hydrocortisone  (ANUSOL -HC) 2.5 % rectal cream Place 1 Application rectally 2 (two) times daily as needed for hemorrhoids or anal itching. 12/08/23   Nandigam, Kavitha V, MD  linaclotide  (LINZESS ) 145 MCG CAPS capsule Take 1 capsule (145 mcg total) by mouth daily before breakfast. 12/24/23   Nandigam, Kavitha V, MD  meclizine (ANTIVERT) 25 MG tablet Take 1 tablet by mouth 2 (two) times daily as needed.    [provider]  naloxone [facility]) 4 MG/0.1ML LIQD nasal spray kit 1 spray.    [provider]  omeprazole  (PRILOSEC) 20 MG capsule Take 20 mg by mouth daily. Alternates with Pantoprazole  as needed.    [provider]  oxyCODONE -acetaminophen  (PERCOCET) 10-325 MG tablet Take 1 tablet by mouth 2 (two) times daily as needed. 09/24/23   [provider]  Peppermint Oil (  IBGARD) 90 MG CPCR Take 2 capsules by mouth daily. Take 30-90 minutes before meals with water 01/02/20   Nandigam, Kavitha V, MD  potassium chloride  (K-DUR) 10 MEQ tablet Take 1 tablet (10 mEq total) by mouth daily. Patient taking differently: Take 20 mEq by mouth daily. 01/20/17   Calone, Gregory D, FNP  rosuvastatin  (CRESTOR ) 20 MG tablet Take 20 mg by mouth at bedtime. 09/23/23   [provider]  SYMBICORT  80-4.5 MCG/ACT inhaler Use 2  puffs TWICE DAILY (120/4=30) Patient taking differently: Inhale 2 puffs into the lungs 2 (two) times daily. 01/20/17   Calone, Gregory D, FNP  topiramate  (TOPAMAX ) 100 MG tablet Take 100 mg by mouth at bedtime. 09/21/23   [provider]  topiramate  (TOPAMAX ) 50 MG tablet Take 50 mg by mouth daily. 10/05/23   [provider]  valsartan  (DIOVAN ) 160 MG tablet Take 1 tablet (160 mg total) by mouth daily. Patient not taking: Reported on 10/05/2023 07/02/23   Gretta Sayres R, PA-C  vitamin B-12 (CYANOCOBALAMIN) 1000 MCG tablet Take 1 tablet (1,000 mcg total) by mouth daily. 11/22/21   Dorsey, John T IV, MD    Allergies: Other, Azithromycin , and Ciprofloxacin    Review of Systems  Updated Vital Signs BP 137/84 (BP Location: Right Arm)   Pulse 71   Temp 98.6 F (37 C) (Oral)   Resp 18   Ht 1.524 m (5')   Wt 89.4 kg   SpO2 97%   BMI 38.47 kg/m   Physical Exam Vitals and nursing note reviewed.  Constitutional:      General: She is not in acute distress.    Appearance: She is well-developed.  HENT:     Head: Normocephalic and atraumatic.     Right Ear: External ear normal.     Left Ear: External ear normal.  Eyes:     General: No visual field deficit or scleral icterus.       Right eye: No discharge.        Left eye: No discharge.     Conjunctiva/sclera: Conjunctivae normal.  Neck:     Trachea: No tracheal deviation.  Cardiovascular:     Rate and Rhythm: Normal rate and regular rhythm.  Pulmonary:     Effort: Pulmonary effort is normal. No respiratory distress.     Breath sounds: Normal breath sounds. No stridor. No wheezing or rales.  Abdominal:     General: Bowel sounds are normal. There is no distension.     Palpations: Abdomen is soft.     Tenderness: There is no abdominal tenderness. There is no guarding or rebound.  Musculoskeletal:        General: No tenderness.     Cervical back: Neck supple.  Skin:    General: Skin is warm and dry.     Findings: No  rash.  Neurological:     Mental Status: She is alert and oriented to person, place, and time.     Cranial Nerves: No cranial nerve deficit, dysarthria or facial asymmetry.     Sensory: No sensory deficit.     Motor: No abnormal muscle tone, seizure activity or pronator drift.     Coordination: Coordination normal.     Comments:  able to hold both legs off bed for 5 seconds, sensation intact in all extremities,  no left or right sided neglect, normal finger-nose exam bilaterally, no nystagmus noted   Psychiatric:        Mood and Affect: Mood normal.     (  all labs ordered are listed, but only abnormal results are displayed) Labs Reviewed  COMPREHENSIVE METABOLIC PANEL WITH GFR - Abnormal; Notable for the following components:      Result Value   Glucose, Bld 106 (*)    Creatinine, Ser 1.14 (*)    GFR, Estimated 52 (*)    All other components within normal limits  CBG MONITORING, ED - Abnormal; Notable for the following components:   Glucose-Capillary 110 (*)    All other components within normal limits  PROTIME-INR  APTT  CBC  DIFFERENTIAL  ETHANOL  CBG MONITORING, ED    EKG: None  Radiology: No results found.  {Document cardiac monitor, telemetry assessment procedure when appropriate:32947} Procedures   Medications Ordered in the ED  sodium chloride  flush (NS) 0.9 % injection 3 mL (has no administration in time range)    Clinical Course as of 10/22/24 1951  Sat Oct 22, 2024  1951 Comprehensive metabolic panel(!) Metabolic panel normal.  CBC normal. [JK]    Clinical Course User Index [JK] Randol Simmonds, MD   {Click here for ABCD2, HEART and other calculators REFRESH Note before signing:1}                              Medical Decision Making Amount and/or Complexity of Data Reviewed Labs:  Decision-making details documented in ED Course.   ***  {Document critical care time when appropriate  Document review of labs and clinical decision tools ie CHADS2VASC2,  etc  Document your independent review of radiology images and any outside records  Document your discussion with family members, caretakers and with consultants  Document social determinants of health affecting pt's care  Document your decision making why or why not admission, treatments were needed:32947:::1}   Final diagnoses:  None    ED Discharge Orders     None

## 2024-10-23 ENCOUNTER — Inpatient Hospital Stay (HOSPITAL_COMMUNITY)

## 2024-10-23 ENCOUNTER — Emergency Department (HOSPITAL_COMMUNITY)

## 2024-10-23 DIAGNOSIS — E669 Obesity, unspecified: Secondary | ICD-10-CM | POA: Diagnosis present

## 2024-10-23 DIAGNOSIS — Z8249 Family history of ischemic heart disease and other diseases of the circulatory system: Secondary | ICD-10-CM | POA: Diagnosis not present

## 2024-10-23 DIAGNOSIS — Z7982 Long term (current) use of aspirin: Secondary | ICD-10-CM | POA: Diagnosis not present

## 2024-10-23 DIAGNOSIS — I63411 Cerebral infarction due to embolism of right middle cerebral artery: Secondary | ICD-10-CM | POA: Diagnosis present

## 2024-10-23 DIAGNOSIS — R4701 Aphasia: Secondary | ICD-10-CM | POA: Diagnosis present

## 2024-10-23 DIAGNOSIS — I2510 Atherosclerotic heart disease of native coronary artery without angina pectoris: Secondary | ICD-10-CM | POA: Diagnosis present

## 2024-10-23 DIAGNOSIS — R29702 NIHSS score 2: Secondary | ICD-10-CM | POA: Diagnosis not present

## 2024-10-23 DIAGNOSIS — Z79899 Other long term (current) drug therapy: Secondary | ICD-10-CM | POA: Diagnosis not present

## 2024-10-23 DIAGNOSIS — R29703 NIHSS score 3: Secondary | ICD-10-CM | POA: Diagnosis present

## 2024-10-23 DIAGNOSIS — J449 Chronic obstructive pulmonary disease, unspecified: Secondary | ICD-10-CM | POA: Diagnosis present

## 2024-10-23 DIAGNOSIS — F32A Depression, unspecified: Secondary | ICD-10-CM | POA: Diagnosis present

## 2024-10-23 DIAGNOSIS — E785 Hyperlipidemia, unspecified: Secondary | ICD-10-CM | POA: Diagnosis present

## 2024-10-23 DIAGNOSIS — I69391 Dysphagia following cerebral infarction: Secondary | ICD-10-CM

## 2024-10-23 DIAGNOSIS — I351 Nonrheumatic aortic (valve) insufficiency: Secondary | ICD-10-CM | POA: Diagnosis present

## 2024-10-23 DIAGNOSIS — I10 Essential (primary) hypertension: Secondary | ICD-10-CM | POA: Diagnosis present

## 2024-10-23 DIAGNOSIS — R1310 Dysphagia, unspecified: Secondary | ICD-10-CM | POA: Diagnosis present

## 2024-10-23 DIAGNOSIS — Z7951 Long term (current) use of inhaled steroids: Secondary | ICD-10-CM | POA: Diagnosis not present

## 2024-10-23 DIAGNOSIS — I639 Cerebral infarction, unspecified: Secondary | ICD-10-CM | POA: Diagnosis not present

## 2024-10-23 DIAGNOSIS — Z881 Allergy status to other antibiotic agents status: Secondary | ICD-10-CM | POA: Diagnosis not present

## 2024-10-23 DIAGNOSIS — I6389 Other cerebral infarction: Secondary | ICD-10-CM | POA: Diagnosis not present

## 2024-10-23 DIAGNOSIS — Z6838 Body mass index (BMI) 38.0-38.9, adult: Secondary | ICD-10-CM | POA: Diagnosis not present

## 2024-10-23 DIAGNOSIS — I739 Peripheral vascular disease, unspecified: Secondary | ICD-10-CM

## 2024-10-23 DIAGNOSIS — R29810 Facial weakness: Secondary | ICD-10-CM | POA: Diagnosis present

## 2024-10-23 DIAGNOSIS — M1990 Unspecified osteoarthritis, unspecified site: Secondary | ICD-10-CM | POA: Diagnosis present

## 2024-10-23 DIAGNOSIS — F419 Anxiety disorder, unspecified: Secondary | ICD-10-CM | POA: Diagnosis present

## 2024-10-23 DIAGNOSIS — I671 Cerebral aneurysm, nonruptured: Secondary | ICD-10-CM | POA: Diagnosis not present

## 2024-10-23 DIAGNOSIS — R471 Dysarthria and anarthria: Secondary | ICD-10-CM | POA: Diagnosis present

## 2024-10-23 DIAGNOSIS — G43909 Migraine, unspecified, not intractable, without status migrainosus: Secondary | ICD-10-CM | POA: Diagnosis present

## 2024-10-23 DIAGNOSIS — Z23 Encounter for immunization: Secondary | ICD-10-CM | POA: Diagnosis present

## 2024-10-23 DIAGNOSIS — R4781 Slurred speech: Secondary | ICD-10-CM | POA: Diagnosis present

## 2024-10-23 DIAGNOSIS — Z8419 Family history of other disorders of kidney and ureter: Secondary | ICD-10-CM | POA: Diagnosis not present

## 2024-10-23 LAB — HEMOGLOBIN A1C
Hgb A1c MFr Bld: 5.5 % (ref 4.8–5.6)
Mean Plasma Glucose: 111.15 mg/dL

## 2024-10-23 LAB — ECHOCARDIOGRAM COMPLETE
AR max vel: 2.71 cm2
AV Peak grad: 5.6 mmHg
Ao pk vel: 1.18 m/s
Area-P 1/2: 4.21 cm2
Height: 60 in
S' Lateral: 3.2 cm
Weight: 3152 [oz_av]

## 2024-10-23 LAB — LIPID PANEL
Cholesterol: 119 mg/dL (ref 0–200)
HDL: 51 mg/dL (ref >40)
LDL Cholesterol: 55 mg/dL (ref 0–99)
Total CHOL/HDL Ratio: 2.3 ratio
Triglycerides: 66 mg/dL (ref <150)
VLDL: 13 mg/dL (ref 0–40)

## 2024-10-23 MED ORDER — IOHEXOL 350 MG/ML SOLN
75.0000 mL | Freq: Once | INTRAVENOUS | Status: AC | PRN
  Administered 2024-10-23: 75 mL via INTRAVENOUS

## 2024-10-23 MED ORDER — PANTOPRAZOLE SODIUM 40 MG PO TBEC
40.0000 mg | DELAYED_RELEASE_TABLET | Freq: Every day | ORAL | Status: DC
  Administered 2024-10-23 – 2024-10-24 (×2): 40 mg via ORAL
  Filled 2024-10-23 (×2): qty 1

## 2024-10-23 MED ORDER — ASPIRIN 81 MG PO TBEC
81.0000 mg | DELAYED_RELEASE_TABLET | Freq: Every day | ORAL | Status: DC
  Administered 2024-10-23 – 2024-10-24 (×2): 81 mg via ORAL
  Filled 2024-10-23 (×2): qty 1

## 2024-10-23 MED ORDER — DICYCLOMINE HCL 10 MG PO CAPS
10.0000 mg | ORAL_CAPSULE | Freq: Two times a day (BID) | ORAL | Status: DC
Start: 2024-10-23 — End: 2024-10-23

## 2024-10-23 MED ORDER — ATENOLOL 25 MG PO TABS
50.0000 mg | ORAL_TABLET | Freq: Every day | ORAL | Status: DC
  Administered 2024-10-23 – 2024-10-24 (×2): 50 mg via ORAL
  Filled 2024-10-23: qty 1
  Filled 2024-10-23: qty 2

## 2024-10-23 MED ORDER — TOPIRAMATE 25 MG PO TABS: 50.0000 mg | ORAL_TABLET | Freq: Every day | ORAL | Status: DC

## 2024-10-23 MED ORDER — FLUTICASONE FUROATE-VILANTEROL 100-25 MCG/ACT IN AEPB
1.0000 | INHALATION_SPRAY | Freq: Every day | RESPIRATORY_TRACT | Status: DC
Start: 2024-10-23 — End: 2024-10-24
  Filled 2024-10-23 (×2): qty 28

## 2024-10-23 MED ORDER — IPRATROPIUM-ALBUTEROL 0.5-2.5 (3) MG/3ML IN SOLN: 3.0000 mL | Freq: Four times a day (QID) | RESPIRATORY_TRACT | Status: DC | PRN

## 2024-10-23 MED ORDER — TOPIRAMATE 25 MG PO TABS: 100.0000 mg | ORAL_TABLET | Freq: Every day | ORAL | Status: DC

## 2024-10-23 MED ORDER — AMLODIPINE BESYLATE 10 MG PO TABS
10.0000 mg | ORAL_TABLET | Freq: Every day | ORAL | Status: DC
  Administered 2024-10-23 – 2024-10-24 (×2): 10 mg via ORAL
  Filled 2024-10-23: qty 1
  Filled 2024-10-23: qty 2

## 2024-10-23 MED ORDER — IRBESARTAN 150 MG PO TABS
300.0000 mg | ORAL_TABLET | Freq: Every day | ORAL | Status: DC
  Administered 2024-10-23 – 2024-10-24 (×2): 300 mg via ORAL
  Filled 2024-10-23: qty 2
  Filled 2024-10-23: qty 1

## 2024-10-23 MED ORDER — ACETAMINOPHEN 325 MG PO TABS
ORAL_TABLET | ORAL | Status: AC
Start: 2024-10-23 — End: 2024-10-23
  Administered 2024-10-23: 650 mg via ORAL
  Filled 2024-10-23: qty 2

## 2024-10-23 MED ORDER — TOPIRAMATE 25 MG PO TABS
75.0000 mg | ORAL_TABLET | Freq: Every day | ORAL | Status: DC
  Filled 2024-10-23: qty 3

## 2024-10-23 MED ORDER — STROKE: EARLY STAGES OF RECOVERY BOOK
Freq: Once | Status: AC
  Filled 2024-10-23: qty 1

## 2024-10-23 MED ORDER — ATOGEPANT 30 MG PO TABS: 1.0000 | ORAL_TABLET | Freq: Every day | ORAL | Status: DC

## 2024-10-23 MED ORDER — ASPIRIN 81 MG PO CHEW: 81.0000 mg | CHEWABLE_TABLET | Freq: Every day | ORAL | Status: DC

## 2024-10-23 MED ORDER — CLOPIDOGREL BISULFATE 300 MG PO TABS
300.0000 mg | ORAL_TABLET | Freq: Once | ORAL | Status: AC
  Administered 2024-10-23: 300 mg via ORAL
  Filled 2024-10-23: qty 1

## 2024-10-23 MED ORDER — AMLODIPINE-OLMESARTAN 10-40 MG PO TABS: 1.0000 | ORAL_TABLET | Freq: Every day | ORAL | Status: DC

## 2024-10-23 MED ORDER — ROSUVASTATIN CALCIUM 20 MG PO TABS
20.0000 mg | ORAL_TABLET | Freq: Every day | ORAL | Status: DC
  Administered 2024-10-23: 20 mg via ORAL
  Filled 2024-10-23: qty 1

## 2024-10-23 MED ORDER — CLOPIDOGREL BISULFATE 75 MG PO TABS
75.0000 mg | ORAL_TABLET | Freq: Every day | ORAL | Status: DC
  Administered 2024-10-24: 75 mg via ORAL
  Filled 2024-10-23: qty 1

## 2024-10-23 NOTE — ED Notes (Signed)
 This RN updated the family about the patient's care.

## 2024-10-23 NOTE — H&P (Addendum)
 History and Physical    Patient: Catherine [name] FMW:[id number] DOB: [birth year] DOA: 10/22/2024 DOS: the patient was seen and examined on 10/23/2024 PCP: Leron Millman, NP  Patient coming from: Home  Chief Complaint:  Chief Complaint  Patient presents with   Aphasia   Numbness Rt Side face   HPI: Catherine [name] is a 68 y.o. female with medical history significant of CAD, HTN, and COPD who p/w L facial weakness iso acute R precentral gyrus embolic stroke.  The patient reported experiencing symptoms starting on Sunday morning after staying up late the previous night watching the home shopping channel. Upon waking, the patient washed their face, ate breakfast, and took a nap. After the nap, the patient felt unusual and began vomiting plain water. The patient noted a significant slurring of speech around 5 PM, which prompted concern about a possible stroke. The patient contacted their son and daughter, who had difficulty understanding the patient due to the speech slurring. The patient also noticed a slight shift in facial expression on both sides. The patient reported feeling sluggish but denied any previous history of stroke or seizures. The patient's son brought them to the hospital.  In the ED, pt AFVSS. Labs unremarkable. MRI brain WO showed acute non-hemorrhagic infarct in the right precentral gyrus. EDP consulted Neurology and requested medicine admission.    Review of Systems: As mentioned in the history of present illness. All other systems reviewed and are negative. Past Medical History:  Diagnosis Date   Anxiety    Arthritis    CAD (coronary artery disease)    COPD (chronic obstructive pulmonary disease) (HCC)    Depression    Headache    migraines   Hypertension    Lumbar spinal stenosis    Obesity    Spondylolisthesis at L4-L5 level    Vertigo    Wears glasses    Past Surgical History:  Procedure Laterality Date   BREAST BIOPSY Left 07/2021   BREAST CYST EXCISION Left  02/2020   CARDIAC CATHETERIZATION     COLONOSCOPY W/ BIOPSIES AND POLYPECTOMY     MULTIPLE TOOTH EXTRACTIONS     TRANSFORAMINAL LUMBAR INTERBODY FUSION (TLIF) WITH PEDICLE SCREW FIXATION 1 LEVEL Left 09/16/2018   Procedure: LEFT-SIDED LUMBAR 4-5 TRANSFORAMINAL LUMBAR INTERBODY FUSION WITH INSTRUMENTATION AND ALLOGRAFT;  Surgeon: Beuford Anes, MD;  Location: [facility] OR;  Service: Orthopedics;  Laterality: Left;   TUBAL LIGATION     Social History:  reports that she has never smoked. She has never used smokeless tobacco. She reports that she does not drink alcohol and does not use drugs.  Allergies  Allergen Reactions   Other Nausea Only and Other (See Comments)    Darvocet   Azithromycin  Nausea And Vomiting   Ciprofloxacin Itching    Family History  Problem Relation Age of Onset   Kidney disease Mother    Seizures Mother    Heart failure Father    Colon cancer Neg Hx    Breast cancer Neg Hx    Pancreatic cancer Neg Hx    Stomach cancer Neg Hx    Esophageal cancer Neg Hx    Liver disease Neg Hx    Rectal cancer Neg Hx    Colon polyps Neg Hx    Cancer - Colon Neg Hx     Prior to Admission medications   Medication Sig Start Date End Date Taking? Authorizing Provider  albuterol  (PROVENTIL  HFA;VENTOLIN  HFA) 108 (90 BASE) MCG/ACT inhaler Inhale 1-2 puffs into the lungs every 6 (  six) hours as needed for wheezing or shortness of breath.    [provider]  albuterol  (PROVENTIL ) (2.5 MG/3ML) 0.083% nebulizer solution Inhale 2.5 mg into the lungs every 4 (four) hours as needed for wheezing or shortness of breath. As needed 06/02/18   [provider]  amLODipine -olmesartan (AZOR) 10-40 MG tablet Take 1 tablet by mouth daily. 10/02/23   [provider]  aspirin 81 MG chewable tablet Chew 81 mg by mouth daily. Patient not taking: Reported on 10/05/2023    [provider]  atenolol (TENORMIN) 50 MG tablet Take 50 mg by mouth daily. 05/08/23   [provider]  Butalbital -Acetaminophen  50-300 MG TABS SMARTSIG:1 Tablet(s) By Mouth Every 8-12 Hours PRN 07/31/23   [provider]  Butalbital -APAP-Caffeine 50-300-40 MG CAPS Take 1 capsule by mouth 2 (two) times daily as needed. 08/18/23   [provider]  Cholecalciferol (VITAMIN D PO) Take 1 tablet by mouth daily.    [provider]  clonazePAM  (KLONOPIN ) 0.5 MG tablet clonazepam  0.5 mg tablet  Take 1 tablet twice a day by oral route as needed for 30 days.    [provider]  diclofenac  Sodium (VOLTAREN ) 1 % GEL Apply 1 application topically 4 (four) times daily as needed. Patient not taking: Reported on 10/05/2023 09/24/20   [provider]  dicyclomine  (BENTYL ) 10 MG capsule Take 1 capsule (10 mg total) by mouth in the morning and at bedtime. 07/15/23 10/05/23  Nandigam, Kavitha V, MD  FARXIGA 5 MG TABS tablet Take 5 mg by mouth daily. Patient not taking: Reported on 10/05/2023 06/16/23   [provider]  hydrocortisone  (ANUSOL -HC) 2.5 % rectal cream Place 1 Application rectally 2 (two) times daily as needed for hemorrhoids or anal itching. 12/08/23   Nandigam, Kavitha V, MD  linaclotide  (LINZESS ) 145 MCG CAPS capsule Take 1 capsule (145 mcg total) by mouth daily before breakfast. 12/24/23   Nandigam, Kavitha V, MD  meclizine (ANTIVERT) 25 MG tablet Take 1 tablet by mouth 2 (two) times daily as needed.    [provider]  MYRBETRIQ 50 MG TB24 tablet Take 50 mg by mouth daily. 09/12/24   [provider]  naloxone (NARCAN) 4 MG/0.1ML LIQD nasal spray kit 1 spray.    [provider]  omeprazole  (PRILOSEC) 20 MG capsule Take 20 mg by mouth daily. Alternates with Pantoprazole  as needed.    [provider]  oxyCODONE -acetaminophen  (PERCOCET) 10-325 MG tablet Take 1 tablet by mouth 2 (two) times daily as needed. 09/24/23   [provider]  Peppermint Oil (IBGARD) 90 MG CPCR Take 2 capsules by mouth daily. Take  30-90 minutes before meals with water 01/02/20   Nandigam, Kavitha V, MD  potassium chloride  (K-DUR) 10 MEQ tablet Take 1 tablet (10 mEq total) by mouth daily. Patient taking differently: Take 20 mEq by mouth daily. 01/20/17   Calone, Gregory D, FNP  potassium citrate (UROCIT-K) 10 MEQ (1080 MG) SR tablet Take 10 mEq by mouth daily. 09/20/24   [provider]  QULIPTA 30 MG TABS Take 1 tablet by mouth daily. 09/29/24   [provider]  rosuvastatin  (CRESTOR ) 20 MG tablet Take 20 mg by mouth at bedtime. 09/23/23   [provider]  SYMBICORT  80-4.5 MCG/ACT inhaler Use 2 puffs TWICE DAILY (120/4=30) Patient taking differently: Inhale 2 puffs into the lungs 2 (two) times daily. 01/20/17   Calone, Gregory D, FNP  topiramate  (TOPAMAX ) 100 MG tablet Take 100 mg by mouth at bedtime.  09/21/23   [provider]  topiramate  (TOPAMAX ) 50 MG tablet Take 50 mg by mouth daily. 10/05/23   [provider]  valsartan  (DIOVAN ) 160 MG tablet Take 1 tablet (160 mg total) by mouth daily. Patient not taking: Reported on 10/05/2023 07/02/23   Gretta Sayres R, PA-C  vitamin B-12 (CYANOCOBALAMIN) 1000 MCG tablet Take 1 tablet (1,000 mcg total) by mouth daily. 11/22/21   Federico Norleen ONEIDA MADISON, MD    Physical Exam: Vitals:   10/23/24 0404 10/23/24 0721 10/23/24 0800 10/23/24 0834  BP: 120/88 (!) 145/79 126/64   Pulse: (!) 53 (!) 59 (!) 57   Resp: 17 16 16    Temp: 97.9 F (36.6 C)   (!) 97.5 F (36.4 C)  TempSrc:    Oral  SpO2: 98% 99% 97%   Weight:      Height:       General: Alert, oriented x3, resting comfortably in no acute distress HEENT: EOMI, oropharynx clear, moist mucous membranes, hearing intact Neck: Trachea midline and no gross thyromegaly Respiratory: Lungs clear to auscultation bilaterally with normal respiratory effort; no w/r/r Cardiovascular: Regular rate and rhythm w/o m/r/g Abdomen: Soft, nontender, nondistended. Positive bowel sounds MSK: No obvious joint  deformities or swelling Skin: No obvious rashes or lesions Neurologic: Awake, alert, spontaneously moves all extremities, strength intact Psychiatric: Appropriate mood and affect, conversational and cooperative   Data Reviewed:  Lab Results  Component Value Date   WBC 4.4 10/22/2024   HGB 13.3 10/22/2024   HCT 41.5 10/22/2024   MCV 85.9 10/22/2024   PLT 224 10/22/2024   Lab Results  Component Value Date   GLUCOSE 106 (H) 10/22/2024   CALCIUM  10.2 10/22/2024   NA 139 10/22/2024   K 3.8 10/22/2024   CO2 23 10/22/2024   CL 105 10/22/2024   BUN 15 10/22/2024   CREATININE 1.14 (H) 10/22/2024   Lab Results  Component Value Date   ALT 20 10/22/2024   AST 34 10/22/2024   ALKPHOS 75 10/22/2024   BILITOT 0.6 10/22/2024   Lab Results  Component Value Date   INR 1.0 10/22/2024   INR 1.05 09/14/2018   INR 0.98 10/17/2010   Radiology: CT ANGIO HEAD NECK W WO CM Result Date: 10/23/2024 EXAM: CTA HEAD AND NECK WITH AND WITHOUT 10/23/2024 07:12:55 AM TECHNIQUE: CTA of the head and neck was performed with and without the administration of intravenous contrast. 75 mL (iohexol  (OMNIPAQUE ) 350 MG/ML injection 75 mL IOHEXOL  350 MG/ML SOLN) was administered. Multiplanar 2D and/or 3D reformatted images are provided for review. Automated exposure control, iterative reconstruction, and/or weight based adjustment of the mA/kV was utilized to reduce the radiation dose to as low as reasonably achievable. Stenosis of the internal carotid arteries measured using NASCET criteria. COMPARISON: None available CLINICAL HISTORY: Stroke, follow up. FINDINGS: CTA NECK: AORTIC ARCH AND ARCH VESSELS: No dissection or arterial injury. No significant stenosis of the brachiocephalic or subclavian arteries. CERVICAL CAROTID ARTERIES: No dissection, arterial injury, or hemodynamically significant stenosis by NASCET criteria. CERVICAL VERTEBRAL ARTERIES: No dissection, arterial injury, or significant stenosis. LUNGS AND  MEDIASTINUM: Unremarkable. SOFT TISSUES: Numerous calcifications scattered throughout the parotid glands bilaterally. There is mild mucosal disease within the right maxillary sinus. BONES: No acute abnormality. 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 The right A1 segment appears to be absent. The  middle cerebral arteries appear to be normal in caliber. There is no evidence of flow-limiting stenosis or a large vessel occlusion. No significant stenosis of the internal carotid arteries. No significant stenosis of the anterior cerebral arteries (except for the absent right A1 segment). POSTERIOR CIRCULATION: There is fetal type origin of the right posterior cerebral artery. No significant stenosis of the posterior cerebral arteries (except for the fetal origin of the right PCA). No significant stenosis of the basilar artery. No significant stenosis of the vertebral arteries. No aneurysm. OTHER: No dural venous sinus thrombosis on this non-dedicated study. IMPRESSION: 1. 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 2. No large vessel occlusion or hemodynamically significant stenosis in the head or neck. 3. Fetal-type origin of the right posterior cerebral artery. 4. Right A1 segment appears absent (anatomic variant). 5. Mild mucosal disease in the right maxillary sinus. Electronically signed by: Evalene Coho MD 10/23/2024 07:41 AM EST RP Workstation: [id number]   MR BRAIN WO CONTRAST Result Date: 10/23/2024 EXAM: MRI BRAIN WITHOUT CONTRAST CURRENT DATE TECHNIQUE: Multiplanar multisequence MRI of the head/brain was performed without the administration of intravenous contrast. COMPARISON: CT of the head dated 10/22/2024 and MRI of the head dated 12/04/2009. CLINICAL HISTORY: FINDINGS: BRAIN AND VENTRICLES: There is restricted  diffusion present within the right precentral gyrus consistent with acute non-hemorrhagic infarct. No intracranial hemorrhage. No mass. No midline shift. No hydrocephalus. There is extensive diffuse cerebral white matter disease. There are also mildly prominent perivascular spaces in the cerebral vertices bilaterally. The sella is unremarkable. Normal flow voids. ORBITS: No acute abnormality. SINUSES AND MASTOIDS: No acute abnormality. BONES AND SOFT TISSUES: Normal marrow signal. No acute soft tissue abnormality. IMPRESSION: 1. Acute non-hemorrhagic infarct in the right precentral gyrus. 2. Extensive diffuse cerebral white matter disease. Electronically signed by: Evalene Coho MD 10/23/2024 05:11 AM EST RP Workstation: [id number]   CT Head Wo Contrast Result Date: 10/22/2024 CLINICAL DATA:  Slurred speech and right-sided facial numbness. EXAM: CT HEAD WITHOUT CONTRAST TECHNIQUE: Contiguous axial images were obtained from the base of the skull through the vertex without intravenous contrast. RADIATION DOSE REDUCTION: This exam was performed according to the departmental dose-optimization program which includes automated exposure control, adjustment of the mA and/or kV according to patient size and/or use of iterative reconstruction technique. COMPARISON:  July 02, 2023 FINDINGS: Brain: There is generalized cerebral atrophy with widening of the extra-axial spaces and ventricular dilatation. There are areas of decreased attenuation within the white matter tracts of the supratentorial brain, consistent with microvascular disease changes. Small, bilateral chronic basal ganglia lacunar infarcts are noted. Vascular: No hyperdense vessel or unexpected calcification. Skull: Normal. Negative for fracture or focal lesion. Sinuses/Orbits: There is an 11 mm posterior right maxillary sinus polyp versus mucous retention cyst. Other: Innumerable punctate calcifications are seen within the bilateral parotid glands.  IMPRESSION: 1. Generalized cerebral atrophy with chronic white matter small vessel ischemic changes. 2. Small, bilateral chronic basal ganglia lacunar infarcts. 3. No acute intracranial abnormality. Electronically Signed   By: Suzen Dials M.D.   On: 10/22/2024 20:14    Assessment and Plan: 24F h/o CAD, HTN, and COPD who p/w L facial weakness iso acute R precentral gyrus embolic stroke.  Acute R precentral gyrus embolic stroke -Neuro following; appreciate eval/recs -PT/OT/SLP following; appreciate recs -Cardiac telemetry; consider 30d holter monitor at d/c if no arrhythmias captures -Allow permissive HTN (220/120 due to acute stroke) -Risk factor modification -Frequent neuro checks per protocol -F/u HgbA1c, fasting lipid panel -  F/u TTE  HTN -PTA Azor 10-40mg  daily  COPD -Breztri daily (in lieu of pta Symbicort ) + Duonebs prn   Advance Care Planning:   Code Status: Full Code   Consults: Neurology  Family Communication: N/A  Severity of Illness: The appropriate patient status for this patient is INPATIENT. Inpatient status is judged to be reasonable and necessary in order to provide the required intensity of service to ensure the patient's safety. The patient's presenting symptoms, physical exam findings, and initial radiographic and laboratory data in the context of their chronic comorbidities is felt to place them at high risk for further clinical deterioration. Furthermore, it is not anticipated that the patient will be medically stable for discharge from the hospital within 2 midnights of admission.   * I certify that at the point of admission it is my clinical judgment that the patient will require inpatient hospital care spanning beyond 2 midnights from the point of admission due to high intensity of service, high risk for further deterioration and high frequency of surveillance required.*   ------- I spent 55 minutes reviewing previous notes, at the bedside  counseling/discussing the treatment plan, and performing clinical documentation.  Author: Marsha Ada, MD 10/23/2024 8:56 AM  For on call review [email or web address].

## 2024-10-23 NOTE — Progress Notes (Signed)
 OT Cancellation Note  Patient Details Name: Scarlettrose Costilow MRN: [id number] DOB: [birth year]   Cancelled Treatment:    Reason Eval/Treat Not Completed: OT screened, no needs identified, will sign off. Per secure chat with PT, pt ind no self-care concerns. OT is signing off on this pt. Please re-consult as needed.   Anelisse Jacobson C, OT  Acute Rehabilitation Services Office [phone] Secure chat preferred   Adrianne GORMAN Savers 10/23/2024, 10:54 AM

## 2024-10-23 NOTE — Consult Note (Signed)
 NEUROLOGY CONSULT NOTE   Date of service: October 23, 2024 Patient Name: Shanie Mauzy MRN:  [id number] DOB:  1956-11-23 Chief Complaint: Speech difficulty Requesting Provider: Theadore Ozell HERO, MD  History of Present Illness  Seren Chaloux is a 68 y.o. female with hx of hypertension, CAD, COPD who presents with right facial numbness as well as speech difficulty that started on awakening yesterday morning  LKW: 10/31 prior to bed IV Thrombolysis: No, outside of window EVT: No, no LVO signs  NIHSS components Score: Comment  1a Level of Conscious 0[]  1[]  2[]  3[]      1b LOC Questions 0[]  1[]  2[]       1c LOC Commands 0[]  1[]  2[]       2 Best Gaze 0[]  1[]  2[]       3 Visual 0[]  1[]  2[]  3[]      4 Facial Palsy 0[]  1[x]  2[]  3[]      5a Motor Arm - left 0[]  1[]  2[]  3[]  4[]  UN[]    5b Motor Arm - Right 0[]  1[]  2[]  3[]  4[]  UN[]    6a Motor Leg - Left 0[]  1[]  2[]  3[]  4[]  UN[]    6b Motor Leg - Right 0[]  1[]  2[]  3[]  4[]  UN[]    7 Limb Ataxia 0[]  1[]  2[]  UN[]      8 Sensory 0[]  1[x]  2[]  UN[]      9 Best Language 0[]  1[]  2[]  3[]      10 Dysarthria 0[]  1[x]  2[]  UN[]      11 Extinct. and Inattention 0[]  1[]  2[]       TOTAL: 3     Past History   Past Medical History:  Diagnosis Date   Anxiety    Arthritis    CAD (coronary artery disease)    COPD (chronic obstructive pulmonary disease) (HCC)    Depression    Headache    migraines   Hypertension    Lumbar spinal stenosis    Obesity    Spondylolisthesis at L4-L5 level    Vertigo    Wears glasses     Past Surgical History:  Procedure Laterality Date   BREAST BIOPSY Left 07/2021   BREAST CYST EXCISION Left 02/2020   CARDIAC CATHETERIZATION     COLONOSCOPY W/ BIOPSIES AND POLYPECTOMY     MULTIPLE TOOTH EXTRACTIONS     TRANSFORAMINAL LUMBAR INTERBODY FUSION (TLIF) WITH PEDICLE SCREW FIXATION 1 LEVEL Left 09/16/2018   Procedure: LEFT-SIDED LUMBAR 4-5 TRANSFORAMINAL LUMBAR INTERBODY FUSION WITH INSTRUMENTATION AND ALLOGRAFT;  Surgeon:  Beuford Anes, MD;  Location: [facility] OR;  Service: Orthopedics;  Laterality: Left;   TUBAL LIGATION      Family History: Family History  Problem Relation Age of Onset   Kidney disease Mother    Seizures Mother    Heart failure Father    Colon cancer Neg Hx    Breast cancer Neg Hx    Pancreatic cancer Neg Hx    Stomach cancer Neg Hx    Esophageal cancer Neg Hx    Liver disease Neg Hx    Rectal cancer Neg Hx    Colon polyps Neg Hx    Cancer - Colon Neg Hx     Social History  reports that she has never smoked. She has never used smokeless tobacco. She reports that she does not drink alcohol and does not use drugs.  Allergies  Allergen Reactions   Other Nausea Only and Other (See Comments)    Darvocet   Azithromycin  Nausea And Vomiting   Ciprofloxacin Itching    Medications   Current Facility-Administered  Medications:    acetaminophen  (TYLENOL ) tablet 650 mg, 650 mg, Oral, Once, Randol Simmonds, MD  Current Outpatient Medications:    albuterol  (PROVENTIL  HFA;VENTOLIN  HFA) 108 (90 BASE) MCG/ACT inhaler, Inhale 1-2 puffs into the lungs every 6 (six) hours as needed for wheezing or shortness of breath., Disp: , Rfl:    albuterol  (PROVENTIL ) (2.5 MG/3ML) 0.083% nebulizer solution, Inhale 2.5 mg into the lungs every 4 (four) hours as needed for wheezing or shortness of breath. As needed, Disp: , Rfl: 3   amLODipine -olmesartan (AZOR) 10-40 MG tablet, Take 1 tablet by mouth daily., Disp: , Rfl:    aspirin 81 MG chewable tablet, Chew 81 mg by mouth daily. (Patient not taking: Reported on 10/05/2023), Disp: , Rfl:    atenolol (TENORMIN) 50 MG tablet, Take 50 mg by mouth daily., Disp: , Rfl:    Butalbital -Acetaminophen  50-300 MG TABS, SMARTSIG:1 Tablet(s) By Mouth Every 8-12 Hours PRN, Disp: , Rfl:    Butalbital -APAP-Caffeine 50-300-40 MG CAPS, Take 1 capsule by mouth 2 (two) times daily as needed., Disp: , Rfl:    Cholecalciferol (VITAMIN D PO), Take 1 tablet by mouth daily., Disp: , Rfl:     clonazePAM  (KLONOPIN ) 0.5 MG tablet, clonazepam  0.5 mg tablet  Take 1 tablet twice a day by oral route as needed for 30 days., Disp: , Rfl:    diclofenac  Sodium (VOLTAREN ) 1 % GEL, Apply 1 application topically 4 (four) times daily as needed. (Patient not taking: Reported on 10/05/2023), Disp: , Rfl:    dicyclomine  (BENTYL ) 10 MG capsule, Take 1 capsule (10 mg total) by mouth in the morning and at bedtime., Disp: 60 capsule, Rfl: 1   FARXIGA 5 MG TABS tablet, Take 5 mg by mouth daily. (Patient not taking: Reported on 10/05/2023), Disp: , Rfl:    hydrocortisone  (ANUSOL -HC) 2.5 % rectal cream, Place 1 Application rectally 2 (two) times daily as needed for hemorrhoids or anal itching., Disp: 30 g, Rfl: 1   linaclotide  (LINZESS ) 145 MCG CAPS capsule, Take 1 capsule (145 mcg total) by mouth daily before breakfast., Disp: 90 capsule, Rfl: 3   meclizine (ANTIVERT) 25 MG tablet, Take 1 tablet by mouth 2 (two) times daily as needed., Disp: , Rfl:    naloxone (NARCAN) 4 MG/0.1ML LIQD nasal spray kit, 1 spray., Disp: , Rfl:    omeprazole  (PRILOSEC) 20 MG capsule, Take 20 mg by mouth daily. Alternates with Pantoprazole  as needed., Disp: , Rfl:    oxyCODONE -acetaminophen  (PERCOCET) 10-325 MG tablet, Take 1 tablet by mouth 2 (two) times daily as needed., Disp: , Rfl:    Peppermint Oil (IBGARD) 90 MG CPCR, Take 2 capsules by mouth daily. Take 30-90 minutes before meals with water, Disp: 60 capsule, Rfl: 3   potassium chloride  (K-DUR) 10 MEQ tablet, Take 1 tablet (10 mEq total) by mouth daily. (Patient taking differently: Take 20 mEq by mouth daily.), Disp: 90 tablet, Rfl: 0   rosuvastatin  (CRESTOR ) 20 MG tablet, Take 20 mg by mouth at bedtime., Disp: , Rfl:    SYMBICORT  80-4.5 MCG/ACT inhaler, Use 2 puffs TWICE DAILY (120/4=30) (Patient taking differently: Inhale 2 puffs into the lungs 2 (two) times daily.), Disp: 10.2 g, Rfl: 0   topiramate  (TOPAMAX ) 100 MG tablet, Take 100 mg by mouth at bedtime., Disp: , Rfl:     topiramate  (TOPAMAX ) 50 MG tablet, Take 50 mg by mouth daily., Disp: , Rfl:    valsartan  (DIOVAN ) 160 MG tablet, Take 1 tablet (160 mg total) by mouth daily. (Patient not  taking: Reported on 10/05/2023), Disp: 30 tablet, Rfl: 2   vitamin B-12 (CYANOCOBALAMIN) 1000 MCG tablet, Take 1 tablet (1,000 mcg total) by mouth daily., Disp: 30 tablet, Rfl: 3  Vitals   Vitals:   11-11-2024 2330 10/23/24 0000 10/23/24 0100 10/23/24 0404  BP:  (!) 117/100 (!) 152/87 120/88  Pulse: (!) 58 64 (!) 59 (!) 53  Resp: 20 18 11 17   Temp:    97.9 F (36.6 C)  TempSrc:      SpO2: 97% 96% 97% 98%  Weight:      Height:        Body mass index is 38.47 kg/m.   Physical Exam   Constitutional: Appears well-developed and well-nourished.    Neurologic Examination    Neuro: Mental Status: Patient is awake, alert, oriented to person, place, month, year, and situation. Patient is able to give a clear and coherent history. No signs of aphasia or neglect Cranial Nerves: II: Visual Fields are full. Pupils are equal, round, and reactive to light.   III,IV, VI: EOMI without ptosis or diploplia.  V: Facial sensation with decreased to light touch on the right VII: Facial movement is symmetric.  VIII: hearing is intact to voice X: Uvula elevates symmetrically XII: tongue is midline without atrophy or fasciculations.  Motor: Tone is normal. Bulk is normal. 5/5 strength was present in all four extremities.  Sensory: Sensation is diminished to light touch in the right arm  Cerebellar: No clear ataxia on finger-nose-finger       Labs/Imaging/Neurodiagnostic studies   CBC:  Recent Labs  Lab 11-11-24 1850  WBC 4.4  NEUTROABS 2.2  HGB 13.3  HCT 41.5  MCV 85.9  PLT 224   Basic Metabolic Panel:  Lab Results  Component Value Date   NA 139 11-11-2024   K 3.8 11-Nov-2024   CO2 23 2024-11-11   GLUCOSE 106 (H) Nov 11, 2024   BUN 15 Nov 11, 2024   CREATININE 1.14 (H) 11-Nov-2024   CALCIUM  10.2 11/11/2024    GFRNONAA 52 (L) November 11, 2024   GFRAA >60 06/01/2020   Lipid Panel:  Lab Results  Component Value Date   LDLCALC 95 10/30/2010   HgbA1c:  Lab Results  Component Value Date   HGBA1C  11/15/2007    5.9 (NOTE)   The ADA recommends the following therapeutic goals for glycemic   control related to Hgb A1C measurement:   Goal of Therapy:   < 7.0% Hgb A1C   Action Suggested:  > 8.0% Hgb A1C   Ref:  Diabetes Care, 22, Suppl. 1, 1999   Urine Drug Screen:     Component Value Date/Time   LABOPIA NONE DETECTED 06/11/2010 1925   COCAINSCRNUR NONE DETECTED 06/11/2010 1925   COCAINSCRNUR NEG 12/04/2009 2019   LABBENZ NONE DETECTED 06/11/2010 1925   LABBENZ NEG 12/04/2009 2019   AMPHETMU NONE DETECTED 06/11/2010 1925   THCU NONE DETECTED 06/11/2010 1925   LABBARB  06/11/2010 1925    NONE DETECTED        DRUG SCREEN FOR MEDICAL PURPOSES ONLY.  IF CONFIRMATION IS NEEDED FOR ANY PURPOSE, NOTIFY LAB WITHIN 5 DAYS.        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  This explains her left facial weakness and dysarthria, but does not explain the right sided numbness.  It is possible that she has a second insult that is occult by MRI, but I think it is also possible that when she developed her current symptoms she began pain more tension and became aware of a previously established insult.  In any case, she will need further workup for secondary  stroke prevention.  RECOMMENDATIONS  - HgbA1c, fasting lipid panel - Frequent neuro checks - Echocardiogram - CTA head and neck - Prophylactic therapy-Antiplatelet med: Aspirin - dose 81mg  and plavix 75mg  daily  after 300mg  load  - Risk factor modification - Telemetry monitoring - PT consult, OT consult, Speech consult - Stroke team to follow  ______________________________________________________________________    Signed, Aisha Seals, MD [facility]

## 2024-10-23 NOTE — ED Notes (Signed)
 Called for diet/ meal tray

## 2024-10-23 NOTE — Evaluation (Signed)
 Physical Therapy Brief Evaluation and Discharge Note Patient Details Name: Catherine [name] MRN: [id number] DOB: [birth year] Today's Date: 10/23/2024   History of Present Illness  Pt is 68 year old presented to [facility] on  10/22/24 for slurred speech and rt face numbness.  PMH - htn, CAD, copd, back surgery, arthritis  Clinical Impression  Pt doing well with mobility and no further PT needed.  At baseline for mobility. Ready for dc from PT standpoint.        PT Assessment Patient does not need any further PT services  Assistance Needed at Discharge  PRN    Equipment Recommendations None recommended by PT  Recommendations for Other Services       Precautions/Restrictions Precautions Precautions: None Restrictions Weight Bearing Restrictions Per Provider Order: No        Mobility  Bed Mobility   Supine/Sidelying to sit: Modified independent (Device/Increased time) Sit to supine/sidelying: Modified independent (Device/Increased time)    Transfers Overall transfer level: Modified independent Equipment used: None                    Ambulation/Gait Ambulation/Gait assistance: Modified independent (Device/Increase time) Gait Distance (Feet): 200 Feet Assistive device: None Gait Pattern/deviations: Step-through pattern, Decreased stride length Gait Speed: [facility]    Home Activity Instructions    Stairs            Modified Rankin (Stroke Patients Only) Modified Rankin (Stroke Patients Only) Pre-Morbid Rankin Score: No symptoms Modified Rankin: No symptoms      Balance Overall balance assessment: Mild deficits observed, not formally tested                        Pertinent Vitals/Pain PT - Brief Vital Signs All Vital Signs Stable: Yes Pain Assessment Pain Assessment: No/denies pain     Home Living Family/patient expects to be discharged to:: Private residence Living Arrangements: Children Available Help at Discharge: Family;Available  PRN/intermittently Home Environment: Level entry   Home Equipment: Grab bars - tub/shower        Prior Function Level of Independence: Independent Comments: No assistive device    UE/LE Assessment   UE ROM/Strength/Tone/Coordination: WFL    LE ROM/Strength/Tone/Coordination: [facility]      Communication   Communication Communication: No apparent difficulties     Cognition Overall Cognitive Status: Appears within functional limits for tasks assessed/performed       General Comments      Exercises     Assessment/Plan    PT Problem List         PT Visit Diagnosis Other (comment)    No Skilled PT Patient at baseline level of functioning;Patient is modified independent with all activity/mobility   Co-evaluation                AMPAC 6 Clicks Help needed turning from your back to your side while in a flat bed without using bedrails?: None Help needed moving from lying on your back to sitting on the side of a flat bed without using bedrails?: None Help needed moving to and from a bed to a chair (including a wheelchair)?: None Help needed standing up from a chair using your arms (e.g., wheelchair or bedside chair)?: None Help needed to walk in hospital room?: None Help needed climbing 3-5 steps with a railing? : None 6 Click Score: 24      End of Session   Activity Tolerance: Patient tolerated treatment well Patient left: in bed;with  call bell/phone within reach;with family/visitor present Nurse Communication: Mobility status PT Visit Diagnosis: Other (comment)     Time: 8970-8954 PT Time Calculation (min) (ACUTE ONLY): 16 min  Charges:   PT Evaluation $PT Eval Low Complexity: 1 Low      [facility] PT Acute Rehabilitation Services Office [phone]   [place] ([facility]  10/23/2024, 10:52 AM

## 2024-10-23 NOTE — ED Notes (Signed)
Neuro MD team at [facility].

## 2024-10-23 NOTE — ED Notes (Signed)
[phone] Angeline Lesches and Fairy Gay (brother and sister) wanted an update about pt

## 2024-10-23 NOTE — Progress Notes (Addendum)
 STROKE TEAM PROGRESS NOTE    SIGNIFICANT HOSPITAL EVENTS 11/1 patient presented with acute onset of nausea and vomiting and some slurred speech and facial numbness  INTERIM HISTORY/SUBJECTIVE Catherine [name] is at the side.  Patient is laying in the bed in no apparent distress.  She states she still has some subtle sensation deficits on the left as well as subtle facial droop on the left MRI brain with right MCA infarct Dr. Rosemarie discussed with patient and Catherine [name] about the Librexia stroke trial study and was given some information.  They will read over information and discuss and let us  know what their decision is if they would like to enroll in the study CBC    Component Value Date/Time   WBC 4.4 10/22/2024 1850   RBC 4.83 10/22/2024 1850   HGB 13.3 10/22/2024 1850   HGB 13.0 11/20/2021 1005   HGB 12.1 07/01/2010 1309   HCT 41.5 10/22/2024 1850   HCT 36.8 07/01/2010 1309   PLT 224 10/22/2024 1850   PLT 244 11/20/2021 1005   PLT 209 07/01/2010 1309   MCV 85.9 10/22/2024 1850   MCV 82.1 07/01/2010 1309   MCH 27.5 10/22/2024 1850   MCHC 32.0 10/22/2024 1850   RDW 13.4 10/22/2024 1850   RDW 13.2 07/01/2010 1309   LYMPHSABS 1.5 10/22/2024 1850   LYMPHSABS 1.6 07/01/2010 1309   MONOABS 0.6 10/22/2024 1850   MONOABS 0.4 07/01/2010 1309   EOSABS 0.1 10/22/2024 1850   EOSABS 0.1 07/01/2010 1309   BASOSABS 0.0 10/22/2024 1850   BASOSABS 0.0 07/01/2010 1309    BMET    Component Value Date/Time   NA 139 10/22/2024 1850   NA 141 12/31/2022 1420   K 3.8 10/22/2024 1850   CL 105 10/22/2024 1850   CO2 23 10/22/2024 1850   GLUCOSE 106 (H) 10/22/2024 1850   BUN 15 10/22/2024 1850   BUN 18 12/31/2022 1420   CREATININE 1.14 (H) 10/22/2024 1850   CREATININE 0.98 11/20/2021 1005   CALCIUM  10.2 10/22/2024 1850   EGFR 40 (L) 12/31/2022 1420   GFRNONAA 52 (L) 10/22/2024 1850   GFRNONAA >60 11/20/2021 1005    IMAGING past 24 hours CT ANGIO HEAD NECK W WO CM Result Date: 10/23/2024 EXAM:  CTA HEAD AND NECK WITH AND WITHOUT 10/23/2024 07:12:55 AM TECHNIQUE: CTA of the head and neck was performed with and without the administration of intravenous contrast. 75 mL (iohexol  (OMNIPAQUE ) 350 MG/ML injection 75 mL IOHEXOL  350 MG/ML SOLN) was administered. Multiplanar 2D and/or 3D reformatted images are provided for review. Automated exposure control, iterative reconstruction, and/or weight based adjustment of the mA/kV was utilized to reduce the radiation dose to as low as reasonably achievable. Stenosis of the internal carotid arteries measured using NASCET criteria. COMPARISON: None available CLINICAL HISTORY: Stroke, follow up. FINDINGS: CTA NECK: AORTIC ARCH AND ARCH VESSELS: No dissection or arterial injury. No significant stenosis of the brachiocephalic or subclavian arteries. CERVICAL CAROTID ARTERIES: No dissection, arterial injury, or hemodynamically significant stenosis by NASCET criteria. CERVICAL VERTEBRAL ARTERIES: No dissection, arterial injury, or significant stenosis. LUNGS AND MEDIASTINUM: Unremarkable. SOFT TISSUES: Numerous calcifications scattered throughout the parotid glands bilaterally. There is mild mucosal disease within the right maxillary sinus. BONES: No acute abnormality. 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 The right A1 segment appears to be absent. The middle cerebral arteries  appear to be normal in caliber. There is no evidence of flow-limiting stenosis or a large vessel occlusion. No significant stenosis of the internal carotid arteries. No significant stenosis of the anterior cerebral arteries (except for the absent right A1 segment). POSTERIOR CIRCULATION: There is fetal type origin of the right posterior cerebral artery. No significant stenosis of the posterior cerebral arteries (except for the fetal origin of the  right PCA). No significant stenosis of the basilar artery. No significant stenosis of the vertebral arteries. No aneurysm. OTHER: No dural venous sinus thrombosis on this non-dedicated study. IMPRESSION: 1. 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 2. No large vessel occlusion or hemodynamically significant stenosis in the head or neck. 3. Fetal-type origin of the right posterior cerebral artery. 4. Right A1 segment appears absent (anatomic variant). 5. Mild mucosal disease in the right maxillary sinus. Electronically signed by: Evalene Coho MD 10/23/2024 07:41 AM EST RP Workstation: [id number]   MR BRAIN WO CONTRAST Result Date: 10/23/2024 EXAM: MRI BRAIN WITHOUT CONTRAST CURRENT DATE TECHNIQUE: Multiplanar multisequence MRI of the head/brain was performed without the administration of intravenous contrast. COMPARISON: CT of the head dated 10/22/2024 and MRI of the head dated 12/04/2009. CLINICAL HISTORY: FINDINGS: BRAIN AND VENTRICLES: There is restricted diffusion present within the right precentral gyrus consistent with acute non-hemorrhagic infarct. No intracranial hemorrhage. No mass. No midline shift. No hydrocephalus. There is extensive diffuse cerebral white matter disease. There are also mildly prominent perivascular spaces in the cerebral vertices bilaterally. The sella is unremarkable. Normal flow voids. ORBITS: No acute abnormality. SINUSES AND MASTOIDS: No acute abnormality. BONES AND SOFT TISSUES: Normal marrow signal. No acute soft tissue abnormality. IMPRESSION: 1. Acute non-hemorrhagic infarct in the right precentral gyrus. 2. Extensive diffuse cerebral white matter disease. Electronically signed by: Evalene Coho MD 10/23/2024 05:11 AM EST RP Workstation: [id number]   CT Head Wo Contrast Result Date: 10/22/2024 CLINICAL DATA:  Slurred speech and right-sided facial numbness. EXAM: CT HEAD WITHOUT CONTRAST TECHNIQUE: Contiguous axial images were  obtained from the base of the skull through the vertex without intravenous contrast. RADIATION DOSE REDUCTION: This exam was performed according to the departmental dose-optimization program which includes automated exposure control, adjustment of the mA and/or kV according to patient size and/or use of iterative reconstruction technique. COMPARISON:  July 02, 2023 FINDINGS: Brain: There is generalized cerebral atrophy with widening of the extra-axial spaces and ventricular dilatation. There are areas of decreased attenuation within the white matter tracts of the supratentorial brain, consistent with microvascular disease changes. Small, bilateral chronic basal ganglia lacunar infarcts are noted. Vascular: No hyperdense vessel or unexpected calcification. Skull: Normal. Negative for fracture or focal lesion. Sinuses/Orbits: There is an 11 mm posterior right maxillary sinus polyp versus mucous retention cyst. Other: Innumerable punctate calcifications are seen within the bilateral parotid glands. IMPRESSION: 1. Generalized cerebral atrophy with chronic white matter small vessel ischemic changes. 2. Small, bilateral chronic basal ganglia lacunar infarcts. 3. No acute intracranial abnormality. Electronically Signed   By: Suzen Dials M.D.   On: 10/22/2024 20:14    Vitals:   10/23/24 0404 10/23/24 0721 10/23/24 0800 10/23/24 0834  BP: 120/88 (!) 145/79 126/64   Pulse: (!) 53 (!) 59 (!) 57   Resp: 17 16 16    Temp: 97.9 F (36.6 C)   (!) 97.5 F (36.4 C)  TempSrc:    Oral  SpO2: 98% 99% 97%   Weight:      Height:  PHYSICAL EXAM General:  Alert, well-nourished, well-developed patient in no acute distress Psych:  Mood and affect appropriate for situation CV: Regular rate and rhythm on monitor Respiratory:  Regular, unlabored respirations on room air GI: Abdomen soft and nontender   NEURO:  Mental Status: AA&Ox3, patient is able to give clear and coherent history Speech/Language:  speech is without dysarthria or aphasia.  Naming, repetition, fluency, and comprehension intact.  Cranial Nerves:  II: PERRL. Visual fields full.  III, IV, VI: EOMI. Eyelids elevate symmetrically.  V: Diminished on left VII: Subtle left facial VIII: hearing intact to voice. IX, X: Palate elevates symmetrically. Phonation is normal.  KP:Dynloizm shrug 5/5. XII: tongue is midline without fasciculations. Motor: 5/5 strength to all muscle groups tested.  Tone: is normal and bulk is normal Sensation-diminished on left Coordination: FTN intact bilaterally, HKS: no ataxia in BLE.No drift.  Gait- deferred  Most Recent NIH   1a Level of Conscious.:  1b LOC Questions:  1c LOC Commands:  2 Best Gaze:  3 Visual:  4 Facial Palsy: 1 5a Motor Arm - left:  5b Motor Arm - Right:  6a Motor Leg - Left:  6b Motor Leg - Right:  7 Limb Ataxia:  8 Sensory: 1 9 Best Language:  10 Dysarthria:  11 Extinct. and Inatten.:  TOTAL: 2   ASSESSMENT/PLAN  [name] is a 68 y.o. 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 Small vessel disease. Atrophy. Small, bilateral chronic basal ganglia lacunar infarcts  CTA head & neck 1. 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 2. No large vessel occlusion or hemodynamically significant stenosis in the head or neck. 3. Fetal-type origin of the right posterior cerebral artery. 4. Right A1 segment appears absent (anatomic variant). MRI  Acute non-hemorrhagic infarct in the right precentral gyrus.  2D Echo ordered LDL 55 HgbA1c 5.5 Will need 30-day heart monitor after discharge VTE prophylaxis -SCDs aspirin 81 mg daily prior to admission, now on aspirin 81 mg daily and  clopidogrel 75 mg daily for 3 weeks and then Plavix alone. Therapy recommendations:  Pending Disposition: Pending  Hx of Stroke/TIA Small, bilateral chronic basal ganglia lacunar infarcts   Hypertension CAD Home meds: Amlodipine -olmesartan 10/40 mg, atenolol 50 mg Stable Blood Pressure Goal: SBP less than 160   Hyperlipidemia Home meds: Crestor  20 mg,  resumed in hospital LDL 55, goal < 70 Continue statin at discharge  Dysphagia Patient has post-stroke dysphagia, SLP consulted    Diet   Diet NPO time specified   Advance diet as tolerated  Other Stroke Risk Factors  ETOH use, alcohol level <15, advised to drink no more than 1 drink(s) a day Obesity, Body mass index is 38.47 kg/m., BMI >/= 30 associated with increased stroke risk, recommend weight loss, diet and exercise as appropriate  Coronary artery disease  Other Active Problems Anxiety and depression COPD Vertigo  Hospital day # 0   Karna Geralds DNP, ACNPC-AG  [facility]  I have personally obtained history,examined this patient, reviewed notes, independently viewed imaging studies, participated in medical decision making and plan of care.ROS completed by me personally and pertinent positives fully documented  I have made any additions or clarifications directly to the above note. Agree with note above.  Patient presented with facial numbness and slurred speech and MRI was right MCA branch embolic infarct of cryptogenic etiology.  Recommend continue ongoing stroke workup with echocardiogram and cardiac monitoring.  Aggressive risk factor modification.  Aspirin and Plavix for 3 weeks followed by Plavix alone.  Long discussion with patient and Catherine [name] and answered questions.  Patient may consider possible participation in the loop with the stroke traveling with given information to review and decide   I personally spent a total of 50 minutes in the care of the patient today including getting/reviewing  separately obtained history, performing a medically appropriate exam/evaluation, counseling and educating, placing orders, referring and communicating with other health care professionals, documenting clinical information in the EHR, independently interpreting results, and coordinating care.         Eather Popp, MD Medical Director [facility] Pager: [phone] 10/23/2024 3:48 PM   To contact Stroke Continuity provider, please refer to [email or web address]. After hours, contact General Neurology

## 2024-10-23 NOTE — ED Notes (Signed)
 Up to b/r with PT

## 2024-10-23 NOTE — Progress Notes (Signed)
 Echocardiogram 2D Echocardiogram has been performed.  Catherine [name] 10/23/2024, 3:02 PM

## 2024-10-24 DIAGNOSIS — I671 Cerebral aneurysm, nonruptured: Secondary | ICD-10-CM | POA: Diagnosis not present

## 2024-10-24 DIAGNOSIS — Z7902 Long term (current) use of antithrombotics/antiplatelets: Secondary | ICD-10-CM

## 2024-10-24 DIAGNOSIS — I639 Cerebral infarction, unspecified: Secondary | ICD-10-CM | POA: Diagnosis not present

## 2024-10-24 DIAGNOSIS — Z7982 Long term (current) use of aspirin: Secondary | ICD-10-CM

## 2024-10-24 LAB — LIPID PANEL
Cholesterol: 122 mg/dL (ref 0–200)
HDL: 50 mg/dL (ref >40)
LDL Cholesterol: 59 mg/dL (ref 0–99)
Total CHOL/HDL Ratio: 2.4 ratio
Triglycerides: 67 mg/dL (ref <150)
VLDL: 13 mg/dL (ref 0–40)

## 2024-10-24 LAB — HIV ANTIBODY (ROUTINE TESTING W REFLEX): HIV Screen 4th Generation wRfx: NONREACTIVE

## 2024-10-24 MED ORDER — PNEUMOCOCCAL 20-VAL CONJ VACC 0.5 ML IM SUSY
0.5000 mL | PREFILLED_SYRINGE | INTRAMUSCULAR | Status: AC
  Administered 2024-10-24: 0.5 mL via INTRAMUSCULAR
  Filled 2024-10-24: qty 0.5

## 2024-10-24 MED ORDER — CLONAZEPAM 0.5 MG PO TABS
0.5000 mg | ORAL_TABLET | Freq: Three times a day (TID) | ORAL | Status: DC | PRN
  Administered 2024-10-24: 0.5 mg via ORAL
  Filled 2024-10-24: qty 1

## 2024-10-24 MED ORDER — CLOPIDOGREL BISULFATE 75 MG PO TABS: 75.0000 mg | ORAL_TABLET | Freq: Every day | ORAL | 0 refills | Status: DC

## 2024-10-24 NOTE — Progress Notes (Signed)
 [facility] night cross cover note:   I was notified by the patient's RN of two updates to the patient's home medications.  The patient conveys that she no longer takes Topamax .  Consequently, I have discontinued current order for Topamax .  Additionally, the patient conveys that she is on clonazepam  3 times daily on a scheduled basis at home.  I subsequently resumed home clonazepam  3 times daily, but on a prn basis for anxiety.     Eva Pore, DO Hospitalist

## 2024-10-24 NOTE — Evaluation (Signed)
 Speech Language Pathology Evaluation Patient Details Name: Catherine [name] MRN: [id number] DOB: November 12, 1956 Today's Date: 10/24/2024 Time: 1120-1150 SLP Time Calculation (min) (ACUTE ONLY): 30 min  Problem List:  Patient Active Problem List   Diagnosis Date Noted   Acute ischemic stroke (HCC) 10/23/2024   Irritable bowel syndrome with both constipation and diarrhea 10/01/2020   Altered bowel habits 10/01/2020   Generalized abdominal pain 10/01/2020   Spinal stenosis 09/16/2018   Sinusitis, acute maxillary 12/30/2016   Dizziness 12/30/2016   Sinusitis 12/30/2016   Proteinuria 05/29/2016   Dysuria 04/03/2016   Soft tissue lesion of foot 04/03/2016   Obesity    Vaginal discharge 10/02/2015   Other bursal cyst, left ankle and foot 07/19/2015   Anxiety and depression 05/04/2015   LEUKOCYTOSIS 10/31/2010   HYPOKALEMIA 09/18/2010   GERD 09/18/2010   TRANSAMINASES, SERUM, ELEVATED 04/08/2010   DEPRESSION, PROLONGED 12/04/2009   ANEMIA 11/07/2009   CHEST PAIN, ATYPICAL 03/28/2009   SEDIMENTATION RATE, ELEVATED 01/05/2009   ABSCESS, TOOTH 12/27/2008   HEPATIC CYST 12/25/2008   Migraine 05/27/2007   HYPERLIPIDEMIA 03/10/2007   Coronary atherosclerosis 02/27/2007   ANXIETY DISORDER, GENERALIZED 10/12/2006   VERTIGO, BENIGN PAROXYSMAL POSITION 10/12/2006   Essential hypertension 10/12/2006   DENTAL CARIES 10/12/2006   INSOMNIA 10/12/2006   Past Medical History:  Past Medical History:  Diagnosis Date   Anxiety    Arthritis    CAD (coronary artery disease)    COPD (chronic obstructive pulmonary disease) (HCC)    Depression    Headache    migraines   Hypertension    Lumbar spinal stenosis    Obesity    Spondylolisthesis at L4-L5 level    Vertigo    Wears glasses    Past Surgical History:  Past Surgical History:  Procedure Laterality Date   BREAST BIOPSY Left 07/2021   BREAST CYST EXCISION Left 02/2020   CARDIAC CATHETERIZATION     COLONOSCOPY W/ BIOPSIES AND  POLYPECTOMY     MULTIPLE TOOTH EXTRACTIONS     TRANSFORAMINAL LUMBAR INTERBODY FUSION (TLIF) WITH PEDICLE SCREW FIXATION 1 LEVEL Left 09/16/2018   Procedure: LEFT-SIDED LUMBAR 4-5 TRANSFORAMINAL LUMBAR INTERBODY FUSION WITH INSTRUMENTATION AND ALLOGRAFT;  Surgeon: Beuford Anes, MD;  Location: [facility] OR;  Service: Orthopedics;  Laterality: Left;   TUBAL LIGATION     HPI:  Catherine [name] is a 68 y.o. female with medical history significant of CAD, HTN, and COPD who p/w L facial weakness iso acute R precentral gyrus embolic stroke.  ST consulted for speech/language cognitive assessment.   Assessment / Plan / Recommendation Clinical Impression  Pt would benefit from f/u from [facility] SLP prn if symptoms persist re: cognition impacting short-term memory/attention.  No family available to determine prior level of functioning.    Pt given portions of Cognistat with pt being Ox4, able to indicate adequate awareness of deficits (ie: thinking is different, visual deficits), problem solving (with simple tasks), repeat sentences/digits accurately, follow 2-3 step directives, describe picture using sentences, and name simple objects within a category and state similarities.  Pt was unable to recall objects after a time delay and/or maintain attention for dividing/subtracting simple calculation.  She was able to indicate current medications and reasoning behind medications, but stated she may need assistance remembering to take them and was requesting a pill sorter with days of week indicated for A when discharged.  Recommend ST f/u prn if pt does not return to baseline level of functioning.  Thank you for this consult.  SLP Assessment  SLP Recommendation/Assessment: All further Speech Language Pathology needs can be addressed in the next venue of care (prn) SLP Visit Diagnosis: Cognitive communication deficit (R41.841)     Assistance Recommended at Discharge  Other (comment) (TBD)  Functional Status Assessment  Patient has had a recent decline in their functional status and demonstrates the ability to make significant improvements in function in a reasonable and predictable amount of time.  Frequency and Duration Other (Comment) (evaluation only)         SLP Evaluation Cognition  Overall Cognitive Status: No family/caregiver present to determine baseline cognitive functioning Arousal/Alertness: Awake/alert Orientation Level: Oriented X4 Attention: Sustained Sustained Attention: Impaired Sustained Attention Impairment: Verbal complex;Functional complex Memory: Impaired Memory Impairment: Decreased recall of new information;Decreased short term memory;Retrieval deficit Decreased Short Term Memory: Verbal complex;Functional complex Awareness: Appears intact Problem Solving: Appears intact Safety/Judgment: Appears intact       Comprehension  Auditory Comprehension Overall Auditory Comprehension: Other (comment) (DTA, pt stated it is different than before) Commands: Within Functional Limits Conversation: Complex Interfering Components: Attention;Processing speed;Other (comment) (pt stated her thinking is different than before) EffectiveTechniques: Extra processing time Visual Recognition/Discrimination Discrimination: Not tested Reading Comprehension Reading Status: Not tested    Expression Expression Primary Mode of Expression: Verbal Verbal Expression Overall Verbal Expression: Appears within functional limits for tasks assessed Level of Generative/Spontaneous Verbalization: Conversation Repetition: No impairment Naming: Not tested Pragmatics: No impairment Interfering Components: Premorbid deficit Non-Verbal Means of Communication: Not applicable Written Expression Dominant Hand: Right Written Expression: Exceptions to [facility] Interfering Components: Other (comment) (R hand weakness)   Oral / Motor  Oral Motor/Sensory Function Overall Oral Motor/Sensory Function: Within  functional limits Motor Speech Overall Motor Speech: Appears within functional limits for tasks assessed Respiration: Within functional limits Phonation: Normal Resonance: Within functional limits Articulation: Within functional limitis Intelligibility: Intelligible Motor Planning: Within functional limits Motor Speech Errors: Not applicable            Pat Estanislao Harmon,M.S.,CCC-SLP 10/24/2024, 1:21 PM

## 2024-10-24 NOTE — TOC CM/SW Note (Signed)
 Transition of Care [facility]) - Inpatient Brief Assessment   Patient Details  Name: Catherine [name] MRN: [id number] Date of Birth: 08-11-1956  [facility] CM/SW Contact:    Sudie Erminio Deems, RN Phone Number: 10/24/2024, 1:47 PM   Clinical Narrative: Patient presented for right facial numbness and speech difficulty. PTA patient was independent from home with son. Patient states she has a PCP and insurance. Patient does not use any DME in the home. Patient will transition home via cab today. No further needs identified at this time.     Transition of Care Asessment: Insurance and Status: Insurance coverage has been reviewed Patient has primary care physician: Yes Home environment has been reviewed: reviewed Prior level of function:: independent Prior/Current Home Services: No current home services Social Drivers of Health Review: SDOH reviewed no interventions necessary Readmission risk has been reviewed: Yes Transition of care needs: no transition of care needs at this time

## 2024-10-24 NOTE — Progress Notes (Signed)
 STROKE TEAM PROGRESS NOTE    SIGNIFICANT HOSPITAL EVENTS 11/1 patient presented with acute onset of nausea and vomiting and some slurred speech and facial numbness  INTERIM HISTORY/SUBJECTIVE No family member is at the side.  Patient is laying in the bed in no apparent distress.  She states she still has some subtle sensation deficits on the left as well as subtle facial droop on the left Echocardiogram is unremarkable.  Patient decided not to participate in the sleep smart study CBC    Component Value Date/Time   WBC 4.4 10/22/2024 1850   RBC 4.83 10/22/2024 1850   HGB 13.3 10/22/2024 1850   HGB 13.0 11/20/2021 1005   HGB 12.1 07/01/2010 1309   HCT 41.5 10/22/2024 1850   HCT 36.8 07/01/2010 1309   PLT 224 10/22/2024 1850   PLT 244 11/20/2021 1005   PLT 209 07/01/2010 1309   MCV 85.9 10/22/2024 1850   MCV 82.1 07/01/2010 1309   MCH 27.5 10/22/2024 1850   MCHC 32.0 10/22/2024 1850   RDW 13.4 10/22/2024 1850   RDW 13.2 07/01/2010 1309   LYMPHSABS 1.5 10/22/2024 1850   LYMPHSABS 1.6 07/01/2010 1309   MONOABS 0.6 10/22/2024 1850   MONOABS 0.4 07/01/2010 1309   EOSABS 0.1 10/22/2024 1850   EOSABS 0.1 07/01/2010 1309   BASOSABS 0.0 10/22/2024 1850   BASOSABS 0.0 07/01/2010 1309    BMET    Component Value Date/Time   NA 139 10/22/2024 1850   NA 141 12/31/2022 1420   K 3.8 10/22/2024 1850   CL 105 10/22/2024 1850   CO2 23 10/22/2024 1850   GLUCOSE 106 (H) 10/22/2024 1850   BUN 15 10/22/2024 1850   BUN 18 12/31/2022 1420   CREATININE 1.14 (H) 10/22/2024 1850   CREATININE 0.98 11/20/2021 1005   CALCIUM  10.2 10/22/2024 1850   EGFR 40 (L) 12/31/2022 1420   GFRNONAA 52 (L) 10/22/2024 1850   GFRNONAA >60 11/20/2021 1005    IMAGING past 24 hours ECHOCARDIOGRAM COMPLETE Result Date: 10/23/2024    ECHOCARDIOGRAM REPORT   Patient Name:   Riannah Stagner Date of Exam: 10/23/2024 Medical Rec #:  [id number]     Height:       60.0 in Accession #:    [id number]    Weight:       197.0  lb Date of Birth:  [birth year]    BSA:          1.855 m Patient Age:    67 years      BP:           132/75 mmHg Patient Gender: F             HR:           55 bpm. Exam Location:  Inpatient Procedure: 2D Echo, Cardiac Doppler and Color Doppler (Both Spectral and Color            Flow Doppler were utilized during procedure). Indications:    Stroke I63.9  History:        Patient has no prior history of Echocardiogram examinations.                 Migraine and Stroke, Signs/Symptoms:Chest Pain; Risk                 Factors:Hypertension and Dyslipidemia.  Sonographer:    Thea Norlander RCS Referring Phys: MARSHA ADA IMPRESSIONS  1. Left ventricular ejection fraction, by estimation, is 60 to 65%. The left ventricle has normal function.  The left ventricle has no regional wall motion abnormalities. Left ventricular diastolic parameters are consistent with Grade I diastolic dysfunction (impaired relaxation).  2. Right ventricular systolic function is mildly reduced. The right ventricular size is normal.  3. The mitral valve is normal in structure. Trivial mitral valve regurgitation. No evidence of mitral stenosis.  4. The aortic valve is tricuspid. Aortic valve regurgitation is moderate. No aortic stenosis is present.  5. The inferior vena cava is normal in size with greater than 50% respiratory variability, suggesting right atrial pressure of 3 mmHg. Comparison(s): No prior Echocardiogram. FINDINGS  Left Ventricle: Left ventricular ejection fraction, by estimation, is 60 to 65%. The left ventricle has normal function. The left ventricle has no regional wall motion abnormalities. Strain was performed and the global longitudinal strain is indeterminate. The left ventricular internal cavity size was normal in size. There is no left ventricular hypertrophy. Left ventricular diastolic parameters are consistent with Grade I diastolic dysfunction (impaired relaxation). Normal left ventricular filling pressure. Right  Ventricle: The right ventricular size is normal. No increase in right ventricular wall thickness. Right ventricular systolic function is mildly reduced. Left Atrium: Left atrial size was normal in size. Right Atrium: Right atrial size was normal in size. Pericardium: There is no evidence of pericardial effusion. Mitral Valve: The mitral valve is normal in structure. Trivial mitral valve regurgitation. No evidence of mitral valve stenosis. Tricuspid Valve: The tricuspid valve is normal in structure. Tricuspid valve regurgitation is not demonstrated. No evidence of tricuspid stenosis. Aortic Valve: The aortic valve is tricuspid. Aortic valve regurgitation is moderate. No aortic stenosis is present. Aortic valve peak gradient measures 5.6 mmHg. Pulmonic Valve: The pulmonic valve was normal in structure. Pulmonic valve regurgitation is trivial. No evidence of pulmonic stenosis. Aorta: The aortic root and ascending aorta are structurally normal, with no evidence of dilitation. Venous: The inferior vena cava is normal in size with greater than 50% respiratory variability, suggesting right atrial pressure of 3 mmHg. IAS/Shunts: No atrial level shunt detected by color flow Doppler. Additional Comments: 3D was performed not requiring image post processing on an independent workstation and was indeterminate.  LEFT VENTRICLE PLAX 2D LVIDd:         4.50 cm   Diastology LVIDs:         3.20 cm   LV e' medial:    4.57 cm/s LV PW:         0.90 cm   LV E/e' medial:  10.5 LV IVS:        0.80 cm   LV e' lateral:   7.29 cm/s LVOT diam:     2.10 cm   LV E/e' lateral: 6.6 LV SV:         72 LV SV Index:   39 LVOT Area:     3.46 cm  RIGHT VENTRICLE             IVC RV S prime:     18.30 cm/s  IVC diam: 1.40 cm TAPSE (M-mode): 1.6 cm LEFT ATRIUM             Index        RIGHT ATRIUM          Index LA diam:        2.80 cm 1.51 cm/m   RA Area:     8.54 cm LA Vol (A2C):   34.6 ml 18.66 ml/m  RA Volume:   14.40 ml 7.76 ml/m LA Vol (A4C):  46.2 ml 24.91 ml/m LA Biplane Vol: 42.4 ml 22.86 ml/m  AORTIC VALVE AV Area (Vmax): 2.71 cm AV Vmax:        118.00 cm/s AV Peak Grad:   5.6 mmHg LVOT Vmax:      92.42 cm/s LVOT Vmean:     55.840 cm/s LVOT VTI:       0.208 m  AORTA Ao Root diam: 3.30 cm Ao Asc diam:  3.20 cm MITRAL VALVE               TRICUSPID VALVE MV Area (PHT): 4.21 cm    TR Peak grad:   404496.0 mmHg MV Decel Time: 180 msec    TR Vmax:        31800.00 cm/s MV E velocity: 48.20 cm/s MV A velocity: 84.90 cm/s  SHUNTS MV E/A ratio:  0.57        Systemic VTI:  0.21 m                            Systemic Diam: 2.10 cm Vishnu Priya Mallipeddi Electronically signed by Diannah Late Mallipeddi Signature Date/Time: 10/23/2024/3:57:37 PM    Final     Vitals:   10/24/24 0207 10/24/24 0400 10/24/24 0851 10/24/24 1100  BP: 126/77 (!) 141/73 (!) 138/105 121/71  Pulse: (!) 44 (!) 56 61 (!) 52  Resp: 18 20  18   Temp: 98.3 F (36.8 C) 98.4 F (36.9 C) 98.6 F (37 C) 98.1 F (36.7 C)  TempSrc: Oral Oral  Oral  SpO2: 100% 98% 99% 99%  Weight:  88 kg    Height:         PHYSICAL EXAM General:  Alert, well-nourished, well-developed patient in no acute distress Psych:  Mood and affect appropriate for situation CV: Regular rate and rhythm on monitor Respiratory:  Regular, unlabored respirations on room air GI: Abdomen soft and nontender   NEURO:  Mental Status: AA&Ox3, patient is able to give clear and coherent history Speech/Language: speech is without dysarthria or aphasia.  Naming, repetition, fluency, and comprehension intact.  Cranial Nerves:  II: PERRL. Visual fields full.  III, IV, VI: EOMI. Eyelids elevate symmetrically.  V: Diminished on left VII: Subtle left facial VIII: hearing intact to voice. IX, X: Palate elevates symmetrically. Phonation is normal.  KP:Dynloizm shrug 5/5. XII: tongue is midline without fasciculations. Motor: 5/5 strength to all muscle groups tested.  Tone: is normal and bulk is  normal Sensation-diminished on left Coordination: FTN intact bilaterally, HKS: no ataxia in BLE.No drift.  Gait- deferred  Most Recent NIH   1a Level of Conscious.:  1b LOC Questions:  1c LOC Commands:  2 Best Gaze:  3 Visual:  4 Facial Palsy: 1 5a Motor Arm - left:  5b Motor Arm - Right:  6a Motor Leg - Left:  6b Motor Leg - Right:  7 Limb Ataxia:  8 Sensory: 1 9 Best Language:  10 Dysarthria:  11 Extinct. and Inatten.:  TOTAL: 2   ASSESSMENT/PLAN  Ms. Raizy Auzenne is a 68 y.o. 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 Small vessel disease. Atrophy. Small, bilateral chronic basal ganglia lacunar infarcts  CTA head & neck 1. 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 2. No large vessel occlusion or hemodynamically significant stenosis in the head or neck. 3. Fetal-type origin of the right posterior cerebral artery. 4. Right A1 segment appears absent (anatomic variant). MRI  Acute non-hemorrhagic infarct in the right precentral gyrus.  2D Echo ordered LDL 55 HgbA1c 5.5 Will need 30-day heart monitor after discharge VTE prophylaxis -SCDs aspirin 81 mg daily prior to admission, now on aspirin 81 mg daily and clopidogrel 75 mg daily for 3 weeks and then Plavix alone. Therapy recommendations:  Pending Disposition: Pending  Hx of Stroke/TIA Small, bilateral chronic basal ganglia lacunar infarcts   Hypertension CAD Home meds: Amlodipine -olmesartan 10/40 mg, atenolol 50 mg Stable Blood Pressure Goal: SBP less than 160   Hyperlipidemia Home meds: Crestor  20 mg,  resumed in hospital LDL 55, goal < 70 Continue statin at discharge  Dysphagia Patient has post-stroke dysphagia, SLP consulted     Diet   Diet regular Room service appropriate? Yes; Fluid consistency: Thin   Advance diet as tolerated  Other Stroke Risk Factors  ETOH use, alcohol level <15, advised to drink no more than 1 drink(s) a day Obesity, Body mass index is 37.89 kg/m., BMI >/= 30 associated with increased stroke risk, recommend weight loss, diet and exercise as appropriate  Coronary artery disease  Other Active Problems Anxiety and depression COPD Vertigo  Hospital day # 1    Patient presented with facial numbness and slurred speech and MRI was right MCA branch embolic infarct of cryptogenic etiology.  Recommend    Aspirin and Plavix for 3 weeks followed by Plavix alone.  Long discussion with patient and  answered questions.  Follow-up as outpatient in stroke clinic with nurse practitioner in 2 months.        Eather Popp, MD Medical Director [facility] Pager: [phone] 10/24/2024 12:36 PM   To contact Stroke Continuity provider, please refer to [email or web address]. After hours, contact General Neurology

## 2024-10-24 NOTE — Discharge Summary (Signed)
 Physician Discharge Summary   Patient: Catherine [name] MRN: [id number] DOB: 12-Jul-1956  Admit date:     10/22/2024  Discharge date: 10/24/24  Discharge Physician: Carliss LELON Canales   PCP: Leron Millman, NP   Recommendations at discharge:    Pt to be discharged home.   If you experience worsening fever, chills, chest pain, shortness of breath, or other concerning symptoms, please call your PCP or go to the emergency department immediately.  Discharge Diagnoses: Principal Problem:   Acute ischemic stroke [facility])  Resolved Problems:   * No resolved hospital problems. *   Hospital Course:  68 y.o. female with medical history significant of CAD, HTN, and COPD who p/w L facial weakness iso acute R precentral gyrus embolic stroke.   The patient reported experiencing symptoms starting on "Sunday morning after staying up late the previous night watching the home shopping channel. Upon waking, the patient washed their face, ate breakfast, and took a nap. After the nap, the patient felt unusual and began vomiting plain water. The patient noted a significant slurring of speech around 5 PM, which prompted concern about a possible stroke. The patient contacted their son and daughter, who had difficulty understanding the patient due to the speech slurring. The patient also noticed a slight shift in facial expression on both sides. The patient reported feeling sluggish but denied any previous history of stroke or seizures. The patient's son brought them to the hospital.   In the ED, pt AFVSS. Labs unremarkable. MRI brain WO showed acute non-hemorrhagic infarct in the right precentral gyrus. EDP consulted Neurology and requested medicine admission.  Assessment and Plan:  Acute right precentral gyrus embolic stroke - Noted on MRI.  Neurology consulted and following closely.  Improvement in left sensation deficits as well as left facial droop compared with presentation.  Evaluated by PT/OT with no  recommended follow-up or home health needs.  Recommendations to initiate aspirin plus Plavix for 21 days then discontinue aspirin and remain on Plavix thereafter.  Continue rosuvastatin home dose upon discharge.  30-day heart monitor will be mailed to the patient at home after discharge.  Recommend following up with neurology in the outpatient setting.  Dr. Sethi discussed with patient and daughter about the Librexia stroke trial study and was given some information.  They will read over information and discuss and let us know what their decision is if they would like to enroll in the study   Cerebral aneurysm - CTA noting 2 mm aneurysm projecting posterior from the anterior communicating artery.  Could follow-up with neurology in the outpatient setting.  Hypertension/CAD - Resume amlodipine/olmesartan 10/40 mg, atenolol 50 mg.  DAPT and statin as above.    Consultants: Neurology Procedures performed: None Disposition: Home Diet recommendation:  Discharge Diet Orders (From admission, onward)     Start     Ordered   10/24/24 0000  Diet - low sodium heart healthy        11" /03/25 1221           Cardiac diet  DISCHARGE MEDICATION: Allergies as of 10/24/2024       Reactions   Other Nausea Only, Other (See Comments)   Darvocet   Azithromycin  Nausea And Vomiting   Ciprofloxacin Itching        Medication List     STOP taking these medications    omeprazole  20 MG capsule Commonly known as: PRILOSEC   topiramate  50 MG tablet Commonly known as: TOPAMAX        TAKE  these medications    albuterol  (2.5 MG/3ML) 0.083% nebulizer solution Commonly known as: PROVENTIL  Inhale 2.5 mg into the lungs every 4 (four) hours as needed for wheezing or shortness of breath. As needed   albuterol  108 (90 Base) MCG/ACT inhaler Commonly known as: VENTOLIN  HFA Inhale 1-2 puffs into the lungs every 6 (six) hours as needed for wheezing or shortness of breath.   amLODipine -olmesartan 10-40  MG tablet Commonly known as: AZOR Take 1 tablet by mouth daily.   aspirin 81 MG chewable tablet Chew 81 mg by mouth daily.   atenolol 50 MG tablet Commonly known as: TENORMIN Take 50 mg by mouth daily.   Butalbital -APAP-Caffeine 50-300-40 MG Caps Take 1 capsule by mouth 2 (two) times daily as needed.   clonazePAM  0.5 MG tablet Commonly known as: KLONOPIN  Take 0.5 mg by mouth in the morning, at noon, and at bedtime.   clopidogrel 75 MG tablet Commonly known as: PLAVIX Take 1 tablet (75 mg total) by mouth daily. Start taking on: October 25, 2024   cyanocobalamin 1000 MCG tablet Commonly known as: VITAMIN B12 Take 1 tablet (1,000 mcg total) by mouth daily.   EYE SUPPORT PO Take 1 tablet by mouth daily.   IBgard 90 MG Cpcr Generic drug: Peppermint Oil Take 2 capsules by mouth daily. Take 30-90 minutes before meals with water What changed:  when to take this reasons to take this   linaclotide  145 MCG Caps capsule Commonly known as: LINZESS  Take 1 capsule (145 mcg total) by mouth daily before breakfast.   meclizine 25 MG tablet Commonly known as: ANTIVERT Take 1 tablet by mouth 2 (two) times daily as needed.   Myrbetriq 50 MG Tb24 tablet Generic drug: mirabegron ER Take 50 mg by mouth daily.   Narcan 4 MG/0.1ML Liqd nasal spray kit Generic drug: naloxone 1 spray.   oxyCODONE -acetaminophen  10-325 MG tablet Commonly known as: PERCOCET Take 1 tablet by mouth 2 (two) times daily as needed.   potassium citrate 10 MEQ (1080 MG) SR tablet Commonly known as: UROCIT-K Take 10 mEq by mouth daily.   Qulipta 30 MG Tabs Generic drug: Atogepant Take 1 tablet by mouth daily.   rosuvastatin  20 MG tablet Commonly known as: CRESTOR  Take 20 mg by mouth daily.   Symbicort  80-4.5 MCG/ACT inhaler Generic drug: budesonide -formoterol  Use 2 puffs TWICE DAILY (120/4=30)   VITAMIN D PO Take 1 tablet by mouth daily.               Durable Medical Equipment  (From  admission, onward)           Start     Ordered   10/24/24 0000  For home use only DME Nebulizer machine       Question Answer Comment  Patient needs a nebulizer to treat with the following condition COPD (chronic obstructive pulmonary disease) (HCC)   Length of Need 12 Months   Additional equipment included Administration kit   Additional equipment included Filter      10/24/24 1221             Discharge Exam: Filed Weights   10/22/24 1836 10/24/24 0400  Weight: 89.4 kg 88 kg    GENERAL:  Alert, pleasant, no acute distress  HEENT:  EOMI CARDIOVASCULAR: Regular bradycardic RESPIRATORY:  Clear to auscultation, no wheezing, rales, or rhonchi GASTROINTESTINAL:  Soft, nontender, nondistended EXTREMITIES:  No LE edema bilaterally NEURO: Very subtle left facial droop, no other focal deficit appreciated  SKIN:  No rashes noted  PSYCH:  Appropriate mood and affect     Condition at discharge: improving  The results of significant diagnostics from this hospitalization (including imaging, microbiology, ancillary and laboratory) are listed below for reference.   Imaging Studies: ECHOCARDIOGRAM COMPLETE Result Date: 10/23/2024    ECHOCARDIOGRAM REPORT   Patient Name:   Keely Drennan Date of Exam: 10/23/2024 Medical Rec #:  [id number]     Height:       60.0 in Accession #:    [id number]    Weight:       197.0 lb Date of Birth:  May 21, 1956    BSA:          1.855 m Patient Age:    67 years      BP:           132/75 mmHg Patient Gender: F             HR:           55 bpm. Exam Location:  Inpatient Procedure: 2D Echo, Cardiac Doppler and Color Doppler (Both Spectral and Color            Flow Doppler were utilized during procedure). Indications:    Stroke I63.9  History:        Patient has no prior history of Echocardiogram examinations.                 Migraine and Stroke, Signs/Symptoms:Chest Pain; Risk                 Factors:Hypertension and Dyslipidemia.  Sonographer:    Thea Norlander RCS Referring Phys: MARSHA ADA IMPRESSIONS  1. Left ventricular ejection fraction, by estimation, is 60 to 65%. The left ventricle has normal function. The left ventricle has no regional wall motion abnormalities. Left ventricular diastolic parameters are consistent with Grade I diastolic dysfunction (impaired relaxation).  2. Right ventricular systolic function is mildly reduced. The right ventricular size is normal.  3. The mitral valve is normal in structure. Trivial mitral valve regurgitation. No evidence of mitral stenosis.  4. The aortic valve is tricuspid. Aortic valve regurgitation is moderate. No aortic stenosis is present.  5. The inferior vena cava is normal in size with greater than 50% respiratory variability, suggesting right atrial pressure of 3 mmHg. Comparison(s): No prior Echocardiogram. FINDINGS  Left Ventricle: Left ventricular ejection fraction, by estimation, is 60 to 65%. The left ventricle has normal function. The left ventricle has no regional wall motion abnormalities. Strain was performed and the global longitudinal strain is indeterminate. The left ventricular internal cavity size was normal in size. There is no left ventricular hypertrophy. Left ventricular diastolic parameters are consistent with Grade I diastolic dysfunction (impaired relaxation). Normal left ventricular filling pressure. Right Ventricle: The right ventricular size is normal. No increase in right ventricular wall thickness. Right ventricular systolic function is mildly reduced. Left Atrium: Left atrial size was normal in size. Right Atrium: Right atrial size was normal in size. Pericardium: There is no evidence of pericardial effusion. Mitral Valve: The mitral valve is normal in structure. Trivial mitral valve regurgitation. No evidence of mitral valve stenosis. Tricuspid Valve: The tricuspid valve is normal in structure. Tricuspid valve regurgitation is not demonstrated. No evidence of tricuspid stenosis.  Aortic Valve: The aortic valve is tricuspid. Aortic valve regurgitation is moderate. No aortic stenosis is present. Aortic valve peak gradient measures 5.6 mmHg. Pulmonic Valve: The pulmonic valve was normal in structure. Pulmonic valve regurgitation is trivial. No evidence of pulmonic  stenosis. Aorta: The aortic root and ascending aorta are structurally normal, with no evidence of dilitation. Venous: The inferior vena cava is normal in size with greater than 50% respiratory variability, suggesting right atrial pressure of 3 mmHg. IAS/Shunts: No atrial level shunt detected by color flow Doppler. Additional Comments: 3D was performed not requiring image post processing on an independent workstation and was indeterminate.  LEFT VENTRICLE PLAX 2D LVIDd:         4.50 cm   Diastology LVIDs:         3.20 cm   LV e' medial:    4.57 cm/s LV PW:         0.90 cm   LV E/e' medial:  10.5 LV IVS:        0.80 cm   LV e' lateral:   7.29 cm/s LVOT diam:     2.10 cm   LV E/e' lateral: 6.6 LV SV:         72 LV SV Index:   39 LVOT Area:     3.46 cm  RIGHT VENTRICLE             IVC RV S prime:     18.30 cm/s  IVC diam: 1.40 cm TAPSE (M-mode): 1.6 cm LEFT ATRIUM             Index        RIGHT ATRIUM          Index LA diam:        2.80 cm 1.51 cm/m   RA Area:     8.54 cm LA Vol (A2C):   34.6 ml 18.66 ml/m  RA Volume:   14.40 ml 7.76 ml/m LA Vol (A4C):   46.2 ml 24.91 ml/m LA Biplane Vol: 42.4 ml 22.86 ml/m  AORTIC VALVE AV Area (Vmax): 2.71 cm AV Vmax:        118.00 cm/s AV Peak Grad:   5.6 mmHg LVOT Vmax:      92.42 cm/s LVOT Vmean:     55.840 cm/s LVOT VTI:       0.208 m  AORTA Ao Root diam: 3.30 cm Ao Asc diam:  3.20 cm MITRAL VALVE               TRICUSPID VALVE MV Area (PHT): 4.21 cm    TR Peak grad:   404496.0 mmHg MV Decel Time: 180 msec    TR Vmax:        31800.00 cm/s MV E velocity: 48.20 cm/s MV A velocity: 84.90 cm/s  SHUNTS MV E/A ratio:  0.57        Systemic VTI:  0.21 m                            Systemic Diam: 2.10  cm Vishnu Priya Mallipeddi Electronically signed by Diannah Late Mallipeddi Signature Date/Time: 10/23/2024/3:57:37 PM    Final    CT ANGIO HEAD NECK W WO CM Result Date: 10/23/2024 EXAM: CTA HEAD AND NECK WITH AND WITHOUT 10/23/2024 07:12:55 AM TECHNIQUE: CTA of the head and neck was performed with and without the administration of intravenous contrast. 75 mL (iohexol  (OMNIPAQUE ) 350 MG/ML injection 75 mL IOHEXOL  350 MG/ML SOLN) was administered. Multiplanar 2D and/or 3D reformatted images are provided for review. Automated exposure control, iterative reconstruction, and/or weight based adjustment of the mA/kV was utilized to reduce the radiation dose to as low as reasonably achievable. Stenosis of the internal carotid  arteries measured using NASCET criteria. COMPARISON: None available CLINICAL HISTORY: Stroke, follow up. FINDINGS: CTA NECK: AORTIC ARCH AND ARCH VESSELS: No dissection or arterial injury. No significant stenosis of the brachiocephalic or subclavian arteries. CERVICAL CAROTID ARTERIES: No dissection, arterial injury, or hemodynamically significant stenosis by NASCET criteria. CERVICAL VERTEBRAL ARTERIES: No dissection, arterial injury, or significant stenosis. LUNGS AND MEDIASTINUM: Unremarkable. SOFT TISSUES: Numerous calcifications scattered throughout the parotid glands bilaterally. There is mild mucosal disease within the right maxillary sinus. BONES: No acute abnormality. 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 The right A1 segment appears to be absent. The middle cerebral arteries appear to be normal in caliber. There is no evidence of flow-limiting stenosis or a large vessel occlusion. No significant stenosis of the internal carotid arteries. No significant stenosis of the anterior cerebral arteries (except for the absent right A1  segment). POSTERIOR CIRCULATION: There is fetal type origin of the right posterior cerebral artery. No significant stenosis of the posterior cerebral arteries (except for the fetal origin of the right PCA). No significant stenosis of the basilar artery. No significant stenosis of the vertebral arteries. No aneurysm. OTHER: No dural venous sinus thrombosis on this non-dedicated study. IMPRESSION: 1. 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 2. No large vessel occlusion or hemodynamically significant stenosis in the head or neck. 3. Fetal-type origin of the right posterior cerebral artery. 4. Right A1 segment appears absent (anatomic variant). 5. Mild mucosal disease in the right maxillary sinus. Electronically signed by: Evalene Coho MD 10/23/2024 07:41 AM EST RP Workstation: [id number]   MR BRAIN WO CONTRAST Result Date: 10/23/2024 EXAM: MRI BRAIN WITHOUT CONTRAST CURRENT DATE TECHNIQUE: Multiplanar multisequence MRI of the head/brain was performed without the administration of intravenous contrast. COMPARISON: CT of the head dated 10/22/2024 and MRI of the head dated 12/04/2009. CLINICAL HISTORY: FINDINGS: BRAIN AND VENTRICLES: There is restricted diffusion present within the right precentral gyrus consistent with acute non-hemorrhagic infarct. No intracranial hemorrhage. No mass. No midline shift. No hydrocephalus. There is extensive diffuse cerebral white matter disease. There are also mildly prominent perivascular spaces in the cerebral vertices bilaterally. The sella is unremarkable. Normal flow voids. ORBITS: No acute abnormality. SINUSES AND MASTOIDS: No acute abnormality. BONES AND SOFT TISSUES: Normal marrow signal. No acute soft tissue abnormality. IMPRESSION: 1. Acute non-hemorrhagic infarct in the right precentral gyrus. 2. Extensive diffuse cerebral white matter disease. Electronically signed by: Evalene Coho MD 10/23/2024 05:11 AM EST RP Workstation:  [id number]   CT Head Wo Contrast Result Date: 10/22/2024 CLINICAL DATA:  Slurred speech and right-sided facial numbness. EXAM: CT HEAD WITHOUT CONTRAST TECHNIQUE: Contiguous axial images were obtained from the base of the skull through the vertex without intravenous contrast. RADIATION DOSE REDUCTION: This exam was performed according to the departmental dose-optimization program which includes automated exposure control, adjustment of the mA and/or kV according to patient size and/or use of iterative reconstruction technique. COMPARISON:  July 02, 2023 FINDINGS: Brain: There is generalized cerebral atrophy with widening of the extra-axial spaces and ventricular dilatation. There are areas of decreased attenuation within the white matter tracts of the supratentorial brain, consistent with microvascular disease changes. Small, bilateral chronic basal ganglia lacunar infarcts are noted. Vascular: No hyperdense vessel or unexpected calcification. Skull: Normal. Negative for fracture or focal lesion. Sinuses/Orbits: There is an 11 mm posterior right  maxillary sinus polyp versus mucous retention cyst. Other: Innumerable punctate calcifications are seen within the bilateral parotid glands. IMPRESSION: 1. Generalized cerebral atrophy with chronic white matter small vessel ischemic changes. 2. Small, bilateral chronic basal ganglia lacunar infarcts. 3. No acute intracranial abnormality. Electronically Signed   By: Suzen Dials M.D.   On: 10/22/2024 20:14    Microbiology: Results for orders placed or performed during the hospital encounter of 02/13/20  Urine Culture     Status: Abnormal   Collection Time: 02/14/20  2:46 AM   Specimen: Urine, Random  Result Value Ref Range Status   Specimen Description URINE, RANDOM  Final   Special Requests NONE  Final   Culture MULTIPLE SPECIES PRESENT, SUGGEST RECOLLECTION (A)  Final   Report Status 02/14/2020 FINAL  Final    Labs: CBC: Recent Labs  Lab  10/22/24 1850  WBC 4.4  NEUTROABS 2.2  HGB 13.3  HCT 41.5  MCV 85.9  PLT 224   Basic Metabolic Panel: Recent Labs  Lab 10/22/24 1850  NA 139  K 3.8  CL 105  CO2 23  GLUCOSE 106*  BUN 15  CREATININE 1.14*  CALCIUM  10.2   Liver Function Tests: Recent Labs  Lab 10/22/24 1850  AST 34  ALT 20  ALKPHOS 75  BILITOT 0.6  PROT 7.8  ALBUMIN 4.0   CBG: Recent Labs  Lab 10/22/24 1827  GLUCAP 110*    Discharge time spent: 35 minutes.  Length of inpatient stay: 1 days  Signed: Carliss LELON Canales, DO [facility] 10/24/2024

## 2024-10-25 ENCOUNTER — Encounter: Payer: Self-pay | Admitting: *Deleted

## 2024-10-25 ENCOUNTER — Other Ambulatory Visit: Payer: Self-pay | Admitting: Cardiology

## 2024-10-25 DIAGNOSIS — I639 Cerebral infarction, unspecified: Secondary | ICD-10-CM

## 2024-10-25 NOTE — Progress Notes (Signed)
 Patient enrolled for [facility] [employer] to ship a 30 day cardiac event monitor to her address on file. Letter with instructions mailed to patient. Dr. Court to read.

## 2024-10-25 NOTE — Progress Notes (Signed)
 30d monitor ordered for stroke. Dr. Court to read

## 2024-10-31 ENCOUNTER — Telehealth: Payer: Self-pay | Admitting: Cardiovascular Disease

## 2024-10-31 NOTE — Telephone Encounter (Signed)
  1. Is this related to a heart monitor you are wearing?  (If the patient says no, please ask     if they are caling about ICD/pacemaker.) Heart Monitor  2. What is your issue?? Received in the mail and doesn't know how to put it on   Please route to covering RN/CMA/RMA for results. Route to monitor technicians or your monitor tech representative for your site for any technical concerns

## 2024-11-04 NOTE — Telephone Encounter (Signed)
 Patient was calling back to speak with the nurse in regards to the heart monitor. Please advise

## 2024-11-07 NOTE — Telephone Encounter (Signed)
 Patient scheduled to come to our [employer] address to have her [employer] and application on Friday, 11/11/2024, 9:30 AM.

## 2024-11-07 NOTE — Telephone Encounter (Signed)
  Patient is returning call. She said to call her at [phone]

## 2024-11-11 ENCOUNTER — Ambulatory Visit

## 2024-11-11 NOTE — Telephone Encounter (Signed)
 Patient rescheduled to Wednesday, 11/16/24, at 9:30 AM.

## 2024-11-11 NOTE — Telephone Encounter (Signed)
 Patients calling back with questions. Wasn't clear on when she needs to come to the office to have her monitor put on. From the not I said today at 9:30 but she said that wasn't right. Please advise

## 2024-11-16 ENCOUNTER — Ambulatory Visit: Attending: Cardiovascular Disease

## 2024-11-16 DIAGNOSIS — I639 Cerebral infarction, unspecified: Secondary | ICD-10-CM

## 2024-11-28 ENCOUNTER — Telehealth: Payer: Self-pay | Admitting: Cardiovascular Disease

## 2024-11-28 NOTE — Telephone Encounter (Signed)
 Spoke with the patient and advised that she should wear the monitor for 30 days. Patient verbalized understanding

## 2024-11-28 NOTE — Telephone Encounter (Signed)
 Patient would like to clarify when she needs to remove heart monitor. Please advise.

## 2024-12-07 ENCOUNTER — Telehealth: Payer: Self-pay | Admitting: Cardiovascular Disease

## 2024-12-07 NOTE — Telephone Encounter (Signed)
 Pt calling to ask if office has more adhesive patches for her heart monitor. She wants to know if they can be mailed. Please advise.

## 2024-12-09 ENCOUNTER — Telehealth: Payer: Self-pay | Admitting: Cardiovascular Disease

## 2024-12-09 NOTE — Telephone Encounter (Signed)
 Patient calling to see if the dr can prescribe something for her headache. Please advise

## 2024-12-09 NOTE — Telephone Encounter (Signed)
 S/w the patient and informed her that the Cardiologists do not routinely prescribe any meds for headache and told her to ask her PCP. She said she went to her PCP and they said no. I asked her if she can take tylenol - she said that it burns her stomach. I asked her what she was wanting to take. She said that before she had a stroke, she would take Tylenol  with codeine . I told her that I really doubt they will send that in for her. But she was really insistent that I ask. Informed her that I would ask, but not to get her hopes up. She verbalized understanding.

## 2024-12-09 NOTE — Telephone Encounter (Signed)
 Court Dorn PARAS, MD to [facility] Triage (Selected Message)     12/09/24  3:35 PM PCP would need to prescribe    S/w the patient and gave her the information that Dr Court said she would need to get it from her PCP. She verbalized understanding.

## 2024-12-13 ENCOUNTER — Ambulatory Visit: Admitting: Emergency Medicine

## 2024-12-21 ENCOUNTER — Ambulatory Visit: Payer: Self-pay | Admitting: Cardiovascular Disease

## 2024-12-21 DIAGNOSIS — I6389 Other cerebral infarction: Secondary | ICD-10-CM | POA: Diagnosis not present

## 2024-12-26 ENCOUNTER — Ambulatory Visit: Attending: Cardiovascular Disease | Admitting: Cardiovascular Disease

## 2024-12-26 ENCOUNTER — Encounter: Payer: Self-pay | Admitting: Cardiovascular Disease

## 2024-12-26 VITALS — BP 122/68 | HR 61 | Ht 60.0 in | Wt 195.0 lb

## 2024-12-26 DIAGNOSIS — I10 Essential (primary) hypertension: Secondary | ICD-10-CM

## 2024-12-26 DIAGNOSIS — I639 Cerebral infarction, unspecified: Secondary | ICD-10-CM | POA: Diagnosis not present

## 2024-12-26 DIAGNOSIS — I351 Nonrheumatic aortic (valve) insufficiency: Secondary | ICD-10-CM

## 2024-12-26 DIAGNOSIS — I25119 Atherosclerotic heart disease of native coronary artery with unspecified angina pectoris: Secondary | ICD-10-CM | POA: Diagnosis not present

## 2024-12-26 NOTE — Patient Instructions (Signed)
 Medication Instructions:  Your physician recommends that you continue on your current medications as directed. Please refer to the Current Medication list given to you today.  *If you need a refill on your cardiac medications before your next appointment, please call your pharmacy*   Testing/Procedures: Your physician has requested that you have an echocardiogram. Echocardiography is a painless test that uses sound waves to create images of your heart. It provides your doctor with information about the size and shape of your heart and how well your heart's chambers and valves are working. This procedure takes approximately one hour. There are no restrictions for this procedure. Please do NOT wear cologne, perfume, aftershave, or lotions (deodorant is allowed). Please arrive 15 minutes prior to your appointment time.  Please note: We ask at that you not bring children with you during ultrasound (echo/ vascular) testing. Due to room size and safety concerns, children are not allowed in the ultrasound rooms during exams. Our front office staff cannot provide observation of children in our lobby area while testing is being conducted. An adult accompanying a patient to their appointment will only be allowed in the ultrasound room at the discretion of the ultrasound technician under special circumstances. We apologize for any inconvenience. **To do in November**   Follow-Up: At [facility], you and your health needs are our priority.  As part of our continuing mission to provide you with exceptional heart care, our providers are all part of one team.  This team includes your primary Cardiologist (physician) and Advanced Practice Providers or APPs (Physician Assistants and Nurse Practitioners) who all work together to provide you with the care you need, when you need it.  Your next appointment:   12 month(s)  Provider: Dorn Lesches, MD   We recommend signing up for the patient portal  called MyChart.  Sign up information is provided on this After Visit Summary.  MyChart is used to connect with patients for Virtual Visits (Telemedicine).  Patients are able to view lab/test results, encounter notes, upcoming appointments, etc.  Non-urgent messages can be sent to your provider as well.   To learn more about what you can do with MyChart, go to [email or web address].

## 2024-12-26 NOTE — Assessment & Plan Note (Signed)
 History of essential hypertension blood pressure measured today 122/68.  She is on amlodipine , olmesartan .

## 2024-12-26 NOTE — Progress Notes (Signed)
 "     12/26/2024 Catherine [name]   30-Oct-1956  [id number]  Primary Physician Jerome Heron Ruth, PA-C Primary Cardiologist: Dorn JINNY Lesches MD FACP, [place], [place], [place]  HPI:  Catherine [name] is a 69 y.o.   severely overweight divorced African-American female mother of 2, grandmother of 33 grandchildren referred by Dr. Jeronimo for cardiovascular valuation because of an abnormal Myoview stress test.  I last saw her in the office 01/10/2023.  She is retired from work at [employer] and [employer].  Her risk factors include treated hypertension and hyperlipidemia.  Her brother did die of a myocardial infarction in his 4s.  She is never had a heart attack or stroke.  She does complain of occasional atypical chest pain with radiation down her left arm.  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  Since I saw her 2 years ago she was admitted to the hospital 10/22/2024 with signs and symptoms of a stroke.  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  Her symptoms improved during her brief hospitalization.  She did have a 2D echocardiogram performed 10/23/2024 that showed normal LV systolic function, grade 1 diastolic dysfunction with moderate aortic insufficiency.  A 30-day event monitor showed occasional PACs, PVCs but no A-fib.  She gets occasional dyspnea on exertion when walking long distances.   Active Medications[1]   Allergies[2]  Social History   Socioeconomic History   Marital status: Single    Spouse name: Not on file   Number of children: 2   Years of education: 9   Highest education level: Not on file  Occupational History   Occupation: Disability    Comment: Heart Disease  Tobacco Use   Smoking status: Never   Smokeless tobacco: Never  Vaping Use   Vaping status: Never Used  Substance and Sexual Activity   Alcohol use: No   Drug use: No   Sexual activity:  Yes    Birth control/protection: Surgical  Other Topics Concern   Not on file  Social History Narrative   Lives with 68 yo son. Mother passed in 2018   Social Drivers of Health   Tobacco Use: Low Risk (10/22/2024)   Patient History    Smoking Tobacco Use: Never    Smokeless Tobacco Use: Never    Passive Exposure: Not on file  Financial Resource Strain: Not on file  Food Insecurity: Food Insecurity Present (10/23/2024)   Epic    Worried About Programme Researcher, Broadcasting/film/video in the Last Year: Sometimes true    [employer] of Food in the Last Year: Sometimes true  Transportation Needs: No Transportation Needs (10/23/2024)   Epic    Lack of Transportation (Medical): No    Lack of Transportation (Non-Medical): No  Physical Activity: Not on file  Stress: Not on file  Social Connections: Socially Isolated (10/23/2024)   Social Connection and Isolation Panel    Frequency of Communication with Friends and Family: Three times a week    Frequency of Social Gatherings with Friends and Family: Once a week    Attends Religious Services: Never    Database Administrator or Organizations: No    Attends Banker Meetings: Never    Marital Status: Divorced  Catering Manager Violence: Not At Risk (10/23/2024)   Epic    Fear of Current or Ex-Partner: No    Emotionally Abused: No  Physically Abused: No    Sexually Abused: No  Depression (PHQ2-9): Not on file  Alcohol Screen: Not on file  Housing: Low Risk (10/23/2024)   Epic    Unable to Pay for Housing in the Last Year: No    Number of Times Moved in the Last Year: 0    Homeless in the Last Year: No  Utilities: Not At Risk (10/23/2024)   Epic    Threatened with loss of utilities: No  Health Literacy: Not on file     Review of Systems: General: negative for chills, fever, night sweats or weight changes.  Cardiovascular: negative for chest pain, dyspnea on exertion, edema, orthopnea, palpitations, paroxysmal nocturnal dyspnea or shortness of  breath Dermatological: negative for rash Respiratory: negative for cough or wheezing Urologic: negative for hematuria Abdominal: negative for nausea, vomiting, diarrhea, bright red blood per rectum, melena, or hematemesis Neurologic: negative for visual changes, syncope, or dizziness All other systems reviewed and are otherwise negative except as noted above.    Blood pressure 122/68, pulse 61, height 5' (1.524 m), weight 195 lb (88.5 kg), SpO2 98%.  General appearance: alert and no distress Neck: no adenopathy, no carotid bruit, no JVD, supple, symmetrical, trachea midline, and thyroid  not enlarged, symmetric, no tenderness/mass/nodules Lungs: clear to auscultation bilaterally Heart: regular rate and rhythm, S1, S2 normal, no murmur, click, rub or gallop Extremities: extremities normal, atraumatic, no cyanosis or edema Pulses: 2+ and symmetric Skin: Skin color, texture, turgor normal. No rashes or lesions Neurologic: Grossly normal  EKG EKG Interpretation Date/Time:  Monday December 26 2024 08:20:07 EST Ventricular Rate:  61 PR Interval:  172 QRS Duration:  92 QT Interval:  376 QTC Calculation: 378 R Axis:   -4  Text Interpretation: Normal sinus rhythm Nonspecific T wave abnormality When compared with ECG of 17-Oct-2021 19:56, No significant change was found Confirmed by Court Carrier [phone]) on 12/26/2024 8:24:37 AM    ASSESSMENT AND PLAN:   HYPERLIPIDEMIA History of hyperlipidemia on statin therapy with lipid profile performed 10/24/2024 revealing total cholesterol 122, LDL 59 and HDL of 50.  She is at goal for secondary prevention.  Essential hypertension History of essential hypertension blood pressure measured today 122/68.  She is on amlodipine , olmesartan .  Coronary atherosclerosis History of CAD status post coronary CTA performed 01/05/2023 revealing a coronary calcium  score 135 with nonobstructive CAD.  She denies chest pain.  She is at goal for secondary prevention.   I did not think she required cardiac catheterization despite her mildly abnormal stress test.     Carrier DOROTHA Court MD [facility], FSCAI 12/26/2024 8:33 AM    [1]  Current Meds  Medication Sig   albuterol  (PROVENTIL  HFA;VENTOLIN  HFA) 108 (90 BASE) MCG/ACT inhaler Inhale 1-2 puffs into the lungs every 6 (six) hours as needed for wheezing or shortness of breath.   albuterol  (PROVENTIL ) (2.5 MG/3ML) 0.083% nebulizer solution Inhale 2.5 mg into the lungs every 4 (four) hours as needed for wheezing or shortness of breath. As needed   amLODipine -olmesartan  (AZOR ) 10-40 MG tablet Take 1 tablet by mouth daily.   aspirin  81 MG chewable tablet Chew 81 mg by mouth daily.   atenolol  (TENORMIN ) 50 MG tablet Take 50 mg by mouth daily.   Butalbital -APAP-Caffeine 50-300-40 MG CAPS Take 1 capsule by mouth 2 (two) times daily as needed.   Cholecalciferol (VITAMIN D PO) Take 1 tablet by mouth daily.   clonazePAM  (KLONOPIN ) 0.5 MG tablet Take 0.5 mg by mouth in the morning, at noon,  and at bedtime.   clopidogrel  (PLAVIX ) 75 MG tablet Take 1 tablet (75 mg total) by mouth daily.   linaclotide  (LINZESS ) 145 MCG CAPS capsule Take 1 capsule (145 mcg total) by mouth daily before breakfast.   meclizine (ANTIVERT) 25 MG tablet Take 1 tablet by mouth 2 (two) times daily as needed.   Multiple Vitamins-Minerals (EYE SUPPORT PO) Take 1 tablet by mouth daily.   MYRBETRIQ 50 MG TB24 tablet Take 50 mg by mouth daily.   naloxone (NARCAN) 4 MG/0.1ML LIQD nasal spray kit 1 spray.   oxyCODONE -acetaminophen  (PERCOCET) 10-325 MG tablet Take 1 tablet by mouth 2 (two) times daily as needed.   Peppermint Oil (IBGARD) 90 MG CPCR Take 2 capsules by mouth daily. Take 30-90 minutes before meals with water (Patient taking differently: Take 2 capsules by mouth daily as needed (stomach). Take 30-90 minutes before meals with water)   potassium citrate (UROCIT-K) 10 MEQ (1080 MG) SR tablet Take 10 mEq by mouth daily.   QULIPTA  30 MG  TABS Take 1 tablet by mouth daily.   rosuvastatin  (CRESTOR ) 20 MG tablet Take 20 mg by mouth daily.   SYMBICORT  80-4.5 MCG/ACT inhaler Use 2 puffs TWICE DAILY (120/4=30)   vitamin B-12 (CYANOCOBALAMIN ) 1000 MCG tablet Take 1 tablet (1,000 mcg total) by mouth daily.  [2]  Allergies Allergen Reactions   Other Nausea Only and Other (See Comments)    Darvocet   Azithromycin  Nausea And Vomiting   Ciprofloxacin Itching   "

## 2024-12-26 NOTE — Assessment & Plan Note (Signed)
 History of hyperlipidemia on statin therapy with lipid profile performed 10/24/2024 revealing total cholesterol 122, LDL 59 and HDL of 50.  She is at goal for secondary prevention.

## 2024-12-26 NOTE — Assessment & Plan Note (Signed)
 History of CAD status post coronary CTA performed 01/05/2023 revealing a coronary calcium  score 135 with nonobstructive CAD.  She denies chest pain.  She is at goal for secondary prevention.  I did not think she required cardiac catheterization despite her mildly abnormal stress test.

## 2025-01-17 ENCOUNTER — Other Ambulatory Visit (HOSPITAL_BASED_OUTPATIENT_CLINIC_OR_DEPARTMENT_OTHER): Payer: Self-pay

## 2025-01-17 ENCOUNTER — Telehealth (HOSPITAL_BASED_OUTPATIENT_CLINIC_OR_DEPARTMENT_OTHER): Payer: Self-pay | Admitting: Cardiovascular Disease

## 2025-01-17 MED ORDER — CLOPIDOGREL BISULFATE 75 MG PO TABS
75.0000 mg | ORAL_TABLET | Freq: Every day | ORAL | 3 refills | Status: DC
  Filled 2025-01-17: qty 90, 90d supply, fill #0

## 2025-01-17 NOTE — Telephone Encounter (Signed)
" °*  STAT* If patient is at the pharmacy, call can be transferred to refill team.   1. Which medications need to be refilled? (please list name of each medication and dose if known) clopidogrel  75 mg (plavix )    "

## 2025-01-17 NOTE — Telephone Encounter (Signed)
 Refill sent

## 2025-01-19 MED ORDER — CLOPIDOGREL BISULFATE 75 MG PO TABS: 75.0000 mg | ORAL_TABLET | Freq: Every day | ORAL | 3 refills | Status: AC

## 2025-01-19 NOTE — Addendum Note (Signed)
 Addended by: DARIO EARING L on: 01/19/2025 11:00 AM   Modules accepted: Orders
